# Patient Record
Sex: Female | Born: 1965 | Race: White | Hispanic: No | Smoking: Never smoker
Health system: Southern US, Community
[De-identification: ages and names within clinical notes are randomized; demographics above are authoritative.]

## PROBLEM LIST (undated history)

## (undated) DIAGNOSIS — F419 Anxiety disorder, unspecified: Secondary | ICD-10-CM

## (undated) DIAGNOSIS — K5792 Diverticulitis of intestine, part unspecified, without perforation or abscess without bleeding: Secondary | ICD-10-CM

## (undated) DIAGNOSIS — J189 Pneumonia, unspecified organism: Secondary | ICD-10-CM

## (undated) DIAGNOSIS — T7840XA Allergy, unspecified, initial encounter: Secondary | ICD-10-CM

## (undated) DIAGNOSIS — R0602 Shortness of breath: Secondary | ICD-10-CM

## (undated) DIAGNOSIS — E119 Type 2 diabetes mellitus without complications: Secondary | ICD-10-CM

## (undated) DIAGNOSIS — Z8719 Personal history of other diseases of the digestive system: Secondary | ICD-10-CM

## (undated) DIAGNOSIS — K760 Fatty (change of) liver, not elsewhere classified: Secondary | ICD-10-CM

## (undated) DIAGNOSIS — R0600 Dyspnea, unspecified: Secondary | ICD-10-CM

## (undated) DIAGNOSIS — R030 Elevated blood-pressure reading, without diagnosis of hypertension: Secondary | ICD-10-CM

## (undated) DIAGNOSIS — D649 Anemia, unspecified: Secondary | ICD-10-CM

## (undated) DIAGNOSIS — R609 Edema, unspecified: Secondary | ICD-10-CM

## (undated) DIAGNOSIS — R112 Nausea with vomiting, unspecified: Secondary | ICD-10-CM

## (undated) DIAGNOSIS — M1990 Unspecified osteoarthritis, unspecified site: Secondary | ICD-10-CM

## (undated) DIAGNOSIS — M5136 Other intervertebral disc degeneration, lumbar region: Secondary | ICD-10-CM

## (undated) DIAGNOSIS — M51369 Other intervertebral disc degeneration, lumbar region without mention of lumbar back pain or lower extremity pain: Secondary | ICD-10-CM

## (undated) DIAGNOSIS — I10 Essential (primary) hypertension: Secondary | ICD-10-CM

## (undated) DIAGNOSIS — R519 Headache, unspecified: Secondary | ICD-10-CM

## (undated) DIAGNOSIS — Z8489 Family history of other specified conditions: Secondary | ICD-10-CM

## (undated) DIAGNOSIS — Z9889 Other specified postprocedural states: Secondary | ICD-10-CM

## (undated) DIAGNOSIS — K219 Gastro-esophageal reflux disease without esophagitis: Secondary | ICD-10-CM

## (undated) DIAGNOSIS — T4145XA Adverse effect of unspecified anesthetic, initial encounter: Secondary | ICD-10-CM

## (undated) DIAGNOSIS — E785 Hyperlipidemia, unspecified: Secondary | ICD-10-CM

## (undated) DIAGNOSIS — T8859XA Other complications of anesthesia, initial encounter: Secondary | ICD-10-CM

## (undated) HISTORY — DX: Edema, unspecified: R60.9

## (undated) HISTORY — PX: ABDOMINAL HYSTERECTOMY: SHX81

## (undated) HISTORY — DX: Type 2 diabetes mellitus without complications: E11.9

## (undated) HISTORY — DX: Elevated blood-pressure reading, without diagnosis of hypertension: R03.0

## (undated) HISTORY — DX: Diverticulitis of intestine, part unspecified, without perforation or abscess without bleeding: K57.92

## (undated) HISTORY — DX: Anemia, unspecified: D64.9

## (undated) HISTORY — DX: Unspecified osteoarthritis, unspecified site: M19.90

## (undated) HISTORY — DX: Anxiety disorder, unspecified: F41.9

## (undated) HISTORY — PX: TUBAL LIGATION: SHX77

## (undated) HISTORY — DX: Shortness of breath: R06.02

## (undated) HISTORY — PX: BREAST LUMPECTOMY: SHX2

## (undated) HISTORY — DX: Hyperlipidemia, unspecified: E78.5

## (undated) HISTORY — DX: Allergy, unspecified, initial encounter: T78.40XA

---
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
## 2003-08-17 ENCOUNTER — Emergency Department (HOSPITAL_COMMUNITY): Admission: EM | Admit: 2003-08-17 | Discharge: 2003-08-17 | Payer: Self-pay | Admitting: Emergency Medicine

## 2008-07-02 ENCOUNTER — Emergency Department (HOSPITAL_COMMUNITY): Admission: EM | Admit: 2008-07-02 | Discharge: 2008-07-02 | Payer: Self-pay | Admitting: Emergency Medicine

## 2008-07-08 ENCOUNTER — Ambulatory Visit: Payer: Self-pay | Admitting: Cardiovascular Disease

## 2008-07-28 ENCOUNTER — Ambulatory Visit: Payer: Self-pay

## 2008-07-28 ENCOUNTER — Encounter: Payer: Self-pay | Admitting: Cardiovascular Disease

## 2009-12-30 ENCOUNTER — Telehealth: Payer: Self-pay | Admitting: Cardiovascular Disease

## 2010-01-04 ENCOUNTER — Encounter: Payer: Self-pay | Admitting: Cardiovascular Disease

## 2010-01-24 ENCOUNTER — Encounter: Payer: Self-pay | Admitting: Physician Assistant

## 2010-01-24 ENCOUNTER — Ambulatory Visit: Payer: Self-pay | Admitting: Cardiology

## 2010-01-24 DIAGNOSIS — R079 Chest pain, unspecified: Secondary | ICD-10-CM

## 2010-01-24 DIAGNOSIS — R0600 Dyspnea, unspecified: Secondary | ICD-10-CM | POA: Insufficient documentation

## 2010-01-24 DIAGNOSIS — R0789 Other chest pain: Secondary | ICD-10-CM | POA: Insufficient documentation

## 2010-01-24 DIAGNOSIS — R609 Edema, unspecified: Secondary | ICD-10-CM | POA: Insufficient documentation

## 2010-01-24 DIAGNOSIS — R0609 Other forms of dyspnea: Secondary | ICD-10-CM | POA: Insufficient documentation

## 2010-01-24 DIAGNOSIS — R0602 Shortness of breath: Secondary | ICD-10-CM

## 2010-01-26 ENCOUNTER — Telehealth (INDEPENDENT_AMBULATORY_CARE_PROVIDER_SITE_OTHER): Payer: Self-pay | Admitting: *Deleted

## 2010-03-07 ENCOUNTER — Ambulatory Visit: Payer: Self-pay

## 2010-03-07 ENCOUNTER — Ambulatory Visit (HOSPITAL_COMMUNITY): Admission: RE | Admit: 2010-03-07 | Discharge: 2010-03-07 | Payer: Self-pay | Admitting: Cardiovascular Disease

## 2010-03-07 ENCOUNTER — Ambulatory Visit: Payer: Self-pay | Admitting: Cardiology

## 2010-03-08 ENCOUNTER — Encounter: Payer: Self-pay | Admitting: Cardiology

## 2010-03-13 ENCOUNTER — Encounter: Payer: Self-pay | Admitting: Pulmonary Disease

## 2010-03-16 ENCOUNTER — Ambulatory Visit: Payer: Self-pay | Admitting: Cardiovascular Disease

## 2010-03-27 ENCOUNTER — Encounter: Payer: Self-pay | Admitting: Pulmonary Disease

## 2010-04-02 ENCOUNTER — Encounter: Payer: Self-pay | Admitting: Pulmonary Disease

## 2010-04-17 ENCOUNTER — Emergency Department (HOSPITAL_COMMUNITY): Admission: EM | Admit: 2010-04-17 | Discharge: 2010-04-17 | Payer: Self-pay | Admitting: Emergency Medicine

## 2010-04-18 ENCOUNTER — Ambulatory Visit: Payer: Self-pay | Admitting: Pulmonary Disease

## 2010-04-18 DIAGNOSIS — R05 Cough: Secondary | ICD-10-CM | POA: Insufficient documentation

## 2010-04-18 DIAGNOSIS — R059 Cough, unspecified: Secondary | ICD-10-CM | POA: Insufficient documentation

## 2010-04-27 ENCOUNTER — Encounter: Admission: RE | Admit: 2010-04-27 | Discharge: 2010-04-27 | Payer: Self-pay | Admitting: General Surgery

## 2010-04-27 ENCOUNTER — Ambulatory Visit (HOSPITAL_COMMUNITY): Admission: RE | Admit: 2010-04-27 | Discharge: 2010-04-27 | Payer: Self-pay | Admitting: General Surgery

## 2010-07-11 NOTE — Assessment & Plan Note (Signed)
Summary: cough/jd   Visit Type:  Initial Consult Copy to:  pcp Primary Provider/Referring Provider:  [facility], Dr. Shea Evans  CC:  Pulmonary consult. Dx with PNA in Sept 2011.  History of Present Illness: [name] , never smoker for evaluation of sub acute cough since sep'11. Started with fever malaise in sep '11 , treated for pneumonia sep 6 /11 D- dimer pos, CT angio neg [facility]  Sep 14 th - short course of steroids  CXR 10/3 /11 LLL infiltrate that resolved on CXR 03/27/10 after taking doxy, levaquin -I also reviewed CXR on ER visit 11/7 /11 which is clear. c/o pain in interscapular area from incessant coughing, c/o substernal & epigastric pain radiating to lUE , ER visit 11/7 - enzymes neg, recent stres test neg (dr Eden Emms)- Asked to tak eprilosec & zantac. PPD neg 03/19/10 Hycodan syrup helps, but keeps her sleepy. Tessalo has not helped. Advair two times a day - no differnece, pro- air makes her cough Rpt spirometry 03/27/10 showed mild restriction FVC 77%, FEv1 78%, no obstruction  - improved form 03/13/10 Is scheduled for breast biopsy in 2 weeks  Preventive Screening-Counseling & Management  Alcohol-Tobacco     Alcohol drinks/day: 0     Smoking Status: never  Current Medications (verified): 1)  Hydrochlorothiazide 25 Mg Tabs (Hydrochlorothiazide) .... Take One  Tablet By Mouth Daily 2)  Alprazolam 0.25 Mg Tabs (Alprazolam) .... Take 1/2 Tablet Daily As Needed 3)  Advair Hfa 230-21 Mcg/act Aero (Fluticasone-Salmeterol) .... As Directed 4)  Albuterol Sulfate (2.5 Mg/68ml) 0.083% Nebu (Albuterol Sulfate) .... As Directed 5)  Cyclobenzaprine Hcl 5 Mg Tabs (Cyclobenzaprine Hcl) .Marland Kitchen.. 1 Tab By Mouth Three Times A Day As Needed 6)  Advil 200 Mg Tabs (Ibuprofen) .... As Needed  Allergies (verified): No Known Drug Allergies  Past History:  Past Medical History: Last updated: 03/16/2010 CHEST PAIN SHORTNESS OF BREATH EDEMA    Past medical history is remarkable for  previous history of chest pain,   some borderline hypertension, lower extremity edema on diuretics,   headaches.   Past Surgical History: Last updated: 01/19/2010  Previous surgeries have included tubal ligation and 3 lumps removed, two   from the right breast and one from the left.  [name].   Social History: she is currently working in a position that it will be eliminated in October. She is a nonsmoker. Marital Status: Divorced Children: yes 3 Occupation: Conservator, museum/gallery for [employer] Patient never smoked.  Smoking Status:  never Alcohol drinks/day:  0  Review of Systems       The patient complains of shortness of breath with activity, productive cough, non-productive cough, chest pain, acid heartburn, indigestion, weight change, abdominal pain, tooth/dental problems, headaches, anxiety, hand/feet swelling, joint stiffness or pain, and change in color of mucus.  The patient denies shortness of breath at rest, coughing up blood, irregular heartbeats, loss of appetite, difficulty swallowing, sore throat, nasal congestion/difficulty breathing through nose, sneezing, itching, ear ache, depression, rash, and fever.    Vital Signs:  Patient profile:   45 year old female Height:      67 inches Weight:      201.75 pounds O2 Sat:      98 % on Room air Temp:     98.6 degrees F oral Pulse rate:   96 / minute BP sitting:   100 / 70  (left arm) Cuff size:   large  Vitals  Entered By: Zackery Barefoot CMA (April 18, 2010 3:40 PM)  O2 Flow:  Room air CC: Pulmonary consult. Dx with PNA in Sept 2011 Comments Medications reviewed with patient Verified contact number and pharmacy with patient Zackery Barefoot CMA  April 18, 2010 3:50 PM    Physical Exam  Additional Exam:  Gen. Pleasant, well-nourished, in no distress, normal affect ENT - no lesions, no post nasal drip Neck: No JVD, no thyromegaly, no carotid bruits Lungs: no use of  accessory muscles, no dullness to percussion, coarse BS without rales or rhonchi  Cardiovascular: Rhythm regular, heart sounds  normal, no murmurs or gallops, no peripheral edema Abdomen: soft and non-tender, no hepatosplenomegaly, BS normal. Musculoskeletal: No deformities, no cyanosis or clubbing Neuro:  alert, non focal     Impression & Recommendations:  Problem # 1:  COUGH (ICD-786.2)  Post bronchitic/ pneumonic Expect to resolve within 4-6 wks Symptomatic suppression - Take hydrocodone syrup at bedtime , DELSYM daytime ( DM containing syrup) Tessalon ok Pro-air pump as needed for wheezing OK to stop advair  short course of prednisone Take prilosec once daily x 6 wks to suppress GERD  Orders: Consultation Level III (16109) Prescription Created Electronically [phone])  Medications Added to Medication List This Visit: 1)  Alprazolam 0.25 Mg Tabs (Alprazolam) .... Take 1/2 tablet daily as needed 2)  Advil 200 Mg Tabs (Ibuprofen) .... As needed 3)  Prednisone 5 Mg Tabs (Prednisone) .... Take 2 tabs once daily x 5 ds, then 1 tab once daily x 5  Other Orders: Admin 1st Vaccine (09811) Flu Vaccine 8yrs + (91478)  Patient Instructions: 1)  Copy sent to: Dr Shea Evans [facility] [place] [facility] 2)  Take hydrocodone syrup at bedtime , DELSYM daytime ( DM containing syrup) 3)  Tessalon ok 4)  Pro-air pump as needed for wheezing 5)  OK to stop advair 6)   short course of prednisone 7)  Take prilosec once daily x 6 wks 8)  Please schedule a follow-up appointment as needed. Prescriptions: PREDNISONE 5 MG TABS (PREDNISONE) Take 2 tabs once daily x 5 ds, then 1 tab once daily x 5  #15 x 0   Entered and Authorized by:   Comer Locket Vassie Loll MD   Signed by:   Comer Locket Vassie Loll MD on 04/18/2010   Method used:   Print then Give to Patient   RxID:   [id number]                  Flu Vaccine Consent Questions     Do you have a history of severe  allergic reactions to this vaccine? no    Any prior history of allergic reactions to egg and/or gelatin? no    Do you have a sensitivity to the preservative Thimersol? no    Do you have a past history of Guillan-Barre Syndrome? no    Do you currently have an acute febrile illness? no    Have you ever had a severe reaction to latex? no    Vaccine information given and explained to patient? yes    Are you currently pregnant? no    Lot Number:[id number]   Exp Date:12/09/2010   Site Given  Left Deltoid IMflu1 Zackery Barefoot CMA  April 18, 2010 5:07 PM

## 2010-07-11 NOTE — Progress Notes (Signed)
Summary: c/o edema in both legs, sob  Phone Note Call from Patient Call back at Home Phone [phone] Call back at [phone]   Caller: Patient Reason for Call: Talk to Nurse Summary of Call: per pt calling, c/o edema in both legs, right leg is worse. sob. pt asking could she be seen today if possible since pn is not in office today.  Initial call taken by: Lorne Skeens,  December 30, 2009 8:51 AM  Follow-up for Phone Call        Spoke with pt. Patient c/o of increased swelling on lower extremities for the last 2 days. Before, she had some edema which went down after elevaling her feet in the afternoon after work. Now, the swelling does not go down and she push  down with her finges the indentation stays for a while. She also c/o of sob with and without activity. Also some tightness of chest which is not new.Pt. states has gone to the hospital for the chest tightness was given NTG it did not help because it was not heart related. Pt states is not taken any prescrived medication. Pt. only takes Advil or tylenol as needed.  Ollen Gross, RN, BSN  December 30, 2009 9:31 AM  Dr. Daleen Squibb DOD recommeds to start pt. on HCTZ 12.5 mg daily and to have a potassium rich diet. Pt. to be f/u by primary cardiologist. [facility] [place]. PT. has an appointment with the PA on August 16th at 9:15 AM. Pt aware of MD's recommendations and appointment.    Follow-up by: Ollen Gross, RN, BSN,  December 30, 2009 10:17 AM  Additional Follow-up for Phone Call Additional follow up Details #1::         Reviewed Juanito Doom, MD  Additional Follow-up by: Gaylord Shih, MD, [facility],  December 30, 2009 1:16 PM    New/Updated Medications: HYDROCHLOROTHIAZIDE 25 MG TABS (HYDROCHLOROTHIAZIDE) Take 1/2 tablet by mouth daily Prescriptions: HYDROCHLOROTHIAZIDE 25 MG TABS (HYDROCHLOROTHIAZIDE) Take 1/2 tablet by mouth daily  #15 x 5   Entered by:   Ollen Gross, RN, BSN   Authorized by:   Gaylord Shih,  MD, [facility]   Signed by:   Ollen Gross, RN, BSN on 12/30/2009   Method used:   Faxed to ...       [facility] (retail[facility]       [address]       [place], [place]  [address]       Ph: [phone]       Fax: [phone]   RxID:   [id number]   Appended Document: c/o edema in both legs, sob  Reviewed Juanito Doom, MD  Appended Document: c/o edema in both legs, sob Dr Chilton Si from Urgent Care call today reporting that the patient was having continued shortness of breath and chest discomfort. It does not sound anginal. Chest x-ray and EKG are normal. Cardiac marker is pending. D-dimer is pending. She has lost about 8 pounds of fluid with the HCTZ.  I advised him to check cardiac markers since she's had constant pain for several days. He will also check a d-dimer. We have her scheduled for followup in the office as mentioned above.

## 2010-07-11 NOTE — Progress Notes (Signed)
  ROI faxed to Urgent [facility] [place], Records recieved back today gave to Hosie Poisson Mesiemore  January 26, 2010 2:34 PM     Appended Document:  gave Records to [facility]

## 2010-07-11 NOTE — Assessment & Plan Note (Signed)
Summary: per check out/having stress echo @ 2pm/saf   CC:  dizziness and sob pt is being treated for pnemonia so symptoms may be related to that.  History of Present Illness: Angelica Sharp was seen by our PA Jacolyn Reedy recently.  I last saw her in 2010 for atypical pain with negative w/U.  She just had a normal stress echo that I reviewed.  She has had chronic edema that she takes a diuretic for.  She has had a recent URI with asthma like flare.  This seems to be improveing with albuterol and antibiotic Rx.  She has cut back on her salt intake and this has helped her edema a lot.  She kept her job at [employer] but now is in [employer].  Her daughter had her baby 3 weeks early but all are doing well and this is a big relief for her as well  Current Problems (verified): 1)  Chest Pain  (ICD-786.50) 2)  Shortness of Breath  (ICD-786.05) 3)  Edema  (ICD-782.3)  Current Medications (verified): 1)  Hydrochlorothiazide 25 Mg Tabs (Hydrochlorothiazide) .... Take One  Tablet By Mouth Daily 2)  Alprazolam 0.25 Mg Tabs (Alprazolam) .... Take 1/2 Tablet Daily 3)  Advair Hfa 230-21 Mcg/act Aero (Fluticasone-Salmeterol) .... As Directed 4)  Albuterol Sulfate (2.5 Mg/55ml) 0.083% Nebu (Albuterol Sulfate) .... As Directed 5)  Meloxicam 7.5 Mg Tabs (Meloxicam) .Marland Kitchen.. 1 Tab By Mouth Two Times A Day 6)  Cyclobenzaprine Hcl 5 Mg Tabs (Cyclobenzaprine Hcl) .Marland Kitchen.. 1 Tab By Mouth Three Times A Day As Needed  Allergies (verified): No Known Drug Allergies  Past History:  Past Medical History: Last updated: 03/16/2010 CHEST PAIN SHORTNESS OF BREATH EDEMA    Past medical history is remarkable for previous history of chest pain,   some borderline hypertension, lower extremity edema on diuretics,   headaches.   Past Surgical History: Last updated: 01/19/2010  Previous surgeries have included tubal ligation and 3 lumps removed, two   from the right breast and one from the left.  She is due to have  a   followup mammogram in March.   Family History: Last updated: 01/24/2010 Mother had congestive heart failure and died of this she was also diabetic Sister has congestive heart failure and diabetes  Social History: Last updated: 01/24/2010 she is currently working in a position that it will be eliminated in October. She is a nonsmoker.  Review of Systems       Denies fever, malais, weight loss, blurry vision, decreased visual acuity, cough, sputum, SOB, hemoptysis, pleuritic pain, palpitaitons, heartburn, abdominal pain, melena, lower extremity edema, claudication, or rash.   Vital Signs:  Patient profile:   45 year old female Height:      67 inches Weight:      205 pounds BMI:     32.22 Pulse rate:   80 / minute Resp:     18 per minute BP sitting:   102 / 60  (left arm)  Vitals Entered By: Kem Parkinson (March 16, 2010 11:56 AM)  Physical Exam  General:  Affect appropriate Healthy:  appears stated age HEENT: normal Neck supple with no adenopathy JVP normal no bruits no thyromegaly Lungs clear with no wheezing and good diaphragmatic motion Heart:  S1/S2 no murmur,rub, gallop or click PMI normal Abdomen: benighn, BS positve, no tenderness, no AAA no bruit.  No HSM or HJR Distal pulses intact with no bruits No edema Neuro non-focal Skin warm and dry  Impression & Recommendations:  Problem # 1:  CHEST PAIN (ICD-786.50) Atypical with normal stress echo.  No need for further w/u  Problem # 2:  SHORTNESS OF BREATH (ICD-786.05) URI with asthmatic bronchitis  F/U primary Her updated medication list for this problem includes:    Hydrochlorothiazide 25 Mg Tabs (Hydrochlorothiazide) .Marland Kitchen... Take one  tablet by mouth daily  Problem # 3:  EDEMA (ICD-782.3) Cotninue low sodium diet and low dose diuretic.  Patient Instructions: 1)  Your physician recommends that you schedule a follow-up appointment in: YEAR WITH DR Eden Emms 2)  Your physician recommends that you  continue on your current medications as directed. Please refer to the Current Medication list given to you today.

## 2010-07-11 NOTE — Assessment & Plan Note (Signed)
Summary: Increase pitting edema in LE, sob./nm   Visit Type:  follow up  CC:  Edema and sob.  History of Present Illness: This is a 45 year old female patient who went to urgent care with constant chest tightness. We're trying to obtain the records but do not have them. The patient states that she awakened with chest tightness that was constant and one call way. She says they did cardiac enzymes that were negative and she was given Xanax for stress. The patient also complains of dyspnea on exertion when she walks 3 miles a week. She is no history of hypertension diabetes and is a nonsmoker. Her mother and sister both had congestive heart failure and diabetes.  The patient says a lot of her chest pain may be stress related. She is going to lose her job in October and her daughter is having premature labor. She is feeling a little bit better on the Xanax. She was also given hydrochlorothiazide for edema. She says it's not enough and she continues to have swelling in her ankles and abdomen.  Current Medications (verified): 1)  Hydrochlorothiazide 25 Mg Tabs (Hydrochlorothiazide) .... Take 1/2 Tablet By Mouth Daily 2)  Omeprazole 20 Mg Tbec (Omeprazole) .... Take 1 Tablet By Mouth Once A Day 3)  Alprazolam 0.25 Mg Tabs (Alprazolam) .... Take 1/2 Tablet Daily  Allergies (verified): No Known Drug Allergies  Past History:  Past Medical History: Last updated: 01/19/2010  Past medical history is remarkable for previous history of chest pain,   some borderline hypertension, lower extremity edema on diuretics,   headaches.   Family History: Mother had congestive heart failure and died of this she was also diabetic Sister has congestive heart failure and diabetes  Social History: she is currently working in a position that it will be eliminated in October. She is a nonsmoker.  Review of Systems       see history of present illness  Vital Signs:  Patient profile:   45 year old  female Height:      67 inches Weight:      206 pounds BMI:     32.38 Pulse rate:   69 / minute Pulse rhythm:   regular Resp:     18 per minute BP sitting:   112 / 72  (left arm) Cuff size:   large  Vitals Entered By: Vikki Ports (January 24, 2010 10:04 AM)  Physical Exam  General:   Well-nournished, in no acute distress. Neck: No JVD, HJR, Bruit, or thyroid enlargement Lungs: No tachypnea, clear without wheezing, rales, or rhonchi Cardiovascular: RRR, PMI not displaced, heart sounds normal, no murmurs, gallops, bruit, thrill, or heave. Abdomen: BS normal. Soft without organomegaly, masses, lesions or tenderness. Extremities: Trace of bilateral ankle edema, lower extremities without cyanosis, clubbing. Good distal pulses bilateral SKin: Warm, no lesions or rashes  Musculoskeletal: No deformities Neuro: no focal signs    EKG  Procedure date:  01/24/2010  Findings:      normal sinus rhythm with LVH  Impression & Recommendations:  Problem # 1:  CHEST PAIN (ICD-786.50) Patient has symptoms of atypical chest pain but does have some cardiac risk factors. She's had a prior stress echo in our office in the past which was normal in 2010. We will repeat this. Orders: Stress Echo (Stress Echo)  Problem # 2:  EDEMA (ICD-782.3) she has some ankle edema. I will increase her hydrochlorothiazide to 25 mg daily. I've also asked her to follow 2 g sodium diet. We  will check a 2-D echo to rule out diastolic dysfunction. Orders: EKG w/ Interpretation (93000)  Problem # 3:  SHORTNESS OF BREATH (ICD-786.05) patient has dyspnea on exertion. Will check a stress echo. Her updated medication list for this problem includes:    Hydrochlorothiazide 25 Mg Tabs (Hydrochlorothiazide) .Marland Kitchen... Take one  tablet by mouth daily  Orders: EKG w/ Interpretation (93000) Stress Echo (Stress Echo)  Patient Instructions: 1)  Your physician recommends that you schedule a follow-up appointment in: 1 month with  Dr. Eden Emms 2)  Your physician has requested that you have a stress echocardiogram. For further information please visit [email or web address]  Please follow instruction sheet as given. 3)  Your physician has requested that you limit the intake of sodium (salt) in your diet to two grams daily. Please see [facility] handout. 4)  Your physician has recommended you make the following change in your medication: HCTZ 25 mg take one tablet daily Prescriptions: HYDROCHLOROTHIAZIDE 25 MG TABS (HYDROCHLOROTHIAZIDE) Take one  tablet by mouth daily  #30 x 6   Entered by:   Ollen Gross, RN, BSN   Authorized by:   Marletta Lor, PA-C   Signed by:   Ollen Gross, RN, BSN on 01/24/2010   Method used:   Print then Give to Patient   RxID:   [id number]

## 2010-07-11 NOTE — Letter (Signed)
Summary: [facility]  [facility]   Imported By: Marylou Mccoy 03/16/2010 15:15:24  _____________________________________________________________________  External Attachment:    Type:   Image     Comment:   External Document

## 2010-08-22 LAB — SURGICAL PCR SCREEN
MRSA, PCR: NEGATIVE
Staphylococcus aureus: NEGATIVE

## 2010-08-22 LAB — DIFFERENTIAL
Basophils Relative: 1 % (ref 0–1)
Lymphocytes Relative: 17 % (ref 12–46)
Lymphs Abs: 1.9 10*3/uL (ref 0.7–4.0)
Monocytes Relative: 5 % (ref 3–12)
Monocytes Relative: 6 % (ref 3–12)
Neutro Abs: 6.7 10*3/uL (ref 1.7–7.7)
Neutro Abs: 8.3 10*3/uL — ABNORMAL HIGH (ref 1.7–7.7)
Neutrophils Relative %: 66 % (ref 43–77)
Neutrophils Relative %: 76 % (ref 43–77)

## 2010-08-22 LAB — BASIC METABOLIC PANEL
Calcium: 8.9 mg/dL (ref 8.4–10.5)
Calcium: 9.2 mg/dL (ref 8.4–10.5)
GFR calc Af Amer: >60 mL/min (ref >60)
GFR calc Af Amer: >60 mL/min (ref >60)
GFR calc non Af Amer: >60 mL/min (ref >60)
GFR calc non Af Amer: >60 mL/min (ref >60)
Glucose, Bld: 87 mg/dL (ref 70–99)
Sodium: 136 mEq/L (ref 135–145)
Sodium: 137 mEq/L (ref 135–145)

## 2010-08-22 LAB — POCT CARDIAC MARKERS
CKMB, poc: <1 ng/mL — ABNORMAL LOW (ref 1.0–8.0)
Myoglobin, poc: 61.1 ng/mL (ref 12–200)

## 2010-08-22 LAB — CBC
Hemoglobin: 13.4 g/dL (ref 12.0–15.0)
Hemoglobin: 14.2 g/dL (ref 12.0–15.0)
MCHC: 32.8 g/dL (ref 30.0–36.0)
RBC: 4.82 MIL/uL (ref 3.87–5.11)
RBC: 5.03 MIL/uL (ref 3.87–5.11)

## 2010-09-25 LAB — CBC
HCT: 39.4 % (ref 36.0–46.0)
Platelets: 355 10*3/uL (ref 150–400)
WBC: 12 10*3/uL — ABNORMAL HIGH (ref 4.0–10.5)

## 2010-09-25 LAB — DIFFERENTIAL
Basophils Absolute: 0.1 10*3/uL (ref 0.0–0.1)
Basophils Relative: 1 % (ref 0–1)
Eosinophils Relative: 1 % (ref 0–5)
Monocytes Absolute: 0.4 10*3/uL (ref 0.1–1.0)

## 2010-09-25 LAB — COMPREHENSIVE METABOLIC PANEL
ALT: 17 U/L (ref 0–35)
AST: 19 U/L (ref 0–37)
Albumin: 3.6 g/dL (ref 3.5–5.2)
Alkaline Phosphatase: 77 U/L (ref 39–117)
Chloride: 105 mEq/L (ref 96–112)
GFR calc Af Amer: >60 mL/min (ref >60)
Potassium: 4 mEq/L (ref 3.5–5.1)
Sodium: 137 mEq/L (ref 135–145)
Total Bilirubin: 0.6 mg/dL (ref 0.3–1.2)

## 2010-09-25 LAB — POCT CARDIAC MARKERS
CKMB, poc: <1 ng/mL — ABNORMAL LOW (ref 1.0–8.0)
Myoglobin, poc: 43 ng/mL (ref 12–200)
Troponin i, poc: <0.05 ng/mL (ref 0.00–0.09)

## 2010-10-24 NOTE — Assessment & Plan Note (Signed)
[facility]                            CARDIOLOGY OFFICE NOTE   Angelica Sharp, Angelica Sharp                      MRN:          [id number]  DATE:07/08/2008                            DOB:          11/28/1965    A 45 year old patient referred from Dr. Denton Lank in the ER for recurrent  chest pain.  The patient has had previous chest pain back in 2003.  She  describes seeing a cardiologist in [facility] and having a negative  stress test.  She has been under a lot of stress lately.  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  The end of the month tends to be a  busy time.  On Friday, she was entering data at her computer and had  substernal chest pain.  It was in the center of her chest.  It was very  strong.  It lasted for a few hours.  There was partial relief with  nitro, GI cocktail, and Vicodin.  She was seen in the emergency room,  ruled out for myocardial infarction, was discharged for outpatient  followup.  She has had recurrent pain since Friday.  It has been  intermittent.  It has been nonexertional.  It has not been as tense.  The pain is described as sharp, sometimes radiating towards the back.  She had a normal chest x-ray in the ER with no mediastinal widening.  She had some pain back in November and had a CT scan at [facility] which showed no PE and no mediastinal abnormalities.   The patient is otherwise fairly sedentary.  She is to be active playing  tennis, but has arthritis in her back and this has limited her.   Her review of systems is remarkable for no significant syncope,  palpitations, diaphoresis, shortness of breath, or lower extremity  edema.   Past medical history is remarkable for previous history of chest pain,  some borderline hypertension, lower extremity edema on diuretics,  headaches.   Blood pressure has been high since 2006.   Previous surgeries have included tubal ligation and 3 lumps removed, two  from the right breast and one from the left.  She is due to have a  followup mammogram in March.   The patient is divorced.  He has 3 children.  Her youngest son is still  living with her.  She has a daughter was going to be married in June and  another daughter who is living with a boyfriend.  She does not  particularly care for.  She spends a lot of time with her 2  grandchildren.  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  She used to play tennis, but activities are now  limited by lower back pain.  She has 2 cups of coffee a day.  She does  not smoke or drink on a regular basis.   Her family history is remarkable for mother still being alive at age 72.  Father is dead, apparently there was some sort of blood clot from his  leg to his  lung, sounds like he had a PE.   Current meds include:  1. Hydrochlorothiazide 12.5 a day.  2. Omeprazole 20 a day.  3. Cyclobenzaprine 10 mg as needed.  4. Motrin p.r.n.   She denies any allergies.   PHYSICAL EXAMINATION:  GENERAL:  Remarkable for an overweight white  female in no distress.  VITAL SIGNS:  Pulse is 87 and regular, weight is equal to 2004, blood  pressure 115/82.  Affect appropriate.  HEENT:  Unremarkable.  NECK:  Carotids are normal without bruit.  No lymphadenopathy,  thyromegaly, or JVP elevation.  LUNGS:  Clear.  Good diaphragmatic motion.  No wheezing.  HEART:  S1 and S2 with heart normal sounds.  PMI normal.  ABDOMEN:  Benign.  Bowel sounds positive.  No AAA.  No tenderness.  No  bruit.  No hepatosplenomegaly, hepatojugular reflux, or tenderness.  EXTREMITIES:  Distal pulses are intact.  No edema.  NEUROLOGIC:  Nonfocal.  SKIN:  Warm and dry.  MUSCULOSKELETAL:  No muscular weakness.   EKG shows sinus rhythm with poor R-wave progression.  LVH in the limb  leads.   IMPRESSION:  1. Atypical chest pain.  Followup stress echo.  2. Left ventricular hypertrophy on EKG with history of  hypertension.      Continue low-dose diuretic.  3. Anxiety and stress.  Consider selective serotonin reuptake      inhibitor therapy.  Followup primary care MD.  It would appear that      some of the issues revolving around her kids and her job      particularly at the end of the month may be causing some somatic      stress-induced symptoms.  4. Arthritis.  Try to use Tylenol or aspirin.  Try to avoid      nonsteroidals in the setting of hypertension.  5. History of reflux.  Continue Metrazol.  Avoid late-night meals and      recumbency on a full stomach.   Further recommendations will be based on the results of her stress echo.  If it is normal, I will see her on an as-needed basis.     Angelica Pick. Eden Emms, MD, [facility]  Electronically Signed    PCN/MedQ  DD: 07/08/2008  DT: 07/08/2008  Job #: [id number]

## 2011-04-04 ENCOUNTER — Other Ambulatory Visit: Payer: Self-pay | Admitting: Emergency Medicine

## 2011-04-04 ENCOUNTER — Ambulatory Visit
Admission: RE | Admit: 2011-04-04 | Discharge: 2011-04-04 | Disposition: A | Discharge status: Home / Self Care | Payer: BC Managed Care – PPO | Source: Ambulatory Visit | Attending: Emergency Medicine | Admitting: Emergency Medicine

## 2011-04-04 MED ORDER — IOHEXOL 300 MG/ML  SOLN
125.0000 mL | Freq: Once | INTRAMUSCULAR | Status: AC | PRN
  Administered 2011-04-04: 125 mL via INTRAVENOUS

## 2011-06-06 ENCOUNTER — Telehealth: Payer: Self-pay | Admitting: Cardiovascular Disease

## 2011-06-06 NOTE — Telephone Encounter (Signed)
Patient called, stating she woke up at 4:30 am with chest pressure,pain back of shoulder blades.Stated she took 3 advil with relief.Stated pain lasted until 6:30 am.Stated chest feels sore now.Spoke with DOD Dr.Cooper he advised to make appointment with Dr. Eden Emms and if pain continues to go to ER.Appointment scheduled with Dr.Nishan 07/03/11 at 11:00 am.

## 2011-06-06 NOTE — Telephone Encounter (Signed)
New problem:  C/O chest pain & shoulder blade pain around 4:30 am . Took 3 Advil .

## 2011-06-28 ENCOUNTER — Encounter: Payer: Self-pay | Admitting: *Deleted

## 2011-07-03 ENCOUNTER — Encounter: Payer: Self-pay | Admitting: Cardiovascular Disease

## 2011-07-03 ENCOUNTER — Ambulatory Visit (INDEPENDENT_AMBULATORY_CARE_PROVIDER_SITE_OTHER): Payer: No Typology Code available for payment source | Admitting: Cardiovascular Disease

## 2011-07-03 DIAGNOSIS — R05 Cough: Secondary | ICD-10-CM

## 2011-07-03 DIAGNOSIS — K219 Gastro-esophageal reflux disease without esophagitis: Secondary | ICD-10-CM | POA: Insufficient documentation

## 2011-07-03 DIAGNOSIS — R059 Cough, unspecified: Secondary | ICD-10-CM

## 2011-07-03 DIAGNOSIS — R609 Edema, unspecified: Secondary | ICD-10-CM

## 2011-07-03 DIAGNOSIS — R079 Chest pain, unspecified: Secondary | ICD-10-CM

## 2011-07-03 NOTE — Assessment & Plan Note (Signed)
Non toxix  Likely viral.  Robitusin Otc.  F/U primary Consider Zpack if symptoms persist

## 2011-07-03 NOTE — Patient Instructions (Signed)
Your physician wants you to follow-up in: 1 year. You will receive a reminder letter in the mail two months in advance. If you don't receive a letter, please call our office to schedule the follow-up appointment.  

## 2011-07-03 NOTE — Assessment & Plan Note (Signed)
Continue H2 blocker and low carb diet with weight loss attempt

## 2011-07-03 NOTE — Assessment & Plan Note (Signed)
Stable none on exam today  Continue current dose of diuretic

## 2011-07-03 NOTE — Progress Notes (Signed)
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  Normal stress echo 9/11.  Stress with son being in some legal issues.  New job doing Contractor.  Daughter did have her baby and everyone is healthy.  Has had a cold last few days with cough.  Atypical pains in shoulder and chest Nonexertional.  No pleuritic component.  No palpitations or syncope.  No fever.  Pains intermitant fleeting and not associated with diaphoresis or dyspnea  ROS: Denies fever, malais, weight loss, blurry vision, decreased visual acuity, cough, sputum, SOB, hemoptysis, pleuritic pain, palpitaitons, heartburn, abdominal pain, melena, lower extremity edema, claudication, or rash.  All other systems reviewed and negative  General: Affect appropriate Healthy:  appears stated age HEENT: normal Neck supple with no adenopathy JVP normal no bruits no thyromegaly Lungs clear with no wheezing and good diaphragmatic motion Heart:  S1/S2 no murmur,rub, gallop or click PMI normal Abdomen: benighn, BS positve, no tenderness, no AAA no bruit.  No HSM or HJR Distal pulses intact with no bruits No edema Neuro non-focal Skin warm and dry No muscular weakness   Current Outpatient Prescriptions  Medication Sig Dispense Refill  . ALPRAZolam (XANAX) 0.25 MG tablet Take 0.125 mg by mouth at bedtime as needed.      . calcium carbonate 200 MG capsule TWICE WEEKLY      . Cyanocobalamin (VITAMIN B-12 ER PO) TWICE WEEKLY      . Docusate Calcium (STOOL SOFTENER PO) Take by mouth as needed.      . hydrochlorothiazide (HYDRODIURIL) 25 MG tablet Take 25 mg by mouth daily.      Marland Kitchen ibuprofen (ADVIL,MOTRIN) 200 MG tablet Take 200 mg by mouth every 6 (six) hours as needed.      . Multiple Vitamin (MULTIVITAMIN) capsule Take 1 capsule by mouth daily.      Marland Kitchen omeprazole (PRILOSEC) 40 MG capsule Take 1 tablet by mouth Daily.      . ranitidine (ZANTAC) 150 MG capsule Take 150 mg by mouth every evening.      Marland Kitchen VITAMIN D, CHOLECALCIFEROL, PO TWICE WEEKLY         Allergies  Review of patient's allergies indicates no known allergies.  Electrocardiogram:  NSR normal ECG rate 80  Assessment and Plan

## 2011-07-03 NOTE — Assessment & Plan Note (Signed)
Atypical normal ecg and normla stress echo 9/11  Observe  Likely related to stress PRN Xanax

## 2011-07-10 ENCOUNTER — Other Ambulatory Visit: Payer: Self-pay | Admitting: Physician Assistant

## 2011-07-10 DIAGNOSIS — G4700 Insomnia, unspecified: Secondary | ICD-10-CM

## 2011-07-10 MED ORDER — ALPRAZOLAM 0.25 MG PO TABS: 0.2500 mg | ORAL_TABLET | Freq: Every evening | ORAL | Status: DC | PRN

## 2011-07-27 ENCOUNTER — Ambulatory Visit: Payer: Self-pay | Admitting: Family Medicine

## 2011-08-27 ENCOUNTER — Ambulatory Visit: Payer: No Typology Code available for payment source

## 2011-08-27 ENCOUNTER — Ambulatory Visit (INDEPENDENT_AMBULATORY_CARE_PROVIDER_SITE_OTHER): Payer: No Typology Code available for payment source | Admitting: Family Medicine

## 2011-08-27 DIAGNOSIS — IMO0002 Reserved for concepts with insufficient information to code with codable children: Secondary | ICD-10-CM

## 2011-08-27 DIAGNOSIS — Z8719 Personal history of other diseases of the digestive system: Secondary | ICD-10-CM

## 2011-08-27 DIAGNOSIS — K219 Gastro-esophageal reflux disease without esophagitis: Secondary | ICD-10-CM

## 2011-08-27 DIAGNOSIS — R079 Chest pain, unspecified: Secondary | ICD-10-CM

## 2011-08-27 DIAGNOSIS — M542 Cervicalgia: Secondary | ICD-10-CM

## 2011-08-27 MED ORDER — ASPIRIN 81 MG PO CHEW: 324.0000 mg | CHEWABLE_TABLET | Freq: Once | ORAL | Status: DC

## 2011-08-27 MED ORDER — CYCLOBENZAPRINE HCL 10 MG PO TABS: 10.0000 mg | ORAL_TABLET | Freq: Two times a day (BID) | ORAL | Status: DC | PRN

## 2011-08-27 MED ORDER — OMEPRAZOLE 40 MG PO CPDR: 40.0000 mg | DELAYED_RELEASE_CAPSULE | Freq: Every day | ORAL | Status: DC

## 2011-08-27 NOTE — Progress Notes (Signed)
Patient Name: Angelica Sharp Date of Birth: Oct 31, 1965 Medical Record Number: [id number] Gender: female Date of Encounter: 08/27/2011  History of Present Illness:  Angelica Sharp is a 46 y.o. very pleasant female patient who presents with the following:  Today is Monday.  Last Tuesday she fell in her garage- tripped on something and fell.  Fell onto her right side, but that night she noted that her left arm was hurting.  This improved after a day or so.  However, last night she noted that the back of her neck was sore- she used tylenol and heat- this did not really help. This am when she awoke she noted left sided CP- the seemed to get worse when she took a shower.  She first noted the pain when she woke up around 5:30 or 6am. She also noted SOB- however this is now improved.  She was given aspirin right away  She has not noted any exertional CP in the past with stairs, walking, etc  She has a history of HTN but no other cadiac problems- pt of Dr. Jasper Loser at [facility].  Last normal stress test 02/2010.  She has a history of atypical CP that has been attributed to anxiety in the past.  However, she does not feel that she is currently suffering from anxiety- things are "the best they have been in a while" for her now.    Positive family history- brother with stent in his 61's, mother and sister both died of heart/ DM issues/ CHF.    Never a smoker.  Still has some CP now- but no nausea, no jaw pain, no sweating.   LMP- just ended.    She also notes that her eyes appeared puffy today- this is really what brought her in because she was concerned that this meant she had heart failure  Patient Active Problem List  Diagnoses  . EDEMA  . SHORTNESS OF BREATH  . COUGH  . CHEST PAIN  . GERD (gastroesophageal reflux disease)   Past Medical History  Diagnosis Date  . Chest pain   . SOB (shortness of breath)   . Edema     lower extremity  . Borderline hypertension    Past Surgical  History  Procedure Date  . Tubal ligation   . Breast lumpectomy     3 lumps -- two from right -- one from left   History  Substance Use Topics  . Smoking status: Never Smoker   . Smokeless tobacco: Not on file  . Alcohol Use:    Family History  Problem Relation Age of Onset  . Heart attack Mother   . Diabetes Mother   . Heart attack Sister   . Diabetes Sister    No Known Allergies  Medication list has been reviewed and updated.  Review of Systems: As per HPI- otherwise negative.   Physical Examination: There were no vitals filed for this visit.  There is no height or weight on file to calculate BMI.  Recheck 130/84, pulse 80 GEN: WDWN, NAD, Non-toxic, A & O x 3, overweight HEENT: Atraumatic, Normocephalic. Neck supple. No masses, No LAD.  No visible periorbital edema, TM and oropharynx wnl Ears and Nose: No external deformity. CV: RRR, No M/G/R. No JVD. No thrill. No extra heart sounds. PULM: CTA B, no wheezes, crackles, rhonchi. No retractions. No resp. distress. No accessory muscle use. ABD: S, NT, ND, +BS. No rebound. No HSM. EXTR: No c/c/e NEURO Normal gait.  PSYCH: Normally  interactive. Conversant. Not depressed or anxious appearing.  Calm demeanor.  Chest wall: no lesions, redness or swelling.  She is quite tender with compression over her left costochondral joints, and over her left trapezius muscle.  She also has some discomfort with ROM of her left shoulder.   Cervical spine: no bony ttp, full ROM.  Some tenderness of perispinous muscles.    EKG: NSR, no ST elevation or depression.  Virtually identical to past EKG tracings  [facility] reading (PRIMARY) by  Dr. Patsy Lager.  Negative C spine (except mild degen change), negative chest CHEST - 2 VIEW  Comparison: None  Findings: Normal heart size, mediastinal contours, and pulmonary vascularity. Lungs clear. Bones unremarkable. No pneumothorax.  IMPRESSION: Normal exam.  Assessment and Plan: 1. Chest pain  DG  Chest 2 View, aspirin chewable tablet 324 mg, cyclobenzaprine (FLEXERIL) 10 MG tablet  2. Neck pain  DG Cervical Spine Complete  3. Hx of gastroesophageal reflux (GERD)  omeprazole (PRILOSEC) 40 MG capsule   Likely MSK CP.  Discussed in detail with patient.  The fact that her CP is reproducable, and that her EKG is normal and her VS are normal are very reassuring. I explained that I cannot totally rule- out a cardiac event here in the office however, and offered to have her transported to the ED for a CP rule- out.  Angelica Sharp declined to do this at this time, but did agree that she would seek help asap if her symptoms worsen or do not get better, or if they change.  For the time being will treat with flexeril for presumed muscle pain.  Also refilled her prilosec per her request and did films of her cervical spine due to her concern of "whiplash" from her fall- these looked ok as well  addnd 3/19:  Called to check on patient- she is doing better.  SOB better, her CP is decreased although she does still note CP when she moves her left arm.  The muscles in her back and neck are still sore.  She will let us know if she is not continuing to improve.

## 2011-08-28 ENCOUNTER — Encounter: Payer: Self-pay | Admitting: Family Medicine

## 2011-08-29 ENCOUNTER — Encounter (HOSPITAL_COMMUNITY): Payer: Self-pay | Admitting: *Deleted

## 2011-08-29 ENCOUNTER — Emergency Department (HOSPITAL_COMMUNITY)
Admission: EM | Admit: 2011-08-29 | Discharge: 2011-08-29 | Disposition: A | Discharge status: Home / Self Care | Payer: No Typology Code available for payment source | Attending: Emergency Medicine | Admitting: Emergency Medicine

## 2011-08-29 ENCOUNTER — Other Ambulatory Visit: Payer: Self-pay

## 2011-08-29 DIAGNOSIS — R079 Chest pain, unspecified: Secondary | ICD-10-CM | POA: Insufficient documentation

## 2011-08-29 DIAGNOSIS — Z79899 Other long term (current) drug therapy: Secondary | ICD-10-CM | POA: Insufficient documentation

## 2011-08-29 DIAGNOSIS — R0602 Shortness of breath: Secondary | ICD-10-CM | POA: Insufficient documentation

## 2011-08-29 DIAGNOSIS — R110 Nausea: Secondary | ICD-10-CM | POA: Insufficient documentation

## 2011-08-29 DIAGNOSIS — R634 Abnormal weight loss: Secondary | ICD-10-CM | POA: Insufficient documentation

## 2011-08-29 DIAGNOSIS — R0609 Other forms of dyspnea: Secondary | ICD-10-CM | POA: Insufficient documentation

## 2011-08-29 DIAGNOSIS — W19XXXA Unspecified fall, initial encounter: Secondary | ICD-10-CM | POA: Insufficient documentation

## 2011-08-29 DIAGNOSIS — R059 Cough, unspecified: Secondary | ICD-10-CM | POA: Insufficient documentation

## 2011-08-29 DIAGNOSIS — R0989 Other specified symptoms and signs involving the circulatory and respiratory systems: Secondary | ICD-10-CM | POA: Insufficient documentation

## 2011-08-29 DIAGNOSIS — M79609 Pain in unspecified limb: Secondary | ICD-10-CM | POA: Insufficient documentation

## 2011-08-29 DIAGNOSIS — R05 Cough: Secondary | ICD-10-CM | POA: Insufficient documentation

## 2011-08-29 DIAGNOSIS — E860 Dehydration: Secondary | ICD-10-CM | POA: Insufficient documentation

## 2011-08-29 LAB — CBC
HCT: 43.6 % (ref 36.0–46.0)
Hemoglobin: 14.6 g/dL (ref 12.0–15.0)
MCH: 27 pg (ref 26.0–34.0)
MCV: 80.7 fL (ref 78.0–100.0)
RBC: 5.4 MIL/uL — ABNORMAL HIGH (ref 3.87–5.11)

## 2011-08-29 LAB — TROPONIN I: Troponin I: <0.3 ng/mL (ref <0.30)

## 2011-08-29 LAB — PROTIME-INR
INR: 1.06 (ref 0.00–1.49)
Prothrombin Time: 14 seconds (ref 11.6–15.2)

## 2011-08-29 LAB — BASIC METABOLIC PANEL
CO2: 26 mEq/L (ref 19–32)
Calcium: 10.4 mg/dL (ref 8.4–10.5)
Creatinine, Ser: 0.86 mg/dL (ref 0.50–1.10)
Glucose, Bld: 101 mg/dL — ABNORMAL HIGH (ref 70–99)

## 2011-08-29 MED ORDER — KETOROLAC TROMETHAMINE 30 MG/ML IJ SOLN
30.0000 mg | Freq: Once | INTRAMUSCULAR | Status: AC
  Administered 2011-08-29: 30 mg via INTRAVENOUS
  Filled 2011-08-29: qty 1

## 2011-08-29 MED ORDER — SODIUM CHLORIDE 0.9 % IV BOLUS (SEPSIS): 1000.0000 mL | Freq: Once | INTRAVENOUS | Status: DC

## 2011-08-29 NOTE — ED Provider Notes (Signed)
History     CSN: [id number]  Arrival date & time 08/29/11  1258   First MD Initiated Contact with Patient 08/29/11 1818      Chief Complaint  Patient presents with  . Shortness of Breath  . Chest Pain    (Consider location/radiation/quality/duration/timing/severity/associated sxs/prior treatment) HPI  Patient relates last week she was in the crotch and she got her foot caught on a truck and she fell onto her right side. She states since then she's had some pain in her left arm. She relates yesterday she started getting left-sided chest tightness and then gets some sharp jabbing pains in her axilla they go into her back. She states she also started having some cough yesterday with clear sputum. She denies any fever, wheezing, but she does feel short of breath and has dyspnea on exertion. She also had some nausea today. She has a history of swelling in her legs and she relates she was having swelling of the weekend and she started taking Lasix 3 days ago and relates she's lost about 7 pounds. She relates a swelling in her legs is gone. She states her legs only hurt when they're very swollen and tight and she's not having pain in her legs now. She relates today when she got out of the shower she was extremely short of breath and that's why she came to the ER.  Patient relates about a year ago she was having shortness of breath and she had elevated d-dimer and she went to [facility] where she had a CT done that was negative for blood clots. She has been evaluated by Dr.NIshan, cardiology and he is for her chest pains and shortness of breath are related to anxiety  PCP urgent care [address]    Past Medical History  Diagnosis Date  . Chest pain   . SOB (shortness of breath)   . Edema     lower extremity  . Borderline hypertension     Past Surgical History  Procedure Date  . Tubal ligation   . Breast lumpectomy     3 lumps -- two from right -- one from left    Family  History  Problem Relation Age of Onset  . Heart attack Mother   . Diabetes Mother   . Heart attack Sister   . Diabetes Sister   CHF MOP Sister cause of death) PE FOP (caused his death)  History  Substance Use Topics  . Smoking status: Never Smoker   . Smokeless tobacco: Not on file  . Alcohol Use:   employed  OB History    Grav Para Term Preterm Abortions TAB SAB Ect Mult Living                  Review of Systems  All other systems reviewed and are negative.    Allergies  Review of patient's allergies indicates no known allergies.  Home Medications   Current Outpatient Rx  Name Route Sig Dispense Refill  . ALPRAZOLAM 0.25 MG PO TABS Oral Take 0.25 mg by mouth at bedtime as needed. For anxiety    . CYCLOBENZAPRINE HCL 10 MG PO TABS Oral Take 10 mg by mouth 2 (two) times daily as needed. For muscle spasms.    . DOCUSATE SODIUM 100 MG PO CAPS Oral Take 100 mg by mouth daily as needed. For stool softner    . HYDROCHLOROTHIAZIDE 25 MG PO TABS Oral Take 25 mg by mouth daily as needed. For fluid  retension    . IBUPROFEN 200 MG PO TABS Oral Take 200 mg by mouth every 6 (six) hours as needed.    . ADULT MULTIVITAMIN W/MINERALS CH Oral Take 1 tablet by mouth daily.    Marland Kitchen OMEPRAZOLE 40 MG PO CPDR Oral Take 1 capsule (40 mg total) by mouth daily. 90 capsule 2    BP 120/68  Pulse 124  Temp(Src) 98.1 F (36.7 C) (Oral)  Resp 20  SpO2 97%  LMP 08/21/2011  Vital signs normal    Physical Exam  Constitutional: She is oriented to person, place, and time. She appears well-developed and well-nourished.  Non-toxic appearance. She does not appear ill. No distress.  HENT:  Head: Normocephalic and atraumatic.  Right Ear: External ear normal.  Left Ear: External ear normal.  Nose: Nose normal. No mucosal edema or rhinorrhea.  Mouth/Throat: Oropharynx is clear and moist and mucous membranes are normal. No dental abscesses or uvula swelling.  Eyes: Conjunctivae and EOM are normal.  Pupils are equal, round, and reactive to light.  Neck: Normal range of motion and full passive range of motion without pain. Neck supple.  Cardiovascular: Normal rate, regular rhythm and normal heart sounds.  Exam reveals no gallop and no friction rub.   No murmur heard. Pulmonary/Chest: Effort normal and breath sounds normal. No respiratory distress. She has no wheezes. She has no rhonchi. She has no rales. She exhibits no tenderness and no crepitus.  Abdominal: Soft. Normal appearance and bowel sounds are normal. She exhibits no distension. There is no tenderness. There is no rebound and no guarding.  Musculoskeletal: Normal range of motion. She exhibits no edema and no tenderness.       Moves all extremities well.   Neurological: She is alert and oriented to person, place, and time. She has normal strength. No cranial nerve deficit.  Skin: Skin is warm, dry and intact. No rash noted. No erythema. No pallor.  Psychiatric: She has a normal mood and affect. Her speech is normal and behavior is normal. Her mood appears not anxious.    ED Course  Procedures (including critical care time)  Patient remained with a baseline tachycardia of around 115. She was given 1 L of IV fluids and her heart rate improved to 100. She relates she feels better. She was angulated by nursing staff and her pulse ox remained 98% and she did not get worsening shortness of breath. At this point it was felt she could go home. I felt patient had diuresed herself too much when she had the swelling in her ankles earlier this week. Patient is agreeable to drinking more fluids at home.  Results for orders placed during the hospital encounter of 08/29/11  CBC      Component Value Range   WBC 11.8 (*) 4.0 - 10.5 (K/uL)   RBC 5.40 (*) 3.87 - 5.11 (MIL/uL)   Hemoglobin 14.6  12.0 - 15.0 (g/dL)   HCT 16.1  09.6 - 04.5 (%)   MCV 80.7  78.0 - 100.0 (fL)   MCH 27.0  26.0 - 34.0 (pg)   MCHC 33.5  30.0 - 36.0 (g/dL)   RDW 40.9   81.1 - 91.4 (%)   Platelets 402 (*) 150 - 400 (K/uL)  BASIC METABOLIC PANEL      Component Value Range   Sodium 138  135 - 145 (mEq/L)   Potassium 3.9  3.5 - 5.1 (mEq/L)   Chloride 98  96 - 112 (mEq/L)   CO2 26  19 - 32 (mEq/L)   Glucose, Bld 101 (*) 70 - 99 (mg/dL)   BUN 10  6 - 23 (mg/dL)   Creatinine, Ser 6.21  0.50 - 1.10 (mg/dL)   Calcium 30.8  8.4 - 10.5 (mg/dL)   GFR calc non Af Amer 79 (*) >90 (mL/min)   GFR calc Af Amer >90  >90 (mL/min)  PRO B NATRIURETIC PEPTIDE      Component Value Range   Pro B Natriuretic peptide (BNP) 14.5  0 - 125 (pg/mL)  TROPONIN I      Component Value Range   Troponin I <0.30  <0.30 (ng/mL)  D-DIMER, QUANTITATIVE      Component Value Range   D-Dimer, Quant 0.30  0.00 - 0.48 (ug/mL-FEU)  APTT      Component Value Range   aPTT 32  24 - 37 (seconds)  PROTIME-INR      Component Value Range   Prothrombin Time 14.0  11.6 - 15.2 (seconds)   INR 1.06  0.00 - 1.49    Laboratory interpretation all normal except leukocytosis   Dg Chest 2 View  08/27/2011  [facility] *RADIOLOGY REPORT*  Clinical Data: Neck pain, fell 1 week ago  CHEST - 2 VIEW  Comparison: None  Findings: Normal heart size, mediastinal contours, and pulmonary vascularity. Lungs clear. Bones unremarkable. No pneumothorax.  IMPRESSION: Normal exam.  Original Report Authenticated By: Lollie Marrow, M.D.   Dg Cervical Spine Complete  08/27/2011  [facility] *RADIOLOGY REPORT*  Clinical Data: Chest and neck pain, fell 1 week ago  CERVICAL SPINE - COMPLETE 4+ VIEW  Comparison: None  Findings: C7 and T1 seen only for alignment purposes due to superimposition of the shoulders. Minimal end plate spur formation inferior C5. Vertebral body and disc space heights maintained. Osseous mineralization normal. Prevertebral soft tissues normal thickness. Inferior left cervical neural foramina incompletely profiled. No acute fracture, dislocation or bone  destruction. Visualized lung apices clear.  IMPRESSION: No radiographic evidence of acute injury.  Original Report Authenticated By: Lollie Marrow, M.D.    Date: 08/29/2011  Rate: 131  Rhythm: sinus tachycardia  QRS Axis: left  Intervals: normal  ST/T Wave abnormalities: normal  Conduction Disutrbances:none  Narrative Interpretation:   Old EKG Reviewed: changes noted from 04/17/2010 rate was 91     1. Shortness of breath   2. Dehydration   3. Chest pain    Plan discharge  Devoria Albe, MD, FACEP    MDM          Ward Givens, MD 08/29/11 2136

## 2011-08-29 NOTE — ED Notes (Signed)
To ed for further eval of sob and cp. States she was seen at [facility] for eval after tripping and falling. Pt states she takes fluid pills as needed for swelling. Describes pain in chest as tightness. States she has a nonproductive cough which is turning productive today. No fever. Unable to walk across room without being very sob

## 2011-08-29 NOTE — ED Notes (Signed)
Called patient in waiting room to re-assess vitals; no answer

## 2011-08-29 NOTE — ED Notes (Signed)
Patient states she was seen at Urgent Care for chest pain and was sent home with diagnosis of chest wall muscular pain. Patient states that this morning the pain became worse and the pain feels sharpe and tight radiating from the left upper chest to the left side shoulder blade pain.  Patient placed on monitor and EKG captured on arrival to the ED.  Patient placed on monitor and oxygen sats of 98% RA. Patient states she feels tightness in her chest at this time and SOB.

## 2011-08-29 NOTE — Discharge Instructions (Signed)
Your tests tonight did not show any evidence of a heart attack or excess fluid in her lungs disease congestive heart failure). I suspect he had lost too much fluid from your fluid pills and that you're dehydrated which made you feel bad. You can let Dr. Eden Emms know about  tonight and see if he wants to do any other tests on you.

## 2011-08-29 NOTE — ED Notes (Signed)
Ambulated Pt Pts O2 stayed at 98% the whole time. Did great ambulating

## 2011-09-03 ENCOUNTER — Telehealth: Payer: Self-pay

## 2011-09-03 NOTE — Telephone Encounter (Signed)
PT STATES THAT SHE HAD A FORM FAXED HERE ON MARCH 8TH, FOR A SIGNATURE STATING THAT SHE HAD A PE DONE HERE AND ALSO ON MARCH 12TH, PT BROUGHT IN DISABILITY FORMS TO BE FILLED OUT. PT HAS NOT YET HEARD ANYTHING REGARDING ANY ONE OF THOSE ITEMS, PT ALSO STATES THAT HER CREDIT CARD INFO WAS TAKEN BECAUSE WE DID NOT KNOW HOW TO POST THE PAYMENTS FOR DISABILITY CLAIMS AT THAT TIME, THE PT IS CONCERNED ABOUT THIS AS WELL BECAUSE THE PAYMENT HAS NOT BEEN TAKEN OUT OF HER ACCOUNT AS OF YET AND SHE'S NOT COMFORTABLE WITH HER DEBIT CARD INFO "FLOATING" AROUND OUR OFFICE. PT'S CHART IS CURRENTLY IN DR MCPHERSON'S BOX WITH AN ATTACHED FORM PLEASE ADVISE).

## 2011-09-03 NOTE — Telephone Encounter (Signed)
Adrianne, should this go to you or Maudia?

## 2012-01-16 ENCOUNTER — Other Ambulatory Visit: Payer: Self-pay

## 2012-01-16 MED ORDER — ALPRAZOLAM 0.25 MG PO TABS: 0.2500 mg | ORAL_TABLET | Freq: Every evening | ORAL | Status: DC | PRN

## 2012-04-29 ENCOUNTER — Other Ambulatory Visit: Payer: Self-pay | Admitting: Physician Assistant

## 2012-04-29 ENCOUNTER — Other Ambulatory Visit: Payer: Self-pay | Admitting: Family Medicine

## 2012-04-29 NOTE — Telephone Encounter (Signed)
Please pull paper chart for medication review.

## 2012-04-30 NOTE — Telephone Encounter (Signed)
I have not seen this pt this year (if at all); I will not authorize this medication as I am not sure who the original prescriber is.

## 2012-04-30 NOTE — Telephone Encounter (Signed)
Chart pulled to PA pool at nurses station [phone]

## 2012-06-26 ENCOUNTER — Other Ambulatory Visit: Payer: Self-pay | Admitting: Family Medicine

## 2012-06-28 ENCOUNTER — Other Ambulatory Visit: Payer: Self-pay | Admitting: Family Medicine

## 2012-08-03 ENCOUNTER — Other Ambulatory Visit: Payer: Self-pay | Admitting: Physician Assistant

## 2012-08-06 ENCOUNTER — Telehealth: Payer: Self-pay

## 2012-08-06 MED ORDER — HYDROCHLOROTHIAZIDE 25 MG PO TABS: 25.0000 mg | ORAL_TABLET | Freq: Every day | ORAL | Status: DC

## 2012-08-06 NOTE — Telephone Encounter (Signed)
PT STATES SHE WAS DENIED HER HYDROCHLOR, BUT SHE HAVE AN APPT WITH DR [facility] ON 08/22/12, SO SHE WOULD LIKE TO HAVE HER MEDS REFILLED SINCE SHE IS OUT  UNTIL THEN. PLEASE CALL [phone]    [facility] [place]

## 2012-08-06 NOTE — Telephone Encounter (Signed)
sent in for her called her to advise.

## 2012-08-22 ENCOUNTER — Ambulatory Visit (INDEPENDENT_AMBULATORY_CARE_PROVIDER_SITE_OTHER): Payer: No Typology Code available for payment source | Admitting: Family Medicine

## 2012-08-22 ENCOUNTER — Encounter: Payer: Self-pay | Admitting: Family Medicine

## 2012-08-22 VITALS — BP 131/84 | HR 80 | Temp 97.4°F | Resp 16 | Ht 67.0 in | Wt 215.0 lb

## 2012-08-22 DIAGNOSIS — M545 Low back pain, unspecified: Secondary | ICD-10-CM

## 2012-08-22 DIAGNOSIS — Z13220 Encounter for screening for lipoid disorders: Secondary | ICD-10-CM

## 2012-08-22 DIAGNOSIS — N390 Urinary tract infection, site not specified: Secondary | ICD-10-CM

## 2012-08-22 DIAGNOSIS — Z130 Encounter for screening for diseases of the blood and blood-forming organs and certain disorders involving the immune mechanism: Secondary | ICD-10-CM

## 2012-08-22 DIAGNOSIS — N812 Incomplete uterovaginal prolapse: Secondary | ICD-10-CM

## 2012-08-22 DIAGNOSIS — Z0000 Encounter for general adult medical examination without abnormal findings: Secondary | ICD-10-CM

## 2012-08-22 DIAGNOSIS — Z809 Family history of malignant neoplasm, unspecified: Secondary | ICD-10-CM

## 2012-08-22 LAB — CBC WITH DIFFERENTIAL/PLATELET
Basophils Absolute: 0 10*3/uL (ref 0.0–0.1)
Eosinophils Absolute: 0.1 10*3/uL (ref 0.0–0.7)
Eosinophils Relative: 1 % (ref 0–5)
MCH: 24.2 pg — ABNORMAL LOW (ref 26.0–34.0)
MCHC: 33.2 g/dL (ref 30.0–36.0)
MCV: 72.8 fL — ABNORMAL LOW (ref 78.0–100.0)
Platelets: 380 10*3/uL (ref 150–400)
RDW: 14.8 % (ref 11.5–15.5)

## 2012-08-22 LAB — COMPREHENSIVE METABOLIC PANEL
Alkaline Phosphatase: 97 U/L (ref 39–117)
Glucose, Bld: 95 mg/dL (ref 70–99)
Sodium: 135 mEq/L (ref 135–145)
Total Bilirubin: 0.4 mg/dL (ref 0.3–1.2)
Total Protein: 7.3 g/dL (ref 6.0–8.3)

## 2012-08-22 LAB — POCT URINALYSIS DIPSTICK
Blood, UA: NEGATIVE
Glucose, UA: NEGATIVE
Nitrite, UA: NEGATIVE
Urobilinogen, UA: 0.2

## 2012-08-22 LAB — LIPID PANEL
Cholesterol: 207 mg/dL — ABNORMAL HIGH (ref 0–200)
HDL: 36 mg/dL — ABNORMAL LOW (ref >39)
Total CHOL/HDL Ratio: 5.8 Ratio
Triglycerides: 237 mg/dL — ABNORMAL HIGH (ref <150)
VLDL: 47 mg/dL — ABNORMAL HIGH (ref 0–40)

## 2012-08-22 MED ORDER — ALPRAZOLAM 0.25 MG PO TABS: 0.2500 mg | ORAL_TABLET | Freq: Every evening | ORAL | Status: DC | PRN

## 2012-08-22 MED ORDER — HYDROCHLOROTHIAZIDE 25 MG PO TABS: 25.0000 mg | ORAL_TABLET | Freq: Every day | ORAL | Status: DC

## 2012-08-22 MED ORDER — OMEPRAZOLE 40 MG PO CPDR: 40.0000 mg | DELAYED_RELEASE_CAPSULE | Freq: Every day | ORAL | Status: DC

## 2012-08-22 NOTE — Patient Instructions (Addendum)
Keeping You Healthy  Get These Tests 1. Blood Pressure- Have your blood pressure checked once a year by your health care provider.  Normal blood pressure is 120/80. 2. Weight- Have your body mass index (BMI) calculated to screen for obesity.  BMI is measure of body fat based on height and weight.  You can also calculate your own BMI at https://www.west-esparza.com/. 3. Cholesterol- Have your cholesterol checked every 5 years starting at age 49 then yearly starting at age 61. 4. Chlamydia, HIV, and other sexually transmitted diseases- Get screened every year until age 21, then within three months of each new sexual provider. 5. Pap Smear- Every 1-3 years; discuss with your health care provider. 6. Mammogram- Every year starting at age 68  Take these medicines  Calcium with Vitamin D-Your body needs 1200 mg of Calcium each day and [phone] IU of Vitamin D daily.  Your body can only absorb 500 mg of Calcium at a time so Calcium must be taken in 2 or 3 divided doses throughout the day.  Multivitamin with folic acid- Once daily if it is possible for you to become pregnant.  Get these Immunizations  Gardasil-Series of three doses; prevents HPV related illness such as genital warts and cervical cancer.  Menactra-Single dose; prevents meningitis.  Tetanus shot- Every 10 years. You  Last received this vaccine 04/18/2011.  Next due in 2022.  Flu shot-Every year.  Take these steps 1. Do not smoke-Your healthcare provider can help you quit.  For tips on how to quit go to www.smokefree.gov or call 1-800 QUITNOW. 2. Be physically active- Exercise 5 days a week for at least 30 minutes.  If you are not already physically active, start slow and gradually work up to 30 minutes of moderate physical activity.  Examples of moderate activity include walking briskly, dancing, swimming, bicycling, etc. 3. Breast Cancer- A self breast exam every month is important for early detection of breast cancer.  For more  information and instruction on self breast exams, ask your healthcare provider or [place]. 4. Eat a healthy diet- Eat a variety of healthy foods such as fruits, vegetables, whole grains, low fat milk, low fat cheeses, yogurt, lean meats, poultry and fish, beans, nuts, tofu, etc.  For more information go to www. Thenutritionsource.org 5. Drink alcohol in moderation- Limit alcohol intake to one drink or less per day. Never drink and drive. 6. Depression- Your emotional health is as important as your physical health.  If you're feeling down or losing interest in things you normally enjoy please talk to your healthcare provider about being screened for depression. 7. Dental visit- Brush and floss your teeth twice daily; visit your dentist twice a year. 8. Eye doctor- Get an eye exam at least every 2 years. 9. Helmet use- Always wear a helmet when riding a bicycle, motorcycle, rollerblading or skateboarding. 10. Safe sex- If you may be exposed to sexually transmitted infections, use a condom. 11. Seat belts- Seat belts can save your live; always wear one. 12. Smoke/Carbon Monoxide detectors- These detectors need to be installed on the appropriate level of your home. Replace batteries at least once a year. 13. Skin cancer- When out in the sun please cover up and use sunscreen 15 SPF or higher. 14. Violence- If anyone is threatening or hurting you, please tell your healthcare provider.    For back pain, check with your insurer to see if Chiropractic services are covered then you can self-refer for treatment for back and hip pain.
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
## 2012-08-25 ENCOUNTER — Encounter: Payer: Self-pay | Admitting: Family Medicine

## 2012-08-26 ENCOUNTER — Encounter: Payer: Self-pay | Admitting: Family Medicine

## 2012-08-28 ENCOUNTER — Encounter: Payer: Self-pay | Admitting: Family Medicine

## 2012-08-30 DIAGNOSIS — Z809 Family history of malignant neoplasm, unspecified: Secondary | ICD-10-CM | POA: Insufficient documentation

## 2012-09-01 ENCOUNTER — Telehealth: Payer: Self-pay

## 2012-09-01 NOTE — Telephone Encounter (Signed)
Patient called about a form that she needs to have Dr. Audria Nine to sign and fax to her.  She states that Dr. Audria Nine had faxed the form previously, but it was not signed.  I also see communication in the chart through mychart regarding this same matter.

## 2012-09-03 ENCOUNTER — Telehealth: Payer: Self-pay | Admitting: Family Medicine

## 2012-09-03 NOTE — Telephone Encounter (Signed)
I do not have the form here at 104; can you have a staff person check to see if the form is at 102 and bring it to 104 for my signature. We can fax it to pt from here. Thanks.

## 2012-09-03 NOTE — Telephone Encounter (Signed)
Faxed signed annual physical statement to pt.

## 2012-09-09 ENCOUNTER — Encounter (HOSPITAL_COMMUNITY): Payer: Self-pay

## 2012-09-10 ENCOUNTER — Encounter (HOSPITAL_COMMUNITY): Payer: Self-pay

## 2012-09-10 ENCOUNTER — Encounter (HOSPITAL_COMMUNITY)
Admission: RE | Admit: 2012-09-10 | Discharge: 2012-09-10 | Disposition: A | Discharge status: Home / Self Care | Payer: No Typology Code available for payment source | Source: Ambulatory Visit | Attending: Obstetrics & Gynecology | Admitting: Obstetrics & Gynecology

## 2012-09-10 HISTORY — DX: Personal history of other diseases of the digestive system: Z87.19

## 2012-09-10 HISTORY — DX: Other intervertebral disc degeneration, lumbar region without mention of lumbar back pain or lower extremity pain: M51.369

## 2012-09-10 HISTORY — DX: Family history of other specified conditions: Z84.89

## 2012-09-10 HISTORY — DX: Gastro-esophageal reflux disease without esophagitis: K21.9

## 2012-09-10 HISTORY — DX: Essential (primary) hypertension: I10

## 2012-09-10 HISTORY — DX: Other intervertebral disc degeneration, lumbar region: M51.36

## 2012-09-10 LAB — CBC
HCT: 36.4 % (ref 36.0–46.0)
MCHC: 30.8 g/dL (ref 30.0–36.0)
MCV: 79.3 fL (ref 78.0–100.0)
RDW: 14.1 % (ref 11.5–15.5)

## 2012-09-10 LAB — BASIC METABOLIC PANEL
BUN: 12 mg/dL (ref 6–23)
Creatinine, Ser: 0.85 mg/dL (ref 0.50–1.10)
GFR calc Af Amer: >90 mL/min (ref >90)
GFR calc non Af Amer: 80 mL/min — ABNORMAL LOW (ref >90)
Glucose, Bld: 103 mg/dL — ABNORMAL HIGH (ref 70–99)

## 2012-09-10 NOTE — Pre-Procedure Instructions (Signed)
Past EKG and today's (09/10/12) reviewed by Brayton Caves, MD. No orders given.

## 2012-09-10 NOTE — Patient Instructions (Addendum)
[name]  09/10/2012   Your procedure is scheduled on:  09/18/12  Enter through the Main Entrance of [facility] at 6 AM.  Pick up the phone at the desk and dial [phone].   Call this number if you have problems the morning of surgery: [phone]   Remember:   Do not eat food:After Midnight.  Do not drink clear liquids: After Midnight.  Take these medicines the morning of surgery with A SIP OF WATER: Potassium, Prilosec   Do not wear jewelry, make-up or nail polish.  Do not wear lotions, powders, or perfumes. You may wear deodorant.  Do not shave 48 hours prior to surgery.  Do not bring valuables to the hospital.  Contacts, dentures or bridgework may not be worn into surgery.  Leave suitcase in the car. After surgery it may be brought to your room.  For patients admitted to the hospital, checkout time is 11:00 AM the day of discharge.   Patients discharged the day of surgery will not be allowed to drive home.  Name and phone number of your driver: NA  Special Instructions: Shower using CHG 2 nights before surgery and the night before surgery.  If you shower the day of surgery use CHG.  Use special wash - you have one bottle of CHG for all showers.  You should use approximately 1/3 of the bottle for each shower.   Please read over the following fact sheets that you were given: MRSA Information

## 2012-09-17 NOTE — H&P (Signed)
Angelica Sharp is an 47 y.o. female with symptomatic pelvic organ prolapse.  Declines conservative management.  H/O Hepatitis C, CHTN.    Pertinent Gynecological History: Menses: irregular occurring approximately every 28-34 days without intermenstrual spotting Bleeding: dysfunctional uterine bleeding Contraception: tubal ligation DES exposure: unknown Blood transfusions: none Sexually transmitted diseases: no past history Previous GYN Procedures: BTL  Last mammogram: normal Date: 2012 Last pap: normal Date: 07/2011 OB History: G3, P3  Menstrual History: Menarche age: n/a Patient's last menstrual period was 08/10/2012.    Past Medical History  Diagnosis Date  . Chest pain   . SOB (shortness of breath)   . Edema     lower extremity  . Borderline hypertension   . Arthritis   . Family history of anesthesia complication     sister had BP complications during anes.  . Hypertension   . Degenerative lumbar disc     L5S1  . GERD (gastroesophageal reflux disease)   . H/O hiatal hernia     Past Surgical History  Procedure Laterality Date  . Tubal ligation    . Breast lumpectomy      3 lumps -- two from right -- one from left    Family History  Problem Relation Age of Onset  . Heart attack Mother   . Diabetes Mother   . Heart attack Sister   . Pulmonary embolism Father   . Diabetes Sister   . Kidney disease Sister     kidney failure    Social History:  reports that she has never smoked. She does not have any smokeless tobacco history on file. She reports that she does not drink alcohol or use illicit drugs.  Allergies:  Allergies  Allergen Reactions  . Morphine And Related     Headache and hallucinations  . Septra (Sulfamethoxazole W-Trimethoprim)     Increased heartrate    No prescriptions prior to admission    ROS  Last menstrual period 08/10/2012. Physical Exam  Constitutional: She is oriented to person, place, and time. She appears well-developed and  well-nourished.  Cardiovascular: Normal rate and regular rhythm.   Respiratory: Effort normal and breath sounds normal.  GI: Soft. There is no rebound and no guarding.  Neurological: She is alert and oriented to person, place, and time.  Skin: Skin is warm and dry.  Psychiatric: She has a normal mood and affect. Her behavior is normal.    No results found for this or any previous visit (from the past 24 hour(s)).  No results found.  Assessment/Plan: 46yo with symptomatic pelvic organ prolapse LAVH, with A&P repair, SSLF  Zahriyah Joo 09/17/2012, 7:51 PM

## 2012-09-18 ENCOUNTER — Encounter (HOSPITAL_COMMUNITY): Payer: Self-pay | Admitting: Anesthesiology

## 2012-09-18 ENCOUNTER — Encounter (HOSPITAL_COMMUNITY)
Admission: RE | Disposition: A | Discharge status: Home / Self Care | Payer: Self-pay | Source: Ambulatory Visit | Attending: Obstetrics & Gynecology

## 2012-09-18 ENCOUNTER — Ambulatory Visit (HOSPITAL_COMMUNITY): Payer: No Typology Code available for payment source | Admitting: Anesthesiology

## 2012-09-18 ENCOUNTER — Encounter (HOSPITAL_COMMUNITY): Payer: Self-pay | Admitting: Registered Nurse

## 2012-09-18 ENCOUNTER — Observation Stay (HOSPITAL_COMMUNITY)
Admission: RE | Admit: 2012-09-18 | Discharge: 2012-09-19 | Disposition: A | Discharge status: Home / Self Care | Payer: No Typology Code available for payment source | Source: Ambulatory Visit | Attending: Obstetrics & Gynecology | Admitting: Obstetrics & Gynecology

## 2012-09-18 DIAGNOSIS — N938 Other specified abnormal uterine and vaginal bleeding: Secondary | ICD-10-CM | POA: Insufficient documentation

## 2012-09-18 DIAGNOSIS — N949 Unspecified condition associated with female genital organs and menstrual cycle: Secondary | ICD-10-CM | POA: Insufficient documentation

## 2012-09-18 DIAGNOSIS — B1920 Unspecified viral hepatitis C without hepatic coma: Secondary | ICD-10-CM | POA: Insufficient documentation

## 2012-09-18 DIAGNOSIS — N72 Inflammatory disease of cervix uteri: Secondary | ICD-10-CM | POA: Insufficient documentation

## 2012-09-18 DIAGNOSIS — N8000 Endometriosis of the uterus, unspecified: Secondary | ICD-10-CM | POA: Insufficient documentation

## 2012-09-18 DIAGNOSIS — N814 Uterovaginal prolapse, unspecified: Principal | ICD-10-CM | POA: Insufficient documentation

## 2012-09-18 DIAGNOSIS — I10 Essential (primary) hypertension: Secondary | ICD-10-CM | POA: Insufficient documentation

## 2012-09-18 DIAGNOSIS — K219 Gastro-esophageal reflux disease without esophagitis: Secondary | ICD-10-CM | POA: Insufficient documentation

## 2012-09-18 HISTORY — PX: LAPAROSCOPIC ASSISTED VAGINAL HYSTERECTOMY: SHX5398

## 2012-09-18 SURGERY — HYSTERECTOMY, VAGINAL, LAPAROSCOPY-ASSISTED
Anesthesia: General | Site: Abdomen | Wound class: Clean Contaminated

## 2012-09-18 MED ORDER — BUPIVACAINE HCL (PF) 0.25 % IJ SOLN
INTRAMUSCULAR | Status: AC
  Filled 2012-09-18: qty 30

## 2012-09-18 MED ORDER — LACTATED RINGERS IV SOLN
INTRAVENOUS | Status: DC
  Administered 2012-09-18 (×2): via INTRAVENOUS
  Administered 2012-09-18: 1000 mL via INTRAVENOUS

## 2012-09-18 MED ORDER — MIDAZOLAM HCL 5 MG/5ML IJ SOLN
INTRAMUSCULAR | Status: DC | PRN
  Administered 2012-09-18: 2 mg via INTRAVENOUS

## 2012-09-18 MED ORDER — FENTANYL CITRATE 0.05 MG/ML IJ SOLN
INTRAMUSCULAR | Status: DC | PRN
Start: 2012-09-18 — End: 2012-09-18
  Administered 2012-09-18: 100 ug via INTRAVENOUS
  Administered 2012-09-18 (×2): 50 ug via INTRAVENOUS
  Administered 2012-09-18: 150 ug via INTRAVENOUS
  Administered 2012-09-18: 50 ug via INTRAVENOUS

## 2012-09-18 MED ORDER — LACTATED RINGERS IR SOLN
Status: DC | PRN
  Administered 2012-09-18: 3000 mL

## 2012-09-18 MED ORDER — BUPIVACAINE HCL (PF) 0.25 % IJ SOLN
INTRAMUSCULAR | Status: DC | PRN
  Administered 2012-09-18: 3 mL

## 2012-09-18 MED ORDER — LIDOCAINE-EPINEPHRINE (PF) 1 %-1:200000 IJ SOLN
INTRAMUSCULAR | Status: AC
  Filled 2012-09-18: qty 10

## 2012-09-18 MED ORDER — FENTANYL CITRATE 0.05 MG/ML IJ SOLN
INTRAMUSCULAR | Status: AC
  Filled 2012-09-18: qty 5

## 2012-09-18 MED ORDER — HYDROMORPHONE HCL PF 1 MG/ML IJ SOLN
0.2500 mg | INTRAMUSCULAR | Status: DC | PRN
  Administered 2012-09-18: 0.5 mg via INTRAVENOUS

## 2012-09-18 MED ORDER — ROCURONIUM BROMIDE 100 MG/10ML IV SOLN
INTRAVENOUS | Status: DC | PRN
  Administered 2012-09-18: 60 mg via INTRAVENOUS

## 2012-09-18 MED ORDER — HYDROMORPHONE HCL PF 1 MG/ML IJ SOLN
INTRAMUSCULAR | Status: DC | PRN
  Administered 2012-09-18 (×2): 0.5 mg via INTRAVENOUS

## 2012-09-18 MED ORDER — HYDROMORPHONE HCL PF 1 MG/ML IJ SOLN: 0.2000 mg | INTRAMUSCULAR | Status: DC | PRN

## 2012-09-18 MED ORDER — ALPRAZOLAM 0.25 MG PO TABS: 0.2500 mg | ORAL_TABLET | Freq: Every evening | ORAL | Status: DC | PRN

## 2012-09-18 MED ORDER — SIMETHICONE 80 MG PO CHEW: 80.0000 mg | CHEWABLE_TABLET | Freq: Four times a day (QID) | ORAL | Status: DC | PRN

## 2012-09-18 MED ORDER — LIDOCAINE-EPINEPHRINE (PF) 1 %-1:200000 IJ SOLN
INTRAMUSCULAR | Status: DC | PRN
  Administered 2012-09-18: 30 mL

## 2012-09-18 MED ORDER — MENTHOL 3 MG MT LOZG: 1.0000 | LOZENGE | OROMUCOSAL | Status: DC | PRN

## 2012-09-18 MED ORDER — LIDOCAINE HCL (CARDIAC) 20 MG/ML IV SOLN
INTRAVENOUS | Status: DC | PRN
  Administered 2012-09-18: 100 mg via INTRAVENOUS

## 2012-09-18 MED ORDER — CEFAZOLIN SODIUM-DEXTROSE 2-3 GM-% IV SOLR
2.0000 g | INTRAVENOUS | Status: AC
  Administered 2012-09-18: 2 g via INTRAVENOUS

## 2012-09-18 MED ORDER — PROPOFOL 10 MG/ML IV EMUL
INTRAVENOUS | Status: AC
  Filled 2012-09-18: qty 20

## 2012-09-18 MED ORDER — KETOROLAC TROMETHAMINE 30 MG/ML IJ SOLN
INTRAMUSCULAR | Status: DC | PRN
  Administered 2012-09-18: 30 mg via INTRAVENOUS

## 2012-09-18 MED ORDER — GLYCOPYRROLATE 0.2 MG/ML IJ SOLN
INTRAMUSCULAR | Status: DC | PRN
  Administered 2012-09-18: 0.2 mg via INTRAVENOUS

## 2012-09-18 MED ORDER — DEXTROSE IN LACTATED RINGERS 5 % IV SOLN
INTRAVENOUS | Status: DC
  Administered 2012-09-18 – 2012-09-19 (×3): via INTRAVENOUS

## 2012-09-18 MED ORDER — NEOSTIGMINE METHYLSULFATE 1 MG/ML IJ SOLN
INTRAMUSCULAR | Status: AC
  Filled 2012-09-18: qty 1

## 2012-09-18 MED ORDER — KETOROLAC TROMETHAMINE 30 MG/ML IJ SOLN
INTRAMUSCULAR | Status: AC
  Filled 2012-09-18: qty 1

## 2012-09-18 MED ORDER — GLYCOPYRROLATE 0.2 MG/ML IJ SOLN
INTRAMUSCULAR | Status: AC
  Filled 2012-09-18: qty 1

## 2012-09-18 MED ORDER — PANTOPRAZOLE SODIUM 40 MG PO TBEC
80.0000 mg | DELAYED_RELEASE_TABLET | Freq: Every day | ORAL | Status: DC
  Administered 2012-09-19: 80 mg via ORAL
  Filled 2012-09-18 (×3): qty 2

## 2012-09-18 MED ORDER — HYDROMORPHONE HCL PF 1 MG/ML IJ SOLN
INTRAMUSCULAR | Status: AC
  Filled 2012-09-18: qty 1

## 2012-09-18 MED ORDER — ROCURONIUM BROMIDE 50 MG/5ML IV SOLN
INTRAVENOUS | Status: AC
  Filled 2012-09-18: qty 1

## 2012-09-18 MED ORDER — DEXTROSE IN LACTATED RINGERS 5 % IV SOLN: INTRAVENOUS | Status: DC

## 2012-09-18 MED ORDER — PANTOPRAZOLE SODIUM 40 MG PO TBEC
40.0000 mg | DELAYED_RELEASE_TABLET | Freq: Once | ORAL | Status: AC | PRN
  Filled 2012-09-18: qty 1

## 2012-09-18 MED ORDER — ONDANSETRON HCL 4 MG/2ML IJ SOLN
4.0000 mg | Freq: Four times a day (QID) | INTRAMUSCULAR | Status: DC | PRN
  Administered 2012-09-18: 4 mg via INTRAVENOUS
  Filled 2012-09-18: qty 2

## 2012-09-18 MED ORDER — OXYCODONE-ACETAMINOPHEN 5-325 MG PO TABS
1.0000 | ORAL_TABLET | ORAL | Status: DC | PRN
  Administered 2012-09-18 – 2012-09-19 (×4): 1 via ORAL
  Filled 2012-09-18 (×4): qty 1

## 2012-09-18 MED ORDER — FENTANYL CITRATE 0.05 MG/ML IJ SOLN: 25.0000 ug | INTRAMUSCULAR | Status: DC | PRN

## 2012-09-18 MED ORDER — NEOSTIGMINE METHYLSULFATE 1 MG/ML IJ SOLN
INTRAMUSCULAR | Status: DC | PRN
  Administered 2012-09-18: 1 mg via INTRAVENOUS

## 2012-09-18 MED ORDER — KETOROLAC TROMETHAMINE 30 MG/ML IJ SOLN: 15.0000 mg | Freq: Once | INTRAMUSCULAR | Status: DC | PRN

## 2012-09-18 MED ORDER — PANTOPRAZOLE SODIUM 40 MG PO TBEC: 40.0000 mg | DELAYED_RELEASE_TABLET | Freq: Every day | ORAL | Status: DC

## 2012-09-18 MED ORDER — PROPOFOL 10 MG/ML IV BOLUS
INTRAVENOUS | Status: DC | PRN
  Administered 2012-09-18: 200 mg via INTRAVENOUS

## 2012-09-18 MED ORDER — ESTRADIOL 0.1 MG/GM VA CREA
TOPICAL_CREAM | VAGINAL | Status: AC
  Filled 2012-09-18: qty 42.5

## 2012-09-18 MED ORDER — KETOROLAC TROMETHAMINE 30 MG/ML IJ SOLN
30.0000 mg | Freq: Four times a day (QID) | INTRAMUSCULAR | Status: DC
  Administered 2012-09-18 – 2012-09-19 (×4): 30 mg via INTRAVENOUS
  Filled 2012-09-18 (×4): qty 1

## 2012-09-18 MED ORDER — MIDAZOLAM HCL 2 MG/2ML IJ SOLN
INTRAMUSCULAR | Status: AC
  Filled 2012-09-18: qty 2

## 2012-09-18 MED ORDER — ACETAMINOPHEN 160 MG/5ML PO SOLN
ORAL | Status: AC
  Administered 2012-09-18: 975 mg via ORAL
  Filled 2012-09-18: qty 20.3

## 2012-09-18 MED ORDER — HYDROMORPHONE HCL PF 1 MG/ML IJ SOLN
INTRAMUSCULAR | Status: AC
  Administered 2012-09-18: 0.5 mg via INTRAVENOUS
  Filled 2012-09-18: qty 1

## 2012-09-18 MED ORDER — ZOLPIDEM TARTRATE 5 MG PO TABS: 5.0000 mg | ORAL_TABLET | Freq: Every evening | ORAL | Status: DC | PRN

## 2012-09-18 MED ORDER — DEXAMETHASONE SODIUM PHOSPHATE 10 MG/ML IJ SOLN
INTRAMUSCULAR | Status: AC
  Filled 2012-09-18: qty 1

## 2012-09-18 MED ORDER — PANTOPRAZOLE SODIUM 40 MG PO TBEC
DELAYED_RELEASE_TABLET | ORAL | Status: AC
  Administered 2012-09-18: 40 mg via ORAL
  Filled 2012-09-18: qty 1

## 2012-09-18 MED ORDER — ONDANSETRON HCL 4 MG/2ML IJ SOLN
INTRAMUSCULAR | Status: DC | PRN
  Administered 2012-09-18: 4 mg via INTRAVENOUS

## 2012-09-18 MED ORDER — ONDANSETRON HCL 4 MG/2ML IJ SOLN
INTRAMUSCULAR | Status: AC
  Filled 2012-09-18: qty 2

## 2012-09-18 MED ORDER — ONDANSETRON HCL 4 MG PO TABS: 4.0000 mg | ORAL_TABLET | Freq: Four times a day (QID) | ORAL | Status: DC | PRN

## 2012-09-18 MED ORDER — KETOROLAC TROMETHAMINE 30 MG/ML IJ SOLN: 30.0000 mg | Freq: Four times a day (QID) | INTRAMUSCULAR | Status: DC

## 2012-09-18 MED ORDER — LIDOCAINE HCL (CARDIAC) 20 MG/ML IV SOLN
INTRAVENOUS | Status: AC
  Filled 2012-09-18: qty 5

## 2012-09-18 MED ORDER — CEFAZOLIN SODIUM-DEXTROSE 2-3 GM-% IV SOLR
INTRAVENOUS | Status: AC
  Filled 2012-09-18: qty 50

## 2012-09-18 MED ORDER — DEXAMETHASONE SODIUM PHOSPHATE 10 MG/ML IJ SOLN
INTRAMUSCULAR | Status: DC | PRN
  Administered 2012-09-18: 10 mg via INTRAVENOUS

## 2012-09-18 MED ORDER — DOCUSATE SODIUM 100 MG PO CAPS
100.0000 mg | ORAL_CAPSULE | Freq: Two times a day (BID) | ORAL | Status: DC
  Administered 2012-09-18 – 2012-09-19 (×2): 100 mg via ORAL
  Filled 2012-09-18 (×2): qty 1

## 2012-09-18 MED ORDER — ACETAMINOPHEN 160 MG/5ML PO SOLN: 975.0000 mg | Freq: Once | ORAL | Status: AC

## 2012-09-18 SURGICAL SUPPLY — 57 items
BANDAGE GAUZE ELAST BULKY 4 IN (GAUZE/BANDAGES/DRESSINGS) IMPLANT
BLADE SURG 11 STRL SS (BLADE) ×3 IMPLANT
BLADE SURG 15 STRL LF C SS BP (BLADE) IMPLANT
BLADE SURG 15 STRL SS (BLADE)
CABLE HIGH FREQUENCY MONO STRZ (ELECTRODE) IMPLANT
CANISTER SUCTION 2500CC (MISCELLANEOUS) ×3 IMPLANT
CATH ROBINSON RED A/P 16FR (CATHETERS) ×3 IMPLANT
CHLORAPREP W/TINT 26ML (MISCELLANEOUS) ×3 IMPLANT
CLOTH BEACON ORANGE TIMEOUT ST (SAFETY) ×3 IMPLANT
COVER TABLE BACK 60X90 (DRAPES) ×3 IMPLANT
DECANTER SPIKE VIAL GLASS SM (MISCELLANEOUS) ×3 IMPLANT
DERMABOND ADVANCED (GAUZE/BANDAGES/DRESSINGS) ×2
DERMABOND ADVANCED .7 DNX12 (GAUZE/BANDAGES/DRESSINGS) ×4 IMPLANT
DEVICE CAPIO SLIM SINGLE (INSTRUMENTS) IMPLANT
ELECT LIGASURE LONG (ELECTRODE) ×3 IMPLANT
ELECT REM PT RETURN 9FT ADLT (ELECTROSURGICAL) ×3
ELECTRODE REM PT RTRN 9FT ADLT (ELECTROSURGICAL) ×2 IMPLANT
FORCEPS CUTTING 45CM 5MM (CUTTING FORCEPS) ×3 IMPLANT
FORMULA NB 0-3 ENFAMIL (FORMULA) ×3 IMPLANT
GLOVE BIO SURGEON STRL SZ 6 (GLOVE) ×9 IMPLANT
GLOVE BIOGEL PI IND STRL 6 (GLOVE) ×6 IMPLANT
GLOVE BIOGEL PI INDICATOR 6 (GLOVE) ×3
GOWN STRL REIN XL XLG (GOWN DISPOSABLE) ×12 IMPLANT
NEEDLE HYPO 22GX1.5 SAFETY (NEEDLE) IMPLANT
NEEDLE INSUFFLATION 120MM (ENDOMECHANICALS) ×18 IMPLANT
NEEDLE MAYO .5 CIRCLE (NEEDLE) IMPLANT
NEEDLE MAYO 6 CRC TAPER PT (NEEDLE) ×3 IMPLANT
NS IRRIG 1000ML POUR BTL (IV SOLUTION) ×3 IMPLANT
PACK LAVH (CUSTOM PROCEDURE TRAY) ×3 IMPLANT
PACK VAGINAL WOMENS (CUSTOM PROCEDURE TRAY) ×3 IMPLANT
PROTECTOR NERVE ULNAR (MISCELLANEOUS) ×3 IMPLANT
SEALER TISSUE G2 CVD JAW 45CM (ENDOMECHANICALS) ×3 IMPLANT
SET CYSTO W/LG BORE CLAMP LF (SET/KITS/TRAYS/PACK) ×3 IMPLANT
SET IRRIG TUBING LAPAROSCOPIC (IRRIGATION / IRRIGATOR) ×3 IMPLANT
SUT CAPIO POLYGLYCOLIC (SUTURE) IMPLANT
SUT MNCRL 0 MO-4 VIOLET 18 CR (SUTURE) ×4 IMPLANT
SUT MNCRL AB 3-0 PS2 27 (SUTURE) ×3 IMPLANT
SUT MON AB 2-0 CT1 36 (SUTURE) ×3 IMPLANT
SUT MON AB 3-0 SH 27 (SUTURE)
SUT MON AB 3-0 SH27 (SUTURE) IMPLANT
SUT MONOCRYL 0 MO 4 18  CR/8 (SUTURE) ×2
SUT VIC AB 2-0 CT1 27 (SUTURE) ×1
SUT VIC AB 2-0 CT1 TAPERPNT 27 (SUTURE) ×2 IMPLANT
SUT VIC AB 2-0 CT2 27 (SUTURE) ×9 IMPLANT
SUT VIC AB 3-0 PS2 18 (SUTURE) ×1
SUT VIC AB 3-0 PS2 18XBRD (SUTURE) ×2 IMPLANT
SUT VICRYL 0 TIES 12 18 (SUTURE) ×3 IMPLANT
SUT VICRYL 0 UR6 27IN ABS (SUTURE) ×3 IMPLANT
TOWEL OR 17X24 6PK STRL BLUE (TOWEL DISPOSABLE) ×6 IMPLANT
TRAY FOLEY CATH 14FR (SET/KITS/TRAYS/PACK) ×3 IMPLANT
TROCAR OPTI TIP 5M 100M (ENDOMECHANICALS) ×3 IMPLANT
TROCAR XCEL BLUNT TIP 100MML (ENDOMECHANICALS) ×3 IMPLANT
TROCAR XCEL NON-BLD 5MMX100MML (ENDOMECHANICALS) ×3 IMPLANT
TROCAR XCEL OPT SLVE 5M 100M (ENDOMECHANICALS) IMPLANT
TROCAR Z-THREAD FIOS 11X100 BL (TROCAR) ×3 IMPLANT
WARMER LAPAROSCOPE (MISCELLANEOUS) ×3 IMPLANT
WATER STERILE IRR 1000ML POUR (IV SOLUTION) ×6 IMPLANT

## 2012-09-18 NOTE — Anesthesia Postprocedure Evaluation (Signed)
  Anesthesia Post-op Note  Patient: Angelica Sharp  Procedure(s) Performed: Procedure(s) with comments: LAPAROSCOPIC ASSISTED VAGINAL HYSTERECTOMY (N/A) - BLADDER  DISTENTION  Patient Location: PACU  Anesthesia Type:General  Level of Consciousness: awake, alert  and oriented  Airway and Oxygen Therapy: Patient Spontanous Breathing  Post-op Pain: mild  Post-op Assessment: Post-op Vital signs reviewed, Patient's Cardiovascular Status Stable, Respiratory Function Stable, Patent Airway, No signs of Nausea or vomiting and Pain level controlled  Post-op Vital Signs: Reviewed and stable  Complications: No apparent anesthesia complications

## 2012-09-18 NOTE — Progress Notes (Signed)
C/o soreness.  +nausea with narcotic meds.  No CP/SOB. VSS.  AF.  UOP ~75cc/hr  Gen: A&O x 3 Abd: soft, ND, inc c/d/i Ext: SCDs  46yo POD#0 s/p LAVH -Continue pain and nausea management -D/C IVF and Foley in AM -Advance diet as tolerated -AM labs pending

## 2012-09-18 NOTE — Progress Notes (Signed)
No change to H&P. 

## 2012-09-18 NOTE — Preoperative (Signed)
Beta Blockers   Reason not to administer Beta Blockers:Not Applicable 

## 2012-09-18 NOTE — Anesthesia Preprocedure Evaluation (Addendum)
Anesthesia Evaluation  Patient identified by MRN, date of birth, ID band Patient awake    Reviewed: Allergy & Precautions, H&P , NPO status , Patient's Chart, lab work & pertinent test results, reviewed documented beta blocker date and time   History of Anesthesia Complications Negative for: history of anesthetic complications  Airway Mallampati: II      Dental  (+) Teeth Intact   Pulmonary shortness of breath (attributes to weight) and with exertion,  breath sounds clear to auscultation  Pulmonary exam normal       Cardiovascular Exercise Tolerance: Good hypertension, On Medications Rhythm:regular Rate:Normal  Chest pain work up 3/13 - "little bit of an irregular heartbeat", otherwise negative work-up, no management   Neuro/Psych DDD, back pain - no narcotics negative psych ROS   GI/Hepatic hiatal hernia, GERD-  Medicated,(+) Hepatitis -, CCyst on liver, fatty liver   Endo/Other  negative endocrine ROS  Renal/GU negative Renal ROS Bladder dysfunction Female GU complaint     Musculoskeletal   Abdominal   Peds  Hematology negative hematology ROS (+)   Anesthesia Other Findings   Reproductive/Obstetrics negative OB ROS                          Anesthesia Physical Anesthesia Plan  ASA: II  Anesthesia Plan: General ETT   Post-op Pain Management:    Induction:   Airway Management Planned:   Additional Equipment:   Intra-op Plan:   Post-operative Plan:   Informed Consent: I have reviewed the patients History and Physical, chart, labs and discussed the procedure including the risks, benefits and alternatives for the proposed anesthesia with the patient or authorized representative who has indicated his/her understanding and acceptance.   Dental Advisory Given  Plan Discussed with: CRNA and Surgeon  Anesthesia Plan Comments:         Anesthesia Quick Evaluation

## 2012-09-18 NOTE — Addendum Note (Signed)
Addendum created 09/18/12 1529 by Armanda Heritage, RN   Modules edited: Anesthesia Events

## 2012-09-18 NOTE — Transfer of Care (Signed)
Immediate Anesthesia Transfer of Care Note  Patient: Angelica Sharp  Procedure(s) Performed: Procedure(s) with comments: LAPAROSCOPIC ASSISTED VAGINAL HYSTERECTOMY (N/A) - BLADDER  DISTENTION  Patient Location: PACU  Anesthesia Type:General  Level of Consciousness: awake and alert   Airway & Oxygen Therapy: Patient Spontanous Breathing and Patient connected to nasal cannula oxygen  Post-op Assessment: Report given to PACU RN  Post vital signs: Reviewed  Complications: No apparent anesthesia complications

## 2012-09-18 NOTE — Op Note (Signed)
PROCEDURE DATE: 09/18/12 PREOPERATIVE DIAGNOSIS: Uterine prolapse POSTOPERATIVE DIAGNOSIS: The same  PROCEDURE: Laparoscopic Assisted Vaginal Hysterectomy  SURGEON: Dr. Mitchel Honour  ASSISTANT: Dr. Richardean Chimera  INDICATIONS: 47 y.o. G3P3 with symptomatic uterine prolapse desiring definitive surgical management. Risks of surgery were discussed with the patient including but not limited to: bleeding which may require transfusion or reoperation; infection which may require antibiotics; injury to bowel, bladder, ureters or other surrounding organs; need for additional procedures including laparotomy; thromboembolic phenomenon, incisional problems and other postoperative/anesthesia complications. Written informed consent was obtained.   FINDINGS: Small uterus, normal adnexa bilaterally. No evidence of endometriosis. Normal upper abdomen.   ANESTHESIA: General  ESTIMATED BLOOD LOSS: 500 ml  SPECIMENS: Uterus and cervix  COMPLICATIONS: None immediate   PROCEDURE IN DETAIL: The patient received intravenous antibiotics and had sequential compression devices applied to her lower extremities while in the preoperative area. She was then taken to the operating room where general anesthesia was administered and was found to be adequate. She was placed in the dorsal lithotomy position, and was prepped and draped in a sterile manner. An in and out catheterization was performed. A uterine manipulator was then advanced into the uterus . After an adequate timeout was performed, attention was then turned to the patient's abdomen where a 10-mm skin incision was made in the umbilical fold. The Veress needle was carefully introduced into the peritoneal cavity through the abdominal wall. Intraperitoneal placement was confirmed by drop in intraabdominal pressure with insufflation of carbon dioxide gas. Adequate pneumoperitoneum was obtained, and the 10/11 XL trocar and sleeve were then advanced without difficulty into the  abdomen where intraabdominal placement was confirmed by the laparoscope. A survey of the patient's pelvis and abdomen revealed entirely normal anatomy. Suprapubic 5 XL port was then placed under direct visualization. The pelvis was then carefully examined. On the right side, the round ligament was then clamped and transected with the Gyrus. The uteroovarian ligament was also clamped and transected. The leaves of the broad ligament were separated and serially transected. These procedures were then repeated on the left side. The ureters were noted to be safely away from the area of dissection.   At this point, attention was turned to the vaginal portion of the case. A weighted speculum was placed posteriorly, a Deaver anteriorly, and the cervix grasped with a thyroid tenaculum. Once the anterior and posterior reflections were identified, the cervix was circumscribed using the Bovie knife. Next, using Mayos, the posterior cul-de-sac was entered. The LigaSure was then used to grasp the uterosacrals which were coapted and cut. Next, the bladder reflection was identified. Using Metzenbaums, it was entered and palpation and direct visualization confirmed proper location. Next, using the LigaSure, the uterine arteries were coapted and cut bilaterally. The pedicles were visualized after coaptation and were hemostatic. The same was performed sequentially cephalad until the uterus and cervix were removed. The pedicles were inspected and found to be hemostatic.   Next, the uterosacrals were tagged with monocryl bilaterally. The posterior peritoneum was closed using monocryl in a purse-string fashion. The uterosacrals were brought together in the midline cuff closure with a figure-of-8 stitch using monocryl followed by the remainder of the cuff closure in the same fashion. The cuff was inspected and found to be hemostatic.   Attention was returned to the abdomen were a second laparoscopic look was taken. All pedicles were  hemostatic.  The bladder was back-filled with sterile milk and noted to be intact.  Insufflation was removed after all  instruments were removed.   All skin incisions were closed with 4-0 Vicryl subcuticular stitches and Dermabond. The patient tolerated the procedures well. All instruments, needles, and sponge counts were correct x 2. The patient was taken to the recovery room awake, extubated and in stable condition.

## 2012-09-18 NOTE — Anesthesia Postprocedure Evaluation (Signed)
  Anesthesia Post-op Note  Patient: Angelica Sharp  Procedure(s) Performed: Procedure(s) with comments: LAPAROSCOPIC ASSISTED VAGINAL HYSTERECTOMY (N/A) - BLADDER  DISTENTION  Patient Location: PACU and Women's Unit  Anesthesia Type:General  Level of Consciousness: awake, alert  and oriented  Airway and Oxygen Therapy: Patient Spontanous Breathing  Post-op Pain: mild  Post-op Assessment: Post-op Vital signs reviewed  Post-op Vital Signs: Reviewed and stable  Complications: No apparent anesthesia complications

## 2012-09-19 ENCOUNTER — Encounter (HOSPITAL_COMMUNITY): Payer: Self-pay | Admitting: Obstetrics & Gynecology

## 2012-09-19 LAB — CBC
Hemoglobin: 8.8 g/dL — ABNORMAL LOW (ref 12.0–15.0)
MCH: 24.7 pg — ABNORMAL LOW (ref 26.0–34.0)
MCHC: 31.4 g/dL (ref 30.0–36.0)
MCV: 78.7 fL (ref 78.0–100.0)
RBC: 3.56 MIL/uL — ABNORMAL LOW (ref 3.87–5.11)
WBC: 11.5 10*3/uL — ABNORMAL HIGH (ref 4.0–10.5)

## 2012-09-19 LAB — BASIC METABOLIC PANEL
BUN: 8 mg/dL (ref 6–23)
CO2: 28 mEq/L (ref 19–32)
Calcium: 8.5 mg/dL (ref 8.4–10.5)
Creatinine, Ser: 0.91 mg/dL (ref 0.50–1.10)
GFR calc non Af Amer: 73 mL/min — ABNORMAL LOW (ref >90)
Glucose, Bld: 130 mg/dL — ABNORMAL HIGH (ref 70–99)
Sodium: 140 mEq/L (ref 135–145)

## 2012-09-19 MED ORDER — IBUPROFEN 600 MG PO TABS: 600.0000 mg | ORAL_TABLET | Freq: Four times a day (QID) | ORAL | Status: DC | PRN

## 2012-09-19 MED ORDER — OXYCODONE-ACETAMINOPHEN 5-325 MG PO TABS: 1.0000 | ORAL_TABLET | ORAL | Status: DC | PRN

## 2012-09-19 NOTE — Progress Notes (Signed)
C/O vaginal soreness.  No CP/SOB.  Scant VB.  No more n/v since yesterday.  Requesting to go home.  No flatus.  Foley in.  Tolerating po.  VSS.  AF. UOP~100cc/hr Hgb 11.2->8.8; labs stable  Gen: A&Ox3 Abd: soft, ND, inc c/d/i Ext: SCDs  46yo POD#1 s/p LAVH -Will d/c foley and ivf.   -Continue pain meds -Will d/c home if continues to meet all postop goals

## 2012-09-19 NOTE — Progress Notes (Signed)
Pt is discharged in rhe care of daughter. Downstairs per ambulatory. Denies any pain or discomfort. Lapsites are clean and dry. Pt comprehend all instructions well. Questions were asked and answered.

## 2012-09-19 NOTE — Discharge Summary (Signed)
Physician Discharge Summary  Patient ID: Angelica Sharp MRN: [id number] DOB/AGE: 1965-07-04 47 y.o.  Admit date: 09/18/2012 Discharge date: 09/19/2012  Admission Diagnoses: Uterine prolapse  Discharge Diagnoses: same Active Problems:   * No active hospital problems. *   Discharged Condition: good  Hospital Course: 47yo G3P3 with uterine prolapse desiring definitive management admitted with plans for LAVH.  This procedure was performed without complication.  Pt did extremely well and met all postoperative goals.  She was discharged home on POD#1.    Consults: None  Significant Diagnostic Studies: labs: Hgb 8.8  Treatments: IV hydration, analgesia: Percocet and surgery: LAVH  Discharge Exam: Blood pressure 102/65, pulse 67, temperature 97.7 F (36.5 C), temperature source Oral, resp. rate 17, height 5\' 7"  (1.702 m), weight 210 lb (95.255 kg), last menstrual period 08/10/2012, SpO2 93.00%. General appearance: alert, cooperative and appears stated age GI: soft, non-tender; bowel sounds normal; no masses,  no organomegaly Extremities: extremities normal, atraumatic, no cyanosis or edema Incision/Wound: c/d/i x 2  Disposition: 01-Home or Self Care     Medication List    TAKE these medications       ALPRAZolam 0.25 MG tablet  Commonly known as:  XANAX  Take 1 tablet (0.25 mg total) by mouth at bedtime as needed. For anxiety     CYSTEX 162-162.5 MG Tabs  Generic drug:  Methenamine-Sodium Salicylate  Take 2 tablets by mouth 2 (two) times daily as needed (urinary pain).     docusate sodium 100 MG capsule  Commonly known as:  COLACE  Take 200 mg by mouth at bedtime. For stool softner     hydrochlorothiazide 25 MG tablet  Commonly known as:  HYDRODIURIL  Take 25 mg by mouth every other day.     ibuprofen 200 MG tablet  Commonly known as:  ADVIL,MOTRIN  Take 800 mg by mouth daily as needed for pain.     ibuprofen 600 MG tablet  Commonly known as:  ADVIL  Take 1 tablet  (600 mg total) by mouth every 6 (six) hours as needed for pain.     multivitamin with minerals Tabs  Take 1 tablet by mouth daily.     naproxen sodium 220 MG tablet  Commonly known as:  ANAPROX  Take 440 mg by mouth daily as needed (alternates with ibuprofen for pain).     omeprazole 40 MG capsule  Commonly known as:  PRILOSEC  Take 1 capsule (40 mg total) by mouth daily.     oxyCODONE-acetaminophen 5-325 MG per tablet  Commonly known as:  PERCOCET/ROXICET  Take 1-2 tablets by mouth every 4 (four) hours as needed.     Potassium Gluconate 550 MG Tabs  Take 1 tablet by mouth daily.     URISED PO  Take 2 tablets by mouth 2 (two) times daily as needed (alternates with cystex OTC for urinary pain).     VITAMIN D PO  Take 1 capsule by mouth daily.         SignedMitchel Honour 09/19/2012, 8:12 AM

## 2012-10-16 ENCOUNTER — Other Ambulatory Visit: Payer: Self-pay | Admitting: Physician Assistant

## 2013-01-20 ENCOUNTER — Encounter: Payer: Self-pay | Admitting: Radiology

## 2013-03-24 ENCOUNTER — Ambulatory Visit (INDEPENDENT_AMBULATORY_CARE_PROVIDER_SITE_OTHER): Payer: No Typology Code available for payment source | Admitting: Physician Assistant

## 2013-03-24 VITALS — BP 126/82 | HR 103 | Temp 98.5°F | Resp 18 | Ht 67.5 in | Wt 217.0 lb

## 2013-03-24 DIAGNOSIS — K5792 Diverticulitis of intestine, part unspecified, without perforation or abscess without bleeding: Secondary | ICD-10-CM

## 2013-03-24 DIAGNOSIS — R109 Unspecified abdominal pain: Secondary | ICD-10-CM

## 2013-03-24 DIAGNOSIS — Z23 Encounter for immunization: Secondary | ICD-10-CM

## 2013-03-24 DIAGNOSIS — K5732 Diverticulitis of large intestine without perforation or abscess without bleeding: Secondary | ICD-10-CM

## 2013-03-24 DIAGNOSIS — R110 Nausea: Secondary | ICD-10-CM

## 2013-03-24 LAB — POCT UA - MICROSCOPIC ONLY
Casts, Ur, LPF, POC: NEGATIVE
Crystals, Ur, HPF, POC: NEGATIVE
Mucus, UA: NEGATIVE
Yeast, UA: NEGATIVE

## 2013-03-24 LAB — POCT URINALYSIS DIPSTICK
Bilirubin, UA: NEGATIVE
Blood, UA: NEGATIVE
Glucose, UA: NEGATIVE
Ketones, UA: NEGATIVE
Leukocytes, UA: NEGATIVE
Nitrite, UA: NEGATIVE
Protein, UA: NEGATIVE
Spec Grav, UA: <=1.005
Urobilinogen, UA: 0.2
pH, UA: 5.5

## 2013-03-24 LAB — POCT CBC
Granulocyte percent: 82.9 %G — AB (ref 37–80)
HCT, POC: 39.8 % (ref 37.7–47.9)
Hemoglobin: 12.4 g/dL (ref 12.2–16.2)
Lymph, poc: 1.5 (ref 0.6–3.4)
MCH, POC: 25.3 pg — AB (ref 27–31.2)
MCHC: 31.2 g/dL — AB (ref 31.8–35.4)
MCV: 81.3 fL (ref 80–97)
MID (cbc): 0.4 (ref 0–0.9)
MPV: 8.7 fL (ref 0–99.8)
POC Granulocyte: 9.3 — AB (ref 2–6.9)
POC LYMPH PERCENT: 13.5 %L (ref 10–50)
POC MID %: 3.6 %M (ref 0–12)
Platelet Count, POC: 343 10*3/uL (ref 142–424)
RBC: 4.9 M/uL (ref 4.04–5.48)
RDW, POC: 16.3 %
WBC: 11.2 10*3/uL — AB (ref 4.6–10.2)

## 2013-03-24 MED ORDER — METRONIDAZOLE 500 MG PO TABS: 500.0000 mg | ORAL_TABLET | Freq: Three times a day (TID) | ORAL | Status: DC

## 2013-03-24 MED ORDER — CIPROFLOXACIN HCL 500 MG PO TABS: 500.0000 mg | ORAL_TABLET | Freq: Two times a day (BID) | ORAL | Status: DC

## 2013-03-24 MED ORDER — AMOXICILLIN-POT CLAVULANATE 875-125 MG PO TABS: 1.0000 | ORAL_TABLET | Freq: Two times a day (BID) | ORAL | Status: DC

## 2013-03-24 NOTE — Progress Notes (Signed)
Subjective:    Patient ID: Angelica Sharp, female    DOB: 1965/12/21, 47 y.o.   MRN: [id number]  HPI 47 year old female presents with lower abdominal pain x 3 weeks. Has been intermittent in nature but for the last 2 days it has gotten much worse. Hx of diverticulitis 2 years ago. Admits this feels similar to that. Her last episode she had a fever and had to be admitted to the hospital for 3 days. Subsequently had a colonoscopy after treatment and had multiple diverticulum.  Was told that if she had more than 3 episodes in 1 year she would have to have colon resection. She has not had any flares since then. Uses stool softener daily and watches her diet very carefully.  Denies dysuria, urinary frequency, constipation, emesis, fever, chills, or dizziness.  Has had slight nausea and 2-3 loose stools along with slight urinary hesitancy.  Had hysterectomy in 09/2012.   Patient is otherwise doing well with no other concerns today.     Review of Systems  Constitutional: Negative for fever and chills.  Gastrointestinal: Positive for nausea and abdominal pain (lower). Negative for vomiting.  Genitourinary: Negative for dysuria, flank pain, vaginal discharge and pelvic pain.  Skin: Negative for rash.  Neurological: Negative for dizziness.       Objective:   Physical Exam  Constitutional: She is oriented to person, place, and time. She appears well-developed and well-nourished.  HENT:  Head: Normocephalic and atraumatic.  Right Ear: External ear normal.  Left Ear: External ear normal.  Eyes: Conjunctivae are normal.  Neck: Normal range of motion.  Cardiovascular: Normal rate, regular rhythm and normal heart sounds.   Pulmonary/Chest: Effort normal.  Abdominal: There is tenderness in the periumbilical area, suprapubic area and left lower quadrant. There is no rigidity, no rebound, no guarding, no CVA tenderness and no tenderness at McBurney's point.  Neurological: She is alert and oriented to  person, place, and time.  Psychiatric: She has a normal mood and affect. Her behavior is normal. Judgment and thought content normal.      Results for orders placed in visit on 03/24/13  POCT URINALYSIS DIPSTICK      Result Value Range   Color, UA yellow     Clarity, UA clear     Glucose, UA neg     Bilirubin, UA neg     Ketones, UA neg     Spec Grav, UA <=1.005     Blood, UA neg     pH, UA 5.5     Protein, UA neg     Urobilinogen, UA 0.2     Nitrite, UA neg     Leukocytes, UA Negative    POCT UA - MICROSCOPIC ONLY      Result Value Range   WBC, Ur, HPF, POC 0-1     RBC, urine, microscopic 1-3     Bacteria, U Microscopic trace     Mucus, UA neg     Epithelial cells, urine per micros 0-1     Crystals, Ur, HPF, POC neg     Casts, Ur, LPF, POC neg     Yeast, UA neg    POCT CBC      Result Value Range   WBC 11.2 (*) 4.6 - 10.2 K/uL   Lymph, poc 1.5  0.6 - 3.4   POC LYMPH PERCENT 13.5  10 - 50 %L   MID (cbc) 0.4  0 - 0.9   POC MID % 3.6  0 - 12 %M   POC Granulocyte 9.3 (*) 2 - 6.9   Granulocyte percent 82.9 (*) 37 - 80 %G   RBC 4.90  4.04 - 5.48 M/uL   Hemoglobin 12.4  12.2 - 16.2 g/dL   HCT, POC 16.1  09.6 - 47.9 %   MCV 81.3  80 - 97 fL   MCH, POC 25.3 (*) 27 - 31.2 pg   MCHC 31.2 (*) 31.8 - 35.4 g/dL   RDW, POC 04.5     Platelet Count, POC 343  142 - 424 K/uL   MPV 8.7  0 - 99.8 fL        Assessment & Plan:  Abdominal pain, lower, unspecified laterality - Plan: POCT urinalysis dipstick, POCT UA - Microscopic Only, POCT CBC, Urine culture  Diverticulitis - Plan: metroNIDAZOLE (FLAGYL) 500 MG tablet, amoxicillin-clavulanate (AUGMENTIN) 875-125 MG per tablet, ciprofloxacin (CIPRO) 500 MG tablet  Need for prophylactic vaccination and inoculation against influenza - Plan: Flu Vaccine QUAD 36+ mos IM  Urine culture sent Start antibiotics Augmentin 875 mg bid x 10 days Flagyl 500 mg tid x 10 days Cipro 500 mg bid x 10 days RTC or go to ER with any worsening  symptoms including development of fever, worsening abdominal pain, or vomiting.

## 2013-03-26 LAB — URINE CULTURE: Colony Count: >=100000

## 2013-04-03 ENCOUNTER — Telehealth: Payer: Self-pay

## 2013-04-03 MED ORDER — ONDANSETRON HCL 4 MG PO TABS: 4.0000 mg | ORAL_TABLET | Freq: Three times a day (TID) | ORAL | Status: DC | PRN

## 2013-04-03 NOTE — Telephone Encounter (Signed)
PT seen this week for diverticulitis. Antibiotics are upsetting her stomach, would like zofran  [facility] [address] in [place]  Pt [phone]

## 2013-04-03 NOTE — Telephone Encounter (Signed)
Thanks. Patient advised.  

## 2013-04-03 NOTE — Telephone Encounter (Signed)
Zofran sent to pharmacy

## 2013-04-18 ENCOUNTER — Other Ambulatory Visit: Payer: Self-pay | Admitting: Family Medicine

## 2013-05-26 ENCOUNTER — Telehealth: Payer: Self-pay

## 2013-05-26 NOTE — Telephone Encounter (Signed)
Pharm requests RF of alprazolam 0.25 mg 1 tab QD #30. Last filled 03/20/13 at another pharmacy. Dr Audria Nine I pended it for your review.

## 2013-05-27 MED ORDER — ALPRAZOLAM 0.25 MG PO TABS: ORAL_TABLET | ORAL | Status: DC

## 2013-05-27 NOTE — Telephone Encounter (Signed)
Advised pt needs ov for more RFs. Pt agreed.

## 2013-05-27 NOTE — Telephone Encounter (Signed)
[facility] refill ph[facility] to [facility] in [place], [place].  Pt will need OV to get additional refills. Last seen by me in March 2014.

## 2013-08-31 ENCOUNTER — Telehealth: Payer: Self-pay | Admitting: Cardiovascular Disease

## 2013-08-31 ENCOUNTER — Encounter: Payer: Self-pay | Admitting: *Deleted

## 2013-08-31 DIAGNOSIS — R922 Inconclusive mammogram: Secondary | ICD-10-CM | POA: Insufficient documentation

## 2013-08-31 NOTE — Telephone Encounter (Signed)
New message  Pt called states that her BP was 67/46 laying down when she sat up it raised to 116/67 on 08/28/2013.Marland Kitchen She has pain in her chest and left shoulder blade as well as dizziness spontaneously. She also mentions that she has a small episodes of SOB.Marland Kitchen request a call back to determine if an appt is needed.

## 2013-08-31 NOTE — Telephone Encounter (Signed)
Pt called because she said that since this past Friday she has been having some shoulder pain and some chest pressure on and off with activity score of "3" and some dizziness .Pt day  is at work and when she get up from the chair have some SOB and all this activity it wears her out. Pt was seen by Dr. Johnsie Cancel in 07/03/11 and to make an appointment in a year. Pt is now call to see if she needs to be seen by a cardiology. An appointment was offered for Thursday 3/26.15  At 2;pm with Dr. Johnsie Cancel. Pt states she will call her PCP to see if they can Do an EKG and a chest xray to see because she thinks that she may has Pneumonia. Pt is to call back if she needs appointment with Dr. Johnsie Cancel.

## 2013-09-10 ENCOUNTER — Encounter: Payer: Self-pay | Admitting: Family Medicine

## 2013-09-10 ENCOUNTER — Ambulatory Visit (INDEPENDENT_AMBULATORY_CARE_PROVIDER_SITE_OTHER): Payer: BC Managed Care – PPO | Admitting: Family Medicine

## 2013-09-10 VITALS — BP 120/82 | HR 69 | Temp 98.5°F | Resp 16 | Ht 67.0 in | Wt 217.8 lb

## 2013-09-10 DIAGNOSIS — R739 Hyperglycemia, unspecified: Secondary | ICD-10-CM

## 2013-09-10 DIAGNOSIS — Z0000 Encounter for general adult medical examination without abnormal findings: Secondary | ICD-10-CM

## 2013-09-10 DIAGNOSIS — R7309 Other abnormal glucose: Secondary | ICD-10-CM

## 2013-09-10 DIAGNOSIS — K5730 Diverticulosis of large intestine without perforation or abscess without bleeding: Secondary | ICD-10-CM

## 2013-09-10 DIAGNOSIS — E785 Hyperlipidemia, unspecified: Secondary | ICD-10-CM | POA: Insufficient documentation

## 2013-09-10 LAB — LIPID PANEL
CHOL/HDL RATIO: 5.2 ratio
CHOLESTEROL: 192 mg/dL (ref 0–200)
HDL: 37 mg/dL — AB (ref >39)
LDL Cholesterol: 115 mg/dL — ABNORMAL HIGH (ref 0–99)
TRIGLYCERIDES: 202 mg/dL — AB (ref <150)
VLDL: 40 mg/dL (ref 0–40)

## 2013-09-10 LAB — COMPLETE METABOLIC PANEL WITH GFR
ALK PHOS: 91 U/L (ref 39–117)
ALT: 43 U/L — ABNORMAL HIGH (ref 0–35)
AST: 32 U/L (ref 0–37)
Albumin: 4 g/dL (ref 3.5–5.2)
BILIRUBIN TOTAL: 0.4 mg/dL (ref 0.2–1.2)
BUN: 9 mg/dL (ref 6–23)
CO2: 28 mEq/L (ref 19–32)
CREATININE: 0.8 mg/dL (ref 0.50–1.10)
Calcium: 8.9 mg/dL (ref 8.4–10.5)
Chloride: 102 mEq/L (ref 96–112)
GFR, EST NON AFRICAN AMERICAN: 87 mL/min
GFR, Est African American: >89 mL/min
Glucose, Bld: 107 mg/dL — ABNORMAL HIGH (ref 70–99)
POTASSIUM: 4.3 meq/L (ref 3.5–5.3)
Sodium: 137 mEq/L (ref 135–145)
Total Protein: 7.4 g/dL (ref 6.0–8.3)

## 2013-09-10 LAB — POCT GLYCOSYLATED HEMOGLOBIN (HGB A1C): HEMOGLOBIN A1C: 6.5

## 2013-09-10 LAB — POCT URINALYSIS DIPSTICK
BILIRUBIN UA: NEGATIVE
Blood, UA: NEGATIVE
GLUCOSE UA: NEGATIVE
KETONES UA: NEGATIVE
LEUKOCYTES UA: NEGATIVE
Nitrite, UA: NEGATIVE
PH UA: 7
Protein, UA: NEGATIVE
Spec Grav, UA: 1.015
Urobilinogen, UA: 0.2

## 2013-09-10 MED ORDER — ESOMEPRAZOLE MAGNESIUM 40 MG PO CPDR: 40.0000 mg | DELAYED_RELEASE_CAPSULE | Freq: Every day | ORAL | Status: DC

## 2013-09-10 MED ORDER — HYDROCHLOROTHIAZIDE 25 MG PO TABS: 25.0000 mg | ORAL_TABLET | Freq: Every day | ORAL | Status: DC

## 2013-09-10 MED ORDER — ALPRAZOLAM 0.25 MG PO TABS: ORAL_TABLET | ORAL | Status: DC

## 2013-09-10 NOTE — Progress Notes (Signed)
Subjective:    Patient ID: Angelica Sharp, female    DOB: 04-Sep-1965, 48 y.o.   MRN: [id number]  HPI  This 48 y.o. Cauc female is here for CPE. Pt underwent a LAP Vag Hysterectomy April 2014. Diverticular flare in the fall and GI management- CRS current. Known ot have elevated blood sugar and lipids; plans to focus on nutrition and physical activity.  HCM: CRS- As noted.           MMG- Current.           IMM- Current.  Patient Active Problem List   Diagnosis Date Noted  . 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 Other and unspecified hyperlipidemia 09/10/2013  . Impaired glucose metabolism 09/10/2013  . Inconclusive mammogram 08/31/2013  . Family history of cancer 08/30/2012  . GERD (gastroesophageal reflux disease) 07/03/2011  . COUGH 04/18/2010  . EDEMA 01/24/2010  . CHEST PAIN 01/24/2010   Prior to Admission medications   Medication Sig Start Date End Date Taking? Authorizing Provider  ALPRAZolam Duanne Moron) 0.25 MG tablet TAKE 1 TABLET EVERY DAY   Yes Barton Fanny, MD  docusate sodium (COLACE) 100 MG capsule Take 200 mg by mouth at bedtime. For stool softner   Yes Historical Provider, MD  hydrochlorothiazide (HYDRODIURIL) 25 MG tablet Take 25 mg by mouth every other day. 08/22/12  Yes Barton Fanny, MD  ibuprofen (ADVIL) 600 MG tablet Take 1 tablet (600 mg total) by mouth every 6 (six) hours as needed for pain. 09/19/12  Yes Megan Morris, DO  Multiple Vitamin (MULITIVITAMIN WITH MINERALS) TABS Take 1 tablet by mouth daily.   Yes Historical Provider, MD  naproxen sodium (ANAPROX) 220 MG tablet Take 440 mg by mouth daily as needed (alternates with ibuprofen for pain).   Yes Historical Provider, MD  Potassium Gluconate 550 MG TABS Take 1 tablet by mouth daily.   Yes Historical Provider, MD  Cholecalciferol (VITAMIN D PO) Take 1 capsule by mouth daily.    Historical Provider, MD  esomeprazole (NEXIUM) 40 MG capsule Take 1 capsule (40 mg total) by mouth daily.    Barton Fanny, MD  hydrochlorothiazide (HYDRODIURIL) 25 MG tablet Take 1 tablet (25 mg total) by mouth daily.    Barton Fanny, MD  Methen-Bella-Meth Bl-Phen Sal (URISED PO) Take 2 tablets by mouth 2 (two) times daily as needed (alternates with cystex OTC for urinary pain).    Historical Provider, MD  Methenamine-Sodium Salicylate (CYSTEX) 081-448.1 MG TABS Take 2 tablets by mouth 2 (two) times daily as needed (urinary pain).    Historical Provider, MD  metroNIDAZOLE (FLAGYL) 500 MG tablet Take 1 tablet (500 mg total) by mouth 3 (three) times daily. DO NOT CONSUME ALCOHOL WHILE TAKING THIS MEDICATION. 03/24/13   Heather Elnora Morrison, PA-C  ondansetron (ZOFRAN) 4 MG tablet Take 1 tablet (4 mg total) by mouth every 8 (eight) hours as needed for nausea. 04/03/13   Eleanore Kurtis Bushman, PA-C  oxyCODONE-acetaminophen (PERCOCET/ROXICET) 5-325 MG per tablet Take 1-2 tablets by mouth every 4 (four) hours as needed. 09/19/12   Linda Hedges, DO   PMHx, Surg Hx, Soc and Fam Hx reviewed.   Review of Systems  Constitutional: Positive for activity change. Negative for fatigue and unexpected weight change.  HENT: Negative.   Eyes: Negative.   Respiratory: Negative.   Cardiovascular: Negative.   Gastrointestinal: Positive for abdominal pain and diarrhea. Negative for rectal pain.  Endocrine: Negative.   Genitourinary: Negative.   Musculoskeletal: Negative.  Allergic/Immunologic: Negative.   Neurological: Negative.   Hematological: Negative.   Psychiatric/Behavioral: Positive for sleep disturbance. Negative for behavioral problems, dysphoric mood and agitation. The patient is nervous/anxious.        Objective:   Physical Exam  Nursing note and vitals reviewed. Constitutional: She is oriented to person, place, and time. Vital signs are normal. She appears well-developed and well-nourished.  HENT:  Head: Normocephalic and atraumatic.  Right Ear: Hearing, tympanic membrane, external ear and ear canal normal.   Left Ear: Hearing, tympanic membrane, external ear and ear canal normal.  Nose: Nose normal. No nasal deformity or septal deviation.  Mouth/Throat: Uvula is midline, oropharynx is clear and moist and mucous membranes are normal. No oral lesions. Normal dentition. No dental caries.  Eyes: Conjunctivae, EOM and lids are normal. Pupils are equal, round, and reactive to light. No scleral icterus.  Vision eval by eye care professional.  Neck: Trachea normal, normal range of motion and full passive range of motion without pain. Neck supple. No JVD present. No spinous process tenderness and no muscular tenderness present. Carotid bruit is not present. No mass and no thyromegaly present.  Cardiovascular: Normal rate, regular rhythm, S1 normal, S2 normal, normal heart sounds, intact distal pulses and normal pulses.   No extrasystoles are present. PMI is not displaced.  Exam reveals no gallop and no friction rub.   No murmur heard. Pulmonary/Chest: Effort normal and breath sounds normal. No respiratory distress.  Abdominal: Soft. Normal appearance and bowel sounds are normal. She exhibits no distension, no pulsatile midline mass and no mass. There is no hepatosplenomegaly. There is tenderness in the suprapubic area and left lower quadrant. There is no rigidity, no rebound, no guarding and no CVA tenderness.  Musculoskeletal: Normal range of motion. She exhibits no edema and no tenderness.       Cervical back: Normal.       Thoracic back: Normal.       Lumbar back: Normal.  Lymphadenopathy:       Head (right side): No submental, no submandibular, no tonsillar, no preauricular, no posterior auricular and no occipital adenopathy present.       Head (left side): No submental, no submandibular, no tonsillar, no preauricular, no posterior auricular and no occipital adenopathy present.    She has no cervical adenopathy.       Right: No inguinal and no supraclavicular adenopathy present.       Left: No inguinal  and no supraclavicular adenopathy present.  Neurological: She is alert and oriented to person, place, and time. She has normal strength. She displays no atrophy and no tremor. No cranial nerve deficit or sensory deficit. She exhibits normal muscle tone. She displays a negative Romberg sign. Coordination and gait normal. She displays no Babinski's sign on the right side. She displays no Babinski's sign on the left side.  Reflex Scores:      Tricep reflexes are 1+ on the right side and 1+ on the left side.      Bicep reflexes are 1+ on the right side and 1+ on the left side.      Brachioradialis reflexes are 1+ on the right side and 1+ on the left side.      Patellar reflexes are 1+ on the right side and 1+ on the left side. Skin: Skin is warm, dry and intact. No bruising, no lesion and no rash noted. She is not diaphoretic. No cyanosis or erythema. No pallor. Nails show no clubbing.  Sin  tag- L lateral neck/upper chest area; pt desires removal but will call back to schedule.  Psychiatric: She has a normal mood and affect. Her speech is normal and behavior is normal. Judgment and thought content normal. Cognition and memory are normal.    Results for orders placed in visit on 09/10/13  POCT URINALYSIS DIPSTICK      Result Value Ref Range   Color, UA yellow     Clarity, UA clear     Glucose, UA neg     Bilirubin, UA neg     Ketones, UA neg     Spec Grav, UA 1.015     Blood, UA neg     pH, UA 7.0     Protein, UA neg     Urobilinogen, UA 0.2     Nitrite, UA neg     Leukocytes, UA Negative    POCT GLYCOSYLATED HEMOGLOBIN (HGB A1C)      Result Value Ref Range   Hemoglobin A1C 6.5        Assessment & Plan:  Routine general medical examination at a health care facility - Plan: POCT urinalysis dipstick, Lipid panel  Other and unspecified hyperlipidemia - Advised dietary restrictions; pt would prefer to focus on TLCs and will take fish oil supplement.   Plan: Thyroid Panel With TSH, COMPLETE  METABOLIC PANEL WITH GFR  Hyperglycemia - Nutritional modifications. Increase physical activity. Plan: POCT glycosylated hemoglobin (Hb A1C)  Impaired glucose metabolism- As above.  Recheck A1c in 6 months.  Diverticular disease of colon- Stable.  Meds ordered this encounter  Medications  . ALPRAZolam (XANAX) 0.25 MG tablet    Sig: TAKE 1 TABLET EVERY DAY    Dispense:  30 tablet    Refill:  1  . hydrochlorothiazide (HYDRODIURIL) 25 MG tablet    Sig: Take 1 tablet (25 mg total) by mouth daily.    Dispense:  30 tablet    Refill:  5  . esomeprazole (NEXIUM) 40 MG capsule    Sig: Take 1 capsule (40 mg total) by mouth daily.    Dispense:  30 capsule    Refill:  5    BRAND MEDICALLY NECESSARY.

## 2013-09-10 NOTE — Patient Instructions (Addendum)
Keeping You Healthy  Get These Tests 1. Blood Pressure- Have your blood pressure checked once a year by your health care provider.  Normal blood pressure is 120/80. 2. Weight- Have your body mass index (BMI) calculated to screen for obesity.  BMI is measure of body fat based on height and weight.  You can also calculate your own BMI at [email or web address]. 3. Cholesterol- Have your cholesterol checked every 5 years starting at age 50 then yearly starting at age 85. 63. Chlamydia, HIV, and other sexually transmitted diseases- Get screened every year until age 87, then within three months of each new sexual provider. 5. Pap Smear- Every 1-3 years; discuss with your health care provider. 6. Mammogram- Every year starting at age 27  Take these medicines  Calcium with Vitamin D-Your body needs 1200 mg of Calcium each day and [phone] IU of Vitamin D daily.  Your body can only absorb 500 mg of Calcium at a time so Calcium must be taken in 2 or 3 divided doses throughout the day.  Multivitamin with folic acid- Once daily if it is possible for you to become pregnant.  Get these Immunizations  Tetanus shot- Every 10 years.  Tdap was given in 2012; next Tetanus due in 2022.  Flu shot-Every year.  Take these steps 1. Do not smoke-Your healthcare provider can help you quit.  For tips on how to quit go to www.smokefree.gov or call 1-800 QUITNOW. 2. Be physically active- Exercise 5 days a week for at least 30 minutes.  If you are not already physically active, start slow and gradually work up to 30 minutes of moderate physical activity.  Examples of moderate activity include walking briskly, dancing, swimming, bicycling, etc. 3. Breast Cancer- A self breast exam every month is important for early detection of breast cancer.  For more information and instruction on self breast exams, ask your healthcare provider or https://www.patel.info/. 4. Eat a healthy diet- Eat a variety of  healthy foods such as fruits, vegetables, whole grains, low fat milk, low fat cheeses, yogurt, lean meats, poultry and fish, beans, nuts, tofu, etc.  For more information go to www. Thenutritionsource.org 5. Drink alcohol in moderation- Limit alcohol intake to one drink or less per day. Never drink and drive. 6. Depression- Your emotional health is as important as your physical health.  If you're feeling down or losing interest in things you normally enjoy please talk to your healthcare provider about being screened for depression. 7. Dental visit- Brush and floss your teeth twice daily; visit your dentist twice a year. 8. Eye doctor- Get an eye exam at least every 2 years. 9. Helmet use- Always wear a helmet when riding a bicycle, motorcycle, rollerblading or skateboarding. 76. Safe sex- If you may be exposed to sexually transmitted infections, use a condom. 11. Seat belts- Seat belts can save your live; always wear one. 12. Smoke/Carbon Monoxide detectors- These detectors need to be installed on the appropriate level of your home. Replace batteries at least once a year. 13. Skin cancer- When out in the sun please cover up and use sunscreen 15 SPF or higher. 14. Violence- If anyone is threatening or hurting you, please tell your healthcare provider.   Hypertriglyceridemia  Diet for High blood levels of Triglycerides Most fats in food are triglycerides. Triglycerides in your blood are stored as fat in your body. High levels of triglycerides in your blood may put you at a greater risk for heart disease and stroke.  Normal triglyceride  levels are less than 150 mg/dL. Borderline high levels are 150-199 mg/dl. High levels are 200 - 499 mg/dL, and very high triglyceride levels are greater than 500 mg/dL. The decision to treat high triglycerides is generally based on the level. For people with borderline or high triglyceride levels, treatment includes weight loss and exercise. Drugs are recommended for  people with very high triglyceride levels. Many people who need treatment for high triglyceride levels have metabolic syndrome. This syndrome is a collection of disorders that often include: insulin resistance, high blood pressure, blood clotting problems, high cholesterol and triglycerides. TESTING PROCEDURE FOR TRIGLYCERIDES  You should not eat 4 hours before getting your triglycerides measured. The normal range of triglycerides is between 10 and 250 milligrams per deciliter (mg/dl). Some people may have extreme levels (1000 or above), but your triglyceride level may be too high if it is above 150 mg/dl, depending on what other risk factors you have for heart disease.  People with high blood triglycerides may also have high blood cholesterol levels. If you have high blood cholesterol as well as high blood triglycerides, your risk for heart disease is probably greater than if you only had high triglycerides. High blood cholesterol is one of the main risk factors for heart disease. CHANGING YOUR DIET  Your weight can affect your blood triglyceride level. If you are more than 20% above your ideal body weight, you may be able to lower your blood triglycerides by losing weight. Eating less and exercising regularly is the best way to combat this. Fat provides more calories than any other food. The best way to lose weight is to eat less fat. Only 30% of your total calories should come from fat. Less than 7% of your diet should come from saturated fat. A diet low in fat and saturated fat is the same as a diet to decrease blood cholesterol. By eating a diet lower in fat, you may lose weight, lower your blood cholesterol, and lower your blood triglyceride level.  Eating a diet low in fat, especially saturated fat, may also help you lower your blood triglyceride level. Ask your dietitian to help you figure how much fat you can eat based on the number of calories your caregiver has prescribed for you.  Exercise, in  addition to helping with weight loss may also help lower triglyceride levels.   Alcohol can increase blood triglycerides. You may need to stop drinking alcoholic beverages.  Too much carbohydrate in your diet may also increase your blood triglycerides. Some complex carbohydrates are necessary in your diet. These may include bread, rice, potatoes, other starchy vegetables and cereals.  Reduce "simple" carbohydrates. These may include pure sugars, candy, honey, and jelly without losing other nutrients. If you have the kind of high blood triglycerides that is affected by the amount of carbohydrates in your diet, you will need to eat less sugar and less high-sugar foods. Your caregiver can help you with this.  Adding 2-4 grams of fish oil (EPA+ DHA) may also help lower triglycerides. Speak with your caregiver before adding any supplements to your regimen. Following the Diet  Maintain your ideal weight. Your caregivers can help you with a diet. Generally, eating less food and getting more exercise will help you lose weight. Joining a weight control group may also help. Ask your caregivers for a good weight control group in your area.  Eat low-fat foods instead of high-fat foods. This can help you lose weight too.  These foods are lower in  fat. Eat MORE of these:   Dried beans, peas, and lentils.  Egg whites.  Low-fat cottage cheese.  Fish.  Lean cuts of meat, such as round, sirloin, rump, and flank (cut extra fat off meat you fix).  Whole grain breads, cereals and pasta.  Skim and nonfat dry milk.  Low-fat yogurt.  Poultry without the skin.  Cheese made with skim or part-skim milk, such as mozzarella, parmesan, farmers', ricotta, or pot cheese. These are higher fat foods. Eat LESS of these:   Whole milk and foods made from whole milk, such as American, blue, cheddar, monterey jack, and swiss cheese  High-fat meats, such as luncheon meats, sausages, knockwurst, bratwurst, hot dogs,  ribs, corned beef, ground pork, and regular ground beef.  Fried foods. Limit saturated fats in your diet. Substituting unsaturated fat for saturated fat may decrease your blood triglyceride level. You will need to read package labels to know which products contain saturated fats.  These foods are high in saturated fat. Eat LESS of these:   Fried pork skins.  Whole milk.  Skin and fat from poultry.  Palm oil.  Butter.  Shortening.  Cream cheese.  Berniece Salines.  Margarines and baked goods made from listed oils.  Vegetable shortenings.  Chitterlings.  Fat from meats.  Coconut oil.  Palm kernel oil.  Lard.  Cream.  Sour cream.  Fatback.  Coffee whiteners and non-dairy creamers made with these oils.  Cheese made from whole milk. Use unsaturated fats (both polyunsaturated and monounsaturated) moderately. Remember, even though unsaturated fats are better than saturated fats; you still want a diet low in total fat.  These foods are high in unsaturated fat:   Canola oil.  Sunflower oil.  Mayonnaise.  Almonds.  Peanuts.  Pine nuts.  Margarines made with these oils.  Safflower oil.  Olive oil.  Avocados.  Cashews.  Peanut butter.  Sunflower seeds.  Soybean oil.  Peanut oil.  Olives.  Pecans.  Walnuts.  Pumpkin seeds. Avoid sugar and other high-sugar foods. This will decrease carbohydrates without decreasing other nutrients. Sugar in your food goes rapidly to your blood. When there is excess sugar in your blood, your liver may use it to make more triglycerides. Sugar also contains calories without other important nutrients.  Eat LESS of these:   Sugar, brown sugar, powdered sugar, jam, jelly, preserves, honey, syrup, molasses, pies, candy, cakes, cookies, frosting, pastries, colas, soft drinks, punches, fruit drinks, and regular gelatin.  Avoid alcohol. Alcohol, even more than sugar, may increase blood triglycerides. In addition, alcohol is high  in calories and low in nutrients. Ask for sparkling water, or a diet soft drink instead of an alcoholic beverage. Suggestions for planning and preparing meals   Bake, broil, grill or roast meats instead of frying.  Remove fat from meats and skin from poultry before cooking.  Add spices, herbs, lemon juice or vinegar to vegetables instead of salt, rich sauces or gravies.  Use a non-stick skillet without fat or use no-stick sprays.  Cool and refrigerate stews and broth. Then remove the hardened fat floating on the surface before serving.  Refrigerate meat drippings and skim off fat to make low-fat gravies.  Serve more fish.  Use less butter, margarine and other high-fat spreads on bread or vegetables.  Use skim or reconstituted non-fat dry milk for cooking.  Cook with low-fat cheeses.  Substitute low-fat yogurt or cottage cheese for all or part of the sour cream in recipes for sauces, dips or congealed salads.  Use half yogurt/half mayonnaise in salad recipes.  Substitute evaporated skim milk for cream. Evaporated skim milk or reconstituted non-fat dry milk can be whipped and substituted for whipped cream in certain recipes.  Choose fresh fruits for dessert instead of high-fat foods such as pies or cakes. Fruits are naturally low in fat. When Dining Out   Order low-fat appetizers such as fruit or vegetable juice, pasta with vegetables or tomato sauce.  Select clear, rather than cream soups.  Ask that dressings and gravies be served on the side. Then use less of them.  Order foods that are baked, broiled, poached, steamed, stir-fried, or roasted.  Ask for margarine instead of butter, and use only a small amount.  Drink sparkling water, unsweetened tea or coffee, or diet soft drinks instead of alcohol or other sweet beverages. QUESTIONS AND ANSWERS ABOUT OTHER FATS IN THE BLOOD: SATURATED FAT, TRANS FAT, AND CHOLESTEROL What is trans fat? Trans fat is a type of fat that is  formed when vegetable oil is hardened through a process called hydrogenation. This process helps makes foods more solid, gives them shape, and prolongs their shelf life. Trans fats are also called hydrogenated or partially hydrogenated oils.  What do saturated fat, trans fat, and cholesterol in foods have to do with heart disease? Saturated fat, trans fat, and cholesterol in the diet all raise the level of LDL "bad" cholesterol in the blood. The higher the LDL cholesterol, the greater the risk for coronary heart disease (CHD). Saturated fat and trans fat raise LDL similarly.  What foods contain saturated fat, trans fat, and cholesterol? High amounts of saturated fat are found in animal products, such as fatty cuts of meat, chicken skin, and full-fat dairy products like butter, whole milk, cream, and cheese, and in tropical vegetable oils such as palm, palm kernel, and coconut oil. Trans fat is found in some of the same foods as saturated fat, such as vegetable shortening, some margarines (especially hard or stick margarine), crackers, cookies, baked goods, fried foods, salad dressings, and other processed foods made with partially hydrogenated vegetable oils. Small amounts of trans fat also occur naturally in some animal products, such as milk products, beef, and lamb. Foods high in cholesterol include liver, other organ meats, egg yolks, shrimp, and full-fat dairy products. How can I use the new food label to make heart-healthy food choices? Check the Nutrition Facts panel of the food label. Choose foods lower in saturated fat, trans fat, and cholesterol. For saturated fat and cholesterol, you can also use the Percent Daily Value (%DV): 5% DV or less is low, and 20% DV or more is high. (There is no %DV for trans fat.) Use the Nutrition Facts panel to choose foods low in saturated fat and cholesterol, and if the trans fat is not listed, read the ingredients and limit products that list shortening or  hydrogenated or partially hydrogenated vegetable oil, which tend to be high in trans fat. POINTS TO REMEMBER:   Discuss your risk for heart disease with your caregivers, and take steps to reduce risk factors.  Change your diet. Choose foods that are low in saturated fat, trans fat, and cholesterol.  Add exercise to your daily routine if it is not already being done. Participate in physical activity of moderate intensity, like brisk walking, for at least 30 minutes on most, and preferably all days of the week. No time? Break the 30 minutes into three, 10-minute segments during the day.  Stop smoking. If you  do smoke, contact your caregiver to discuss ways in which they can help you quit.  Do not use street drugs.  Maintain a normal weight.  Maintain a healthy blood pressure.  Keep up with your blood work for checking the fats in your blood as directed by your caregiver. Document Released: 03/15/2004 Document Revised: 11/27/2011 Document Reviewed: 10/11/2008 [facility] Patient Information 2014 [place], [place].   I need to see you in 6 months for recheck of A1c.   Diabetes Meal Planning Guide The diabetes meal planning guide is a tool to help you plan your meals and snacks. It is important for people with diabetes to manage their blood glucose (sugar) levels. Choosing the right foods and the right amounts throughout your day will help control your blood glucose. Eating right can even help you improve your blood pressure and reach or maintain a healthy weight. CARBOHYDRATE COUNTING MADE EASY When you eat carbohydrates, they turn to sugar. This raises your blood glucose level. Counting carbohydrates can help you control this level so you feel better. When you plan your meals by counting carbohydrates, you can have more flexibility in what you eat and balance your medicine with your food intake. Carbohydrate counting simply means adding up the total amount of carbohydrate grams in your meals and  snacks. Try to eat about the same amount at each meal. Foods with carbohydrates are listed below. Each portion below is 1 carbohydrate serving or 15 grams of carbohydrates. Ask your dietician how many grams of carbohydrates you should eat at each meal or snack. Grains and Starches  1 slice bread.   English muffin or hotdog/hamburger bun.   cup cold cereal (unsweetened).   cup cooked pasta or rice.   cup starchy vegetables (corn, potatoes, peas, beans, winter squash).  1 tortilla (6 inches).   bagel.  1 waffle or pancake (size of a CD).   cup cooked cereal.  4 to 6 small crackers. *Whole grain is recommended. Fruit  1 cup fresh unsweetened berries, melon, papaya, pineapple.  1 small fresh fruit.   banana or mango.   cup fruit juice (4 oz unsweetened).   cup canned fruit in natural juice or water.  2 tbs dried fruit.  12 to 15 grapes or cherries. Milk and Yogurt  1 cup fat-free or 1% milk.  1 cup soy milk.  6 oz light yogurt with sugar-free sweetener.  6 oz low-fat soy yogurt.  6 oz plain yogurt. Vegetables  1 cup raw or  cup cooked is counted as 0 carbohydrates or a "free" food.  If you eat 3 or more servings at 1 meal, count them as 1 carbohydrate serving. Other Carbohydrates   oz chips or pretzels.   cup ice cream or frozen yogurt.   cup sherbet or sorbet.  2 inch square cake, no frosting.  1 tbs honey, sugar, jam, jelly, or syrup.  2 small cookies.  3 squares of graham crackers.  3 cups popcorn.  6 crackers.  1 cup broth-based soup.  Count 1 cup casserole or other mixed foods as 2 carbohydrate servings.  Foods with less than 20 calories in a serving may be counted as 0 carbohydrates or a "free" food. You may want to purchase a book or computer software that lists the carbohydrate gram counts of different foods. In addition, the nutrition facts panel on the labels of the foods you eat are a good source of this information.  The label will tell you how big the serving size  is and the total number of carbohydrate grams you will be eating per serving. Divide this number by 15 to obtain the number of carbohydrate servings in a portion. Remember, 1 carbohydrate serving equals 15 grams of carbohydrate. SERVING SIZES Measuring foods and serving sizes helps you make sure you are getting the right amount of food. The list below tells how big or small some common serving sizes are.  1 oz.........4 stacked dice.  3 oz........Marland KitchenDeck of cards.  1 tsp.......Marland KitchenTip of little finger.  1 tbs......Marland KitchenMarland KitchenThumb.  2 tbs.......Marland KitchenGolf ball.   cup......Marland KitchenHalf of a fist.  1 cup.......Marland KitchenA fist. SAMPLE DIABETES MEAL PLAN Below is a sample meal plan that includes foods from the grain and starches, dairy, vegetable, fruit, and meat groups. A dietician can individualize a meal plan to fit your calorie needs and tell you the number of servings needed from each food group. However, controlling the total amount of carbohydrates in your meal or snack is more important than making sure you include all of the food groups at every meal. You may interchange carbohydrate containing foods (dairy, starches, and fruits). The meal plan below is an example of a 2000 calorie diet using carbohydrate counting. This meal plan has 17 carbohydrate servings. Breakfast  1 cup oatmeal (2 carb servings).   cup light yogurt (1 carb serving).  1 cup blueberries (1 carb serving).   cup almonds. Snack  1 large apple (2 carb servings).  1 low-fat string cheese stick. Lunch  Chicken breast salad.  1 cup spinach.   cup chopped tomatoes.  2 oz chicken breast, sliced.  2 tbs low-fat [place] dressing.  12 whole-wheat crackers (2 carb servings).  12 to 15 grapes (1 carb serving).  1 cup low-fat milk (1 carb serving). Snack  1 cup carrots.   cup hummus (1 carb serving). Dinner  3 oz broiled salmon.  1 cup brown rice (3 carb servings). Snack  1   cups steamed broccoli (1 carb serving) drizzled with 1 tsp olive oil and lemon juice.  1 cup light pudding (2 carb servings). DIABETES MEAL PLANNING WORKSHEET Your dietician can use this worksheet to help you decide how many servings of foods and what types of foods are right for you.  BREAKFAST Food Group and Servings / Carb Servings Grain/Starches __________________________________ Dairy __________________________________________ Vegetable ______________________________________ Fruit ___________________________________________ Meat __________________________________________ Fat ____________________________________________ LUNCH Food Group and Servings / Carb Servings Grain/Starches ___________________________________ Dairy ___________________________________________ Fruit ____________________________________________ Meat ___________________________________________ Fat _____________________________________________ Wonda Cheng Food Group and Servings / Carb Servings Grain/Starches ___________________________________ Dairy ___________________________________________ Fruit ____________________________________________ Meat ___________________________________________ Fat _____________________________________________ SNACKS Food Group and Servings / Carb Servings Grain/Starches ___________________________________ Dairy ___________________________________________ Vegetable _______________________________________ Fruit ____________________________________________ Meat ___________________________________________ Fat _____________________________________________ DAILY TOTALS Starches _________________________ Vegetable ________________________ Fruit ____________________________ Dairy ____________________________ Meat ____________________________ Fat ______________________________ Document Released: 02/22/2005 Document Revised: 08/20/2011 Document Reviewed: 01/03/2009 [employer] Patient  Information 2014 [employer].

## 2013-09-11 ENCOUNTER — Encounter: Payer: Self-pay | Admitting: Family Medicine

## 2013-09-11 ENCOUNTER — Telehealth: Payer: Self-pay | Admitting: *Deleted

## 2013-09-11 DIAGNOSIS — K5730 Diverticulosis of large intestine without perforation or abscess without bleeding: Secondary | ICD-10-CM | POA: Insufficient documentation

## 2013-09-11 LAB — THYROID PANEL WITH TSH
FREE THYROXINE INDEX: 3.7 (ref 1.0–3.9)
T3 UPTAKE: 35 % (ref 22.5–37.0)
T4 TOTAL: 10.6 ug/dL (ref 5.0–12.5)
TSH: 1.644 u[IU]/mL (ref 0.350–4.500)

## 2013-09-11 NOTE — Telephone Encounter (Signed)
Faxed completed preventive care form with lab results to [phone] Laverta [place]) as requested by patient and mailed original to patient. Confirmation page received at 8:36 a.m.

## 2013-09-12 ENCOUNTER — Encounter: Payer: Self-pay | Admitting: Family Medicine

## 2013-09-17 ENCOUNTER — Encounter: Payer: Self-pay | Admitting: Physician Assistant

## 2013-09-17 ENCOUNTER — Ambulatory Visit (INDEPENDENT_AMBULATORY_CARE_PROVIDER_SITE_OTHER): Payer: BC Managed Care – PPO | Admitting: Physician Assistant

## 2013-09-17 VITALS — BP 134/88 | HR 68 | Temp 98.1°F | Resp 16 | Ht 66.75 in | Wt 215.0 lb

## 2013-09-17 DIAGNOSIS — R52 Pain, unspecified: Secondary | ICD-10-CM

## 2013-09-17 DIAGNOSIS — K5730 Diverticulosis of large intestine without perforation or abscess without bleeding: Secondary | ICD-10-CM

## 2013-09-17 NOTE — Patient Instructions (Signed)
Wash the area daily with soap and water.  You do not need to reapply the dressings, but you may, if needed. You may apply a small amount of antibiotic ointment to the area until healed. If you develop pain, redness, swelling, drainage or fever, please return for evaluation.

## 2013-09-17 NOTE — Progress Notes (Signed)
   Subjective:    Patient ID: Angelica Sharp, female    DOB: [birth year], 48 y.o.   MRN: [id number]   PCP: Ellsworth Lennox, MD  Chief Complaint  Patient presents with  . skin tag removal    right side of neck  . Abdominal Pain    sharp lower abdominal pain    Medications, allergies, past medical history, surgical history, family history, social history and problem list reviewed and updated.  HPI  Presents for removal of a skin tag on the RIGHT neck.  She has multiple tags there, one is particularly large.  They all get caught on necklaces and are rubbed by the collar of her shirts, causing pain.  In addition, this morning she developed sharp lower abdominal pain, consistent with diverticulitis she's had previously.  She describes the pain as mild.  "This is how it usually starts." No fever, chills, nausea, vomiting, diarrhea.  Usually, she self treats with increased fluids, bland diet and stool softeners.  Only required hospitalization x 1, her first episode.  I advised that we address the abdominal pain and reschedule the skin tag removal, but since she notes that she would not have sought care if she wasn't already here for the skin tag removal, she prefers to have the skin tag removed today and return if her self-treatment doesn't resolve the abdominal pain.  Review of Systems As above.    Objective:   Physical Exam  Vitals reviewed. Constitutional: She is oriented to person, place, and time. She appears well-developed and well-nourished. No distress.  BP 134/88  Pulse 68  Temp(Src) 98.1 F (36.7 C) (Oral)  Resp 16  Ht 5' 6.75" (1.695 m)  Wt 215 lb (97.523 kg)  BMI 33.94 kg/m2  SpO2 99%   Eyes: Conjunctivae are normal.  Cardiovascular: Normal rate.   Pulmonary/Chest: Effort normal.  Neurological: She is alert and oriented to person, place, and time.  Skin: Skin is warm and dry.     Cluster of small skin tags on the RIGHT neck, one is 3-4 mm.  VCO. Local  anesthesia with 2% lidocaine with Epi. Cleansed large tag with alcohol prep pad. Tag removed with 15 blade.  Hemostasis achieved with pressure.  Bandage with mupirocin applied.  Patient tolerated well, and 5 smaller tags surrounding the larger one were removed in the same fashion.  Psychiatric: She has a normal mood and affect. Her speech is normal and behavior is normal.          Assessment & Plan:

## 2013-10-29 ENCOUNTER — Encounter: Payer: Self-pay | Admitting: Family Medicine

## 2013-11-15 ENCOUNTER — Ambulatory Visit (INDEPENDENT_AMBULATORY_CARE_PROVIDER_SITE_OTHER): Payer: BC Managed Care – PPO | Admitting: Physician Assistant

## 2013-11-15 VITALS — BP 130/80 | HR 94 | Temp 98.0°F | Resp 20 | Ht 67.0 in | Wt 212.6 lb

## 2013-11-15 DIAGNOSIS — B3749 Other urogenital candidiasis: Secondary | ICD-10-CM

## 2013-11-15 DIAGNOSIS — K5792 Diverticulitis of intestine, part unspecified, without perforation or abscess without bleeding: Secondary | ICD-10-CM

## 2013-11-15 DIAGNOSIS — B3741 Candidal cystitis and urethritis: Secondary | ICD-10-CM

## 2013-11-15 DIAGNOSIS — K5732 Diverticulitis of large intestine without perforation or abscess without bleeding: Secondary | ICD-10-CM

## 2013-11-15 DIAGNOSIS — R1032 Left lower quadrant pain: Secondary | ICD-10-CM

## 2013-11-15 LAB — POCT URINALYSIS DIPSTICK
Bilirubin, UA: NEGATIVE
Glucose, UA: NEGATIVE
Ketones, UA: NEGATIVE
LEUKOCYTES UA: NEGATIVE
NITRITE UA: NEGATIVE
PH UA: 6
PROTEIN UA: NEGATIVE
RBC UA: NEGATIVE
Spec Grav, UA: 1.015
Urobilinogen, UA: 0.2

## 2013-11-15 LAB — POCT CBC
Granulocyte percent: 78.9 %G (ref 37–80)
HEMATOCRIT: 39 % (ref 37.7–47.9)
Hemoglobin: 12.2 g/dL (ref 12.2–16.2)
LYMPH, POC: 1.8 (ref 0.6–3.4)
MCH, POC: 25.1 pg — AB (ref 27–31.2)
MCHC: 31.3 g/dL — AB (ref 31.8–35.4)
MCV: 80.1 fL (ref 80–97)
MID (cbc): 0.5 (ref 0–0.9)
MPV: 9.2 fL (ref 0–99.8)
POC Granulocyte: 8.9 — AB (ref 2–6.9)
POC LYMPH PERCENT: 16.3 %L (ref 10–50)
POC MID %: 4.8 %M (ref 0–12)
Platelet Count, POC: 284 10*3/uL (ref 142–424)
RBC: 4.87 M/uL (ref 4.04–5.48)
RDW, POC: 15.5 %
WBC: 11.3 10*3/uL — AB (ref 4.6–10.2)

## 2013-11-15 LAB — POCT UA - MICROSCOPIC ONLY
CRYSTALS, UR, HPF, POC: NEGATIVE
Casts, Ur, LPF, POC: NEGATIVE
Mucus, UA: POSITIVE
RBC, urine, microscopic: NEGATIVE
YEAST UA: POSITIVE

## 2013-11-15 MED ORDER — FLUCONAZOLE 150 MG PO TABS: 150.0000 mg | ORAL_TABLET | Freq: Once | ORAL | Status: DC

## 2013-11-15 MED ORDER — METRONIDAZOLE 500 MG PO TABS: 500.0000 mg | ORAL_TABLET | Freq: Three times a day (TID) | ORAL | Status: DC

## 2013-11-15 MED ORDER — CIPROFLOXACIN HCL 500 MG PO TABS: 500.0000 mg | ORAL_TABLET | Freq: Two times a day (BID) | ORAL | Status: DC

## 2013-11-15 MED ORDER — ONDANSETRON 4 MG PO TBDP: 4.0000 mg | ORAL_TABLET | Freq: Three times a day (TID) | ORAL | Status: DC | PRN

## 2013-11-15 NOTE — Patient Instructions (Signed)
Diverticulitis °A diverticulum is a small pouch or sac on the colon. Diverticulosis is the presence of these diverticula on the colon. Diverticulitis is the irritation (inflammation) or infection of diverticula. °CAUSES  °The colon and its diverticula contain bacteria. If food particles block the tiny opening to a diverticulum, the bacteria inside can grow and cause an increase in pressure. This leads to infection and inflammation and is called diverticulitis. °SYMPTOMS  °· Abdominal pain and tenderness. Usually, the pain is located on the left side of your abdomen. However, it could be located elsewhere. °· Fever. °· Bloating. °· Feeling sick to your stomach (nausea). °· Throwing up (vomiting). °· Abnormal stools. °DIAGNOSIS  °Your caregiver will take a history and perform a physical exam. Since many things can cause abdominal pain, other tests may be necessary. Tests may include: °· Blood tests. °· Urine tests. °· X-ray of the abdomen. °· CT scan of the abdomen. °Sometimes, surgery is needed to determine if diverticulitis or other conditions are causing your symptoms. °TREATMENT  °Most of the time, you can be treated without surgery. Treatment includes: °· Resting the bowels by only having liquids for a few days. As you improve, you will need to eat a low-fiber diet. °· Intravenous (IV) fluids if you are losing body fluids (dehydrated). °· Antibiotic medicines that treat infections may be given. °· Pain and nausea medicine, if needed. °· Surgery if the inflamed diverticulum has burst. °HOME CARE INSTRUCTIONS  °· Try a clear liquid diet (broth, tea, or water for as long as directed by your caregiver). You may then gradually begin a low-fiber diet as tolerated.  °A low-fiber diet is a diet with less than 10 grams of fiber. Choose the foods below to reduce fiber in the diet: °· White breads, cereals, rice, and pasta. °· Cooked fruits and vegetables or soft fresh fruits and vegetables without the skin. °· Ground or  well-cooked tender beef, ham, veal, lamb, pork, or poultry. °· Eggs and seafood. °· After your diverticulitis symptoms have improved, your caregiver may put you on a high-fiber diet. A high-fiber diet includes 14 grams of fiber for every 1000 calories consumed. For a standard 2000 calorie diet, you would need 28 grams of fiber. Follow these diet guidelines to help you increase the fiber in your diet. It is important to slowly increase the amount fiber in your diet to avoid gas, constipation, and bloating. °· Choose whole-grain breads, cereals, pasta, and brown rice. °· Choose fresh fruits and vegetables with the skin on. Do not overcook vegetables because the more vegetables are cooked, the more fiber is lost. °· Choose more nuts, seeds, legumes, dried peas, beans, and lentils. °· Look for food products that have greater than 3 grams of fiber per serving on the Nutrition Facts label. °· Take all medicine as directed by your caregiver. °· If your caregiver has given you a follow-up appointment, it is very important that you go. Not going could result in lasting (chronic) or permanent injury, pain, and disability. If there is any problem keeping the appointment, call to reschedule. °SEEK MEDICAL CARE IF:  °· Your pain does not improve. °· You have a hard time advancing your diet beyond clear liquids. °· Your bowel movements do not return to normal. °SEEK IMMEDIATE MEDICAL CARE IF:  °· Your pain becomes worse. °· You have an oral temperature above 102° F (38.9° C), not controlled by medicine. °· You have repeated vomiting. °· You have bloody or black, tarry stools. °·   Symptoms that brought you to your caregiver become worse or are not getting better. °MAKE SURE YOU:  °· Understand these instructions. °· Will watch your condition. °· Will get help right away if you are not doing well or get worse. °Document Released: 03/07/2005 Document Revised: 08/20/2011 Document Reviewed: 07/03/2010 °[employer]® Patient Information  ©2014 [employer]. ° °

## 2013-11-15 NOTE — Progress Notes (Signed)
Subjective:    Patient ID: Angelica Sharp, female    DOB: Nov 08, 1965, 48 y.o.   MRN: [id number]  HPI Primary Physician: Ellsworth Lennox, MD  Chief Complaint: Abdominal pain x 2 days  HPI: 48 y.o. female with history of diverticulitis, reflux, hiatal hernia, hypertension, arthritis, LE edema, and lumbar disc disease presents with 2 day history of left lower quadrant and suprapubic abdominal pain. Feels just like previous episodes of diverticulitis. Afebrile. Some chills and myalgias the previous day. No diarrhea. Normal stools. Some nausea. No vomiting. Abdominal distension postprandial. No urinary or vaginal complaints. Status post hysterectomy 2014.   Back in April she did not take the Flagyl the was prescribed. States this was not a diverticulitis flare. Symptoms self resolved. October was 2nd diverticular flare. First diverticular flare she was hospitalized back in 2010 or 2011. Had full work up at that time. Has a GI MD. Does a good job at avoiding triggers.    Past Medical History  Diagnosis Date  . Chest pain   . SOB (shortness of breath)   . Edema     lower extremity  . Borderline hypertension   . Arthritis   . Family history of anesthesia complication     sister had BP complications during anes.  . Hypertension   . Degenerative lumbar disc     L5S1  . GERD (gastroesophageal reflux disease)   . H/O hiatal hernia   . Diverticulitis      Home Meds: Prior to Admission medications   Medication Sig Start Date End Date Taking? Authorizing Provider  ALPRAZolam Duanne Moron) 0.25 MG tablet TAKE 1 TABLET EVERY DAY 09/10/13  Yes Barton Fanny, MD  aspirin 81 MG tablet Take 81 mg by mouth daily.   Yes Historical Provider, MD  calcium-vitamin D (OSCAL WITH D) 500-200 MG-UNIT per tablet Take 1 tablet by mouth.   Yes Historical Provider, MD  Cholecalciferol (VITAMIN D PO) Take 1 capsule by mouth daily.   Yes Historical Provider, MD  docusate sodium (COLACE) 100 MG capsule Take 200  mg by mouth at bedtime. For stool softner   Yes Historical Provider, MD  esomeprazole (NEXIUM) 40 MG capsule Take 1 capsule (40 mg total) by mouth daily. 09/10/13  Yes Barton Fanny, MD  hydrochlorothiazide (HYDRODIURIL) 25 MG tablet Take 1 tablet (25 mg total) by mouth daily. 09/10/13  Yes Barton Fanny, MD  ibuprofen (ADVIL) 600 MG tablet Take 1 tablet (600 mg total) by mouth every 6 (six) hours as needed for pain. 09/19/12  Yes Megan Morris, DO  KRILL OIL PO Take by mouth.   Yes Historical Provider, MD  Multiple Vitamin (MULITIVITAMIN WITH MINERALS) TABS Take 1 tablet by mouth daily.   Yes Historical Provider, MD  naproxen sodium (ANAPROX) 220 MG tablet Take 440 mg by mouth daily as needed (alternates with ibuprofen for pain).   Yes Historical Provider, MD  Potassium Gluconate 550 MG TABS Take 1 tablet by mouth daily.   Yes Historical Provider, MD    Allergies:  Allergies  Allergen Reactions  . Morphine And Related     Headache and hallucinations  . Septra [Sulfamethoxazole-Trimethoprim]     Increased heartrate    History   Social History  . Marital Status: Divorced    Spouse Name: N/A    Number of Children: N/A  . Years of Education: N/A   Occupational History  . Mortgage    Social History Main Topics  . Smoking status: Never Smoker   .  Smokeless tobacco: Not on file  . Alcohol Use: No  . Drug Use: No  . Sexual Activity: Not on file   Other Topics Concern  . Not on file   Social History Narrative  . No narrative on file     Review of Systems  Constitutional: Positive for chills and fatigue. Negative for fever and appetite change.       Chills the previous day.   Gastrointestinal: Positive for nausea, abdominal pain and abdominal distention. Negative for vomiting and diarrhea.       Mild distension after eating.   Genitourinary: Negative for dysuria, urgency, frequency, hematuria, flank pain, vaginal bleeding, vaginal discharge, difficulty urinating, vaginal  pain and pelvic pain.  Musculoskeletal: Positive for myalgias.  Neurological: Positive for dizziness.       Objective:   Physical Exam  Physical Exam: Blood pressure 130/80, pulse 94, temperature 98 F (36.7 C), temperature source Oral, resp. rate 20, height 5\' 7"  (1.702 m), weight 212 lb 9.6 oz (96.435 kg), last menstrual period 08/10/2012, SpO2 98.00%., Body mass index is 33.29 kg/(m^2). General: Well developed, well nourished, in no acute distress. Head: Normocephalic, atraumatic, eyes without discharge, sclera non-icteric, nares are without discharge. Bilateral auditory canals clear, TM's are without perforation, pearly grey and translucent with reflective cone of light bilaterally. Oral cavity moist, posterior pharynx without exudate, erythema, peritonsillar abscess, or post nasal drip. Uvula midline.   Neck: Supple. No thyromegaly. Full ROM. No lymphadenopathy. No nuchal rigidity.  Lungs: Clear bilaterally to auscultation without wheezes, rales, or rhonchi. Breathing is unlabored. Heart: RRR with S1 S2. No murmurs, rubs, or gallops appreciated. Abdomen: Soft, non-distended with normoactive bowel sounds. LLQ and suprapubic TTP. No hepatosplenomegaly. No rebound/guarding. No obvious abdominal masses. Negative McBurney's. Negative table jar. No CVA TTP bilaterally.  Msk:  Strength and tone normal for age. Extremities/Skin: Warm and dry. No clubbing or cyanosis. No edema. No rashes or suspicious lesions. Neuro: Alert and oriented X 3. Moves all extremities spontaneously. Gait is normal. CNII-XII grossly in tact. Psych:  Responds to questions appropriately with a normal affect.    Labs: Results for orders placed in visit on 11/15/13  POCT CBC      Result Value Ref Range   WBC 11.3 (*) 4.6 - 10.2 K/uL   Lymph, poc 1.8  0.6 - 3.4   POC LYMPH PERCENT 16.3  10 - 50 %L   MID (cbc) 0.5  0 - 0.9   POC MID % 4.8  0 - 12 %M   POC Granulocyte 8.9 (*) 2 - 6.9   Granulocyte percent 78.9  37 -  80 %G   RBC 4.87  4.04 - 5.48 M/uL   Hemoglobin 12.2  12.2 - 16.2 g/dL   HCT, POC 39.0  37.7 - 47.9 %   MCV 80.1  80 - 97 fL   MCH, POC 25.1 (*) 27 - 31.2 pg   MCHC 31.3 (*) 31.8 - 35.4 g/dL   RDW, POC 15.5     Platelet Count, POC 284  142 - 424 K/uL   MPV 9.2  0 - 99.8 fL  POCT UA - MICROSCOPIC ONLY      Result Value Ref Range   WBC, Ur, HPF, POC 0-3     RBC, urine, microscopic neg     Bacteria, U Microscopic 1+     Mucus, UA positive     Epithelial cells, urine per micros 2-6     Crystals, Ur, HPF, POC neg  Casts, Ur, LPF, POC neg     Yeast, UA positive    POCT URINALYSIS DIPSTICK      Result Value Ref Range   Color, UA yellow     Clarity, UA clear     Glucose, UA neg     Bilirubin, UA neg     Ketones, UA neg     Spec Grav, UA 1.015     Blood, UA neg     pH, UA 6.0     Protein, UA neg     Urobilinogen, UA 0.2     Nitrite, UA neg     Leukocytes, UA Negative     Urine culture pending     Assessment & Plan:  48 year old female with diverticulitis and yeast cystitis   1) Diverticulitis -Cipro 500 mg bid #14 no RF -Flagyl 500 tid #21 no RF -Push fluids -RTC/ER precautions -Close follow up  2) Yeast cystitis -Diflucan 150 mg 1 po now, repeat in a week #1 RF 1 -Await urine culture    Christell Faith, MHS, PA-C [facility] [place], [place] [id number] [place] 11/15/2013 12:30 PM

## 2013-11-16 LAB — URINE CULTURE
COLONY COUNT: NO GROWTH
Organism ID, Bacteria: NO GROWTH

## 2014-01-28 ENCOUNTER — Ambulatory Visit (INDEPENDENT_AMBULATORY_CARE_PROVIDER_SITE_OTHER): Payer: BC Managed Care – PPO | Admitting: Family Medicine

## 2014-01-28 ENCOUNTER — Ambulatory Visit (INDEPENDENT_AMBULATORY_CARE_PROVIDER_SITE_OTHER): Payer: BC Managed Care – PPO

## 2014-01-28 VITALS — BP 122/80 | HR 78 | Temp 98.8°F | Resp 16 | Ht 67.25 in | Wt 217.2 lb

## 2014-01-28 DIAGNOSIS — R05 Cough: Secondary | ICD-10-CM

## 2014-01-28 DIAGNOSIS — R059 Cough, unspecified: Secondary | ICD-10-CM

## 2014-01-28 DIAGNOSIS — J22 Unspecified acute lower respiratory infection: Secondary | ICD-10-CM

## 2014-01-28 DIAGNOSIS — R5383 Other fatigue: Secondary | ICD-10-CM

## 2014-01-28 DIAGNOSIS — R739 Hyperglycemia, unspecified: Secondary | ICD-10-CM

## 2014-01-28 DIAGNOSIS — J988 Other specified respiratory disorders: Secondary | ICD-10-CM

## 2014-01-28 DIAGNOSIS — M25569 Pain in unspecified knee: Secondary | ICD-10-CM

## 2014-01-28 DIAGNOSIS — R7309 Other abnormal glucose: Secondary | ICD-10-CM

## 2014-01-28 DIAGNOSIS — R5381 Other malaise: Secondary | ICD-10-CM

## 2014-01-28 DIAGNOSIS — M25562 Pain in left knee: Secondary | ICD-10-CM

## 2014-01-28 LAB — POCT CBC
Granulocyte percent: 71.7 %G (ref 37–80)
HEMATOCRIT: 43.2 % (ref 37.7–47.9)
Hemoglobin: 13 g/dL (ref 12.2–16.2)
Lymph, poc: 2.1 (ref 0.6–3.4)
MCH, POC: 24.3 pg — AB (ref 27–31.2)
MCHC: 30.1 g/dL — AB (ref 31.8–35.4)
MCV: 80.6 fL (ref 80–97)
MID (cbc): 0.4 (ref 0–0.9)
MPV: 7.5 fL (ref 0–99.8)
POC GRANULOCYTE: 6.4 (ref 2–6.9)
POC LYMPH PERCENT: 23.6 %L (ref 10–50)
POC MID %: 4.7 % (ref 0–12)
Platelet Count, POC: 350 10*3/uL (ref 142–424)
RBC: 5.36 M/uL (ref 4.04–5.48)
RDW, POC: 16.2 %
WBC: 8.9 10*3/uL (ref 4.6–10.2)

## 2014-01-28 LAB — GLUCOSE, POCT (MANUAL RESULT ENTRY): POC GLUCOSE: 77 mg/dL (ref 70–99)

## 2014-01-28 MED ORDER — AZITHROMYCIN 250 MG PO TABS: ORAL_TABLET | ORAL | Status: DC

## 2014-01-28 NOTE — Progress Notes (Addendum)
Subjective:  This chart was scribed for Angelica Ray, MD by Roxan Diesel, Scribe.  This patient was seen in [facility] and the patient's care was started at 2:48 PM.   Patient ID: Angelica Sharp, female    DOB: 08/19/1965, 48 y.o.   MRN: [id number]   HPI  Angelica Sharp is a 48 y.o. female PCP: Ellsworth Lennox, MD   Pt presents for multiple concerns.  1. Cough.  Initially began 8 weeks ago.  Pt was seen at Urgent Care in [place] about 8 weeks ago for a cough and diagnosed with bronchitis.  She did not have bloodwork or CXR and was diagnosed based on her lung sounds on exam.  She was prescribed on albuterol inhaler which she took every 4 hours for the first few days, then every 6 hours for the remainder of her treatment, and went off 2 weeks ago.  She was also placed on hydrocodone.  She was not prescribed steroids or antibiotics.  She states her cough improved 2 weeks ago so she stopped taking her inhaler, which she has not needed to use since then.  However several days ago her cough began worsening again and she began coughing up green sputum, which she was not doing when she was initially diagnosed with bronchitis.  She states her cough comes in "spurts."  She denies fevers, chills, night sweats, unexplained weight loss, or sinus pressure.  Pt states she has had bronchitis and pneumonia several times but has never diagnosed with any chronic respiratory problems.  She denies recent sick contact.  2. Left knee pain. She broke her right foot and was in a boot for several weeks.  She is now wearing it 2 hours on and 2 hours off.  5 days ago she developed pain to the back of the left knee that is worsened by bending.  She denies any specific injury.  Yesterday she also noticed some swelling.  The knee has also "caught" several times when standing from a seated position or while walking.  She is able to walk and bear weight.  She denies h/o surgery or previous problems to that  knee.  3. Fatigue. Began several days ago.  She feels "like maybe my blood pressure or sugar was out of wack."  She is scheduled for a recheck on her A1C because it was elevated at 6.5 in April.  She denies polyuria, polydipsia, or abdominal pain.   Patient Active Problem List   Diagnosis Date Noted  . 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 Other and unspecified hyperlipidemia 09/10/2013  . Impaired glucose metabolism 09/10/2013  . Inconclusive mammogram 08/31/2013  . Family history of cancer 08/30/2012  . GERD (gastroesophageal reflux disease) 07/03/2011  . COUGH 04/18/2010  . EDEMA 01/24/2010  . CHEST PAIN 01/24/2010    Past Medical History  Diagnosis Date  . Chest pain   . SOB (shortness of breath)   . Edema     lower extremity  . Borderline hypertension   . Arthritis   . Family history of anesthesia complication     sister had BP complications during anes.  . Hypertension   . Degenerative lumbar disc     L5S1  . GERD (gastroesophageal reflux disease)   . H/O hiatal hernia   . Diverticulitis     Past Surgical History  Procedure Laterality Date  . Tubal ligation    . Breast lumpectomy      3 lumps -- two from right --  one from left  . Laparoscopic assisted vaginal hysterectomy N/A 09/18/2012    Procedure: LAPAROSCOPIC ASSISTED VAGINAL HYSTERECTOMY;  Surgeon: Linda Hedges, DO;  Location: [facility];  Service: Gynecology;  Laterality: N/A;  BLADDER  DISTENTION    Allergies  Allergen Reactions  . Morphine And Related     Headache and hallucinations  . Septra [Sulfamethoxazole-Trimethoprim]     Increased heartrate    Prior to Admission medications   Medication Sig Start Date End Date Taking? Authorizing Provider  ALPRAZolam Duanne Moron) 0.25 MG tablet TAKE 1 TABLET EVERY DAY 09/10/13  Yes Barton Fanny, MD  aspirin 81 MG tablet Take 81 mg by mouth daily.   Yes Historical Provider, MD  calcium-vitamin D (OSCAL WITH D) 500-200 MG-UNIT per tablet Take 1 tablet by  mouth.   Yes Historical Provider, MD  Cholecalciferol (VITAMIN D PO) Take 1 capsule by mouth daily.   Yes Historical Provider, MD  docusate sodium (COLACE) 100 MG capsule Take 200 mg by mouth at bedtime. For stool softner   Yes Historical Provider, MD  esomeprazole (NEXIUM) 40 MG capsule Take 1 capsule (40 mg total) by mouth daily. 09/10/13  Yes Barton Fanny, MD  fluconazole (DIFLUCAN) 150 MG tablet Take 1 tablet (150 mg total) by mouth once. 11/15/13  Yes Ryan M Dunn, PA-C  hydrochlorothiazide (HYDRODIURIL) 25 MG tablet Take 1 tablet (25 mg total) by mouth daily. 09/10/13  Yes Barton Fanny, MD  ibuprofen (ADVIL) 600 MG tablet Take 1 tablet (600 mg total) by mouth every 6 (six) hours as needed for pain. 09/19/12  Yes Megan Morris, DO  KRILL OIL PO Take by mouth.   Yes Historical Provider, MD  Multiple Vitamin (MULITIVITAMIN WITH MINERALS) TABS Take 1 tablet by mouth daily.   Yes Historical Provider, MD  naproxen sodium (ANAPROX) 220 MG tablet Take 440 mg by mouth daily as needed (alternates with ibuprofen for pain).   Yes Historical Provider, MD  Potassium Gluconate 550 MG TABS Take 1 tablet by mouth daily.   Yes Historical Provider, MD  ciprofloxacin (CIPRO) 500 MG tablet Take 1 tablet (500 mg total) by mouth 2 (two) times daily. 11/15/13   Areta Haber Dunn, PA-C  metroNIDAZOLE (FLAGYL) 500 MG tablet Take 1 tablet (500 mg total) by mouth 3 (three) times daily. 11/15/13   Areta Haber Dunn, PA-C  ondansetron (ZOFRAN-ODT) 4 MG disintegrating tablet Take 1 tablet (4 mg total) by mouth every 8 (eight) hours as needed for nausea. 11/15/13   Rise Mu, PA-C    History   Social History  . Marital Status: Divorced    Spouse Name: N/A    Number of Children: N/A  . Years of Education: N/A   Occupational History  . Mortgage    Social History Main Topics  . Smoking status: Never Smoker   . Smokeless tobacco: Not on file  . Alcohol Use: No  . Drug Use: No  . Sexual Activity: Not on file   Other Topics  Concern  . Not on file   Social History Narrative  . No narrative on file     Review of Systems  Constitutional: Positive for fatigue. Negative for fever, chills, diaphoresis and unexpected weight change.  HENT: Negative for sinus pressure.   Respiratory: Positive for cough.   Gastrointestinal: Negative for abdominal pain.  Endocrine: Negative for polydipsia and polyuria.  Musculoskeletal: Positive for arthralgias (left knee).       Objective:   Physical Exam  Nursing note and  vitals reviewed. Constitutional: She is oriented to person, place, and time. She appears well-developed and well-nourished. No distress.  HENT:  Head: Normocephalic and atraumatic.  Eyes: Conjunctivae and EOM are normal.  Neck: Neck supple. No tracheal deviation present.  Cardiovascular: Normal rate.   Pulmonary/Chest: Effort normal and breath sounds normal. No respiratory distress. She has no wheezes. She has no rhonchi. She has no rales.  Musculoskeletal: Normal range of motion. She exhibits edema (trace pedal edema bilaterally (chronic per pt)).  No effusion in left knee joint.  Tender along popliteal fossa and distal hamstring laterally. Negative varus, valgus and McMurray test.  Negative Ober and Lachman.  Neurological: She is alert and oriented to person, place, and time.  Skin: Skin is warm and dry.  Psychiatric: She has a normal mood and affect. Her behavior is normal.     BP 122/80  Pulse 78  Temp(Src) 98.8 F (37.1 C) (Oral)  Resp 16  Ht 5' 7.25" (1.708 m)  Wt 217 lb 3.2 oz (98.521 kg)  BMI 33.77 kg/m2  SpO2 98%  LMP 08/10/2012  Results for orders placed in visit on 01/28/14  POCT CBC      Result Value Ref Range   WBC 8.9  4.6 - 10.2 K/uL   Lymph, poc 2.1  0.6 - 3.4   POC LYMPH PERCENT 23.6  10 - 50 %L   MID (cbc) 0.4  0 - 0.9   POC MID % 4.7  0 - 12 %M   POC Granulocyte 6.4  2 - 6.9   Granulocyte percent 71.7  37 - 80 %G   RBC 5.36  4.04 - 5.48 M/uL   Hemoglobin 13.0  12.2 -  16.2 g/dL   HCT, POC 43.2  37.7 - 47.9 %   MCV 80.6  80 - 97 fL   MCH, POC 24.3 (*) 27 - 31.2 pg   MCHC 30.1 (*) 31.8 - 35.4 g/dL   RDW, POC 16.2     Platelet Count, POC 350  142 - 424 K/uL   MPV 7.5  0 - 99.8 fL  GLUCOSE, POCT (MANUAL RESULT ENTRY)      Result Value Ref Range   POC Glucose 77  70 - 99 mg/dl   [facility] reading (PRIMARY) by  Dr. Carlota Raspberry: CXR: incomplete inspiration, but vertical line paralleling chest wall on right - ?pneumothorax. No mediastinal widening or shift.   Linear marking not seen on prior CXR.    -overread reviewed, d/w radiologist - artifact likely, not PTX, no acute findings.      Assessment & Plan:   BETTINA WARN is a 48 y.o. female Cough - Plan: DG Chest 2 View, azithromycin (ZITHROMAX) 250 MG tablet LRTI (lower respiratory tract infection) - Plan: azithromycin (ZITHROMAX) 250 MG tablet  - prior 6-8 weeks of cough, with secondary worsening. Now with productive, discolored phlegm - early LRTI. Start Zpak, mucinex if needed, but stop if wheeze. Has albuterol if needed and rtc precautions.   Left knee pain  -suspected hamstring source vs. Baker's cyst.   Hamstring stretches by h/o below, rtc if not improving in 2 weeks.   Other malaise and fatigue - Plan: POCT CBC  - as above. recheck in 2 weeks if not improved with above.   Hyperglycemia - Plan: POCT glucose (manual entry)  - by hx, ok today. Keep follow up in few months with PCP.   rtc precautions.  Meds ordered this encounter  Medications  . azithromycin (ZITHROMAX) 250 MG  tablet    Sig: Take 2 pills by mouth on day 1, then 1 pill by mouth per day on days 2 through 5.    Dispense:  6 tablet    Refill:  0   Patient Instructions  Your xray was ok, but this may be early bronchitis or worsening from prior cough. Can start Zpak, mucinex over the counter (stop mucinex if wheezing or increased wheezing), and albuterol if needed for wheeze. Return to the clinic or go to the nearest emergency room if any  of your symptoms worsen or new symptoms occur.   I suspect your knee pain is in part from your hamstrings-  Overuse or strain. Try some of the stretches below, range of motion and if needed ice to area on and off for 15 minutes. If this is not improving in next 2 weeks (or worse sooner) - return for recheck.   Hamstring Strain with Rehab The hamstring muscle and tendons are vulnerable to muscle or tendon tear (strain). Hamstring tears cause pain and inflammation in the backside of the thigh, where the hamstring muscles are located. The hamstring is comprised of three muscles that are responsible for straightening the hip, bending the knee, and stabilizing the knee. These muscles are important for walking, running, and jumping. Hamstring strain is the most common injury of the thigh. Hamstring strains are classified as grade 1 or 2 strains. Grade 1 strains cause pain, but the tendon is not lengthened. Grade 2 strains include a lengthened ligament due to the ligament being stretched or partially ruptured. With grade 2 strains there is still function, although the function may be decreased.  SYMPTOMS   Pain, tenderness, swelling, warmth, or redness over the hamstring muscles, at the back of the thigh.  Pain that gets worse during and after intense activity.  A "pop" heard in the area, at the time of injury.  Muscle spasm in the hamstring muscles.  Pain or weakness with running, jumping, or bending the knee against resistance.  Crackling sound (crepitation) when the tendon is moved or touched.  Bruising (contusion) in the thigh within 48 hours of injury.  Loss of fullness of the muscle, or area of muscle bulging in the case of a complete rupture. CAUSES  A muscle strain occurs when a force is placed on the muscle or tendon that is greater than it can withstand. Common causes of injury include:  Strain from overuse or sudden increase in the frequency, duration, or intensity of  activity.  Single violent blow or force to the back of the knee or the hamstring area of the thigh. RISK INCREASES WITH:  Sports that require quick starts (sprinting, racquetball, tennis).  Sports that require jumping (basketball, volleyball).  Kicking sports and water skiing.  Contact sports (soccer, football).  Poor strength and flexibility.  Failure to warm up properly before activity.  Previous thigh, knee, or pelvis injury.  Poor exercise technique.  Poor posture. PREVENTION  Maintain physical fitness:  Strength, flexibility, and endurance.  Cardiovascular fitness.  Learn and use proper exercise technique and posture.  Wear proper fitted and padded protective equipment. PROGNOSIS  If treated properly, hamstring strains are usually curable in 2 to 6 weeks. RELATED COMPLICATIONS   Longer healing time, if not properly treated or if not given adequate time to heal.  Chronically inflamed tendon, causing persistent pain with activity that may progress to constant pain.  Recurring symptoms, if activity is resumed too soon.  Vulnerable to repeated injury (in up  to 25% of cases). TREATMENT  Treatment first involves the use of ice and medication to help reduce pain and inflammation. It is also important to complete strengthening and stretching exercises, as well as modifying any activities that aggravate the symptoms. These exercises may be completed at home or with a therapist. Your caregiver may recommend the use of crutches to help reduce pain and discomfort, especially is the strain is severe enough to cause limping. If the tendon has pulled away from the bone, then surgery is necessary to reattach it. MEDICATION   If pain medicine is needed, nonsteroidal anti-inflammatory medicines (aspirin and ibuprofen), or other minor pain relievers (acetaminophen), are often advised.  Do not take pain medicine for 7 days before surgery.  Prescription pain relievers may be given  if your caregiver thinks they are needed. Use only as directed and only as much as you need.  Corticosteroid injections may be recommended. However, these injections should only be used for serious cases, as they can only be given a certain number of times.  Ointments applied to the skin may be beneficial. HEAT AND COLD  Cold treatment (icing) relieves pain and reduces inflammation. Cold treatment should be applied for 10 to 15 minutes every 2 to 3 hours, and immediately after activity that aggravates your symptoms. Use ice packs or an ice massage.  Heat treatment may be used before performing the stretching and strengthening activities prescribed by your caregiver, physical therapist, or athletic trainer. Use a heat pack or a warm water soak. SEEK MEDICAL CARE IF:   Symptoms get worse or do not improve in 2 weeks, despite treatment.  New, unexplained symptoms develop. (Drugs used in treatment may produce side effects.) EXERCISES RANGE OF MOTION (ROM) AND STRETCHING EXERCISES - Hamstring Strain These exercises may help you when beginning to rehabilitate your injury. Your symptoms may go away with or without further involvement from your physician, physical therapist or athletic trainer. While completing these exercises, remember:   Restoring tissue flexibility helps normal motion to return to the joints. This allows healthier, less painful movement and activity.  An effective stretch should be held for at least 30 seconds.  A stretch should never be painful. You should only feel a gentle lengthening or release in the stretched tissue. STRETCH - Hamstrings, Standing  Stand or sit, and extend your right / left leg, placing your foot on a chair or foot stool.  Keep a slight arch in your low back and your hips straight forward.  Lead with your chest, and lean forward at the waist until you feel a gentle stretch in the back of your right / left knee or thigh. (When done correctly, this  exercise requires leaning only a small distance.)  Hold this position for __________ seconds. Repeat __________ times. Complete this stretch __________ times per day. STRETCH - Hamstrings, Supine   Lie on your back. Loop a belt or towel over the ball of your right / left foot.  Straighten your right / left knee and slowly pull on the belt to raise your leg. Do not allow the right / left knee to bend. Keep your opposite leg flat on the floor.  Raise the leg until you feel a gentle stretch behind your right / left knee or thigh. Hold this position for __________ seconds. Repeat __________ times. Complete this stretch __________ times per day.  STRETCH - Hamstrings, Doorway  Lie on your back with your right / left leg extended and resting on the wall,  and the opposite leg flat on the ground through the door. Initially, position your bottom farther away from the wall.  Keep your right / left knee straight. If you feel a stretch behind your knee or thigh, hold this position for __________ seconds.  If you do not feel a stretch, scoot your bottom closer to the door and hold __________ seconds. Repeat __________ times. Complete this stretch __________ times per day.  STRETCH - Hamstrings/Adductors, V-Sit   Sit on the floor with your legs extended in a large "V," keeping your knees straight.  With your head and chest upright, bend at your waist reaching for your left foot to stretch your right thigh muscles.  You should feel a stretch in your right inner thigh. Hold for __________ seconds.  Return to the upright position to relax your leg muscles.  Continuing to keep your chest upright, bend straight forward at your waist to stretch your hamstrings.  You should feel a stretch behind both of your thighs and knees. Hold for __________ seconds.  Return to the upright position to relax your leg muscles.  With your head and chest upright, bend at your waist reaching for your right foot to  stretch your left thigh muscles.  You should feel a stretch in your left inner thigh. Hold for __________ seconds.  Return to the upright position to relax your leg muscles. Repeat __________ times. Complete this exercise __________ times per day.  STRENGTHENING EXERCISES - Hamstring Strain These exercises may help you when beginning to rehabilitate your injury. They may resolve your symptoms with or without further involvement from your physician, physical therapist or athletic trainer. While completing these exercises, remember:   Muscles can gain both the endurance and the strength needed for everyday activities through controlled exercises.  Complete these exercises as instructed by your physician, physical therapist or athletic trainer. Increase the resistance and repetitions only as guided.  You may experience muscle soreness or fatigue, but the pain or discomfort you are trying to eliminate should never get worse during these exercises. If this pain does get worse, stop and make certain you are following the directions exactly. If the pain is still present after adjustments, discontinue the exercise until you can discuss the trouble with your clinician. STRENGTH - Hip Extensors, Straight Leg Raises   Lie on your stomach on a firm surface.  Tense the muscles in your buttocks to lift your right / left leg about 4 inches. If you cannot lift your leg this high without arching your back, place a pillow under your hips.  Keep your knee straight. Hold for __________ seconds.  Slowly lower your leg to the starting position and allow it to relax completely before starting the next repetition. Repeat __________ times. Complete this exercise __________ times per day.  STRENGTH - Hamstring, Isometrics   Lie on your back on a firm surface.  Bend your right / left knee approximately __________ degrees.  Dig your heel into the surface, as if you are trying to pull it toward your buttocks.  Tighten the muscles in the back of your thighs to "dig" as hard as you can, without increasing any pain.  Hold this position for __________ seconds.  Release the tension gradually and allow your muscles to completely relax for __________ seconds between each exercise. Repeat __________ times. Complete this exercise __________ times per day.  STRENGTH - Hamstring, Curls   Lay on your stomach with your legs extended. (If you lay on a bed,  your feet may hang over the edge.)  Tighten the muscles in the back of your thigh to bend your right / left knee up to 90 degrees. Keep your hips flat on the bed or floor.  Hold this position for __________ seconds.  Slowly lower your leg back to the starting position. Repeat __________ times. Complete this exercise __________ times per day.  OPTIONAL ANKLE WEIGHTS: Begin with ____________________, but DO NOT exceed ____________________. Increase in 1 pound/0.5 kilogram increments. Document Released: 05/28/2005 Document Revised: 08/20/2011 Document Reviewed: 09/09/2008 [facility] Patient Information 2015 [place], [place]. This information is not intended to replace advice given to you by your health care provider. Make sure you discuss any questions you have with your health care provider.       I personally performed the services described in this documentation, which was scribed in my presence. The recorded information has been reviewed and considered, and addended by me as needed.

## 2014-01-28 NOTE — Patient Instructions (Addendum)
Your xray was ok, but this may be early bronchitis or worsening from prior cough. Can start Zpak, mucinex over the counter (stop mucinex if wheezing or increased wheezing), and albuterol if needed for wheeze. Return to the clinic or go to the nearest emergency room if any of your symptoms worsen or new symptoms occur.   I suspect your knee pain is in part from your hamstrings-  Overuse or strain. Try some of the stretches below, range of motion and if needed ice to area on and off for 15 minutes. If this is not improving in next 2 weeks (or worse sooner) - return for recheck.   Hamstring Strain with Rehab The hamstring muscle and tendons are vulnerable to muscle or tendon tear (strain). Hamstring tears cause pain and inflammation in the backside of the thigh, where the hamstring muscles are located. The hamstring is comprised of three muscles that are responsible for straightening the hip, bending the knee, and stabilizing the knee. These muscles are important for walking, running, and jumping. Hamstring strain is the most common injury of the thigh. Hamstring strains are classified as grade 1 or 2 strains. Grade 1 strains cause pain, but the tendon is not lengthened. Grade 2 strains include a lengthened ligament due to the ligament being stretched or partially ruptured. With grade 2 strains there is still function, although the function may be decreased.  SYMPTOMS   Pain, tenderness, swelling, warmth, or redness over the hamstring muscles, at the back of the thigh.  Pain that gets worse during and after intense activity.  A "pop" heard in the area, at the time of injury.  Muscle spasm in the hamstring muscles.  Pain or weakness with running, jumping, or bending the knee against resistance.  Crackling sound (crepitation) when the tendon is moved or touched.  Bruising (contusion) in the thigh within 48 hours of injury.  Loss of fullness of the muscle, or area of muscle bulging in the case of a  complete rupture. CAUSES  A muscle strain occurs when a force is placed on the muscle or tendon that is greater than it can withstand. Common causes of injury include:  Strain from overuse or sudden increase in the frequency, duration, or intensity of activity.  Single violent blow or force to the back of the knee or the hamstring area of the thigh. RISK INCREASES WITH:  Sports that require quick starts (sprinting, racquetball, tennis).  Sports that require jumping (basketball, volleyball).  Kicking sports and water skiing.  Contact sports (soccer, football).  Poor strength and flexibility.  Failure to warm up properly before activity.  Previous thigh, knee, or pelvis injury.  Poor exercise technique.  Poor posture. PREVENTION  Maintain physical fitness:  Strength, flexibility, and endurance.  Cardiovascular fitness.  Learn and use proper exercise technique and posture.  Wear proper fitted and padded protective equipment. PROGNOSIS  If treated properly, hamstring strains are usually curable in 2 to 6 weeks. RELATED COMPLICATIONS   Longer healing time, if not properly treated or if not given adequate time to heal.  Chronically inflamed tendon, causing persistent pain with activity that may progress to constant pain.  Recurring symptoms, if activity is resumed too soon.  Vulnerable to repeated injury (in up to 25% of cases). TREATMENT  Treatment first involves the use of ice and medication to help reduce pain and inflammation. It is also important to complete strengthening and stretching exercises, as well as modifying any activities that aggravate the symptoms. These exercises may  be completed at home or with a therapist. Your caregiver may recommend the use of crutches to help reduce pain and discomfort, especially is the strain is severe enough to cause limping. If the tendon has pulled away from the bone, then surgery is necessary to reattach it. MEDICATION   If  pain medicine is needed, nonsteroidal anti-inflammatory medicines (aspirin and ibuprofen), or other minor pain relievers (acetaminophen), are often advised.  Do not take pain medicine for 7 days before surgery.  Prescription pain relievers may be given if your caregiver thinks they are needed. Use only as directed and only as much as you need.  Corticosteroid injections may be recommended. However, these injections should only be used for serious cases, as they can only be given a certain number of times.  Ointments applied to the skin may be beneficial. HEAT AND COLD  Cold treatment (icing) relieves pain and reduces inflammation. Cold treatment should be applied for 10 to 15 minutes every 2 to 3 hours, and immediately after activity that aggravates your symptoms. Use ice packs or an ice massage.  Heat treatment may be used before performing the stretching and strengthening activities prescribed by your caregiver, physical therapist, or athletic trainer. Use a heat pack or a warm water soak. SEEK MEDICAL CARE IF:   Symptoms get worse or do not improve in 2 weeks, despite treatment.  New, unexplained symptoms develop. (Drugs used in treatment may produce side effects.) EXERCISES RANGE OF MOTION (ROM) AND STRETCHING EXERCISES - Hamstring Strain These exercises may help you when beginning to rehabilitate your injury. Your symptoms may go away with or without further involvement from your physician, physical therapist or athletic trainer. While completing these exercises, remember:   Restoring tissue flexibility helps normal motion to return to the joints. This allows healthier, less painful movement and activity.  An effective stretch should be held for at least 30 seconds.  A stretch should never be painful. You should only feel a gentle lengthening or release in the stretched tissue. STRETCH - Hamstrings, Standing  Stand or sit, and extend your right / left leg, placing your foot on a  chair or foot stool.  Keep a slight arch in your low back and your hips straight forward.  Lead with your chest, and lean forward at the waist until you feel a gentle stretch in the back of your right / left knee or thigh. (When done correctly, this exercise requires leaning only a small distance.)  Hold this position for __________ seconds. Repeat __________ times. Complete this stretch __________ times per day. STRETCH - Hamstrings, Supine   Lie on your back. Loop a belt or towel over the ball of your right / left foot.  Straighten your right / left knee and slowly pull on the belt to raise your leg. Do not allow the right / left knee to bend. Keep your opposite leg flat on the floor.  Raise the leg until you feel a gentle stretch behind your right / left knee or thigh. Hold this position for __________ seconds. Repeat __________ times. Complete this stretch __________ times per day.  STRETCH - Hamstrings, Doorway  Lie on your back with your right / left leg extended and resting on the wall, and the opposite leg flat on the ground through the door. Initially, position your bottom farther away from the wall.  Keep your right / left knee straight. If you feel a stretch behind your knee or thigh, hold this position for __________ seconds.  If you do not feel a stretch, scoot your bottom closer to the door and hold __________ seconds. Repeat __________ times. Complete this stretch __________ times per day.  STRETCH - Hamstrings/Adductors, V-Sit   Sit on the floor with your legs extended in a large "V," keeping your knees straight.  With your head and chest upright, bend at your waist reaching for your left foot to stretch your right thigh muscles.  You should feel a stretch in your right inner thigh. Hold for __________ seconds.  Return to the upright position to relax your leg muscles.  Continuing to keep your chest upright, bend straight forward at your waist to stretch your  hamstrings.  You should feel a stretch behind both of your thighs and knees. Hold for __________ seconds.  Return to the upright position to relax your leg muscles.  With your head and chest upright, bend at your waist reaching for your right foot to stretch your left thigh muscles.  You should feel a stretch in your left inner thigh. Hold for __________ seconds.  Return to the upright position to relax your leg muscles. Repeat __________ times. Complete this exercise __________ times per day.  STRENGTHENING EXERCISES - Hamstring Strain These exercises may help you when beginning to rehabilitate your injury. They may resolve your symptoms with or without further involvement from your physician, physical therapist or athletic trainer. While completing these exercises, remember:   Muscles can gain both the endurance and the strength needed for everyday activities through controlled exercises.  Complete these exercises as instructed by your physician, physical therapist or athletic trainer. Increase the resistance and repetitions only as guided.  You may experience muscle soreness or fatigue, but the pain or discomfort you are trying to eliminate should never get worse during these exercises. If this pain does get worse, stop and make certain you are following the directions exactly. If the pain is still present after adjustments, discontinue the exercise until you can discuss the trouble with your clinician. STRENGTH - Hip Extensors, Straight Leg Raises   Lie on your stomach on a firm surface.  Tense the muscles in your buttocks to lift your right / left leg about 4 inches. If you cannot lift your leg this high without arching your back, place a pillow under your hips.  Keep your knee straight. Hold for __________ seconds.  Slowly lower your leg to the starting position and allow it to relax completely before starting the next repetition. Repeat __________ times. Complete this exercise  __________ times per day.  STRENGTH - Hamstring, Isometrics   Lie on your back on a firm surface.  Bend your right / left knee approximately __________ degrees.  Dig your heel into the surface, as if you are trying to pull it toward your buttocks. Tighten the muscles in the back of your thighs to "dig" as hard as you can, without increasing any pain.  Hold this position for __________ seconds.  Release the tension gradually and allow your muscles to completely relax for __________ seconds between each exercise. Repeat __________ times. Complete this exercise __________ times per day.  STRENGTH - Hamstring, Curls   Lay on your stomach with your legs extended. (If you lay on a bed, your feet may hang over the edge.)  Tighten the muscles in the back of your thigh to bend your right / left knee up to 90 degrees. Keep your hips flat on the bed or floor.  Hold this position for __________ seconds.  Slowly lower your leg back to the starting position. Repeat __________ times. Complete this exercise __________ times per day.  OPTIONAL ANKLE WEIGHTS: Begin with ____________________, but DO NOT exceed ____________________. Increase in 1 pound/0.5 kilogram increments. Document Released: 05/28/2005 Document Revised: 08/20/2011 Document Reviewed: 09/09/2008 [facility] Patient Information 2015 [place], [place]. This information is not intended to replace advice given to you by your health care provider. Make sure you discuss any questions you have with your health care provider.

## 2014-02-24 ENCOUNTER — Encounter: Payer: Self-pay | Admitting: Family Medicine

## 2014-02-24 ENCOUNTER — Ambulatory Visit (INDEPENDENT_AMBULATORY_CARE_PROVIDER_SITE_OTHER): Payer: BC Managed Care – PPO

## 2014-02-24 ENCOUNTER — Ambulatory Visit (INDEPENDENT_AMBULATORY_CARE_PROVIDER_SITE_OTHER): Payer: BC Managed Care – PPO | Admitting: Family Medicine

## 2014-02-24 VITALS — BP 120/60 | HR 62 | Temp 98.1°F | Resp 16 | Ht 67.0 in | Wt 219.0 lb

## 2014-02-24 DIAGNOSIS — M25462 Effusion, left knee: Secondary | ICD-10-CM

## 2014-02-24 DIAGNOSIS — M79671 Pain in right foot: Secondary | ICD-10-CM

## 2014-02-24 DIAGNOSIS — M25469 Effusion, unspecified knee: Secondary | ICD-10-CM

## 2014-02-24 DIAGNOSIS — M25562 Pain in left knee: Secondary | ICD-10-CM

## 2014-02-24 DIAGNOSIS — M25569 Pain in unspecified knee: Secondary | ICD-10-CM

## 2014-02-24 DIAGNOSIS — M79609 Pain in unspecified limb: Secondary | ICD-10-CM

## 2014-02-24 MED ORDER — TRIAMCINOLONE ACETONIDE 40 MG/ML IJ SUSP
40.0000 mg | Freq: Once | INTRAMUSCULAR | Status: AC
  Administered 2014-02-24: 40 mg via INTRAMUSCULAR

## 2014-02-24 NOTE — Progress Notes (Deleted)
  Angelica Sharp - 48 y.o. female MRN [id number]  Date of birth: 1965/09/03  SUBJECTIVE:  Including CC & ROS.  patient C/O: *** Onset of symptoms: *** Symptoms: *** Relieving factors: *** Worsened by: ***   ROS:  Constitutional:  No fever, chills, or fatigue.  Respiratory:  No shortness of breath, cough, or wheezing Cardiovascular:  No palpitations, chest pain or syncope Gastrointestinal:  No nausea, no abdominal pain Review of systems otherwise negative except for what is stated in HPI  HISTORY: Past Medical, Surgical, Social, and Family History Reviewed & Updated per EMR. Pertinent Historical Findings include: ***  PHYSICAL EXAM:  VS: BP:120/60 mmHg  HR:62bpm  TEMP:98.1 F (36.7 C)( )  RESP:   HT:5\' 7"  (170.2 cm) (67 in)   WT:219 lb (99.338 kg)  BMI:34.4 PHYSICAL EXAM: General:  Alert and oriented, No acute distress.   HENT:  Normocephalic, Oral mucosa is moist.   Respiratory:  Lungs are clear to auscultation, Respirations are non-labored, Symmetrical chest wall expansion.   Cardiovascular:  Normal rate, Regular rhythm, No murmur, Good pulses equal in all extremities, No edema.   Gastrointestinal:  Soft, Non-tender, Non-distended, Normal bowel sounds, No organomegaly.   Integumentary:  Warm, Dry, No rash.   Neurologic:  Alert, Oriented, No focal defects Psychiatric:  Cooperative, Appropriate mood & affect.    ASSESSMENT & PLAN:

## 2014-02-24 NOTE — Progress Notes (Signed)
Subjective:    Patient ID: Angelica Sharp, female    DOB: May 03, 1966, 48 y.o.   MRN: [id number]  HPI Angelica Sharp is a 48 y.o. female  Here for follow up.  Last seen 01/28/14 for L knee pain. Suspected hamstring strain. Doing some stretches, but feels worse now. Hurts in knee even with lifting up leg or extending knee. tight feeling, giving way feeling if bending and sitting down, or bending back. NKI, but pain had presented after wearing boot for broken foot. More pain with pop in knee after standing a few weeks ago.  No previous L knee injury/surgery. Notes more swelling over past few days in L knee. Tx: ibuprofen, heating pad.   R foot pain - treated for 3rd mt fracture with initial injury about June 5, seen by [facility] June 25th and wore walking boot for 7 weeks, released 2-3 weeks ago - only had initial XR.  Did have pain on outside of R foot that has been there since initial injury. NO improvement in this area with wearing boot. Outside of foot still sore, middle foot ok.    Works in [employer], up and down from desk work and walking around the office.    Patient Active Problem List   Diagnosis Date Noted  . 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 Other and unspecified hyperlipidemia 09/10/2013  . Impaired glucose metabolism 09/10/2013  . Inconclusive mammogram 08/31/2013  . Family history of cancer 08/30/2012  . GERD (gastroesophageal reflux disease) 07/03/2011  . COUGH 04/18/2010  . EDEMA 01/24/2010  . CHEST PAIN 01/24/2010   Past Medical History  Diagnosis Date  . Chest pain   . SOB (shortness of breath)   . Edema     lower extremity  . Borderline hypertension   . Arthritis   . Family history of anesthesia complication     sister had BP complications during anes.  . Hypertension   . Degenerative lumbar disc     L5S1  . GERD (gastroesophageal reflux disease)   . H/O hiatal hernia   . Diverticulitis    Past Surgical History  Procedure Laterality  Date  . Tubal ligation    . Breast lumpectomy      3 lumps -- two from right -- one from left  . Laparoscopic assisted vaginal hysterectomy N/A 09/18/2012    Procedure: LAPAROSCOPIC ASSISTED VAGINAL HYSTERECTOMY;  Surgeon: Linda Hedges, DO;  Location: [facility];  Service: Gynecology;  Laterality: N/A;  BLADDER  DISTENTION   Allergies  Allergen Reactions  . Morphine And Related     Headache and hallucinations  . Septra [Sulfamethoxazole-Trimethoprim]     Increased heartrate   Prior to Admission medications   Medication Sig Start Date End Date Taking? Authorizing Provider  ALPRAZolam Duanne Moron) 0.25 MG tablet TAKE 1 TABLET EVERY DAY 09/10/13   Barton Fanny, MD  aspirin 81 MG tablet Take 81 mg by mouth daily.    Historical Provider, MD  calcium-vitamin D (OSCAL WITH D) 500-200 MG-UNIT per tablet Take 1 tablet by mouth.    Historical Provider, MD  Cholecalciferol (VITAMIN D PO) Take 1 capsule by mouth daily.    Historical Provider, MD  docusate sodium (COLACE) 100 MG capsule Take 200 mg by mouth at bedtime. For stool softner    Historical Provider, MD  esomeprazole (NEXIUM) 40 MG capsule Take 1 capsule (40 mg total) by mouth daily. 09/10/13   Barton Fanny, MD  fluconazole (DIFLUCAN) 150 MG tablet Take  1 tablet (150 mg total) by mouth once. 11/15/13   Areta Haber Dunn, PA-C  hydrochlorothiazide (HYDRODIURIL) 25 MG tablet Take 1 tablet (25 mg total) by mouth daily. 09/10/13   Barton Fanny, MD  ibuprofen (ADVIL) 600 MG tablet Take 1 tablet (600 mg total) by mouth every 6 (six) hours as needed for pain. 09/19/12   Megan Morris, DO  KRILL OIL PO Take by mouth.    Historical Provider, MD  metroNIDAZOLE (FLAGYL) 500 MG tablet Take 1 tablet (500 mg total) by mouth 3 (three) times daily. 11/15/13   Rise Mu, PA-C  Multiple Vitamin (MULITIVITAMIN WITH MINERALS) TABS Take 1 tablet by mouth daily.    Historical Provider, MD  naproxen sodium (ANAPROX) 220 MG tablet Take 440 mg by mouth daily as needed  (alternates with ibuprofen for pain).    Historical Provider, MD  Potassium Gluconate 550 MG TABS Take 1 tablet by mouth daily.    Historical Provider, MD   History   Social History  . Marital Status: Divorced    Spouse Name: N/A    Number of Children: N/A  . Years of Education: N/A   Occupational History  . Mortgage    Social History Main Topics  . Smoking status: Never Smoker   . Smokeless tobacco: Not on file  . Alcohol Use: No  . Drug Use: No  . Sexual Activity: Not on file   Other Topics Concern  . Not on file   Social History Narrative  . No narrative on file       Patient Active Problem List   Diagnosis Date Noted  . 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 Other and unspecified hyperlipidemia 09/10/2013  . Impaired glucose metabolism 09/10/2013  . Inconclusive mammogram 08/31/2013  . Family history of cancer 08/30/2012  . GERD (gastroesophageal reflux disease) 07/03/2011  . COUGH 04/18/2010  . EDEMA 01/24/2010  . CHEST PAIN 01/24/2010   Past Medical History  Diagnosis Date  . Chest pain   . SOB (shortness of breath)   . Edema     lower extremity  . Borderline hypertension   . Arthritis   . Family history of anesthesia complication     sister had BP complications during anes.  . Hypertension   . Degenerative lumbar disc     L5S1  . GERD (gastroesophageal reflux disease)   . H/O hiatal hernia   . Diverticulitis    Past Surgical History  Procedure Laterality Date  . Tubal ligation    . Breast lumpectomy      3 lumps -- two from right -- one from left  . Laparoscopic assisted vaginal hysterectomy N/A 09/18/2012    Procedure: LAPAROSCOPIC ASSISTED VAGINAL HYSTERECTOMY;  Surgeon: Linda Hedges, DO;  Location: [facility];  Service: Gynecology;  Laterality: N/A;  BLADDER  DISTENTION   Allergies  Allergen Reactions  . Morphine And Related     Headache and hallucinations  . Septra [Sulfamethoxazole-Trimethoprim]     Increased heartrate   Prior to  Admission medications   Medication Sig Start Date End Date Taking? Authorizing Provider  ALPRAZolam Duanne Moron) 0.25 MG tablet TAKE 1 TABLET EVERY DAY 09/10/13   Barton Fanny, MD  aspirin 81 MG tablet Take 81 mg by mouth daily.    Historical Provider, MD  calcium-vitamin D (OSCAL WITH D) 500-200 MG-UNIT per tablet Take 1 tablet by mouth.    Historical Provider, MD  Cholecalciferol (VITAMIN D PO) Take 1 capsule by mouth daily.  Historical Provider, MD  docusate sodium (COLACE) 100 MG capsule Take 200 mg by mouth at bedtime. For stool softner    Historical Provider, MD  esomeprazole (NEXIUM) 40 MG capsule Take 1 capsule (40 mg total) by mouth daily. 09/10/13   Barton Fanny, MD  fluconazole (DIFLUCAN) 150 MG tablet Take 1 tablet (150 mg total) by mouth once. 11/15/13   Areta Haber Dunn, PA-C  hydrochlorothiazide (HYDRODIURIL) 25 MG tablet Take 1 tablet (25 mg total) by mouth daily. 09/10/13   Barton Fanny, MD  ibuprofen (ADVIL) 600 MG tablet Take 1 tablet (600 mg total) by mouth every 6 (six) hours as needed for pain. 09/19/12   Megan Morris, DO  KRILL OIL PO Take by mouth.    Historical Provider, MD  metroNIDAZOLE (FLAGYL) 500 MG tablet Take 1 tablet (500 mg total) by mouth 3 (three) times daily. 11/15/13   Rise Mu, PA-C  Multiple Vitamin (MULITIVITAMIN WITH MINERALS) TABS Take 1 tablet by mouth daily.    Historical Provider, MD  naproxen sodium (ANAPROX) 220 MG tablet Take 440 mg by mouth daily as needed (alternates with ibuprofen for pain).    Historical Provider, MD  Potassium Gluconate 550 MG TABS Take 1 tablet by mouth daily.    Historical Provider, MD   History   Social History  . Marital Status: Divorced    Spouse Name: N/A    Number of Children: N/A  . Years of Education: N/A   Occupational History  . Mortgage    Social History Main Topics  . Smoking status: Never Smoker   . Smokeless tobacco: Not on file  . Alcohol Use: No  . Drug Use: No  . Sexual Activity: Not on file    Other Topics Concern  . Not on file   Social History Narrative  . No narrative on file    Review of Systems     Objective:   Physical Exam  Vitals reviewed. Constitutional: She is oriented to person, place, and time. She appears well-developed and well-nourished. No distress.  HENT:  Head: Normocephalic and atraumatic.  Pulmonary/Chest: Effort normal.  Musculoskeletal:       Left knee: She exhibits decreased range of motion (full ext, flex to 100 - decreased from R side. ) and effusion (1+ effusion. ). She exhibits no ecchymosis, no deformity, no LCL laxity, normal patellar mobility, no bony tenderness and no MCL laxity. Tenderness: min posteriorly/popliteal fossa, pain anteriorly at distal quads with flexion.        Right foot: She exhibits tenderness (mid 5th mt more than prximal 5th, no navicular or other MT ttp. NVI distally. ) and bony tenderness. She exhibits normal range of motion, no swelling, no crepitus and no deformity.  Negative lachman.  Pain with mcmurray - posteriorly, but no click.   Neurological: She is alert and oriented to person, place, and time.  Skin: Skin is warm and dry. No rash noted.  Psychiatric: She has a normal mood and affect. Her behavior is normal.  negative drawer on knee exam.  Filed Vitals:   02/24/14 1759  BP: 120/60  Pulse: 62  Temp: 98.1 F (36.7 C)  Resp: 16  Height: 5\' 7"  (1.702 m)  Weight: 219 lb (99.338 kg)    [facility] reading (PRIMARY) by  Dr. Carlota Raspberry: L knee : nad R foot - NAD, healed 3rd mt fracture.   Risks (including but not limited to bleeding and infection, or damage to surrounding structures), benefits, and alternatives discussed  for L knee injection, superolateral approach Verbal consent obtained after any questions were answered. Landmarks noted, and marked as needed. Area cleansed with Betadine x3, ethyl chloride spray for topical anesthesia, followed by alcohol swab.  Injected with 3cc lidocaine 1% plain, 1cc kenalog 40mg /ml.   No complications. Bandage applied.  RTC precautions discussed in regards to injection.     Assessment & Plan:  EDYN POPOCA is a 48 y.o. female Left knee pain - Plan: DG Knee Complete 4 Views Left, Knee swelling, left - Plan: DG Knee Complete 4 Views Left - DDX of degenerative meniscal tear, of flair of OA with use of boot, worsened swelling and instability. Discussed MRI, but declined at this point , and also discussed trial of nsiad and bracing, but would like injection. Injected as above. Aftercare and rtc precautions given. If not improved with this - MRI or ortho eval.   Right foot pain - Plan: DG Foot Complete Right ttp over 5th mT, but no fx identified. restart walking boot for 2 weeks, then if not improved - ortho eval or further imaging.   Patient Instructions  Cam walker for 2 more weeks on foot - if not improved then can refer you to orthopaedist.  For your knee - range of motion, over the counter brace if it feels better, then if not improving in next 2 weeks, we can refer you to ortho.  Return to the clinic or go to the nearest emergency room if any of your symptoms worsen or new symptoms occur.

## 2014-02-24 NOTE — Addendum Note (Signed)
Addended by: Dan Humphreys on: 02/24/2014 08:06 PM   Modules accepted: Orders

## 2014-02-24 NOTE — Patient Instructions (Signed)
Cam walker for 2 more weeks on foot - if not improved then can refer you to orthopaedist.  For your knee - range of motion, over the counter brace if it feels better, then if not improving in next 2 weeks, we can refer you to ortho.  Return to the clinic or go to the nearest emergency room if any of your symptoms worsen or new symptoms occur.

## 2014-03-08 ENCOUNTER — Other Ambulatory Visit: Payer: Self-pay | Admitting: Family Medicine

## 2014-03-12 ENCOUNTER — Ambulatory Visit (INDEPENDENT_AMBULATORY_CARE_PROVIDER_SITE_OTHER): Payer: BC Managed Care – PPO | Admitting: Family Medicine

## 2014-03-12 ENCOUNTER — Encounter: Payer: Self-pay | Admitting: Family Medicine

## 2014-03-12 VITALS — BP 116/75 | HR 79 | Temp 97.9°F | Resp 16 | Ht 67.0 in | Wt 217.0 lb

## 2014-03-12 DIAGNOSIS — E669 Obesity, unspecified: Secondary | ICD-10-CM

## 2014-03-12 DIAGNOSIS — Z23 Encounter for immunization: Secondary | ICD-10-CM

## 2014-03-12 DIAGNOSIS — E119 Type 2 diabetes mellitus without complications: Secondary | ICD-10-CM

## 2014-03-12 DIAGNOSIS — R7302 Impaired glucose tolerance (oral): Secondary | ICD-10-CM

## 2014-03-12 DIAGNOSIS — R7309 Other abnormal glucose: Secondary | ICD-10-CM

## 2014-03-12 LAB — POCT GLYCOSYLATED HEMOGLOBIN (HGB A1C): HEMOGLOBIN A1C: 7.1

## 2014-03-12 MED ORDER — HYDROCHLOROTHIAZIDE 25 MG PO TABS: ORAL_TABLET | ORAL | Status: DC

## 2014-03-12 MED ORDER — ALPRAZOLAM 0.25 MG PO TABS: ORAL_TABLET | ORAL | Status: DC

## 2014-03-12 MED ORDER — METFORMIN HCL 500 MG PO TABS: ORAL_TABLET | ORAL | Status: DC

## 2014-03-12 MED ORDER — ESOMEPRAZOLE MAGNESIUM 40 MG PO CPDR: DELAYED_RELEASE_CAPSULE | ORAL | Status: DC

## 2014-03-12 MED ORDER — GLUCOSE BLOOD VI STRP: ORAL_STRIP | Status: DC

## 2014-03-12 NOTE — Patient Instructions (Addendum)
Mediterranean Diet  Why follow it? Research shows.   Those who follow the Mediterranean diet have a reduced risk of heart disease    The diet is associated with a reduced incidence of Parkinson's and Alzheimer's diseases   People following the diet may have longer life expectancies and lower rates of chronic diseases    The Dietary Guidelines for Americans recommends the Mediterranean diet as an eating plan to promote health and prevent disease  What Is the Mediterranean Diet?    Healthy eating plan based on typical foods and recipes of Mediterranean-style cooking   The diet is primarily a plant based diet; these foods should make up a majority of meals   Starches - Plant based foods should make up a majority of meals - They are an important sources of vitamins, minerals, energy, antioxidants, and fiber - Choose whole grains, foods high in fiber and minimally processed items  - Typical grain sources include wheat, oats, barley, corn, brown rice, bulgar, farro, millet, polenta, couscous  - Various types of beans include chickpeas, lentils, fava beans, black beans, white beans   Fruits  Veggies - Large quantities of antioxidant rich fruits & veggies; 6 or more servings  - Vegetables can be eaten raw or lightly drizzled with oil and cooked  - Vegetables common to the traditional Mediterranean Diet include: artichokes, arugula, beets, broccoli, brussel sprouts, cabbage, carrots, celery, collard greens, cucumbers, eggplant, kale, leeks, lemons, lettuce, mushrooms, okra, onions, peas, peppers, potatoes, pumpkin, radishes, rutabaga, shallots, spinach, sweet potatoes, turnips, zucchini - Fruits common to the Mediterranean Diet include: apples, apricots, avocados, cherries, clementines, dates, figs, grapefruits, grapes, melons, nectarines, oranges, peaches, pears, pomegranates, strawberries, tangerines  Fats - Replace butter and margarine with healthy oils, such as olive oil, canola oil, and tahini   - Limit nuts to no more than a handful a day  - Nuts include walnuts, almonds, pecans, pistachios, pine nuts  - Limit or avoid candied, honey roasted or heavily salted nuts - Olives are central to the [employer] - can be eaten whole or used in a variety of dishes   Meats Protein - Limiting red meat: no more than a few times a month - When eating red meat: choose lean cuts and keep the portion to the size of deck of cards - Eggs: approx. 0 to 4 times a week  - Fish and lean poultry: at least 2 a week  - Healthy protein sources include, chicken, [place], lean beef, lamb - Increase intake of seafood such as tuna, salmon, trout, mackerel, shrimp, scallops - Avoid or limit high fat processed meats such as sausage and bacon  Dairy - Include moderate amounts of low fat dairy products  - Focus on healthy dairy such as fat free yogurt, skim milk, low or reduced fat cheese - Limit dairy products higher in fat such as whole or 2% milk, cheese, ice cream  Alcohol - Moderate amounts of red wine is ok  - No more than 5 oz daily for women (all ages) and men older than age 14  - No more than 10 oz of wine daily for men younger than 11  Other - Limit sweets and other desserts  - Use herbs and spices instead of salt to flavor foods  - Herbs and spices common to the traditional Mediterranean Diet include: basil, bay leaves, chives, cloves, cumin, fennel, garlic, lavender, marjoram, mint, oregano, parsley, pepper, rosemary, sage, savory, sumac, tarragon, thyme   It's not just a diet,  it's a lifestyle:    The Mediterranean diet includes lifestyle factors typical of those in the region    Foods, drinks and meals are best eaten with others and savored   Daily physical activity is important for overall good health   This could be strenuous exercise like running and aerobics   This could also be more leisurely activities such as walking, housework, yard-work, or taking the stairs   Moderation is the key;  a balanced and healthy diet accommodates most foods and drinks   Consider portion sizes and frequency of consumption of certain foods   Meal Ideas & Options:    Breakfast:  o Whole wheat toast or whole wheat English muffins with peanut butter & hard boiled egg o Steel cut oats topped with apples & cinnamon and skim milk  o Fresh fruit: banana, strawberries, melon, berries, peaches  o Smoothies: strawberries, bananas, greek yogurt, peanut butter o Low fat greek yogurt with blueberries and granola  o Egg white omelet with spinach and mushrooms o Breakfast couscous: whole wheat couscous, apricots, skim milk, cranberries    Sandwiches:  o Hummus and grilled vegetables (peppers, zucchini, squash) on whole wheat bread   o Grilled chicken on whole wheat pita with lettuce, tomatoes, cucumbers or tzatziki  o Tuna salad on whole wheat bread: tuna salad made with greek yogurt, olives, red peppers, capers, green onions o Garlic rosemary lamb pita: lamb sauted with garlic, rosemary, salt & pepper; add lettuce, cucumber, greek yogurt to pita - flavor with lemon juice and black pepper    Seafood:  o Mediterranean grilled salmon, seasoned with garlic, basil, parsley, lemon juice and black pepper o Shrimp, lemon, and spinach whole-grain pasta salad made with low fat greek yogurt  o Seared scallops with lemon orzo  o Seared tuna steaks seasoned salt, pepper, coriander topped with tomato mixture of olives, tomatoes, olive oil, minced garlic, parsley, green onions and cappers    Meats:  o Herbed greek chicken salad with kalamata olives, cucumber, feta  o Red bell peppers stuffed with spinach, bulgur, lean ground beef (or lentils) & topped with feta   o Kebabs: skewers of chicken, tomatoes, onions, zucchini, squash  o [place] burgers: made with red onions, mint, dill, lemon juice, feta cheese topped with roasted red peppers   Vegetarian o Cucumber salad: cucumbers, artichoke hearts, celery, red onion, feta  cheese, tossed in olive oil & lemon juice  o Hummus and whole grain pita points with a greek salad (lettuce, tomato, feta, olives, cucumbers, red onion) o Lentil soup with celery, carrots made with vegetable broth, garlic, salt and pepper  o Tabouli salad: parsley, bulgur, mint, scallions, cucumbers, tomato, radishes, lemon juice, olive oil, salt and pepper.    Diabetes and Exercise Exercising regularly is important. It is not just about losing weight. It has many health benefits, such as:  Improving your overall fitness, flexibility, and endurance.  Increasing your bone density.  Helping with weight control.  Decreasing your body fat.  Increasing your muscle strength.  Reducing stress and tension.  Improving your overall health. People with diabetes who exercise gain additional benefits because exercise:  Reduces appetite.  Improves the body's use of blood sugar (glucose).  Helps lower or control blood glucose.  Decreases blood pressure.  Helps control blood lipids (such as cholesterol and triglycerides).  Improves the body's use of the hormone insulin by:  Increasing the body's insulin sensitivity.  Reducing the body's insulin needs.  Decreases the risk for heart  disease because exercising:  Lowers cholesterol and triglycerides levels.  Increases the levels of good cholesterol (such as high-density lipoproteins [HDL]) in the body.  Lowers blood glucose levels. YOUR ACTIVITY PLAN  Choose an activity that you enjoy and set realistic goals. Your health care provider or diabetes educator can help you make an activity plan that works for you. Exercise regularly as directed by your health care provider. This includes:  Performing resistance training twice a week such as push-ups, sit-ups, lifting weights, or using resistance bands.  Performing 150 minutes of cardio exercises each week such as walking, running, or playing sports.  Staying active and spending no more  than 90 minutes at one time being inactive. Even short bursts of exercise are good for you. Three 10-minute sessions spread throughout the day are just as beneficial as a single 30-minute session. Some exercise ideas include:  Taking the dog for a walk.  Taking the stairs instead of the elevator.  Dancing to your favorite song.  Doing an exercise video.  Doing your favorite exercise with a friend. RECOMMENDATIONS FOR EXERCISING WITH TYPE 1 OR TYPE 2 DIABETES   Check your blood glucose before exercising. If blood glucose levels are greater than 240 mg/dL, check for urine ketones. Do not exercise if ketones are present.  Avoid injecting insulin into areas of the body that are going to be exercised. For example, avoid injecting insulin into:  The arms when playing tennis.  The legs when jogging.  Keep a record of:  Food intake before and after you exercise.  Expected peak times of insulin action.  Blood glucose levels before and after you exercise.  The type and amount of exercise you have done.  Review your records with your health care provider. Your health care provider will help you to develop guidelines for adjusting food intake and insulin amounts before and after exercising.  If you take insulin or oral hypoglycemic agents, watch for signs and symptoms of hypoglycemia. They include:  Dizziness.  Shaking.  Sweating.  Chills.  Confusion.  Drink plenty of water while you exercise to prevent dehydration or heat stroke. Body water is lost during exercise and must be replaced.  Talk to your health care provider before starting an exercise program to make sure it is safe for you. Remember, almost any type of activity is better than none. Document Released: 08/18/2003 Document Revised: 10/12/2013 Document Reviewed: 11/04/2012 [facility] Patient Information 2015 [place], [place]. This information is not intended to replace advice given to you by your health care provider.  Make sure you discuss any questions you have with your health care provider.    Diabetes and Standards of Medical Care Diabetes is complicated. You may find that your diabetes team includes a dietitian, nurse, diabetes educator, eye doctor, and more. To help everyone know what is going on and to help you get the care you deserve, the following schedule of care was developed to help keep you on track. Below are the tests, exams, vaccines, medicines, education, and plans you will need. HbA1c test This test shows how well you have controlled your glucose over the past 2-3 months. It is used to see if your diabetes management plan needs to be adjusted.   It is performed at least 2 times a year if you are meeting treatment goals.  It is performed 4 times a year if therapy has changed or if you are not meeting treatment goals. Blood pressure test  This test is performed at every  routine medical visit. The goal is less than 140/90 mm Hg for most people, but 130/80 mm Hg in some cases. Ask your health care provider about your goal. Dental exam  Follow up with the dentist regularly. Eye exam  If you are diagnosed with type 1 diabetes as a child, get an exam upon reaching the age of 8 years or older and have had diabetes for 3-5 years. Yearly eye exams are recommended after that initial eye exam.  If you are diagnosed with type 1 diabetes as an adult, get an exam within 5 years of diagnosis and then yearly.  If you are diagnosed with type 2 diabetes, get an exam as soon as possible after the diagnosis and then yearly. Foot care exam  Visual foot exams are performed at every routine medical visit. The exams check for cuts, injuries, or other problems with the feet.  A comprehensive foot exam should be done yearly. This includes visual inspection as well as assessing foot pulses and testing for loss of sensation.  Check your feet nightly for cuts, injuries, or other problems with your feet. Tell  your health care provider if anything is not healing. Kidney function test (urine microalbumin)  This test is performed once a year.  Type 1 diabetes: The first test is performed 5 years after diagnosis.  Type 2 diabetes: The first test is performed at the time of diagnosis.  A serum creatinine and estimated glomerular filtration rate (eGFR) test is done once a year to assess the level of chronic kidney disease (CKD), if present. Lipid profile (cholesterol, HDL, LDL, triglycerides)  Performed every 5 years for most people.  The goal for LDL is less than 100 mg/dL. If you are at high risk, the goal is less than 70 mg/dL.  The goal for HDL is 40 mg/dL-50 mg/dL for men and 50 mg/dL-60 mg/dL for women. An HDL cholesterol of 60 mg/dL or higher gives some protection against heart disease.  The goal for triglycerides is less than 150 mg/dL. Influenza vaccine, pneumococcal vaccine, and hepatitis B vaccine  The influenza vaccine is recommended yearly.  It is recommended that people with diabetes who are over 15 years old get the pneumonia vaccine. In some cases, two separate shots may be given. Ask your health care provider if your pneumonia vaccination is up to date.  The hepatitis B vaccine is also recommended for adults with diabetes. Diabetes self-management education  Education is recommended at diagnosis and ongoing as needed. Treatment plan  Your treatment plan is reviewed at every medical visit. Document Released: 03/25/2009 Document Revised: 10/12/2013 Document Reviewed: 10/28/2012 [facility] Patient Information 2015 [place], [place]. This information is not intended to replace advice given to you by your health care provider. Make sure you discuss any questions you have with your health care provider.

## 2014-03-12 NOTE — Progress Notes (Signed)
Subjective:    Patient ID: Angelica Sharp, female    DOB: 12/19/1965, 48 y.o.   MRN: [id number]  HPI  This 48 y.o. Cauc female is here for follow-up on hyperglycemia; last A1c= 6.5 % in  April 2015. Pt has tried to improve lifestyle but exercise efforts were hampered by ankle fracture and knee injury. She reports limited food preferences (eats almost no vegetables and only a few fruits); eats 1 meal most days of the week. Weight has not changed in 6 months. Pt's sister has suggested she try Byetta if she has to take medication for treatment of DM; pt was told this medication can help with weight loss.  Pt has GERD, effectively treated w/ Nexium. Trial off this PPI resulted in worsening reflux.   Patient Active Problem List   Diagnosis Date Noted  . Obesity 03/12/2014  . 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 Other and unspecified hyperlipidemia 09/10/2013  . Impaired glucose metabolism 09/10/2013  . Inconclusive mammogram 08/31/2013  . Family history of cancer 08/30/2012  . GERD (gastroesophageal reflux disease) 07/03/2011  . COUGH 04/18/2010  . EDEMA 01/24/2010  . CHEST PAIN 01/24/2010    Prior to Admission medications   Medication Sig Start Date End Date Taking? Authorizing Provider  ALPRAZolam Duanne Moron) 0.25 MG tablet TAKE 1 TABLET EVERY DAY   Yes Barton Fanny, MD  aspirin 81 MG tablet Take 81 mg by mouth daily.   Yes Historical Provider, MD  calcium-vitamin D (OSCAL WITH D) 500-200 MG-UNIT per tablet Take 1 tablet by mouth.   Yes Historical Provider, MD  Cholecalciferol (VITAMIN D PO) Take 1 capsule by mouth daily.   Yes Historical Provider, MD  docusate sodium (COLACE) 100 MG capsule Take 200 mg by mouth at bedtime. For stool softner   Yes Historical Provider, MD  esomeprazole (NEXIUM) 40 MG capsule TAKE 1 CAPSULE (40 MG TOTAL) BY MOUTH DAILY.   Yes Barton Fanny, MD  hydrochlorothiazide (HYDRODIURIL) 25 MG tablet TAKE 1 TABLET (25 MG TOTAL) BY MOUTH DAILY.    Yes Barton Fanny, MD  ibuprofen (ADVIL) 600 MG tablet Take 1 tablet (600 mg total) by mouth every 6 (six) hours as needed for pain. 09/19/12  Yes Megan Morris, DO  KRILL OIL PO Take by mouth.   Yes Historical Provider, MD  Multiple Vitamin (MULITIVITAMIN WITH MINERALS) TABS Take 1 tablet by mouth daily.    Historical Provider, MD    History   Social History  . Marital Status: Divorced    Spouse Name: N/A    Number of Children: N/A  . Years of Education: N/A   Occupational History  . Mortgage    Social History Main Topics  . Smoking status: Never Smoker   . Smokeless tobacco: Not on file  . Alcohol Use: No  . Drug Use: No  . Sexual Activity: Not on file   Other Topics Concern  . Not on file   Social History Narrative  . No narrative on file    Family History  Problem Relation Age of Onset  . Heart attack Mother   . Diabetes Mother   . Heart attack Sister   . Pulmonary embolism Father   . Diabetes Sister   . Kidney disease Sister     kidney failure     Review of Systems  Constitutional: Positive for activity change and appetite change. Negative for fatigue and unexpected weight change.       Food preferences are limited;  does not eat vegetables. Fruit- apples, oranges. Avoids nuts.  HENT: Negative.   Eyes: Negative for visual disturbance.  Respiratory: Negative for chest tightness and shortness of breath.   Cardiovascular: Negative.   Gastrointestinal: Negative.        Diverticular disease.  Endocrine: Negative.   Musculoskeletal:       Within last 6 months- fractured ankle then had torn cartilage in knee.  Skin: Negative.   Neurological: Negative.   Psychiatric/Behavioral: Negative.       Objective:   Physical Exam  Nursing note and vitals reviewed. Constitutional: She is oriented to person, place, and time. Vital signs are normal. She appears well-developed and well-nourished. No distress.  HENT:  Head: Normocephalic and atraumatic.  Right Ear:  Hearing and external ear normal.  Left Ear: Hearing and external ear normal.  Nose: Nose normal. No nasal deformity or septal deviation.  Mouth/Throat: Oropharynx is clear and moist.  Eyes: Conjunctivae and EOM are normal. Pupils are equal, round, and reactive to light. No scleral icterus.  Cardiovascular: Normal rate and regular rhythm.   Pulmonary/Chest: Effort normal. No respiratory distress.  Musculoskeletal: Normal range of motion. She exhibits no edema.  Neurological: She is alert and oriented to person, place, and time. No cranial nerve deficit. She exhibits normal muscle tone. Coordination normal.  Skin: Skin is warm. She is not diaphoretic. No erythema. No pallor.  Psychiatric: She has a normal mood and affect. Her behavior is normal. Judgment and thought content normal.     Results for orders placed in visit on 03/12/14  POCT GLYCOSYLATED HEMOGLOBIN (HGB A1C)      Result Value Ref Range   Hemoglobin A1C 7.1         Assessment & Plan:  Impaired glucose metabolism - Plan: POCT glycosylated hemoglobin (Hb A1C)  DM II (diabetes mellitus, type II), controlled - Trial Metformin 500 mg 1 tab once a day with largest meal. Test FSBS daily. Encouraged more balanced nutrition. Recommended Mediterranean Diet. Plan: For home use only DME Glucometer  Obesity- Resume regular physical activity when cleared by [facility]. Consider aquatic exercise program.  Needs flu shot - Plan: Flu Vaccine QUAD 36+ mos IM   Meds ordered this encounter  Medications  . hydrochlorothiazide (HYDRODIURIL) 25 MG tablet    Sig: TAKE 1 TABLET (25 MG TOTAL) BY MOUTH DAILY.    Dispense:  30 tablet    Refill:  11  . esomeprazole (NEXIUM) 40 MG capsule    Sig: TAKE 1 CAPSULE (40 MG TOTAL) BY MOUTH DAILY.    Dispense:  30 capsule    Refill:  11  . ALPRAZolam (XANAX) 0.25 MG tablet    Sig: TAKE 1 TABLET EVERY DAY    Dispense:  30 tablet    Refill:  0  . metFORMIN (GLUCOPHAGE) 500 MG tablet    Sig: Take 1  tablet once a day with largest meal or as directed.    Dispense:  90 tablet    Refill:  1  . glucose blood test strip    Sig: Use as instructed    Dispense:  50 each    Refill:  5    Provide strips for glucometer that is covered by her insurer. IDC-10= E11.9  Testing frequency= 1x per day. Pt will need other DM testing supplies (lancets, alcohol wipes, lancet device, etc)

## 2014-03-15 ENCOUNTER — Telehealth: Payer: Self-pay

## 2014-03-15 MED ORDER — GLUCOSE BLOOD VI STRP: ORAL_STRIP | Status: DC

## 2014-03-15 NOTE — Telephone Encounter (Signed)
Pt states she did not get strips for her meter    Best phone is [facility]

## 2014-03-15 NOTE — Telephone Encounter (Signed)
Strips were sent to [facility]. Reordered and sent to [facility] per pt request.

## 2014-03-30 ENCOUNTER — Telehealth: Payer: Self-pay

## 2014-03-30 NOTE — Telephone Encounter (Signed)
[facility] faxed req for Rxs for DM supplies. Checked w/pt who stated she would like to have Rxs sent to them. Completed and faxed.

## 2014-04-01 ENCOUNTER — Ambulatory Visit (INDEPENDENT_AMBULATORY_CARE_PROVIDER_SITE_OTHER): Payer: BC Managed Care – PPO | Admitting: Physician Assistant

## 2014-04-01 VITALS — BP 106/86 | HR 89 | Temp 98.6°F | Resp 16 | Ht 67.0 in | Wt 215.4 lb

## 2014-04-01 DIAGNOSIS — J069 Acute upper respiratory infection, unspecified: Secondary | ICD-10-CM

## 2014-04-01 DIAGNOSIS — R059 Cough, unspecified: Secondary | ICD-10-CM

## 2014-04-01 DIAGNOSIS — R05 Cough: Secondary | ICD-10-CM

## 2014-04-01 MED ORDER — BENZONATATE 100 MG PO CAPS: 100.0000 mg | ORAL_CAPSULE | Freq: Three times a day (TID) | ORAL | Status: DC | PRN

## 2014-04-01 MED ORDER — TRIAMCINOLONE ACETONIDE 55 MCG/ACT NA AERO: 2.0000 | INHALATION_SPRAY | Freq: Every day | NASAL | Status: DC

## 2014-04-01 NOTE — Patient Instructions (Signed)
Your symptoms are most likely due to a viral upper respiratory infection. Your physical exam was not worrisome for a bacterial infection.  Please take the tessalon 1-2 pill at a time, up to three times per day for cough as needed. Please use your hycodan you have at home as needed at night to help with the cough and help you sleep. Please do one nasacort spray in each nostril up to three times per day as needed for congestion. Remember the instructions we talked about....Marland Kitchenleaning forward while spraying, lightly sniffing after you spray, and using your opposite hand for each nostril. Get plenty of rest and drink plenty of fluids. Return to see [place] in one week if your symptoms have not improved.

## 2014-04-01 NOTE — Progress Notes (Signed)
Subjective:    Patient ID: Angelica Sharp, female    DOB: 12/28/1965, 48 y.o.   MRN: [id number]  Ellsworth Lennox, MD  Chief Complaint  Patient presents with  . Cough  . head/chest congestion mucus green/brown x 4 days   Patient Active Problem List   Diagnosis Date Noted  . Obesity 03/12/2014  . 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 Other and unspecified hyperlipidemia 09/10/2013  . Impaired glucose metabolism 09/10/2013  . Inconclusive mammogram 08/31/2013  . Family history of cancer 08/30/2012  . GERD (gastroesophageal reflux disease) 07/03/2011  . COUGH 04/18/2010  . EDEMA 01/24/2010  . CHEST PAIN 01/24/2010   Prior to Admission medications   Medication Sig Start Date End Date Taking? Authorizing Provider  ALPRAZolam Duanne Moron) 0.25 MG tablet TAKE 1 TABLET EVERY DAY 03/12/14  Yes Barton Fanny, MD  aspirin 81 MG tablet Take 81 mg by mouth daily.   Yes Historical Provider, MD  calcium-vitamin D (OSCAL WITH D) 500-200 MG-UNIT per tablet Take 1 tablet by mouth.   Yes Historical Provider, MD  Cholecalciferol (VITAMIN D PO) Take 1 capsule by mouth daily.   Yes Historical Provider, MD  docusate sodium (COLACE) 100 MG capsule Take 200 mg by mouth at bedtime. For stool softner   Yes Historical Provider, MD  esomeprazole (NEXIUM) 40 MG capsule TAKE 1 CAPSULE (40 MG TOTAL) BY MOUTH DAILY. 03/12/14  Yes Barton Fanny, MD  glucose blood test strip Use as instructed 03/15/14  Yes Chelle S Jeffery, PA-C  hydrochlorothiazide (HYDRODIURIL) 25 MG tablet TAKE 1 TABLET (25 MG TOTAL) BY MOUTH DAILY. 03/12/14  Yes Barton Fanny, MD  ibuprofen (ADVIL) 600 MG tablet Take 1 tablet (600 mg total) by mouth every 6 (six) hours as needed for pain. 09/19/12  Yes Megan Morris, DO  KRILL OIL PO Take by mouth.   Yes Historical Provider, MD  metFORMIN (GLUCOPHAGE) 500 MG tablet Take 1 tablet once a day with largest meal or as directed. 03/12/14  Yes Barton Fanny, MD  Multiple  Vitamin (MULITIVITAMIN WITH MINERALS) TABS Take 1 tablet by mouth daily.   Yes Historical Provider, MD   Medications, allergies, past medical history, surgical history, family history, social history and problem list reviewed and updated.  Cough   48 yof with PMH recently diagnosed DMII and several prior bronchitis infections presents with cough and congestion that began 4 days ago. Began with cough that was not productive at first but became prod 2 days ago. Green initially then [employer] and today. No particular time cough comes on, has been throughout day.   Congestion began 3 days ago. No rhinorrhea. Sneezing. No fevers, chills at home. Thought it was dry air from starting furnace this week but has not improved with humidifier in bedroom. Scratchy throat. No ear pain. No abd pain, N/V, diarrhea.   Has albuterol inhaler at home. Started using yest when she was feeling tightness in chest. Used twice yest and once today without relief. Tightness in chest has continued after starting 2 days ago. No chest tightness with exertion. No syncope, presyncope.   Sudafed initially helped but no longer. Has taken twice daily for past few days. Hycodan at home has only used once, helped to sleep at night.   Was seen here in Aug 2015 with bronchitis and given azithro which helped at that time. She has been feeling improved in regards to cough since the Zpack until 4 days ago.   Review of Systems  Respiratory: Positive for cough.    See HPI.     Objective:   Physical Exam  Constitutional: She is oriented to person, place, and time. She appears well-developed and well-nourished.  Non-toxic appearance. She does not have a sickly appearance. She does not appear ill. No distress.  BP 106/86  Pulse 89  Temp(Src) 98.6 F (37 C) (Oral)  Resp 16  Ht 5\' 7"  (1.702 m)  Wt 215 lb 6.4 oz (97.705 kg)  BMI 33.73 kg/m2  SpO2 97%  LMP 08/10/2012   HENT:  Right Ear: Hearing, tympanic membrane, external  ear and ear canal normal. Tympanic membrane is not erythematous.  Left Ear: Hearing, tympanic membrane, external ear and ear canal normal. Tympanic membrane is not erythematous.  Nose: Nose normal. No mucosal edema or rhinorrhea. Right sinus exhibits no maxillary sinus tenderness and no frontal sinus tenderness. Left sinus exhibits no maxillary sinus tenderness and no frontal sinus tenderness.  Mouth/Throat: Oropharynx is clear and moist and mucous membranes are normal. No oropharyngeal exudate, posterior oropharyngeal edema or posterior oropharyngeal erythema.  Cardiovascular: Normal rate, regular rhythm, S1 normal, S2 normal and normal heart sounds.  Exam reveals no gallop.   No murmur heard. Pulmonary/Chest: Effort normal and breath sounds normal. No apnea and not tachypneic. No respiratory distress. She has no decreased breath sounds. She has no wheezes. She has no rhonchi. She has no rales.  Lymphadenopathy:       Head (right side): No submental, no submandibular and no tonsillar adenopathy present.       Head (left side): No submental, no submandibular and no tonsillar adenopathy present.  Neurological: She is alert and oriented to person, place, and time.  Psychiatric: She has a normal mood and affect. Her speech is normal.      Assessment & Plan:   48 yof with PMH several bronchitis episodes over past year presents with cough/congestion.   Cough - Plan: benzonatate (TESSALON) 100 MG capsule --Continue hycodan at home at night prn --Tessalon as needed --Continue humidifier --Declines mucinex  URI (upper respiratory infection) - Plan: triamcinolone (NASACORT AQ) 55 MCG/ACT AERO nasal inhaler --Nasacort as needed --Tylenol as needed for scratchy throat --Rest/fluids --RTC one week if symptoms not improved  Julieta Gutting, PA-C Physician Assistant-Certified [facility]  04/01/2014 4:03 PM

## 2014-04-01 NOTE — Progress Notes (Signed)
I was directly involved with the patient's care and agree with the physical, diagnosis and treatment plan.  

## 2014-04-09 ENCOUNTER — Other Ambulatory Visit: Payer: Self-pay | Admitting: Family Medicine

## 2014-04-16 ENCOUNTER — Telehealth: Payer: Self-pay

## 2014-04-16 NOTE — Telephone Encounter (Signed)
Called pt who verified that she does want DM supplies from this company. The [facility] pharm we sent to last sends the initial Rx and then [facility] continues to supply. Sent Rx form to 104 for Dr Leward Quan to sign

## 2014-04-30 ENCOUNTER — Telehealth: Payer: Self-pay

## 2014-04-30 ENCOUNTER — Ambulatory Visit (INDEPENDENT_AMBULATORY_CARE_PROVIDER_SITE_OTHER): Payer: BC Managed Care – PPO | Admitting: Emergency Medicine

## 2014-04-30 VITALS — BP 126/84 | HR 88 | Resp 20 | Ht 67.0 in | Wt 218.4 lb

## 2014-04-30 DIAGNOSIS — G959 Disease of spinal cord, unspecified: Secondary | ICD-10-CM

## 2014-04-30 DIAGNOSIS — R109 Unspecified abdominal pain: Secondary | ICD-10-CM

## 2014-04-30 DIAGNOSIS — E119 Type 2 diabetes mellitus without complications: Secondary | ICD-10-CM

## 2014-04-30 DIAGNOSIS — M542 Cervicalgia: Secondary | ICD-10-CM

## 2014-04-30 DIAGNOSIS — R5383 Other fatigue: Secondary | ICD-10-CM

## 2014-04-30 LAB — TSH: TSH: 1.396 u[IU]/mL (ref 0.350–4.500)

## 2014-04-30 LAB — POCT CBC
Granulocyte percent: 73.1 %G (ref 37–80)
HEMATOCRIT: 40.3 % (ref 37.7–47.9)
Hemoglobin: 12.6 g/dL (ref 12.2–16.2)
LYMPH, POC: 1.9 (ref 0.6–3.4)
MCH: 25 pg — AB (ref 27–31.2)
MCHC: 31.3 g/dL — AB (ref 31.8–35.4)
MCV: 79.8 fL — AB (ref 80–97)
MID (cbc): 0.3 (ref 0–0.9)
MPV: 7.4 fL (ref 0–99.8)
POC Granulocyte: 6 (ref 2–6.9)
POC LYMPH PERCENT: 23.6 %L (ref 10–50)
POC MID %: 3.3 %M (ref 0–12)
Platelet Count, POC: 318 10*3/uL (ref 142–424)
RBC: 5.05 M/uL (ref 4.04–5.48)
RDW, POC: 14.7 %
WBC: 8.2 10*3/uL (ref 4.6–10.2)

## 2014-04-30 LAB — COMPREHENSIVE METABOLIC PANEL
ALT: 36 U/L — ABNORMAL HIGH (ref 0–35)
AST: 32 U/L (ref 0–37)
Albumin: 4 g/dL (ref 3.5–5.2)
Alkaline Phosphatase: 87 U/L (ref 39–117)
BILIRUBIN TOTAL: 0.4 mg/dL (ref 0.2–1.2)
BUN: 11 mg/dL (ref 6–23)
CHLORIDE: 101 meq/L (ref 96–112)
CO2: 24 meq/L (ref 19–32)
Calcium: 9.4 mg/dL (ref 8.4–10.5)
Creat: 0.84 mg/dL (ref 0.50–1.10)
GLUCOSE: 154 mg/dL — AB (ref 70–99)
Potassium: 4 mEq/L (ref 3.5–5.3)
Sodium: 138 mEq/L (ref 135–145)
Total Protein: 7 g/dL (ref 6.0–8.3)

## 2014-04-30 LAB — POCT GLYCOSYLATED HEMOGLOBIN (HGB A1C): HEMOGLOBIN A1C: 6.6

## 2014-04-30 LAB — GLUCOSE, POCT (MANUAL RESULT ENTRY): POC GLUCOSE: 149 mg/dL — AB (ref 70–99)

## 2014-04-30 MED ORDER — NAPROXEN SODIUM 550 MG PO TABS: 550.0000 mg | ORAL_TABLET | Freq: Two times a day (BID) | ORAL | Status: DC

## 2014-04-30 NOTE — Patient Instructions (Signed)
Pt go to [facility] Saturday 05/01/2014 for your 4:00pm appointment to have your MRI Neck completed. The preadmission department will be calling you to go over a few questions this afternoon.  Their contact information is [place] [phone]

## 2014-04-30 NOTE — Telephone Encounter (Signed)
Patient is scheduled tomorrow at 6:45pm for a MRI of her neck. Patient is requesting to have an MRI of her left knee also. She was here today and saw Dr Ouida Sills whom ordered the MRI of her neck. Patient stated she didn't mention to Dr Ouida Sills about her knee but has seen Dr Carlota Raspberry for it in the past. I informed patient I would submit her request but was unsure if our office would have enough time to process her request by tomorrow. Patients call back number is [phone]

## 2014-04-30 NOTE — Progress Notes (Addendum)
[facility] [address], [place] [address] [phone]  Date:  04/30/2014   Name:  Angelica Sharp   DOB:  [birth year]   MRN:  [id number]  PCP:  Angelica Lennox, MD    Chief Complaint: Leg Pain; Arm Pain; Shortness of Breath; Fatigue; and Joint Swelling   History of Present Illness:  Angelica Sharp is a 48 y.o. very pleasant female patient who presents with the following:  Treated for meniscus tear with steroid injection and improved.  Now has increasing pain again in knee Since start of knee pain, has developed constant pain in a stocking like distribution pain in the left arm and lef. No numbness, tingling or weakness.  Intermittent burning pain in left mid arm that passes. No history of injury or overuse.  Has been feeling fatigued and not well rested due to awakening with pain over the past week or so.   Thinks she has experienced leg swelling in left leg at the end of the day.  No improvement with over the counter medications or other home remedies.  Denies other complaint or health concern today.   Patient Active Problem List   Diagnosis Date Noted  . Obesity 03/12/2014  . 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 Other and unspecified hyperlipidemia 09/10/2013  . Impaired glucose metabolism 09/10/2013  . Inconclusive mammogram 08/31/2013  . Family history of cancer 08/30/2012  . GERD (gastroesophageal reflux disease) 07/03/2011  . COUGH 04/18/2010  . EDEMA 01/24/2010  . CHEST PAIN 01/24/2010    Past Medical History  Diagnosis Date  . Chest pain   . SOB (shortness of breath)   . Edema     lower extremity  . Borderline hypertension   . Arthritis   . Family history of anesthesia complication     sister had BP complications during anes.  . Hypertension   . Degenerative lumbar disc     L5S1  . GERD (gastroesophageal reflux disease)   . H/O hiatal hernia   . Diverticulitis     Past Surgical History  Procedure Laterality  Date  . Tubal ligation    . Breast lumpectomy      3 lumps -- two from right -- one from left  . Laparoscopic assisted vaginal hysterectomy N/A 09/18/2012    Procedure: LAPAROSCOPIC ASSISTED VAGINAL HYSTERECTOMY;  Surgeon: Linda Hedges, DO;  Location: [facility];  Service: Gynecology;  Laterality: N/A;  BLADDER  DISTENTION    History  Substance Use Topics  . Smoking status: Never Smoker   . Smokeless tobacco: Not on file  . Alcohol Use: No    Family History  Problem Relation Age of Onset  . Heart attack Mother   . Diabetes Mother   . Heart attack Sister   . Pulmonary embolism Father   . Diabetes Sister   . Kidney disease Sister     kidney failure    Allergies  Allergen Reactions  . Morphine And Related     Headache and hallucinations  . Septra [Sulfamethoxazole-Trimethoprim]     Increased heartrate    Medication list has been reviewed and updated.  Current Outpatient Prescriptions on File Prior to Visit  Medication Sig Dispense Refill  . ALPRAZolam (XANAX) 0.25 MG tablet TAKE 1 TABLET EVERY DAY 30 tablet 0  . aspirin 81 MG tablet Take 81 mg by mouth daily.    . calcium-vitamin D (OSCAL WITH D) 500-200 MG-UNIT per tablet Take 1 tablet by mouth.    Marland Kitchen  docusate sodium (COLACE) 100 MG capsule Take 200 mg by mouth at bedtime. For stool softner    . esomeprazole (NEXIUM) 40 MG capsule TAKE 1 CAPSULE (40 MG TOTAL) BY MOUTH DAILY. 30 capsule 11  . glucose blood test strip Use as instructed 50 each 5  . hydrochlorothiazide (HYDRODIURIL) 25 MG tablet TAKE 1 TABLET (25 MG TOTAL) BY MOUTH DAILY. 30 tablet 11  . ibuprofen (ADVIL) 600 MG tablet Take 1 tablet (600 mg total) by mouth every 6 (six) hours as needed for pain. 30 tablet 0  . KRILL OIL PO Take by mouth.    . metFORMIN (GLUCOPHAGE) 500 MG tablet Take 1 tablet once a day with largest meal or as directed. 90 tablet 1  . Multiple Vitamin (MULITIVITAMIN WITH MINERALS) TABS Take 1 tablet by mouth daily.    . Cholecalciferol (VITAMIN  D PO) Take 1 capsule by mouth daily.    Marland Kitchen triamcinolone (NASACORT AQ) 55 MCG/ACT AERO nasal inhaler Place 2 sprays into the nose daily. 1 Inhaler 12  . [DISCONTINUED] calcium carbonate 200 MG capsule TWICE WEEKLY    . [DISCONTINUED] ranitidine (ZANTAC) 150 MG capsule Take 150 mg by mouth every evening.     No current facility-administered medications on file prior to visit.    Review of Systems:  As per HPI, otherwise negative.    Physical Examination: Filed Vitals:   04/30/14 0845  BP: 126/84  Pulse: 88  Resp: 20   Filed Vitals:   04/30/14 0845  Height: 5\' 7"  (1.702 m)  Weight: 218 lb 6.4 oz (99.066 kg)   Body mass index is 34.2 kg/(m^2). Ideal Body Weight: Weight in (lb) to have BMI = 25: 159.3  GEN: WDWN, NAD, Non-toxic, A & O x 3 HEENT: Atraumatic, Normocephalic. Neck supple. No masses, No LAD. Ears and Nose: No external deformity. CV: RRR, No M/G/R. No JVD. No thrill. No extra heart sounds. PULM: CTA B, no wheezes, crackles, rhonchi. No retractions. No resp. distress. No accessory muscle use. ABD: S, NT, ND, +BS. No rebound. No HSM. EXTR: No c/c/e NEURO Normal gait. Normal romberg and tandem gait.  Normal motor and cerebellar upper and lower PSYCH: Normally interactive. Conversant. Not depressed or anxious appearing.  Calm demeanor.    Assessment and Plan: Left sided pain Mri  Signed,  Ellison Carwin, MD   Results for orders placed or performed in visit on 04/30/14  POCT CBC  Result Value Ref Range   WBC 8.2 4.6 - 10.2 K/uL   Lymph, poc 1.9 0.6 - 3.4   POC LYMPH PERCENT 23.6 10 - 50 %L   MID (cbc) 0.3 0 - 0.9   POC MID % 3.3 0 - 12 %M   POC Granulocyte 6.0 2 - 6.9   Granulocyte percent 73.1 37 - 80 %G   RBC 5.05 4.04 - 5.48 M/uL   Hemoglobin 12.6 12.2 - 16.2 g/dL   HCT, POC 40.3 37.7 - 47.9 %   MCV 79.8 (A) 80 - 97 fL   MCH, POC 25.0 (A) 27 - 31.2 pg   MCHC 31.3 (A) 31.8 - 35.4 g/dL   RDW, POC 14.7 %   Platelet Count, POC 318 142 - 424 K/uL    MPV 7.4 0 - 99.8 fL  POCT glucose (manual entry)  Result Value Ref Range   POC Glucose 149 (A) 70 - 99 mg/dl  POCT glycosylated hemoglobin (Hb A1C)  Result Value Ref Range   Hemoglobin A1C 6.6    Results for  orders placed or performed in visit on 04/30/14  Comprehensive metabolic panel  Result Value Ref Range   Sodium 138 135 - 145 mEq/L   Potassium 4.0 3.5 - 5.3 mEq/L   Chloride 101 96 - 112 mEq/L   CO2 24 19 - 32 mEq/L   Glucose, Bld 154 (H) 70 - 99 mg/dL   BUN 11 6 - 23 mg/dL   Creat 0.84 0.50 - 1.10 mg/dL   Total Bilirubin 0.4 0.2 - 1.2 mg/dL   Alkaline Phosphatase 87 39 - 117 U/L   AST 32 0 - 37 U/L   ALT 36 (H) 0 - 35 U/L   Total Protein 7.0 6.0 - 8.3 g/dL   Albumin 4.0 3.5 - 5.2 g/dL   Calcium 9.4 8.4 - 10.5 mg/dL  TSH  Result Value Ref Range   TSH 1.396 0.350 - 4.500 uIU/mL  POCT CBC  Result Value Ref Range   WBC 8.2 4.6 - 10.2 K/uL   Lymph, poc 1.9 0.6 - 3.4   POC LYMPH PERCENT 23.6 10 - 50 %L   MID (cbc) 0.3 0 - 0.9   POC MID % 3.3 0 - 12 %M   POC Granulocyte 6.0 2 - 6.9   Granulocyte percent 73.1 37 - 80 %G   RBC 5.05 4.04 - 5.48 M/uL   Hemoglobin 12.6 12.2 - 16.2 g/dL   HCT, POC 40.3 37.7 - 47.9 %   MCV 79.8 (A) 80 - 97 fL   MCH, POC 25.0 (A) 27 - 31.2 pg   MCHC 31.3 (A) 31.8 - 35.4 g/dL   RDW, POC 14.7 %   Platelet Count, POC 318 142 - 424 K/uL   MPV 7.4 0 - 99.8 fL  POCT glucose (manual entry)  Result Value Ref Range   POC Glucose 149 (A) 70 - 99 mg/dl  POCT glycosylated hemoglobin (Hb A1C)  Result Value Ref Range   Hemoglobin A1C 6.6

## 2014-04-30 NOTE — Telephone Encounter (Signed)
Please advise ok to order MRI knee also. I have pended.

## 2014-05-01 ENCOUNTER — Ambulatory Visit (HOSPITAL_BASED_OUTPATIENT_CLINIC_OR_DEPARTMENT_OTHER): Payer: BC Managed Care – PPO

## 2014-05-01 ENCOUNTER — Telehealth: Payer: Self-pay

## 2014-05-01 NOTE — Addendum Note (Signed)
Addended by: Constance Goltz on: 05/01/2014 08:37 AM   Modules accepted: Orders

## 2014-05-01 NOTE — Telephone Encounter (Signed)
Pt called saying that she wants to cancel her MRI at [facility] because she found it cheaper somewhere else. Sent the call to Butch Lajada to help her with this.

## 2014-05-03 ENCOUNTER — Telehealth: Payer: Self-pay | Admitting: Radiology

## 2014-05-03 DIAGNOSIS — M25562 Pain in left knee: Secondary | ICD-10-CM

## 2014-05-03 MED ORDER — METFORMIN HCL 500 MG PO TABS: ORAL_TABLET | ORAL | Status: DC

## 2014-05-03 NOTE — Addendum Note (Signed)
Addended by: Roselee Culver on: 05/03/2014 09:59 AM   Modules accepted: Orders

## 2014-05-03 NOTE — Telephone Encounter (Signed)
Dr Carlota Raspberry-  This patient would like a MRI of her Left Knee.  It has not improved.  According to your September OV it is ok to either make ortho referral or MRI.  I pended the MRI order.

## 2014-05-03 NOTE — Telephone Encounter (Signed)
Dr Carlota Raspberry- this patient would like a MRI of her left knee.  It has not improved.  According to your September notes it is ok to wither make ortho referral or MRI.  Which would you like to move forward with?  I pended the MRI order.

## 2014-05-03 NOTE — Telephone Encounter (Signed)
Can start with MRI, then depending on results, can refer to ortho if needed. rtc if worsens, including if knee locks. Prior to MRI, make sure she has not had any metal in body, pacemaker, etc. Let me know if any questions.

## 2014-05-19 ENCOUNTER — Telehealth: Payer: Self-pay

## 2014-05-19 NOTE — Telephone Encounter (Signed)
Angelica Sharp Please make the test a plain MRI not an MRA.  Angelica Sharp

## 2014-05-19 NOTE — Telephone Encounter (Signed)
Notified [facility], no angiogram needed.

## 2014-05-19 NOTE — Telephone Encounter (Signed)
Lori from [employer] called to ask Dr Ouida Sills if pt needs the Angiogram (MRA)? Reading OV notes, she thinks that pt may just need the MRI Cervical w/o, and the MRI Knee. She stated that the only thing the angiogram will show is the blood vessels, and she is not sure that Dr Ouida Sills wanted to check this? Dr Ouida Sills, please advise.

## 2014-05-21 ENCOUNTER — Ambulatory Visit
Admission: RE | Admit: 2014-05-21 | Discharge: 2014-05-21 | Disposition: A | Discharge status: Home / Self Care | Payer: BC Managed Care – PPO | Source: Ambulatory Visit | Attending: Family Medicine | Admitting: Family Medicine

## 2014-05-21 ENCOUNTER — Telehealth: Payer: Self-pay

## 2014-05-21 ENCOUNTER — Ambulatory Visit
Admission: RE | Admit: 2014-05-21 | Discharge: 2014-05-21 | Disposition: A | Discharge status: Home / Self Care | Payer: BC Managed Care – PPO | Source: Ambulatory Visit | Attending: Emergency Medicine | Admitting: Emergency Medicine

## 2014-05-21 ENCOUNTER — Inpatient Hospital Stay: Admission: RE | Admit: 2014-05-21 | Payer: No Typology Code available for payment source | Source: Ambulatory Visit

## 2014-05-21 ENCOUNTER — Ambulatory Visit (INDEPENDENT_AMBULATORY_CARE_PROVIDER_SITE_OTHER): Payer: BC Managed Care – PPO | Admitting: Physician Assistant

## 2014-05-21 VITALS — BP 124/84 | HR 101 | Temp 98.4°F | Resp 18 | Ht 66.5 in | Wt 214.2 lb

## 2014-05-21 DIAGNOSIS — T50905A Adverse effect of unspecified drugs, medicaments and biological substances, initial encounter: Secondary | ICD-10-CM

## 2014-05-21 DIAGNOSIS — M25562 Pain in left knee: Secondary | ICD-10-CM

## 2014-05-21 DIAGNOSIS — D72829 Elevated white blood cell count, unspecified: Secondary | ICD-10-CM

## 2014-05-21 DIAGNOSIS — G959 Disease of spinal cord, unspecified: Secondary | ICD-10-CM

## 2014-05-21 DIAGNOSIS — R300 Dysuria: Secondary | ICD-10-CM

## 2014-05-21 DIAGNOSIS — R350 Frequency of micturition: Secondary | ICD-10-CM

## 2014-05-21 DIAGNOSIS — J069 Acute upper respiratory infection, unspecified: Secondary | ICD-10-CM

## 2014-05-21 DIAGNOSIS — R112 Nausea with vomiting, unspecified: Secondary | ICD-10-CM

## 2014-05-21 DIAGNOSIS — R1030 Lower abdominal pain, unspecified: Secondary | ICD-10-CM

## 2014-05-21 DIAGNOSIS — M542 Cervicalgia: Secondary | ICD-10-CM

## 2014-05-21 LAB — POCT UA - MICROSCOPIC ONLY
Bacteria, U Microscopic: NEGATIVE
Casts, Ur, LPF, POC: NEGATIVE
Crystals, Ur, HPF, POC: NEGATIVE
Mucus, UA: POSITIVE
WBC, UR, HPF, POC: NEGATIVE
Yeast, UA: NEGATIVE

## 2014-05-21 LAB — POCT URINALYSIS DIPSTICK
BILIRUBIN UA: NEGATIVE
GLUCOSE UA: NEGATIVE
KETONES UA: NEGATIVE
LEUKOCYTES UA: NEGATIVE
Nitrite, UA: POSITIVE
PH UA: 5.5
Protein, UA: NEGATIVE
Spec Grav, UA: 1.015
Urobilinogen, UA: 0.2

## 2014-05-21 LAB — POCT CBC
Granulocyte percent: 83.3 %G — AB (ref 37–80)
HEMATOCRIT: 40 % (ref 37.7–47.9)
HEMOGLOBIN: 12.5 g/dL (ref 12.2–16.2)
Lymph, poc: 1.7 (ref 0.6–3.4)
MCH: 25.3 pg — AB (ref 27–31.2)
MCHC: 31.2 g/dL — AB (ref 31.8–35.4)
MCV: 80.9 fL (ref 80–97)
MID (cbc): 0.5 (ref 0–0.9)
MPV: 7.7 fL (ref 0–99.8)
POC Granulocyte: 11.1 — AB (ref 2–6.9)
POC LYMPH PERCENT: 12.6 %L (ref 10–50)
POC MID %: 4.1 % (ref 0–12)
Platelet Count, POC: 315 10*3/uL (ref 142–424)
RBC: 4.94 M/uL (ref 4.04–5.48)
RDW, POC: 15.8 %
WBC: 13.3 10*3/uL — AB (ref 4.6–10.2)

## 2014-05-21 MED ORDER — LEVOFLOXACIN 750 MG PO TABS: 750.0000 mg | ORAL_TABLET | Freq: Every day | ORAL | Status: AC

## 2014-05-21 MED ORDER — CIPROFLOXACIN HCL 250 MG PO TABS: 250.0000 mg | ORAL_TABLET | Freq: Two times a day (BID) | ORAL | Status: DC

## 2014-05-21 MED ORDER — METRONIDAZOLE 500 MG PO TABS: 500.0000 mg | ORAL_TABLET | Freq: Three times a day (TID) | ORAL | Status: DC

## 2014-05-21 MED ORDER — ONDANSETRON HCL 4 MG PO TABS: 4.0000 mg | ORAL_TABLET | Freq: Three times a day (TID) | ORAL | Status: AC | PRN

## 2014-05-21 MED ORDER — METRONIDAZOLE 500 MG PO TABS: 500.0000 mg | ORAL_TABLET | Freq: Three times a day (TID) | ORAL | Status: AC

## 2014-05-21 NOTE — Telephone Encounter (Signed)
Patient went to pick her rx from the pharmacy, she was given cipro, levaquin and flagyl. She says The doctor never discussed cipro with her so she wanted to be sure this is what she should have before taking. She also states that zofran should have been called in for nausea and was not.

## 2014-05-21 NOTE — Patient Instructions (Addendum)
You may take over the counter mucinex for the sinus symptoms.   Stay hydrated and rest. Follow up with your primary care provider to recheck your urinalysis in 1 month. Please return if your symptoms do not improve in 7 days.    Urinary Tract Infection Urinary tract infections (UTIs) can develop anywhere along your urinary tract. Your urinary tract is your body's drainage system for removing wastes and extra water. Your urinary tract includes two kidneys, two ureters, a bladder, and a urethra. Your kidneys are a pair of bean-shaped organs. Each kidney is about the size of your fist. They are located below your ribs, one on each side of your spine. CAUSES Infections are caused by microbes, which are microscopic organisms, including fungi, viruses, and bacteria. These organisms are so small that they can only be seen through a microscope. Bacteria are the microbes that most commonly cause UTIs. SYMPTOMS  Symptoms of UTIs may vary by age and gender of the patient and by the location of the infection. Symptoms in young women typically include a frequent and intense urge to urinate and a painful, burning feeling in the bladder or urethra during urination. Older women and men are more likely to be tired, shaky, and weak and have muscle aches and abdominal pain. A fever may mean the infection is in your kidneys. Other symptoms of a kidney infection include pain in your back or sides below the ribs, nausea, and vomiting. DIAGNOSIS To diagnose a UTI, your caregiver will ask you about your symptoms. Your caregiver also will ask to provide a urine sample. The urine sample will be tested for bacteria and white blood cells. White blood cells are made by your body to help fight infection. TREATMENT  Typically, UTIs can be treated with medication. Because most UTIs are caused by a bacterial infection, they usually can be treated with the use of antibiotics. The choice of antibiotic and length of treatment depend on  your symptoms and the type of bacteria causing your infection. HOME CARE INSTRUCTIONS  If you were prescribed antibiotics, take them exactly as your caregiver instructs you. Finish the medication even if you feel better after you have only taken some of the medication.  Drink enough water and fluids to keep your urine clear or pale yellow.  Avoid caffeine, tea, and carbonated beverages. They tend to irritate your bladder.  Empty your bladder often. Avoid holding urine for long periods of time.  Empty your bladder before and after sexual intercourse.  After a bowel movement, women should cleanse from front to back. Use each tissue only once. SEEK MEDICAL CARE IF:   You have back pain.  You develop a fever.  Your symptoms do not begin to resolve within 3 days. SEEK IMMEDIATE MEDICAL CARE IF:   You have severe back pain or lower abdominal pain.  You develop chills.  You have nausea or vomiting.  You have continued burning or discomfort with urination. MAKE SURE YOU:   Understand these instructions.  Will watch your condition.  Will get help right away if you are not doing well or get worse. Document Released: 03/07/2005 Document Revised: 11/27/2011 Document Reviewed: 07/06/2011 [facility] Patient Information 2015 [place], [place]. This information is not intended to replace advice given to you by your health care provider. Make sure you discuss any questions you have with your health care provider. Diverticulitis Diverticulitis is inflammation or infection of small pouches in your colon that form when you have a condition called diverticulosis. The  pouches in your colon are called diverticula. Your colon, or large intestine, is where water is absorbed and stool is formed. Complications of diverticulitis can include:  Bleeding.  Severe infection.  Severe pain.  Perforation of your colon.  Obstruction of your colon. CAUSES  Diverticulitis is caused by  bacteria. Diverticulitis happens when stool becomes trapped in diverticula. This allows bacteria to grow in the diverticula, which can lead to inflammation and infection. RISK FACTORS People with diverticulosis are at risk for diverticulitis. Eating a diet that does not include enough fiber from fruits and vegetables may make diverticulitis more likely to develop. SYMPTOMS  Symptoms of diverticulitis may include:  Abdominal pain and tenderness. The pain is normally located on the left side of the abdomen, but may occur in other areas.  Fever and chills.  Bloating.  Cramping.  Nausea.  Vomiting.  Constipation.  Diarrhea.  Blood in your stool. DIAGNOSIS  Your health care provider will ask you about your medical history and do a physical exam. You may need to have tests done because many medical conditions can cause the same symptoms as diverticulitis. Tests may include:  Blood tests.  Urine tests.  Imaging tests of the abdomen, including X-rays and CT scans. When your condition is under control, your health care provider may recommend that you have a colonoscopy. A colonoscopy can show how severe your diverticula are and whether something else is causing your symptoms. TREATMENT  Most cases of diverticulitis are mild and can be treated at home. Treatment may include:  Taking over-the-counter pain medicines.  Following a clear liquid diet.  Taking antibiotic medicines by mouth for 7-10 days. More severe cases may be treated at a hospital. Treatment may include:  Not eating or drinking.  Taking prescription pain medicine.  Receiving antibiotic medicines through an IV tube.  Receiving fluids and nutrition through an IV tube.  Surgery. HOME CARE INSTRUCTIONS   Follow your health care provider's instructions carefully.  Follow a full liquid diet or other diet as directed by your health care provider. After your symptoms improve, your health care provider may tell you  to change your diet. He or she may recommend you eat a high-fiber diet. Fruits and vegetables are good sources of fiber. Fiber makes it easier to pass stool.  Take fiber supplements or probiotics as directed by your health care provider.  Only take medicines as directed by your health care provider.  Keep all your follow-up appointments. SEEK MEDICAL CARE IF:   Your pain does not improve.  You have a hard time eating food.  Your bowel movements do not return to normal. SEEK IMMEDIATE MEDICAL CARE IF:   Your pain becomes worse.  Your symptoms do not get better.  Your symptoms suddenly get worse.  You have a fever.  You have repeated vomiting.  You have bloody or black, tarry stools. MAKE SURE YOU:   Understand these instructions.  Will watch your condition.  Will get help right away if you are not doing well or get worse. Document Released: 03/07/2005 Document Revised: 06/02/2013 Document Reviewed: 04/22/2013 [facility] Patient Information 2015 [place], [place]. This information is not intended to replace advice given to you by your health care provider. Make sure you discuss any questions you have with your health care provider.

## 2014-05-21 NOTE — Progress Notes (Addendum)
[facility] [address] [place] [address] [phone]  Date:  05/21/2014   Name:  Angelica Sharp   DOB:  [birth year]   MRN:  [id number]  PCP:  Ellsworth Lennox, MD    Chief Complaint: Abdominal Pain; Increased Frequency; and Sinus Congestion   History of Present Illness:  Lauralynn Sharp is a 48 y.o. very pleasant female patient who presents with the following:  She complains of a burning sensation with urination and suprapubic pain that occurred about 1.5 days ago.  She took azo, for relief which helped, but a suprapubic pain occurred.  She states it is very painful-- "worse than my pregnancies" and "feels like something inside is twisting".  It occurs erratically although they do surface after hydrating, and are relieved with urination.  She has felt nauseous, but does not vomit, and also denies hematuria or urinary frequency.  She denies constipation and diarrhea.  She uses stool softener for preventative measures of her diverticulitis.  She also has no blood in stool.  She also denies change in vaginal discharge, odor, or itching.  She has a familial history of colon cancer from her mother.  She had a colonoscopy 5 years ago with the diverticulitis, and was reported to return at 5.  She endorses a diverticulosis-friendly diet avoiding.    She also complains of 2 day sinus pressure.  This began 2 days ago with sinus pressure and the following day she was hoarse. She checks her glucose a couple times per day.  In the morning, fasting glucose is about 96.  By afternoon, she has a non-fasting glucose of 175.  She states that she eats starches and drinks a caffeinated regular soda every morning.    She was concerned that her glucose would rise because she they increased her medication.    Patient Active Problem List   Diagnosis Date Noted  . Obesity 03/12/2014  . 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 Other and unspecified hyperlipidemia 09/10/2013  . Impaired  glucose metabolism 09/10/2013  . Inconclusive mammogram 08/31/2013  . Family history of cancer 08/30/2012  . GERD (gastroesophageal reflux disease) 07/03/2011  . COUGH 04/18/2010  . EDEMA 01/24/2010  . CHEST PAIN 01/24/2010    Past Medical History  Diagnosis Date  . Chest pain   . SOB (shortness of breath)   . Edema     lower extremity  . Borderline hypertension   . Arthritis   . Family history of anesthesia complication     sister had BP complications during anes.  . Hypertension   . Degenerative lumbar disc     L5S1  . GERD (gastroesophageal reflux disease)   . H/O hiatal hernia   . Diverticulitis     Past Surgical History  Procedure Laterality Date  . Tubal ligation    . Breast lumpectomy      3 lumps -- two from right -- one from left  . Laparoscopic assisted vaginal hysterectomy N/A 09/18/2012    Procedure: LAPAROSCOPIC ASSISTED VAGINAL HYSTERECTOMY;  Surgeon: Linda Hedges, DO;  Location: [facility];  Service: Gynecology;  Laterality: N/A;  BLADDER  DISTENTION    History  Substance Use Topics  . Smoking status: Never Smoker   . Smokeless tobacco: Not on file  . Alcohol Use: No    Family History  Problem Relation Age of Onset  . Heart attack Mother   . Diabetes Mother   . Heart attack Sister   . Pulmonary embolism Father   .  Diabetes Sister   . Kidney disease Sister     kidney failure    Allergies  Allergen Reactions  . Morphine And Related     Headache and hallucinations  . Septra [Sulfamethoxazole-Trimethoprim]     Increased heartrate    Medication list has been reviewed and updated.  Current Outpatient Prescriptions on File Prior to Visit  Medication Sig Dispense Refill  . ALPRAZolam (XANAX) 0.25 MG tablet TAKE 1 TABLET EVERY DAY 30 tablet 0  . aspirin 81 MG tablet Take 81 mg by mouth daily.    . calcium-vitamin D (OSCAL WITH D) 500-200 MG-UNIT per tablet Take 1 tablet by mouth.    . Cholecalciferol (VITAMIN D PO) Take 1 capsule by mouth daily.     Marland Kitchen docusate sodium (COLACE) 100 MG capsule Take 200 mg by mouth at bedtime. For stool softner    . esomeprazole (NEXIUM) 40 MG capsule TAKE 1 CAPSULE (40 MG TOTAL) BY MOUTH DAILY. 30 capsule 11  . glucose blood test strip Use as instructed 50 each 5  . hydrochlorothiazide (HYDRODIURIL) 25 MG tablet TAKE 1 TABLET (25 MG TOTAL) BY MOUTH DAILY. 30 tablet 11  . ibuprofen (ADVIL) 600 MG tablet Take 1 tablet (600 mg total) by mouth every 6 (six) hours as needed for pain. 30 tablet 0  . KRILL OIL PO Take by mouth.    . metFORMIN (GLUCOPHAGE) 500 MG tablet Take one twice daily for one week, then 2 tabs twice daily 120 tablet 5  . Multiple Vitamin (MULITIVITAMIN WITH MINERALS) TABS Take 1 tablet by mouth daily.    . naproxen sodium (ANAPROX DS) 550 MG tablet Take 1 tablet (550 mg total) by mouth 2 (two) times daily with a meal. 40 tablet 0  . triamcinolone (NASACORT AQ) 55 MCG/ACT AERO nasal inhaler Place 2 sprays into the nose daily. 1 Inhaler 12  . [DISCONTINUED] calcium carbonate 200 MG capsule TWICE WEEKLY    . [DISCONTINUED] ranitidine (ZANTAC) 150 MG capsule Take 150 mg by mouth every evening.     No current facility-administered medications on file prior to visit.  Marland Kitchenthis  Review of Systems: No hematuria, chills, vomiting, melena, blood in stool, constipation, diarrhea.     Physical Examination: Filed Vitals:   05/21/14 1337  BP: 124/84  Pulse: 101  Temp: 98.4 F (36.9 C)  Resp: 18   Filed Vitals:   05/21/14 1337  Height: 5' 6.5" (1.689 m)  Weight: 214 lb 3.2 oz (97.16 kg)   Wt Readings from Last 3 Encounters:  05/21/14 214 lb 3.2 oz (97.16 kg)  04/30/14 218 lb 6.4 oz (99.066 kg)  04/01/14 215 lb 6.4 oz (97.705 kg)    Body mass index is 34.06 kg/(m^2). Ideal Body Weight: Weight in (lb) to have BMI = 25: 156.9   Constitutional: Cooperative, alert, and pleasant female in NAD HEENT: Nasal rhinorrhea with mildly edematous mucosa.  No oropharyngeal exudate, erythema, or  swelling. Heart: No murmurs, gallops or rubs.  Normal rate and rhythm.   Abdomen: Normal bowel sounds.  No masses or skin changes.  Central obesity.  Suprapubic tenderness upon palpation.    Results for orders placed or performed in visit on 05/21/14  POCT UA - Microscopic Only  Result Value Ref Range   WBC, Ur, HPF, POC neg    RBC, urine, microscopic 3-5    Bacteria, U Microscopic neg    Mucus, UA pos    Epithelial cells, urine per micros 1-3    Crystals, Ur, HPF,  POC neg    Casts, Ur, LPF, POC neg    Yeast, UA neg   POCT urinalysis dipstick  Result Value Ref Range   Color, UA light orange    Clarity, UA clear    Glucose, UA neg    Bilirubin, UA neg    Ketones, UA neg    Spec Grav, UA 1.015    Blood, UA trace-intact    pH, UA 5.5    Protein, UA neg    Urobilinogen, UA 0.2    Nitrite, UA pos    Leukocytes, UA Negative   POCT CBC  Result Value Ref Range   WBC 13.3 (A) 4.6 - 10.2 K/uL   Lymph, poc 1.7 0.6 - 3.4   POC LYMPH PERCENT 12.6 10 - 50 %L   MID (cbc) 0.5 0 - 0.9   POC MID % 4.1 0 - 12 %M   POC Granulocyte 11.1 (A) 2 - 6.9   Granulocyte percent 83.3 (A) 37 - 80 %G   RBC 4.94 4.04 - 5.48 M/uL   Hemoglobin 12.5 12.2 - 16.2 g/dL   HCT, POC 40.0 37.7 - 47.9 %   MCV 80.9 80 - 97 fL   MCH, POC 25.3 (A) 27 - 31.2 pg   MCHC 31.2 (A) 31.8 - 35.4 g/dL   RDW, POC 15.8 %   Platelet Count, POC 315 142 - 424 K/uL   MPV 7.7 0 - 99.8 fL      Assessment and Plan: 48 year old female is here today for chief complaint suprapubic pain.  Her urinalysis show some nitrites and elevated white count.  I will treat her for both suspicion of diverticulitis, and uti.  Starting levaquin and metronidazole, to which she will take for 10 days.   She also has recurrent urinary burning sensation which self resolves.  She was referred to urology and chose not to attend.  I recommended that in 1 month she have a ua recheck here or with pcp, with possible urology consult.  Will followup with  urine culture results.  Also discussed with patient to always have a morning fasting glucose along with checks that she states was recommended by provider.  Also reassured that her metformin will not cause hypoglycemia.  Increased frequency of urination POCT UA - Microscopic Only, POCT urinalysis dipstick, Urine culture, POCT CBC   Burning with urination  Urine culture, POCT CBC  Lower abdominal pain POCT CBC   URI Most likely viral etiology, although antibiotics for GI/GU should clear any bacteria infection.    Ivar Drape, PA-C [facility] 12/11/20152:49 PM

## 2014-05-22 ENCOUNTER — Telehealth: Payer: Self-pay | Admitting: Physician Assistant

## 2014-05-22 NOTE — Telephone Encounter (Signed)
"  Spoke to patient, advised patient to take the levaquin. The zofran was ordered yesterday for her nausea. She states that she took the cipro, but she will discontinue and start the levaquin. She states that the back pain has improved greatly."  From [place]

## 2014-05-23 LAB — URINE CULTURE

## 2014-05-25 NOTE — Telephone Encounter (Signed)
Patient wants to know which of the two antibiotic to stop taking due to nausea.   The zofran is not working either  [phone]

## 2014-05-25 NOTE — Telephone Encounter (Signed)
Pt is taking Flagyl and levaquin. Please advise what pt can do for the nausea or if she needs to stop medication.

## 2014-05-26 NOTE — Telephone Encounter (Signed)
Spoke with patient of nausea.  Advised patient to take just the levaquin, increase fluids and food intake.   She will discontinue the metronidazole for now.  I will contact her within the next few days to see if she is tolerating the medication well

## 2014-05-26 NOTE — Telephone Encounter (Signed)
Patient calling back to check the to see what other medications are going to be called in for her to replace "Levaquin" and "Flagyl". Patient also requesting the results of her urine culture and her mri results of left knee. For the medications please call in to [facility] in [place]. Patients call back number is [phone]

## 2014-05-31 ENCOUNTER — Telehealth: Payer: Self-pay

## 2014-05-31 DIAGNOSIS — M25369 Other instability, unspecified knee: Secondary | ICD-10-CM

## 2014-05-31 DIAGNOSIS — M25569 Pain in unspecified knee: Secondary | ICD-10-CM

## 2014-05-31 NOTE — Telephone Encounter (Signed)
Call patient. Apologize for delay in reaching her with these results.  (I did put in a result note on the 15th, but not sure if it may not have gotten routed)  MRI of her knee indicated degeneration/wear of back part of medial meniscus. No discrete tear was seen. As we already tried injection in September, would recommend ortho eval if still symptomatic. Let me know and I can refer her.  Routed to [facility] as she plans on calling her as well.

## 2014-05-31 NOTE — Telephone Encounter (Signed)
Pt states she was told Dr Carlota Raspberry would call her re her mri results   Best phone for pt is [phone]

## 2014-05-31 NOTE — Telephone Encounter (Signed)
Dr. Carlota Raspberry  - Please advise.

## 2014-06-02 ENCOUNTER — Telehealth: Payer: Self-pay | Admitting: Physician Assistant

## 2014-06-02 ENCOUNTER — Ambulatory Visit (INDEPENDENT_AMBULATORY_CARE_PROVIDER_SITE_OTHER): Payer: BC Managed Care – PPO | Admitting: Family Medicine

## 2014-06-02 VITALS — BP 110/72 | HR 77 | Temp 98.3°F | Resp 16 | Ht 68.0 in | Wt 213.6 lb

## 2014-06-02 DIAGNOSIS — K5732 Diverticulitis of large intestine without perforation or abscess without bleeding: Secondary | ICD-10-CM

## 2014-06-02 DIAGNOSIS — M25562 Pain in left knee: Secondary | ICD-10-CM

## 2014-06-02 DIAGNOSIS — R350 Frequency of micturition: Secondary | ICD-10-CM

## 2014-06-02 DIAGNOSIS — G8929 Other chronic pain: Secondary | ICD-10-CM

## 2014-06-02 LAB — POCT CBC
Granulocyte percent: 71.5 %G (ref 37–80)
HCT, POC: 41.3 % (ref 37.7–47.9)
Hemoglobin: 13 g/dL (ref 12.2–16.2)
LYMPH, POC: 2.6 (ref 0.6–3.4)
MCH, POC: 25.4 pg — AB (ref 27–31.2)
MCHC: 31.5 g/dL — AB (ref 31.8–35.4)
MCV: 80.7 fL (ref 80–97)
MID (cbc): 0.7 (ref 0–0.9)
MPV: 7.1 fL (ref 0–99.8)
POC GRANULOCYTE: 8.4 — AB (ref 2–6.9)
POC LYMPH %: 22.1 % (ref 10–50)
POC MID %: 6.4 %M (ref 0–12)
Platelet Count, POC: 315 10*3/uL (ref 142–424)
RBC: 5.12 M/uL (ref 4.04–5.48)
RDW, POC: 15.8 %
WBC: 11.7 10*3/uL — AB (ref 4.6–10.2)

## 2014-06-02 LAB — POCT UA - MICROSCOPIC ONLY
CRYSTALS, UR, HPF, POC: NEGATIVE
Casts, Ur, LPF, POC: NEGATIVE
Mucus, UA: NEGATIVE
RBC, urine, microscopic: NEGATIVE
Yeast, UA: NEGATIVE

## 2014-06-02 LAB — POCT URINALYSIS DIPSTICK
Bilirubin, UA: NEGATIVE
Blood, UA: NEGATIVE
GLUCOSE UA: NEGATIVE
KETONES UA: NEGATIVE
LEUKOCYTES UA: NEGATIVE
NITRITE UA: NEGATIVE
Protein, UA: NEGATIVE
Spec Grav, UA: 1.02
Urobilinogen, UA: 0.2
pH, UA: 5

## 2014-06-02 MED ORDER — TRAMADOL HCL 50 MG PO TABS: 50.0000 mg | ORAL_TABLET | Freq: Three times a day (TID) | ORAL | Status: DC | PRN

## 2014-06-02 NOTE — Progress Notes (Signed)
Subjective: Patient was seen by Angelica Sharp about 11 days ago for a diverticulitis. She ended up not being able to tolerate the metronidazole. She continued the full course of the Levaquin. She had been somehow given a prescription for ciprofloxacin also, and after a few days when she was still having problems she started the ciprofloxacin. This overlapped with the Levaquin, but the Levaquin is now finished and she has only one ciprofloxacin left. She is continued with low abdominal midline pain. This is in the same area that she had pain back when she had the original attack. At that time she did have CT scan and colonoscopy was done at some point confirming she did have sigmoid diverticula. She has had several episodes of diverticulitis.  Has not been running a fever. She did have urinary tract infection also which has now resolved. Objective: Bowel sounds are present. Abdomen is soft but mildly tenderness across the low midline abdomen. No CVA tenderness  Assessment: Diverticulitis Urinary tract infection resolved Persistent low abdominal pain  Plan: Results for orders placed or performed in visit on 06/02/14  POCT urinalysis dipstick  Result Value Ref Range   Color, UA yellow    Clarity, UA clear    Glucose, UA neg    Bilirubin, UA neg    Ketones, UA neg    Spec Grav, UA 1.020    Blood, UA neg    pH, UA 5.0    Protein, UA neg    Urobilinogen, UA 0.2    Nitrite, UA neg    Leukocytes, UA Negative   POCT UA - Microscopic Only  Result Value Ref Range   WBC, Ur, HPF, POC 1-5    RBC, urine, microscopic neg    Bacteria, U Microscopic trace    Mucus, UA neg    Epithelial cells, urine per micros 10-15    Crystals, Ur, HPF, POC neg    Casts, Ur, LPF, POC neg    Yeast, UA neg   POCT CBC  Result Value Ref Range   WBC 11.7 (A) 4.6 - 10.2 K/uL   Lymph, poc 2.6 0.6 - 3.4   POC LYMPH PERCENT 22.1 10 - 50 %L   MID (cbc) 0.7 0 - 0.9   POC MID % 6.4 0 - 12 %M   POC Granulocyte 8.4 (A) 2 -  6.9   Granulocyte percent 71.5 37 - 80 %G   RBC 5.12 4.04 - 5.48 M/uL   Hemoglobin 13.0 12.2 - 16.2 g/dL   HCT, POC 41.3 37.7 - 47.9 %   MCV 80.7 80 - 97 fL   MCH, POC 25.4 (A) 27 - 31.2 pg   MCHC 31.5 (A) 31.8 - 35.4 g/dL   RDW, POC 15.8 %   Platelet Count, POC 315 142 - 424 K/uL   MPV 7.1 0 - 99.8 fL   With the continued elevation of white count of think we need to go ahead and do a CT scan of the abdomen to make sure she does not have a little abscess persisting. If the CT is good, we will give her a couple of days off of antibiotics and see how she is doing. If pain gets in all worse she should be retreated, probably with Levaquin and clindamycin. Certainly if the CT scan shows something of more concern she might need to see a Psychologist, sport and exercise.

## 2014-06-02 NOTE — Patient Instructions (Addendum)
CT scan of abdomen and pelvis will be scheduled to assess the status of your diverticulitis.  Finish the last Cipro today.  Take tramadol every 6 or 8 hours if needed for knee pain while referral is pending

## 2014-06-02 NOTE — Telephone Encounter (Addendum)
Spoke with patient yesterday.  Patient continues to have lower abdominal pain though much improved from initial visit 12 days ago.  She has finished the levaquin per instruction, and is just finishing the cipro (which she overlapped, though instructed to discontinue after one dose).  She remains off of the metronidazole per instruction as it n/v with levo/metro combo.  At this time, she denies diarrhea, constipation, or blood in the stools.  She also denies fever and fatigue.  She has no urinary symptoms.  Advised patient to return for today to be reevaluated, cbc, and possibly ct referral.  As far as knee is concerned, she continues to have instability and pain.  She was questioning whether she could take an oral prednisone, as that was the offering months ago, where she had a knee injection instead.  Referred her to ortho for consult at this time.

## 2014-06-03 ENCOUNTER — Telehealth: Payer: Self-pay | Admitting: Family Medicine

## 2014-06-03 ENCOUNTER — Telehealth: Payer: Self-pay | Admitting: Physician Assistant

## 2014-06-03 ENCOUNTER — Ambulatory Visit (HOSPITAL_COMMUNITY)
Admission: RE | Admit: 2014-06-03 | Discharge: 2014-06-03 | Disposition: A | Discharge status: Home / Self Care | Payer: BC Managed Care – PPO | Source: Ambulatory Visit | Attending: Family Medicine | Admitting: Family Medicine

## 2014-06-03 DIAGNOSIS — Z90710 Acquired absence of both cervix and uterus: Secondary | ICD-10-CM | POA: Insufficient documentation

## 2014-06-03 DIAGNOSIS — R1900 Intra-abdominal and pelvic swelling, mass and lump, unspecified site: Secondary | ICD-10-CM | POA: Insufficient documentation

## 2014-06-03 DIAGNOSIS — K5732 Diverticulitis of large intestine without perforation or abscess without bleeding: Secondary | ICD-10-CM

## 2014-06-03 DIAGNOSIS — K5730 Diverticulosis of large intestine without perforation or abscess without bleeding: Secondary | ICD-10-CM | POA: Insufficient documentation

## 2014-06-03 DIAGNOSIS — R10819 Abdominal tenderness, unspecified site: Secondary | ICD-10-CM | POA: Diagnosis present

## 2014-06-03 DIAGNOSIS — Z8719 Personal history of other diseases of the digestive system: Secondary | ICD-10-CM | POA: Diagnosis not present

## 2014-06-03 DIAGNOSIS — G8929 Other chronic pain: Secondary | ICD-10-CM

## 2014-06-03 DIAGNOSIS — M25562 Pain in left knee: Secondary | ICD-10-CM

## 2014-06-03 DIAGNOSIS — R350 Frequency of micturition: Secondary | ICD-10-CM

## 2014-06-03 DIAGNOSIS — K769 Liver disease, unspecified: Secondary | ICD-10-CM | POA: Diagnosis not present

## 2014-06-03 MED ORDER — IOHEXOL 300 MG/ML  SOLN
100.0000 mL | Freq: Once | INTRAMUSCULAR | Status: AC | PRN
  Administered 2014-06-03: 100 mL via INTRAVENOUS

## 2014-06-03 NOTE — Telephone Encounter (Signed)
Angelica Sharp is on the phone with pt now.

## 2014-06-03 NOTE — Telephone Encounter (Signed)
Patient called to receive her CT scan results. Patient stated depending on the results she may need a antibiotic. Please call patient at [phone].

## 2014-06-17 ENCOUNTER — Telehealth: Payer: Self-pay

## 2014-06-17 NOTE — Telephone Encounter (Signed)
LMOM to CB. Does not appear that pt received cortisone injection here. Please get more details.

## 2014-06-17 NOTE — Telephone Encounter (Signed)
Pt received a joint injection in her knee yesterday at [facility] and her BS this morning was 168. It normally runs 95-100 in the a.m with just her Metformin 2 tabs BID. Please advise. Thanks

## 2014-06-17 NOTE — Telephone Encounter (Signed)
I would have the patient continue to monitor her BS but as long as it stays under 250 we would not put her on insulin.  She is on the max dose of metformin so we do not want to increase that.

## 2014-06-17 NOTE — Telephone Encounter (Signed)
Pt states she received a cortisone shot and wants to know if she should be taking more of her diabetes medication until the cortisone wears off. Please advise. CB # [phone]

## 2014-06-17 NOTE — Telephone Encounter (Signed)
Pt informed

## 2014-07-16 ENCOUNTER — Ambulatory Visit: Payer: Self-pay | Admitting: Family Medicine

## 2014-07-23 ENCOUNTER — Encounter: Payer: Self-pay | Admitting: Family Medicine

## 2014-07-23 ENCOUNTER — Ambulatory Visit (INDEPENDENT_AMBULATORY_CARE_PROVIDER_SITE_OTHER): Payer: BLUE CROSS/BLUE SHIELD | Admitting: Family Medicine

## 2014-07-23 VITALS — BP 130/85 | HR 84 | Temp 98.4°F | Resp 16 | Ht 67.5 in | Wt 209.0 lb

## 2014-07-23 DIAGNOSIS — E1169 Type 2 diabetes mellitus with other specified complication: Secondary | ICD-10-CM

## 2014-07-23 DIAGNOSIS — Z23 Encounter for immunization: Secondary | ICD-10-CM

## 2014-07-23 DIAGNOSIS — E785 Hyperlipidemia, unspecified: Secondary | ICD-10-CM

## 2014-07-23 DIAGNOSIS — E119 Type 2 diabetes mellitus without complications: Secondary | ICD-10-CM

## 2014-07-23 LAB — LIPID PANEL
CHOL/HDL RATIO: 5.6 ratio
CHOLESTEROL: 224 mg/dL — AB (ref 0–200)
HDL: 40 mg/dL (ref >39)
LDL CALC: 120 mg/dL — AB (ref 0–99)
TRIGLYCERIDES: 320 mg/dL — AB (ref <150)
VLDL: 64 mg/dL — ABNORMAL HIGH (ref 0–40)

## 2014-07-23 LAB — BASIC METABOLIC PANEL
BUN: 10 mg/dL (ref 6–23)
CHLORIDE: 101 meq/L (ref 96–112)
CO2: 27 meq/L (ref 19–32)
CREATININE: 0.91 mg/dL (ref 0.50–1.10)
Calcium: 9.1 mg/dL (ref 8.4–10.5)
Glucose, Bld: 95 mg/dL (ref 70–99)
Potassium: 3.9 mEq/L (ref 3.5–5.3)
Sodium: 139 mEq/L (ref 135–145)

## 2014-07-23 LAB — POCT GLYCOSYLATED HEMOGLOBIN (HGB A1C): Hemoglobin A1C: 5.9

## 2014-07-23 MED ORDER — ALPRAZOLAM 0.25 MG PO TABS: ORAL_TABLET | ORAL | Status: DC

## 2014-07-23 NOTE — Progress Notes (Signed)
S:  This 49 y.o. Female has controlled Type II DM (last A1c= 6.6%) and lipid disorder; FSBS every other day = 90-120. She denies hypoglycemia, diaphoresis, fatigue, decreased appetite, CP or tightness,  palpitations, SOB or DOE, cough, HA, dizziness, weakness, numbness or syncope.  Patient Active Problem List   Diagnosis Date Noted  . Obesity 03/12/2014  . 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 Other and unspecified hyperlipidemia 09/10/2013  . Impaired glucose metabolism 09/10/2013  . Inconclusive mammogram 08/31/2013  . Family history of cancer 08/30/2012  . GERD (gastroesophageal reflux disease) 07/03/2011  . COUGH 04/18/2010  . EDEMA 01/24/2010  . CHEST PAIN 01/24/2010    Prior to Admission medications   Medication Sig Start Date End Date Taking? Authorizing Provider  ALPRAZolam Duanne Moron) 0.25 MG tablet TAKE 1 TABLET EVERY DAY   Yes Barton Fanny, MD  aspirin 81 MG tablet Take 81 mg by mouth daily.   Yes Historical Provider, MD  calcium-vitamin D (OSCAL WITH D) 500-200 MG-UNIT per tablet Take 1 tablet by mouth.   Yes Historical Provider, MD  Cholecalciferol (VITAMIN D PO) Take 1 capsule by mouth daily.   Yes Historical Provider, MD  docusate sodium (COLACE) 100 MG capsule Take 200 mg by mouth at bedtime. For stool softner   Yes Historical Provider, MD  esomeprazole (NEXIUM) 40 MG capsule TAKE 1 CAPSULE (40 MG TOTAL) BY MOUTH DAILY. 03/12/14  Yes Barton Fanny, MD  ferrous fumarate (HEMOCYTE - 106 MG FE) 325 (106 FE) MG TABS tablet Take 1 tablet by mouth.   Yes Historical Provider, MD  glucose blood test strip Use as instructed 03/15/14  Yes Chelle S Jeffery, PA-C  hydrochlorothiazide (HYDRODIURIL) 25 MG tablet TAKE 1 TABLET (25 MG TOTAL) BY MOUTH DAILY. 03/12/14  Yes Barton Fanny, MD  ibuprofen (ADVIL) 600 MG tablet Take 1 tablet (600 mg total) by mouth every 6 (six) hours as needed for pain. 09/19/12  Yes Megan Morris, DO  KRILL OIL PO Take by mouth.   Yes  Historical Provider, MD  metFORMIN (GLUCOPHAGE) 500 MG tablet Take one twice daily for one week, then 2 tabs twice daily 05/03/14  Yes Roselee Culver, MD  Multiple Vitamin (MULITIVITAMIN WITH MINERALS) TABS Take 1 tablet by mouth daily.   Yes Historical Provider, MD  naproxen sodium (ANAPROX DS) 550 MG tablet Take 1 tablet (550 mg total) by mouth 2 (two) times daily with a meal. 04/30/14 04/30/15 Yes Roselee Culver, MD  triamcinolone (NASACORT AQ) 55 MCG/ACT AERO nasal inhaler Place 2 sprays into the nose daily. Patient not taking: Reported on 07/23/2014 04/01/14   Araceli Bouche, PA    ROS: As per HPI.  O: Filed Vitals:   07/23/14 0825  BP: 130/85  Pulse: 84  Temp: 98.4 F (36.9 C)  Resp: 16   GEN: In NAD: WN,WD. HENT: [place]/AT. EOMI w/ clear conj/sclerae. Otherwise unremarkable. COR: RRR. LUNGS: Unlabored resp. SKIN: W&D; intact w/o diaphoresis or jaundice. NEURO: A&O x 3; CNs intact. Nonfocal.   Results for orders placed or performed in visit on 07/23/14  POCT glycosylated hemoglobin (Hb A1C)  Result Value Ref Range   Hemoglobin A1C 5.9     A/P: Diabetes mellitus type II, controlled - Improved. Continue current medication and lifestyle modifications. Plan: Basic metabolic panel, POCT glycosylated hemoglobin (Hb A1C)  Dyslipidemia associated with type 2 diabetes mellitus - Plan: Lipid panel  Need for prophylactic vaccination against Streptococcus pneumoniae (pneumococcus) - Plan: Pneumococcal conjugate vaccine 13-valent IM

## 2014-07-27 ENCOUNTER — Telehealth: Payer: Self-pay | Admitting: Family Medicine

## 2014-07-27 NOTE — Telephone Encounter (Signed)
Reviewed lipid panel results w/ pt; she is taking Metformin and has recently made some nutrition modifications. She is not able to exercise due to recent bone fracture; just now ut of "boot". She takes krill oil once a day. i have advised her to continue lifestyle changes and increase activity as directed by specialist. Increase krill oil to bid. Will recheck values in April 2016.

## 2014-07-27 NOTE — Progress Notes (Signed)
Quick Note:  Please advise pt regarding following labs...  I discussed results with patient on the phone on 07/27/2014.  Mail copy to pt. ______

## 2014-08-24 LAB — HM DIABETES EYE EXAM

## 2014-08-25 LAB — HM DIABETES EYE EXAM

## 2014-08-30 ENCOUNTER — Ambulatory Visit (INDEPENDENT_AMBULATORY_CARE_PROVIDER_SITE_OTHER): Payer: BLUE CROSS/BLUE SHIELD

## 2014-08-30 ENCOUNTER — Ambulatory Visit (INDEPENDENT_AMBULATORY_CARE_PROVIDER_SITE_OTHER): Payer: BLUE CROSS/BLUE SHIELD | Admitting: Family Medicine

## 2014-08-30 VITALS — BP 120/86 | HR 83 | Temp 97.9°F | Resp 18 | Ht 67.5 in | Wt 212.2 lb

## 2014-08-30 DIAGNOSIS — R05 Cough: Secondary | ICD-10-CM

## 2014-08-30 DIAGNOSIS — R0602 Shortness of breath: Secondary | ICD-10-CM

## 2014-08-30 DIAGNOSIS — A499 Bacterial infection, unspecified: Secondary | ICD-10-CM

## 2014-08-30 DIAGNOSIS — R059 Cough, unspecified: Secondary | ICD-10-CM

## 2014-08-30 DIAGNOSIS — R300 Dysuria: Secondary | ICD-10-CM

## 2014-08-30 DIAGNOSIS — N390 Urinary tract infection, site not specified: Secondary | ICD-10-CM

## 2014-08-30 DIAGNOSIS — J069 Acute upper respiratory infection, unspecified: Secondary | ICD-10-CM

## 2014-08-30 LAB — POCT URINALYSIS DIPSTICK
BILIRUBIN UA: NEGATIVE
GLUCOSE UA: NEGATIVE
KETONES UA: NEGATIVE
NITRITE UA: NEGATIVE
Protein, UA: NEGATIVE
Spec Grav, UA: <=1.005
Urobilinogen, UA: 0.2
pH, UA: 6

## 2014-08-30 LAB — POCT UA - MICROSCOPIC ONLY
CASTS, UR, LPF, POC: NEGATIVE
Crystals, Ur, HPF, POC: NEGATIVE
MUCUS UA: NEGATIVE
Yeast, UA: NEGATIVE

## 2014-08-30 MED ORDER — LEVOFLOXACIN 500 MG PO TABS: 500.0000 mg | ORAL_TABLET | Freq: Every day | ORAL | Status: DC

## 2014-08-30 MED ORDER — ALBUTEROL SULFATE HFA 108 (90 BASE) MCG/ACT IN AERS: 2.0000 | INHALATION_SPRAY | Freq: Four times a day (QID) | RESPIRATORY_TRACT | Status: DC | PRN

## 2014-08-30 MED ORDER — HYDROCODONE-HOMATROPINE 5-1.5 MG/5ML PO SYRP: 5.0000 mL | ORAL_SOLUTION | Freq: Every evening | ORAL | Status: DC | PRN

## 2014-08-30 MED ORDER — ALBUTEROL SULFATE (2.5 MG/3ML) 0.083% IN NEBU
2.5000 mg | INHALATION_SOLUTION | Freq: Once | RESPIRATORY_TRACT | Status: AC
  Administered 2014-08-30: 2.5 mg via RESPIRATORY_TRACT

## 2014-08-30 MED ORDER — IPRATROPIUM BROMIDE 0.02 % IN SOLN
0.5000 mg | Freq: Once | RESPIRATORY_TRACT | Status: AC
  Administered 2014-08-30: 0.5 mg via RESPIRATORY_TRACT

## 2014-08-30 MED ORDER — BENZONATATE 200 MG PO CAPS: 200.0000 mg | ORAL_CAPSULE | Freq: Three times a day (TID) | ORAL | Status: DC | PRN

## 2014-08-30 NOTE — Patient Instructions (Signed)
Urinary Tract Infection °Urinary tract infections (UTIs) can develop anywhere along your urinary tract. Your urinary tract is your body's drainage system for removing wastes and extra water. Your urinary tract includes two kidneys, two ureters, a bladder, and a urethra. Your kidneys are a pair of bean-shaped organs. Each kidney is about the size of your fist. They are located below your ribs, one on each side of your spine. °CAUSES °Infections are caused by microbes, which are microscopic organisms, including fungi, viruses, and bacteria. These organisms are so small that they can only be seen through a microscope. Bacteria are the microbes that most commonly cause UTIs. °SYMPTOMS  °Symptoms of UTIs may vary by age and gender of the patient and by the location of the infection. Symptoms in young women typically include a frequent and intense urge to urinate and a painful, burning feeling in the bladder or urethra during urination. Older women and men are more likely to be tired, shaky, and weak and have muscle aches and abdominal pain. A fever may mean the infection is in your kidneys. Other symptoms of a kidney infection include pain in your back or sides below the ribs, nausea, and vomiting. °DIAGNOSIS °To diagnose a UTI, your caregiver will ask you about your symptoms. Your caregiver also will ask to provide a urine sample. The urine sample will be tested for bacteria and white blood cells. White blood cells are made by your body to help fight infection. °TREATMENT  °Typically, UTIs can be treated with medication. Because most UTIs are caused by a bacterial infection, they usually can be treated with the use of antibiotics. The choice of antibiotic and length of treatment depend on your symptoms and the type of bacteria causing your infection. °HOME CARE INSTRUCTIONS °· If you were prescribed antibiotics, take them exactly as your caregiver instructs you. Finish the medication even if you feel better after you  have only taken some of the medication. °· Drink enough water and fluids to keep your urine clear or pale yellow. °· Avoid caffeine, tea, and carbonated beverages. They tend to irritate your bladder. °· Empty your bladder often. Avoid holding urine for long periods of time. °· Empty your bladder before and after sexual intercourse. °· After a bowel movement, women should cleanse from front to back. Use each tissue only once. °SEEK MEDICAL CARE IF:  °· You have back pain. °· You develop a fever. °· Your symptoms do not begin to resolve within 3 days. °SEEK IMMEDIATE MEDICAL CARE IF:  °· You have severe back pain or lower abdominal pain. °· You develop chills. °· You have nausea or vomiting. °· You have continued burning or discomfort with urination. °MAKE SURE YOU:  °· Understand these instructions. °· Will watch your condition. °· Will get help right away if you are not doing well or get worse. °Document Released: 03/07/2005 Document Revised: 11/27/2011 Document Reviewed: 07/06/2011 °ExitCare® Patient Information ©2015 [employer]. This information is not intended to replace advice given to you by your health care provider. Make sure you discuss any questions you have with your health care provider. °Acute Bronchitis °Bronchitis is inflammation of the airways that extend from the windpipe into the lungs (bronchi). The inflammation often causes mucus to develop. This leads to a cough, which is the most common symptom of bronchitis.  °In acute bronchitis, the condition usually develops suddenly and goes away over time, usually in a couple weeks. Smoking, allergies, and asthma can make bronchitis worse. Repeated episodes of   bronchitis may cause further lung problems.  °CAUSES °Acute bronchitis is most often caused by the same virus that causes a cold. The virus can spread from person to person (contagious) through coughing, sneezing, and touching contaminated objects. °SIGNS AND SYMPTOMS  °· Cough.   °· Fever.    °· Coughing up mucus.   °· Body aches.   °· Chest congestion.   °· Chills.   °· Shortness of breath.   °· Sore throat.   °DIAGNOSIS  °Acute bronchitis is usually diagnosed through a physical exam. Your health care provider will also ask you questions about your medical history. Tests, such as chest X-rays, are sometimes done to rule out other conditions.  °TREATMENT  °Acute bronchitis usually goes away in a couple weeks. Oftentimes, no medical treatment is necessary. Medicines are sometimes given for relief of fever or cough. Antibiotic medicines are usually not needed but may be prescribed in certain situations. In some cases, an inhaler may be recommended to help reduce shortness of breath and control the cough. A cool mist vaporizer may also be used to help thin bronchial secretions and make it easier to clear the chest.  °HOME CARE INSTRUCTIONS °· Get plenty of rest.   °· Drink enough fluids to keep your urine clear or pale yellow (unless you have a medical condition that requires fluid restriction). Increasing fluids may help thin your respiratory secretions (sputum) and reduce chest congestion, and it will prevent dehydration.   °· Take medicines only as directed by your health care provider. °· If you were prescribed an antibiotic medicine, finish it all even if you start to feel better. °· Avoid smoking and secondhand smoke. Exposure to cigarette smoke or irritating chemicals will make bronchitis worse. If you are a smoker, consider using nicotine gum or skin patches to help control withdrawal symptoms. Quitting smoking will help your lungs heal faster.   °· Reduce the chances of another bout of acute bronchitis by washing your hands frequently, avoiding people with cold symptoms, and trying not to touch your hands to your mouth, nose, or eyes.   °· Keep all follow-up visits as directed by your health care provider.   °SEEK MEDICAL CARE IF: °Your symptoms do not improve after 1 week of treatment.  °SEEK  IMMEDIATE MEDICAL CARE IF: °· You develop an increased fever or chills.   °· You have chest pain.   °· You have severe shortness of breath. °· You have bloody sputum.   °· You develop dehydration. °· You faint or repeatedly feel like you are going to pass out. °· You develop repeated vomiting. °· You develop a severe headache. °MAKE SURE YOU:  °· Understand these instructions. °· Will watch your condition. °· Will get help right away if you are not doing well or get worse. °Document Released: 07/05/2004 Document Revised: 10/12/2013 Document Reviewed: 11/18/2012 °ExitCare® Patient Information ©2015 [employer]. This information is not intended to replace advice given to you by your health care provider. Make sure you discuss any questions you have with your health care provider. ° °

## 2014-08-30 NOTE — Progress Notes (Signed)
Chief Complaint:  Chief Complaint  Patient presents with  . Urinary Tract Infection    low back pain - burning urination x yesterday  . Cough    chest congestion; tight; little SOB x 1 week    HPI: Angelica Sharp is a 49 y.o. female who is here for   1. One day history of low back pain, burning with urination, increased urgency. She denies any fevers or chills or nausea vomiting abdominal pain. She she has not tried anything for this 2. She has had a one-week history of cough, shortness of breath with some minimal exertion, has tried [employer] without results. She has taken Claritin as well for allergy symptoms but has not improved. Did have chills one week ago. Any asthma. When she is talking or nighttime.  Past Medical History  Diagnosis Date  . Chest pain   . SOB (shortness of breath)   . Edema     lower extremity  . Borderline hypertension   . Arthritis   . Family history of anesthesia complication     sister had BP complications during anes.  . Hypertension   . Degenerative lumbar disc     L5S1  . GERD (gastroesophageal reflux disease)   . H/O hiatal hernia   . Diverticulitis    Past Surgical History  Procedure Laterality Date  . Tubal ligation    . Breast lumpectomy      3 lumps -- two from right -- one from left  . Laparoscopic assisted vaginal hysterectomy N/A 09/18/2012    Procedure: LAPAROSCOPIC ASSISTED VAGINAL HYSTERECTOMY;  Surgeon: Linda Hedges, DO;  Location: [facility];  Service: Gynecology;  Laterality: N/A;  BLADDER  DISTENTION   History   Social History  . Marital Status: Divorced    Spouse Name: N/A  . Number of Children: N/A  . Years of Education: N/A   Occupational History  . Mortgage    Social History Main Topics  . Smoking status: Never Smoker   . Smokeless tobacco: Not on file  . Alcohol Use: No  . Drug Use: No  . Sexual Activity: Not on file   Other Topics Concern  . None   Social History Narrative   Family History    Problem Relation Age of Onset  . Heart attack Mother   . Diabetes Mother   . Heart attack Sister   . Pulmonary embolism Father   . Diabetes Sister   . Kidney disease Sister     kidney failure   Allergies  Allergen Reactions  . Morphine And Related     Headache and hallucinations  . Septra [Sulfamethoxazole-Trimethoprim]     Increased heartrate   Prior to Admission medications   Medication Sig Start Date End Date Taking? Authorizing Provider  ALPRAZolam Duanne Moron) 0.25 MG tablet TAKE 1 TABLET EVERY DAY 07/23/14  Yes Barton Fanny, MD  aspirin 81 MG tablet Take 81 mg by mouth daily.   Yes Historical Provider, MD  calcium-vitamin D (OSCAL WITH D) 500-200 MG-UNIT per tablet Take 1 tablet by mouth.   Yes Historical Provider, MD  Cholecalciferol (VITAMIN D PO) Take 1 capsule by mouth daily.   Yes Historical Provider, MD  docusate sodium (COLACE) 100 MG capsule Take 200 mg by mouth at bedtime. For stool softner   Yes Historical Provider, MD  esomeprazole (NEXIUM) 40 MG capsule TAKE 1 CAPSULE (40 MG TOTAL) BY MOUTH DAILY. 03/12/14  Yes Barton Fanny, MD  glucose blood  test strip Use as instructed 03/15/14  Yes Chelle S Jeffery, PA-C  hydrochlorothiazide (HYDRODIURIL) 25 MG tablet TAKE 1 TABLET (25 MG TOTAL) BY MOUTH DAILY. 03/12/14  Yes Barton Fanny, MD  ibuprofen (ADVIL) 600 MG tablet Take 1 tablet (600 mg total) by mouth every 6 (six) hours as needed for pain. 09/19/12  Yes Megan Morris, DO  KRILL OIL PO Take by mouth.   Yes Historical Provider, MD  metFORMIN (GLUCOPHAGE) 500 MG tablet Take one twice daily for one week, then 2 tabs twice daily 05/03/14  Yes Roselee Culver, MD  Multiple Vitamin (MULITIVITAMIN WITH MINERALS) TABS Take 1 tablet by mouth daily.   Yes Historical Provider, MD     ROS: The patient denies fevers, chills, night sweats, unintentional weight loss, chest pain, palpitations, wheezes, nausea, vomiting, abdominal pain, hematuria, melena, numbness,  weakness, or tingling.   All other systems have been reviewed and were otherwise negative with the exception of those mentioned in the HPI and as above.    PHYSICAL EXAM: Filed Vitals:   08/30/14 0914  BP: 120/86  Pulse: 83  Temp: 97.9 F (36.6 C)  Resp: 18   Filed Vitals:   08/30/14 0914  Height: 5' 7.5" (1.715 m)  Weight: 212 lb 3.2 oz (96.253 kg)   Body mass index is 32.73 kg/(m^2).  General: Alert, no acute distress HEENT:  Normocephalic, atraumatic, oropharynx patent. EOMI, PERRLA Erythematous throat, no exudates, TM normal, +/- sinus tenderness, + erythematous/boggy nasal mucosa Cardiovascular:  Regular rate and rhythm, no rubs murmurs or gallops.  No Carotid bruits, radial pulse intact. No pedal edema.  Respiratory: Clear to auscultation bilaterally.  No wheezes, rales, or rhonchi.  No cyanosis, no use of accessory musculature GI: No organomegaly, abdomen is soft and non-tender, positive bowel sounds.  No masses. Skin: No rashes. Neurologic: Facial musculature symmetric. Psychiatric: Patient is appropriate throughout our interaction. Lymphatic: No cervical lymphadenopathy Musculoskeletal: Gait intact. No cva tenderness   LABS: Results for orders placed or performed in visit on 08/30/14  Urine culture  Result Value Ref Range   Culture KLEBSIELLA PNEUMONIAE    Colony Count >=100,000 COLONIES/ML    Organism ID, Bacteria KLEBSIELLA PNEUMONIAE       Susceptibility   Klebsiella pneumoniae -  (no method available)    AMPICILLIN  Resistant     AMOX/CLAVULANIC <=2 Sensitive     AMPICILLIN/SULBACTAM 4 Sensitive     PIP/TAZO <=4 Sensitive     IMIPENEM <=0.25 Sensitive     CEFAZOLIN <=4 Sensitive     CEFTRIAXONE <=1 Sensitive     CEFTAZIDIME <=1 Sensitive     CEFEPIME <=1 Sensitive     GENTAMICIN <=1 Sensitive     TOBRAMYCIN <=1 Sensitive     CIPROFLOXACIN <=0.25 Sensitive     LEVOFLOXACIN <=0.12 Sensitive     NITROFURANTOIN 64 Intermediate     TRIMETH/SULFA <=20  Sensitive   POCT UA - Microscopic Only  Result Value Ref Range   WBC, Ur, HPF, POC 12-18    RBC, urine, microscopic 0-2    Bacteria, U Microscopic 3+    Mucus, UA neg    Epithelial cells, urine per micros 0-2    Crystals, Ur, HPF, POC neg    Casts, Ur, LPF, POC neg    Yeast, UA neg   POCT urinalysis dipstick  Result Value Ref Range   Color, UA yellow    Clarity, UA hazy    Glucose, UA neg    Bilirubin, UA neg  Ketones, UA neg    Spec Grav, UA <=1.005    Blood, UA tr-lysed    pH, UA 6.0    Protein, UA neg    Urobilinogen, UA 0.2    Nitrite, UA neg    Leukocytes, UA small (1+)      EKG/XRAY:   Primary read interpreted by Dr. Marin Comment at [facility]. negative   ASSESSMENT/PLAN: Encounter Diagnoses  Name Primary?  . Burning with urination   . SOB (shortness of breath)   . Cough   . UTI (urinary tract infection), bacterial Yes  . Acute upper respiratory infection    Rx Levaquin for UTI and also acute upper respiratory infection Prescribed Tessalon Perles, Hycodan for cough. Urine culture pending. Follow-up as needed.   Gross sideeffects, risk and benefits, and alternatives of medications d/w patient. Patient is aware that all medications have potential sideeffects and we are unable to predict every sideeffect or drug-drug interaction that may occur.  Zackery Brine, [place], DO 09/01/2014 2:05 PM

## 2014-09-01 LAB — URINE CULTURE: Colony Count: >=100000

## 2014-09-16 ENCOUNTER — Encounter: Payer: Self-pay | Admitting: *Deleted

## 2014-10-01 ENCOUNTER — Encounter: Payer: Self-pay | Admitting: Family Medicine

## 2014-10-01 ENCOUNTER — Ambulatory Visit (INDEPENDENT_AMBULATORY_CARE_PROVIDER_SITE_OTHER): Payer: BLUE CROSS/BLUE SHIELD | Admitting: Family Medicine

## 2014-10-01 VITALS — BP 120/78 | HR 73 | Temp 98.3°F | Resp 16 | Ht 68.0 in | Wt 212.0 lb

## 2014-10-01 DIAGNOSIS — J0100 Acute maxillary sinusitis, unspecified: Secondary | ICD-10-CM

## 2014-10-01 DIAGNOSIS — Z79899 Other long term (current) drug therapy: Secondary | ICD-10-CM | POA: Diagnosis not present

## 2014-10-01 DIAGNOSIS — Z87440 Personal history of urinary (tract) infections: Secondary | ICD-10-CM

## 2014-10-01 DIAGNOSIS — R5383 Other fatigue: Secondary | ICD-10-CM | POA: Diagnosis not present

## 2014-10-01 DIAGNOSIS — E119 Type 2 diabetes mellitus without complications: Secondary | ICD-10-CM

## 2014-10-01 DIAGNOSIS — M546 Pain in thoracic spine: Secondary | ICD-10-CM

## 2014-10-01 DIAGNOSIS — Z0000 Encounter for general adult medical examination without abnormal findings: Secondary | ICD-10-CM

## 2014-10-01 DIAGNOSIS — M2550 Pain in unspecified joint: Secondary | ICD-10-CM | POA: Diagnosis not present

## 2014-10-01 DIAGNOSIS — E785 Hyperlipidemia, unspecified: Secondary | ICD-10-CM | POA: Diagnosis not present

## 2014-10-01 DIAGNOSIS — G8929 Other chronic pain: Secondary | ICD-10-CM

## 2014-10-01 DIAGNOSIS — M549 Dorsalgia, unspecified: Secondary | ICD-10-CM

## 2014-10-01 LAB — CBC WITH DIFFERENTIAL/PLATELET
BASOS PCT: 0 % (ref 0–1)
Basophils Absolute: 0 10*3/uL (ref 0.0–0.1)
Eosinophils Absolute: 0.2 10*3/uL (ref 0.0–0.7)
Eosinophils Relative: 2 % (ref 0–5)
HCT: 35.3 % — ABNORMAL LOW (ref 36.0–46.0)
HEMOGLOBIN: 12.2 g/dL (ref 12.0–15.0)
Lymphocytes Relative: 22 % (ref 12–46)
Lymphs Abs: 1.9 10*3/uL (ref 0.7–4.0)
MCH: 27.1 pg (ref 26.0–34.0)
MCHC: 34.6 g/dL (ref 30.0–36.0)
MCV: 78.4 fL (ref 78.0–100.0)
MONO ABS: 0.3 10*3/uL (ref 0.1–1.0)
MPV: 9.3 fL (ref 8.6–12.4)
Monocytes Relative: 4 % (ref 3–12)
NEUTROS ABS: 6.2 10*3/uL (ref 1.7–7.7)
NEUTROS PCT: 72 % (ref 43–77)
Platelets: 321 10*3/uL (ref 150–400)
RBC: 4.5 MIL/uL (ref 3.87–5.11)
RDW: 15.6 % — ABNORMAL HIGH (ref 11.5–15.5)
WBC: 8.6 10*3/uL (ref 4.0–10.5)

## 2014-10-01 LAB — POCT URINALYSIS DIPSTICK
BILIRUBIN UA: NEGATIVE
Glucose, UA: NEGATIVE
Ketones, UA: NEGATIVE
Leukocytes, UA: NEGATIVE
Nitrite, UA: NEGATIVE
PH UA: 7
Protein, UA: NEGATIVE
RBC UA: NEGATIVE
SPEC GRAV UA: 1.015
Urobilinogen, UA: 0.2

## 2014-10-01 LAB — COMPLETE METABOLIC PANEL WITH GFR
ALBUMIN: 4.1 g/dL (ref 3.5–5.2)
ALT: 25 U/L (ref 0–35)
AST: 21 U/L (ref 0–37)
Alkaline Phosphatase: 72 U/L (ref 39–117)
BILIRUBIN TOTAL: 0.4 mg/dL (ref 0.2–1.2)
BUN: 15 mg/dL (ref 6–23)
CO2: 23 mEq/L (ref 19–32)
Calcium: 9.2 mg/dL (ref 8.4–10.5)
Chloride: 101 mEq/L (ref 96–112)
Creat: 0.83 mg/dL (ref 0.50–1.10)
GFR, Est African American: >89 mL/min
GFR, Est Non African American: 82 mL/min
Glucose, Bld: 78 mg/dL (ref 70–99)
POTASSIUM: 3.9 meq/L (ref 3.5–5.3)
SODIUM: 134 meq/L — AB (ref 135–145)
TOTAL PROTEIN: 7.1 g/dL (ref 6.0–8.3)

## 2014-10-01 LAB — LIPID PANEL
Cholesterol: 224 mg/dL — ABNORMAL HIGH (ref 0–200)
HDL: 37 mg/dL — ABNORMAL LOW (ref >=46)
LDL CALC: 148 mg/dL — AB (ref 0–99)
Total CHOL/HDL Ratio: 6.1 Ratio
Triglycerides: 193 mg/dL — ABNORMAL HIGH (ref <150)
VLDL: 39 mg/dL (ref 0–40)

## 2014-10-01 LAB — MAGNESIUM: MAGNESIUM: 1.8 mg/dL (ref 1.5–2.5)

## 2014-10-01 MED ORDER — ALPRAZOLAM 0.25 MG PO TABS: ORAL_TABLET | ORAL | Status: DC

## 2014-10-01 MED ORDER — DICLOFENAC SODIUM 75 MG PO TBEC: 75.0000 mg | DELAYED_RELEASE_TABLET | Freq: Two times a day (BID) | ORAL | Status: DC

## 2014-10-01 MED ORDER — METFORMIN HCL 500 MG PO TABS: ORAL_TABLET | ORAL | Status: DC

## 2014-10-01 MED ORDER — CEFUROXIME AXETIL 250 MG PO TABS: 250.0000 mg | ORAL_TABLET | Freq: Two times a day (BID) | ORAL | Status: DC

## 2014-10-01 NOTE — Progress Notes (Signed)
Subjective:    Patient ID: Angelica Sharp, female    DOB: 1965/06/13, 49 y.o.   MRN: [id number]  HPI  This 49 y.o female is here for CPE/labs; chronic medical issues include Type II DM, lipid disorder and recurrent chest pain. Pt c/o sinus symptoms related to seasonal allergies. She has GERD and occasional NP cough, sometimes worse at night.   HCM: MMG- 2015 (indeterminate L breast oval mass; pt to follow-up as per [facility] recommendation)           PAP- S/P TAH.           CRS- Advised.           IMM- Current.           Vision- Current.           Dental- Current.  Patient Active Problem List   Diagnosis Date Noted  . DM II (diabetes mellitus, type II), controlled 10/01/2014  . Obesity 03/12/2014  . 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 Other and unspecified hyperlipidemia 09/10/2013  . Impaired glucose metabolism 09/10/2013  . Inconclusive mammogram 08/31/2013  . Family history of cancer 08/30/2012  . GERD (gastroesophageal reflux disease) 07/03/2011  . COUGH 04/18/2010  . EDEMA 01/24/2010  . CHEST PAIN 01/24/2010    Prior to Admission medications   Medication Sig Start Date End Date Taking? Authorizing Provider  albuterol (PROVENTIL HFA;VENTOLIN HFA) 108 (90 BASE) MCG/ACT inhaler Inhale 2 puffs into the lungs every 6 (six) hours as needed for wheezing or shortness of breath. 08/30/14   Thao P Le, DO  ALPRAZolam (XANAX) 0.25 MG tablet Take 1 tablet daily by mouth prn anxiety.    Barton Fanny, MD  aspirin 81 MG tablet Take 81 mg by mouth daily.    Historical Provider, MD  calcium-vitamin D (OSCAL WITH D) 500-200 MG-UNIT per tablet Take 1 tablet by mouth.    Historical Provider, MD  Cholecalciferol (VITAMIN D PO) Take 1 capsule by mouth daily.    Historical Provider, MD  docusate sodium (COLACE) 100 MG capsule Take 200 mg by mouth at bedtime. For stool softner    Historical Provider, MD  esomeprazole (NEXIUM) 40 MG capsule TAKE 1 CAPSULE (40 MG TOTAL) BY MOUTH DAILY.  03/12/14   Barton Fanny, MD  glucose blood test strip Use as instructed 03/15/14   Chelle S Jeffery, PA-C  hydrochlorothiazide (HYDRODIURIL) 25 MG tablet TAKE 1 TABLET (25 MG TOTAL) BY MOUTH DAILY. 03/12/14   Barton Fanny, MD  KRILL OIL PO Take by mouth.    Historical Provider, MD  metFORMIN (GLUCOPHAGE) 500 MG tablet Take two tablets twice a day with meals.    Barton Fanny, MD  Multiple Vitamin (MULITIVITAMIN WITH MINERALS) TABS Take 1 tablet by mouth daily.    Historical Provider, MD    Past Surgical History  Procedure Laterality Date  . Tubal ligation    . Breast lumpectomy      3 lumps -- two from right -- one from left  . Laparoscopic assisted vaginal hysterectomy N/A 09/18/2012    Procedure: LAPAROSCOPIC ASSISTED VAGINAL HYSTERECTOMY;  Surgeon: Linda Hedges, DO;  Location: [facility];  Service: Gynecology;  Laterality: N/A;  BLADDER  DISTENTION  . Abdominal hysterectomy      History   Social History  . Marital Status: Divorced    Spouse Name: N/A  . Number of Children: N/A  . Years of Education: N/A   Occupational History  . Mortgage  Social History Main Topics  . Smoking status: Never Smoker   . Smokeless tobacco: Not on file  . Alcohol Use: No  . Drug Use: No  . Sexual Activity: No   Other Topics Concern  . Not on file   Social History Narrative    Family History  Problem Relation Age of Onset  . Heart attack Mother   . Diabetes Mother   . Hyperlipidemia Mother   . Hypertension Mother   . Heart attack Sister   . Pulmonary embolism Father   . Clotting disorder Father   . Diabetes Sister   . Kidney disease Sister     kidney failure  . Heart disease Sister   . Cancer Sister     Review of Systems  Constitutional: Positive for fatigue.  HENT: Positive for sinus pressure.   Respiratory: Positive for cough.   Cardiovascular: Positive for leg swelling.  Musculoskeletal: Positive for myalgias, back pain, arthralgias and neck stiffness.        Chronic back pain associated w/ DDD.  Neurological: Positive for headaches.      Objective:   Physical Exam  Constitutional: She is oriented to person, place, and time. Vital signs are normal. She appears well-developed and well-nourished. No distress.  Blood pressure 120/78, pulse 73, temperature 98.3 F (36.8 C), resp. rate 16, height 5\' 8"  (1.727 m), weight 212 lb (96.163 kg), last menstrual period 08/10/2012, SpO2 97 %.   HENT:  Head: Normocephalic and atraumatic.  Right Ear: Hearing, tympanic membrane, external ear and ear canal normal.  Left Ear: Hearing, tympanic membrane, external ear and ear canal normal.  Nose: Mucosal edema present. No nasal deformity or septal deviation. Right sinus exhibits no maxillary sinus tenderness and no frontal sinus tenderness. Left sinus exhibits no maxillary sinus tenderness and no frontal sinus tenderness.  Mouth/Throat: Uvula is midline and mucous membranes are normal. No oral lesions. Normal dentition. No uvula swelling. Posterior oropharyngeal erythema present. No oropharyngeal exudate.  Eyes: Conjunctivae, EOM and lids are normal. Pupils are equal, round, and reactive to light. No scleral icterus.  Neck: Trachea normal, normal range of motion, full passive range of motion without pain and phonation normal. Neck supple. No spinous process tenderness and no muscular tenderness present. No thyroid mass and no thyromegaly present.  Cardiovascular: Normal rate, regular rhythm, S1 normal, S2 normal, normal heart sounds, intact distal pulses and normal pulses.   No extrasystoles are present. PMI is not displaced.  Exam reveals no gallop and no friction rub.   No murmur heard. Pulmonary/Chest: Effort normal and breath sounds normal. No respiratory distress. She has no decreased breath sounds. She has no wheezes. She has no rhonchi. Right breast exhibits no inverted nipple, no mass, no nipple discharge, no skin change and no tenderness. Left breast exhibits  no inverted nipple, no mass, no nipple discharge, no skin change and no tenderness. Breasts are symmetrical.  Abdominal: Soft. Normal appearance, normal aorta and bowel sounds are normal. She exhibits no distension and no mass. There is no hepatosplenomegaly. There is tenderness in the epigastric area. There is no guarding and no CVA tenderness.  Genitourinary:  Deferred.  Musculoskeletal:       Cervical back: Normal.       Thoracic back: She exhibits tenderness. She exhibits no bony tenderness, no deformity, no pain and no spasm.       Lumbar back: She exhibits tenderness, bony tenderness and spasm. She exhibits no edema, no deformity and no pain.  Remainder  of exam remarkable for minor joint discomfort w/o deformities, effusions or erythema or tenderness.  Lymphadenopathy:       Head (right side): No submental, no submandibular, no tonsillar, no preauricular, no posterior auricular and no occipital adenopathy present.       Head (left side): No submental, no submandibular, no tonsillar, no preauricular, no posterior auricular and no occipital adenopathy present.    She has no cervical adenopathy.    She has no axillary adenopathy.       Right: No supraclavicular adenopathy present.       Left: No supraclavicular adenopathy present.  Neurological: She is alert and oriented to person, place, and time. She has normal strength. She displays no atrophy. No cranial nerve deficit or sensory deficit. She exhibits normal muscle tone. She displays a negative Romberg sign. Coordination and gait normal.  Reflex Scores:      Tricep reflexes are 1+ on the right side and 1+ on the left side.      Bicep reflexes are 1+ on the right side and 1+ on the left side.      Brachioradialis reflexes are 1+ on the right side and 1+ on the left side.      Patellar reflexes are 1+ on the right side and 1+ on the left side. Skin: Skin is warm, dry and intact. No ecchymosis, no petechiae and no rash noted. She is not  diaphoretic. No cyanosis or erythema. No pallor. Nails show no clubbing.  Psychiatric: Her speech is normal and behavior is normal. Judgment and thought content normal. Her mood appears anxious. Her affect is not blunt, not labile and not inappropriate. Cognition and memory are normal. She does not exhibit a depressed mood.  Nursing note and vitals reviewed.   ECG: Normal sinus rhythm w/o ST-TW changes; compared to previous tracings >> no change.  No ectopy.     Assessment & Plan:  Annual physical exam  Upper back pain - Plan: EKG 12-Lead  DM II (diabetes mellitus, type II), controlled - Pt reports normal FSBS; will reduce metformin 500 mg to 1 tablet bid w/ meals. Continue lifestyle changes, weight loss and healthy nutrition. Plan: COMPLETE METABOLIC PANEL WITH GFR  Subacute maxillary sinusitis- RX: cefuroxime 250 mg 1 tablet bid x 7 days.  Hyperlipidemia with target LDL less than 100 - Plan: COMPLETE METABOLIC PANEL WITH GFR, Lipid panel  History of UTI - Plan: POCT urinalysis dipstick  Other fatigue - Plan: CBC with Differential/Platelet, Vitamin D, 25-hydroxy, Vitamin B12  Chronic joint pain- Trial Diclofenac 75 mg 1 tablet bid w/ food.  Encounter for long-term (current) use of medications (PPI) - Plan: Magnesium, Ferritin   Meds ordered this encounter  Medications  . ALPRAZolam (XANAX) 0.25 MG tablet    Sig: Take 1 tablet daily by mouth prn anxiety.    Dispense:  30 tablet    Refill:  1  . metFORMIN (GLUCOPHAGE) 500 MG tablet    Sig: Take one tablet twice a day with meals.    Dispense:  60 tablet    Refill:  5  . diclofenac (VOLTAREN) 75 MG EC tablet    Sig: Take 1 tablet (75 mg total) by mouth 2 (two) times daily.    Dispense:  60 tablet    Refill:  5  . cefUROXime (CEFTIN) 250 MG tablet    Sig: Take 1 tablet (250 mg total) by mouth 2 (two) times daily with a meal.    Dispense:  14 tablet  Refill:  0

## 2014-10-01 NOTE — Patient Instructions (Signed)
Keeping You Healthy  Get These Tests  Blood Pressure- Have your blood pressure checked by your healthcare provider at least once a year.  Normal blood pressure is 120/80.  Weight- Have your body mass index (BMI) calculated to screen for obesity.  BMI is a measure of body fat based on height and weight.  You can calculate your own BMI at www.nhlbisupport.com/bmi/  Cholesterol- Have your cholesterol checked every year.  Diabetes- Have your blood sugar checked every year if you have high blood pressure, high cholesterol, a family history of diabetes or if you are overweight.  Pap Smear- Have a pap smear every 1 to 3 years if you have been sexually active.  If you are older than 65 and recent pap smears have been normal you may not need additional pap smears.  In addition, if you have had a hysterectomy  For benign disease additional pap smears are not necessary.  Mammogram-Yearly mammograms are essential for early detection of breast cancer  Screening for Colon Cancer- Colonoscopy starting at age 50. Screening may begin sooner depending on your family history and other health conditions.  Follow up colonoscopy as directed by your Gastroenterologist.  Screening for Osteoporosis- Screening begins at age 65 with bone density scanning, sooner if you are at higher risk for developing Osteoporosis.  Get these medicines  Calcium with Vitamin D- Your body requires 1200-1500 mg of Calcium a day and 800-1000 IU of Vitamin D a day.  You can only absorb 500 mg of Calcium at a time therefore Calcium must be taken in 2 or 3 separate doses throughout the day.  Hormones- Hormone therapy has been associated with increased risk for certain cancers and heart disease.  Talk to your healthcare provider about if you need relief from menopausal symptoms.  Aspirin- Ask your healthcare provider about taking Aspirin to prevent Heart Disease and Stroke.  Get these Immuniztions  Flu shot- Every fall  Pneumonia  shot- Once after the age of 51; if you are younger ask your healthcare provider if you need a pneumonia shot.  Tetanus- Every ten years.  Zostavax- Once after the age of 51 to prevent shingles.  Take these steps  Don't smoke- Your healthcare provider can help you quit. For tips on how to quit, ask your healthcare provider or go to www.smokefree.gov or call 1-800 QUIT-NOW.  Be physically active- Exercise 5 days a week for a minimum of 30 minutes.  If you are not already physically active, start slow and gradually work up to 30 minutes of moderate physical activity.  Try walking, dancing, bike riding, swimming, etc.  Eat a healthy diet- Eat a variety of healthy foods such as fruits, vegetables, whole grains, low fat milk, low fat cheeses, yogurt, lean meats, chicken, fish, eggs, dried beans, tofu, etc.  For more information go to www.thenutritionsource.org  Dental visit- Brush and floss teeth twice daily; visit your dentist twice a year.  Eye exam- Visit your Optometrist or Ophthalmologist yearly.  Drink alcohol in moderation- Limit alcohol intake to one drink or less a day.  Never drink and drive.  Depression- Your emotional health is as important as your physical health.  If you're feeling down or losing interest in things you normally enjoy, please talk to your healthcare provider.  Seat Belts- can save your life; always wear one  Smoke/Carbon Monoxide detectors- These detectors need to be installed on the appropriate level of your home.  Replace batteries at least once a year.  Violence- If anyone   is threatening or hurting you, please tell your healthcare provider.  Living Will/ Health care power of attorney- Discuss with your healthcare provider and family.   Contact the clinic with the name of the GI specialist in [facility] that you want to be scheduled with to discuss colonoscopy.  Schedule your mammogram.   Dr. Rosita Kea has written a book about healthy eating and weight loss-  "Eat Fat, Get Thin". There are some very practical and beneficial tips and strategies for better nutrition and weight loss in his book. He also wrote a book years ago called "The Blood Sugar Solution".

## 2014-10-02 LAB — VITAMIN D 25 HYDROXY (VIT D DEFICIENCY, FRACTURES): VIT D 25 HYDROXY: 22 ng/mL — AB (ref 30–100)

## 2014-10-02 LAB — FERRITIN: FERRITIN: 14 ng/mL (ref 10–291)

## 2014-10-02 LAB — VITAMIN B12: Vitamin B-12: 245 pg/mL (ref 211–911)

## 2014-10-05 ENCOUNTER — Encounter: Payer: Self-pay | Admitting: Family Medicine

## 2014-10-08 ENCOUNTER — Telehealth: Payer: Self-pay | Admitting: *Deleted

## 2014-10-08 NOTE — Telephone Encounter (Signed)
Faxed signed order to [facility], per Dr Leward Quan. Confirmation page received at 9:22 am.

## 2014-10-11 ENCOUNTER — Telehealth: Payer: Self-pay

## 2014-10-11 NOTE — Telephone Encounter (Addendum)
Pt states she had lab work done by Dr.McPherson and have never heard about results. Also her Nexium have moved from tier 1 to tier 3 and we have to call the pharmacy so she can get the generic Please call pt at [phone]    [facility]

## 2014-10-11 NOTE — Telephone Encounter (Signed)
Pt locked out of MyChart. Notified about labs. Dr. Leward Quan, can we switch her PPI? Prilosec doesn't help. Is there another option or do we need to do a PA?

## 2014-10-12 MED ORDER — PANTOPRAZOLE SODIUM 40 MG PO TBEC: 40.0000 mg | DELAYED_RELEASE_TABLET | Freq: Every day | ORAL | Status: DC

## 2014-10-12 NOTE — Telephone Encounter (Signed)
I changed medication to Protonix 40 mg. She can try that for at least 3 weeks and notify clinic if that med is not effective.

## 2014-10-13 NOTE — Telephone Encounter (Signed)
Pt notified. Mailed pt copy of labs upon her request.

## 2014-10-25 ENCOUNTER — Ambulatory Visit (INDEPENDENT_AMBULATORY_CARE_PROVIDER_SITE_OTHER): Payer: BLUE CROSS/BLUE SHIELD | Admitting: Family Medicine

## 2014-10-25 ENCOUNTER — Encounter: Payer: Self-pay | Admitting: *Deleted

## 2014-10-25 VITALS — BP 126/80 | HR 84 | Temp 97.5°F | Resp 17 | Ht 67.5 in | Wt 213.0 lb

## 2014-10-25 DIAGNOSIS — M25462 Effusion, left knee: Secondary | ICD-10-CM | POA: Diagnosis not present

## 2014-10-25 DIAGNOSIS — R300 Dysuria: Secondary | ICD-10-CM

## 2014-10-25 LAB — POCT URINALYSIS DIPSTICK
BILIRUBIN UA: NEGATIVE
Glucose, UA: NEGATIVE
KETONES UA: NEGATIVE
Leukocytes, UA: NEGATIVE
Nitrite, UA: NEGATIVE
Protein: NEGATIVE
RBC UA: NEGATIVE
UROBILINOGEN UA: 0.2
pH, UA: 7

## 2014-10-25 LAB — POCT UA - MICROSCOPIC ONLY
CRYSTALS, UR, HPF, POC: NEGATIVE
Casts, Ur, LPF, POC: NEGATIVE
Mucus, UA: NEGATIVE
RBC, URINE, MICROSCOPIC: NEGATIVE
Yeast, UA: NEGATIVE

## 2014-10-25 MED ORDER — CIPROFLOXACIN HCL 250 MG PO TABS: 250.0000 mg | ORAL_TABLET | Freq: Two times a day (BID) | ORAL | Status: DC

## 2014-10-25 MED ORDER — METHYLPREDNISOLONE ACETATE 80 MG/ML IJ SUSP
40.0000 mg | Freq: Once | INTRAMUSCULAR | Status: AC
  Administered 2014-10-25: 40 mg via INTRA_ARTICULAR

## 2014-10-25 NOTE — Progress Notes (Signed)
Subjective:  This chart was scribed for Robyn Haber MD, by Tamsen Roers, at [facility].  This patient was seen in room 13 and the patient's care was started at 2:08 PM.    Patient ID: Angelica Sharp, female    DOB: 12-17-1965, 49 y.o.   MRN: [id number] Chief Complaint  Patient presents with   Dysuria   Knee Pain   HPI  HPI Comments: Angelica Sharp is a 49 y.o. female who presents to the [facility] complaining of dysuria onset a couple days ago.  Patient has associated symptoms of lower back pain and feels ill to her stomach intermittently. She denies any fever/chills.  She has a history of diabetes. Patient works with [employer].    Knee pain: Patient is also complaining of left knee pain and has an MRI done before. She feels like her knee locks and tends to give out on her at times.  She states that the MRI did not show any tear in her meniscus but states that she had little splints in it.  She received a shot to alleviate her symptoms at the time and states that it helped her.  She does not go to the gym.    Past Medical History  Diagnosis Date   Chest pain    SOB (shortness of breath)    Edema     lower extremity   Borderline hypertension    Arthritis    Family history of anesthesia complication     sister had BP complications during anes.   Hypertension    Degenerative lumbar disc     [id number]   GERD (gastroesophageal reflux disease)    H/O hiatal hernia    Diverticulitis    Diabetes mellitus without complication    Allergy    Anemia    Anxiety     Current Outpatient Prescriptions on File Prior to Visit  Medication Sig Dispense Refill   albuterol (PROVENTIL HFA;VENTOLIN HFA) 108 (90 BASE) MCG/ACT inhaler Inhale 2 puffs into the lungs every 6 (six) hours as needed for wheezing or shortness of breath. 1 Inhaler 2   ALPRAZolam (XANAX) 0.25 MG tablet Take 1 tablet daily by mouth prn anxiety. 30 tablet  1   aspirin 81 MG tablet Take 81 mg by mouth daily.     calcium-vitamin D (OSCAL WITH D) 500-200 MG-UNIT per tablet Take 1 tablet by mouth.     cefUROXime (CEFTIN) 250 MG tablet Take 1 tablet (250 mg total) by mouth 2 (two) times daily with a meal. 14 tablet 0   Cholecalciferol (VITAMIN D PO) Take 1 capsule by mouth daily.     diclofenac (VOLTAREN) 75 MG EC tablet Take 1 tablet (75 mg total) by mouth 2 (two) times daily. 60 tablet 5   docusate sodium (COLACE) 100 MG capsule Take 200 mg by mouth at bedtime. For stool softner     glucose blood test strip Use as instructed 50 each 5   hydrochlorothiazide (HYDRODIURIL) 25 MG tablet TAKE 1 TABLET (25 MG TOTAL) BY MOUTH DAILY. 30 tablet 11   KRILL OIL PO Take by mouth.     metFORMIN (GLUCOPHAGE) 500 MG tablet Take one tablet twice a day with meals. 60 tablet 5   Multiple Vitamin (MULITIVITAMIN WITH MINERALS) TABS Take 1 tablet by mouth daily.     pantoprazole (PROTONIX) 40 MG tablet Take 1 tablet (40 mg total) by mouth daily. 30 tablet 3   [DISCONTINUED] calcium carbonate  200 MG capsule TWICE WEEKLY     [DISCONTINUED] ranitidine (ZANTAC) 150 MG capsule Take 150 mg by mouth every evening.     No current facility-administered medications on file prior to visit.    Allergies  Allergen Reactions   Morphine And Related     Headache and hallucinations   Septra [Sulfamethoxazole-Trimethoprim]     Increased heartrate       Review of Systems  Constitutional: Negative for fever and chills.  Eyes: Negative for pain, discharge, redness and itching.  Respiratory: Negative for cough and shortness of breath.   Gastrointestinal: Positive for nausea.  Genitourinary: Positive for dysuria.  Musculoskeletal: Positive for back pain and joint swelling. Negative for neck pain and neck stiffness.       Objective:   Physical Exam  Constitutional: She appears well-developed and well-nourished. No distress.  HENT:  Head: Normocephalic and  atraumatic.  Eyes: Right eye exhibits no discharge. Left eye exhibits no discharge.  Pulmonary/Chest: Effort normal. No respiratory distress.  Musculoskeletal:  Left knee: small effusion, some tenderness laterally but good range of motion.   Neurological: She is alert. Coordination normal.  Skin: No rash noted. She is not diaphoretic.  Psychiatric: She has a normal mood and affect. Her behavior is normal.  Nursing note and vitals reviewed.  Filed Vitals:   10/25/14 1301  BP: 126/80  Pulse: 84  Temp: 97.5 F (36.4 C)  TempSrc: Oral  Resp: 17  Height: 5' 7.5" (1.715 m)  Weight: 213 lb (96.616 kg)  SpO2: 97%    Results for orders placed or performed in visit on 10/25/14  POCT UA - Microscopic Only  Result Value Ref Range   WBC, Ur, HPF, POC 0-2    RBC, urine, microscopic neg    Bacteria, U Microscopic small    Mucus, UA neg    Epithelial cells, urine per micros 0-4    Crystals, Ur, HPF, POC neg    Casts, Ur, LPF, POC neg    Yeast, UA neg   POCT urinalysis dipstick  Result Value Ref Range   Color, UA yellow    Clarity, UA clear    Glucose, UA neg    Bilirubin, UA neg    Ketones, UA neg    Spec Grav, UA <=1.005    Blood, UA neg    pH, UA 7.0    Protein neg    Urobilinogen, UA 0.2    Nitrite, UA neg    Leukocytes, UA Negative    Patient's left knee was prepped on the lateral aspect of the joint line. Using Betadine, the area was coated. Next upper chloride was sprayed after identifying the joint line, and then the R cane, Depo-Medrol combination was injected without complication.  No CVA tenderness, abdomen mildly tender in the lower suprapubic regions without mass, guarding or rebound    Assessment & Plan:   This chart was scribed in my presence and reviewed by me personally.    ICD-9-CM ICD-10-CM   1. Dysuria 788.1 R30.0 POCT UA - Microscopic Only     POCT urinalysis dipstick     ciprofloxacin (CIPRO) 250 MG tablet     Urine culture  2. Knee effusion, left  719.06 M25.462 methylPREDNISolone acetate (DEPO-MEDROL) injection 40 mg     Signed, Robyn Haber, MD

## 2014-10-25 NOTE — Patient Instructions (Signed)
Please let us know if the knee changes resolve or if the urinary symptoms worsen and do not resolve. Her expecting complete resolution of both in the next 2-3 days.   Urinary Tract Infection Urinary tract infections (UTIs) can develop anywhere along your urinary tract. Your urinary tract is your body's drainage system for removing wastes and extra water. Your urinary tract includes two kidneys, two ureters, a bladder, and a urethra. Your kidneys are a pair of bean-shaped organs. Each kidney is about the size of your fist. They are located below your ribs, one on each side of your spine. CAUSES Infections are caused by microbes, which are microscopic organisms, including fungi, viruses, and bacteria. These organisms are so small that they can only be seen through a microscope. Bacteria are the microbes that most commonly cause UTIs. SYMPTOMS  Symptoms of UTIs may vary by age and gender of the patient and by the location of the infection. Symptoms in young women typically include a frequent and intense urge to urinate and a painful, burning feeling in the bladder or urethra during urination. Older women and men are more likely to be tired, shaky, and weak and have muscle aches and abdominal pain. A fever may mean the infection is in your kidneys. Other symptoms of a kidney infection include pain in your back or sides below the ribs, nausea, and vomiting. DIAGNOSIS To diagnose a UTI, your caregiver will ask you about your symptoms. Your caregiver also will ask to provide a urine sample. The urine sample will be tested for bacteria and white blood cells. White blood cells are made by your body to help fight infection. TREATMENT  Typically, UTIs can be treated with medication. Because most UTIs are caused by a bacterial infection, they usually can be treated with the use of antibiotics. The choice of antibiotic and length of treatment depend on your symptoms and the type of bacteria causing your  infection. HOME CARE INSTRUCTIONS  If you were prescribed antibiotics, take them exactly as your caregiver instructs you. Finish the medication even if you feel better after you have only taken some of the medication.  Drink enough water and fluids to keep your urine clear or pale yellow.  Avoid caffeine, tea, and carbonated beverages. They tend to irritate your bladder.  Empty your bladder often. Avoid holding urine for long periods of time.  Empty your bladder before and after sexual intercourse.  After a bowel movement, women should cleanse from front to back. Use each tissue only once. SEEK MEDICAL CARE IF:   You have back pain.  You develop a fever.  Your symptoms do not begin to resolve within 3 days. SEEK IMMEDIATE MEDICAL CARE IF:   You have severe back pain or lower abdominal pain.  You develop chills.  You have nausea or vomiting.  You have continued burning or discomfort with urination. MAKE SURE YOU:   Understand these instructions.  Will watch your condition.  Will get help right away if you are not doing well or get worse. Document Released: 03/07/2005 Document Revised: 11/27/2011 Document Reviewed: 07/06/2011 [facility] Patient Information 2015 [place], [place]. This information is not intended to replace advice given to you by your health care provider. Make sure you discuss any questions you have with your health care provider.

## 2014-10-27 LAB — URINE CULTURE: Colony Count: >=100000

## 2014-11-03 ENCOUNTER — Telehealth: Payer: Self-pay

## 2014-11-03 NOTE — Telephone Encounter (Signed)
Pt states she was given a shot in her knee and it isn't any better, would like to know if we would call her in some PREDISONE. Please call [phone]    [facility] [place]

## 2014-11-04 ENCOUNTER — Other Ambulatory Visit: Payer: Self-pay | Admitting: Family Medicine

## 2014-11-04 DIAGNOSIS — M25569 Pain in unspecified knee: Secondary | ICD-10-CM

## 2014-11-04 MED ORDER — PREDNISONE 20 MG PO TABS: ORAL_TABLET | ORAL | Status: DC

## 2014-11-08 ENCOUNTER — Other Ambulatory Visit: Payer: Self-pay | Admitting: Emergency Medicine

## 2014-11-18 ENCOUNTER — Encounter: Payer: Self-pay | Admitting: Family Medicine

## 2014-12-24 ENCOUNTER — Ambulatory Visit (INDEPENDENT_AMBULATORY_CARE_PROVIDER_SITE_OTHER): Payer: BLUE CROSS/BLUE SHIELD | Admitting: Emergency Medicine

## 2014-12-24 VITALS — BP 120/90 | HR 93 | Temp 98.4°F | Resp 16 | Ht 67.5 in | Wt 210.0 lb

## 2014-12-24 DIAGNOSIS — K5732 Diverticulitis of large intestine without perforation or abscess without bleeding: Secondary | ICD-10-CM | POA: Diagnosis not present

## 2014-12-24 DIAGNOSIS — R1030 Lower abdominal pain, unspecified: Secondary | ICD-10-CM | POA: Diagnosis not present

## 2014-12-24 LAB — POCT CBC
Granulocyte percent: 77.3 %G (ref 37–80)
HEMATOCRIT: 37.9 % (ref 37.7–47.9)
Hemoglobin: 12.3 g/dL (ref 12.2–16.2)
LYMPH, POC: 2 (ref 0.6–3.4)
MCH, POC: 25.1 pg — AB (ref 27–31.2)
MCHC: 32.5 g/dL (ref 31.8–35.4)
MCV: 77.3 fL — AB (ref 80–97)
MID (cbc): 0.6 (ref 0–0.9)
MPV: 7.1 fL (ref 0–99.8)
PLATELET COUNT, POC: 359 10*3/uL (ref 142–424)
POC GRANULOCYTE: 8.9 — AB (ref 2–6.9)
POC LYMPH PERCENT: 17.1 %L (ref 10–50)
POC MID %: 5.6 % (ref 0–12)
RBC: 4.91 M/uL (ref 4.04–5.48)
RDW, POC: 15.3 %
WBC: 11.5 10*3/uL — AB (ref 4.6–10.2)

## 2014-12-24 LAB — POCT URINALYSIS DIPSTICK
Bilirubin, UA: NEGATIVE
Blood, UA: NEGATIVE
GLUCOSE UA: NEGATIVE
Ketones, UA: NEGATIVE
Nitrite, UA: NEGATIVE
PROTEIN UA: NEGATIVE
SPEC GRAV UA: 1.01
UROBILINOGEN UA: 0.2
pH, UA: 6.5

## 2014-12-24 LAB — POCT UA - MICROSCOPIC ONLY
BACTERIA, U MICROSCOPIC: NEGATIVE
CRYSTALS, UR, HPF, POC: NEGATIVE
Casts, Ur, LPF, POC: NEGATIVE
EPITHELIAL CELLS, URINE PER MICROSCOPY: NEGATIVE
Mucus, UA: NEGATIVE
RBC, urine, microscopic: NEGATIVE
WBC, Ur, HPF, POC: NEGATIVE
Yeast, UA: NEGATIVE

## 2014-12-24 MED ORDER — CIPROFLOXACIN HCL 500 MG PO TABS: 500.0000 mg | ORAL_TABLET | Freq: Two times a day (BID) | ORAL | Status: DC

## 2014-12-24 MED ORDER — ONDANSETRON 8 MG PO TBDP: 8.0000 mg | ORAL_TABLET | Freq: Three times a day (TID) | ORAL | Status: DC | PRN

## 2014-12-24 MED ORDER — METRONIDAZOLE 500 MG PO TABS: 500.0000 mg | ORAL_TABLET | Freq: Four times a day (QID) | ORAL | Status: DC

## 2014-12-24 NOTE — Patient Instructions (Signed)

## 2014-12-24 NOTE — Progress Notes (Signed)
Subjective:  Patient ID: Angelica Sharp, female    DOB: 12/26/1965  Age: 49 y.o. MRN: [id number]  CC: Abdominal Cramping and Back Pain   HPI Angelica Sharp presents  patient has a history of repeated episodes of diverticulitis for which she's been hospitalized once for. Been treated twice by our records for diverticulitis with a negative CAT scan. She's had an colonoscopy that was significant for diverticulosis but no diverticulitis. Both of her CAT scans that I can see in the record shows that she has diverticulosis but no diverticulitis. She has no fever chills. No nausea vomiting. No stool change. No blood in her stools or black stools. She been ill for 3 days. Pain is worse when she hits Angelica Sharp tracks her bottles. She has no dysuria urgency or frequency. She does have some back pain.  History Angelica Sharp has a past medical history of Chest pain; SOB (shortness of breath); Edema; Borderline hypertension; Arthritis; Family history of anesthesia complication; Hypertension; Degenerative lumbar disc; GERD (gastroesophageal reflux disease); H/O hiatal hernia; Diverticulitis; Diabetes mellitus without complication; Allergy; Anemia; and Anxiety.   She has past surgical history that includes Tubal ligation; Breast lumpectomy; Laparoscopic assisted vaginal hysterectomy (N/A, 09/18/2012); and Abdominal hysterectomy.   Her  family history includes Cancer in her sister; Clotting disorder in her father; Diabetes in her mother and sister; Heart attack in her mother and sister; Heart disease in her sister; Hyperlipidemia in her mother; Hypertension in her mother; Kidney disease in her sister; Pulmonary embolism in her father.  She   reports that she has never smoked. She does not have any smokeless tobacco history on file. She reports that she does not drink alcohol or use illicit drugs.  Outpatient Prescriptions Prior to Visit  Medication Sig Dispense Refill  . ALPRAZolam (XANAX) 0.25 MG tablet Take 1 tablet  daily by mouth prn anxiety. 30 tablet 1  . aspirin 81 MG tablet Take 81 mg by mouth daily.    . calcium-vitamin D (OSCAL WITH D) 500-200 MG-UNIT per tablet Take 1 tablet by mouth.    . Cholecalciferol (VITAMIN D PO) Take 1 capsule by mouth daily.    Marland Kitchen glucose blood test strip Use as instructed 50 each 5  . hydrochlorothiazide (HYDRODIURIL) 25 MG tablet TAKE 1 TABLET (25 MG TOTAL) BY MOUTH DAILY. 30 tablet 11  . KRILL OIL PO Take by mouth.    . metFORMIN (GLUCOPHAGE) 500 MG tablet Take one tablet twice a day with meals. 60 tablet 5  . pantoprazole (PROTONIX) 40 MG tablet Take 1 tablet (40 mg total) by mouth daily. 30 tablet 3  . diclofenac (VOLTAREN) 75 MG EC tablet Take 1 tablet (75 mg total) by mouth 2 (two) times daily. (Patient not taking: Reported on 12/24/2014) 60 tablet 5  . docusate sodium (COLACE) 100 MG capsule Take 200 mg by mouth at bedtime. For stool softner    . albuterol (PROVENTIL HFA;VENTOLIN HFA) 108 (90 BASE) MCG/ACT inhaler Inhale 2 puffs into the lungs every 6 (six) hours as needed for wheezing or shortness of breath. 1 Inhaler 2  . cefUROXime (CEFTIN) 250 MG tablet Take 1 tablet (250 mg total) by mouth 2 (two) times daily with a meal. 14 tablet 0  . ciprofloxacin (CIPRO) 250 MG tablet Take 1 tablet (250 mg total) by mouth 2 (two) times daily. 6 tablet 0  . Multiple Vitamin (MULITIVITAMIN WITH MINERALS) TABS Take 1 tablet by mouth daily.    . predniSONE (DELTASONE) 20 MG tablet Two daily with  food 10 tablet 0   No facility-administered medications prior to visit.    History   Social History  . Marital Status: Divorced    Spouse Name: N/A  . Number of Children: N/A  . Years of Education: N/A   Occupational History  . Mortgage    Social History Main Topics  . Smoking status: Never Smoker   . Smokeless tobacco: Not on file  . Alcohol Use: No  . Drug Use: No  . Sexual Activity: No   Other Topics Concern  . None   Social History Narrative     Review of  Systems  Constitutional: Negative for fever, chills and appetite change.  HENT: Negative for congestion, ear pain, postnasal drip, sinus pressure and sore throat.   Eyes: Negative for pain and redness.  Respiratory: Negative for cough, shortness of breath and wheezing.   Cardiovascular: Negative for leg swelling.  Gastrointestinal: Positive for abdominal pain. Negative for nausea, vomiting, diarrhea, constipation and blood in stool.  Endocrine: Negative for polyuria.  Genitourinary: Negative for dysuria, urgency, frequency and flank pain.  Musculoskeletal: Negative for gait problem.  Skin: Negative for rash.  Neurological: Negative for weakness and headaches.  Psychiatric/Behavioral: Negative for confusion and decreased concentration. The patient is not nervous/anxious.     Objective:  BP 120/90 mmHg  Pulse 93  Temp(Src) 98.4 F (36.9 C) (Oral)  Resp 16  Ht 5' 7.5" (1.715 m)  Wt 210 lb (95.255 kg)  BMI 32.39 kg/m2  SpO2 97%  LMP 08/10/2012  Physical Exam  Constitutional: She is oriented to person, place, and time. She appears well-developed and well-nourished.  HENT:  Head: Normocephalic and atraumatic.  Eyes: Conjunctivae are normal. Pupils are equal, round, and reactive to light.  Neck: Normal range of motion. Neck supple.  Cardiovascular: Normal rate and regular rhythm.   Pulmonary/Chest: Effort normal. No respiratory distress. She has no wheezes. She has no rales.  Abdominal: Soft. Bowel sounds are normal. There is no tenderness.  Musculoskeletal: Normal range of motion. She exhibits no edema.  Neurological: She is alert and oriented to person, place, and time.  Skin: Skin is warm and dry.  Psychiatric: She has a normal mood and affect. Her behavior is normal. Thought content normal.   she has tenderness in both the right and the left lower quadrants is no guarding or rebound.  Assessment & Plan:   Angelica Sharp was seen today for abdominal cramping and back pain.  Diagnoses  and all orders for this visit:  Diverticulitis of colon  Lower abdominal pain Orders: -     POCT CBC -     POCT urinalysis dipstick -     POCT UA - Microscopic Only  Other orders -     ciprofloxacin (CIPRO) 500 MG tablet; Take 1 tablet (500 mg total) by mouth 2 (two) times daily. -     metroNIDAZOLE (FLAGYL) 500 MG tablet; Take 1 tablet (500 mg total) by mouth 4 (four) times daily. -     ondansetron (ZOFRAN-ODT) 8 MG disintegrating tablet; Take 1 tablet (8 mg total) by mouth every 8 (eight) hours as needed for nausea.   I have discontinued Ms. Kobs's multivitamin with minerals, albuterol, cefUROXime, ciprofloxacin, and predniSONE. I am also having her start on ciprofloxacin, metroNIDAZOLE, and ondansetron. Additionally, I am having her maintain her docusate sodium, Cholecalciferol (VITAMIN D PO), KRILL OIL PO, calcium-vitamin D, aspirin, hydrochlorothiazide, glucose blood, ALPRAZolam, metFORMIN, diclofenac, and pantoprazole.  Meds ordered this encounter  Medications  .  ciprofloxacin (CIPRO) 500 MG tablet    Sig: Take 1 tablet (500 mg total) by mouth 2 (two) times daily.    Dispense:  20 tablet    Refill:  0  . metroNIDAZOLE (FLAGYL) 500 MG tablet    Sig: Take 1 tablet (500 mg total) by mouth 4 (four) times daily.    Dispense:  40 tablet    Refill:  0  . ondansetron (ZOFRAN-ODT) 8 MG disintegrating tablet    Sig: Take 1 tablet (8 mg total) by mouth every 8 (eight) hours as needed for nausea.    Dispense:  30 tablet    Refill:  0    Appropriate red flag conditions were discussed with the patient as well as actions that should be taken.  Patient expressed his understanding.  Follow-up: Return if symptoms worsen or fail to improve.  Roselee Culver, MD   Results for orders placed or performed in visit on 12/24/14  POCT CBC  Result Value Ref Range   WBC 11.5 (A) 4.6 - 10.2 K/uL   Lymph, poc 2.0 0.6 - 3.4   POC LYMPH PERCENT 17.1 10 - 50 %L   MID (cbc) 0.6 0 - 0.9    POC MID % 5.6 0 - 12 %M   POC Granulocyte 8.9 (A) 2 - 6.9   Granulocyte percent 77.3 37 - 80 %G   RBC 4.91 4.04 - 5.48 M/uL   Hemoglobin 12.3 12.2 - 16.2 g/dL   HCT, POC 37.9 37.7 - 47.9 %   MCV 77.3 (A) 80 - 97 fL   MCH, POC 25.1 (A) 27 - 31.2 pg   MCHC 32.5 31.8 - 35.4 g/dL   RDW, POC 15.3 %   Platelet Count, POC 359 142 - 424 K/uL   MPV 7.1 0 - 99.8 fL  POCT urinalysis dipstick  Result Value Ref Range   Color, UA yellow    Clarity, UA clear    Glucose, UA neg    Bilirubin, UA neg    Ketones, UA neg    Spec Grav, UA 1.010    Blood, UA neg    pH, UA 6.5    Protein, UA neg    Urobilinogen, UA 0.2    Nitrite, UA neg    Leukocytes, UA small (1+) (A) Negative  POCT UA - Microscopic Only  Result Value Ref Range   WBC, Ur, HPF, POC neg    RBC, urine, microscopic neg    Bacteria, U Microscopic neg    Mucus, UA neg    Epithelial cells, urine per micros neg    Crystals, Ur, HPF, POC neg    Casts, Ur, LPF, POC neg    Yeast, UA neg

## 2014-12-30 ENCOUNTER — Emergency Department (HOSPITAL_BASED_OUTPATIENT_CLINIC_OR_DEPARTMENT_OTHER): Payer: BLUE CROSS/BLUE SHIELD

## 2014-12-30 ENCOUNTER — Emergency Department (HOSPITAL_BASED_OUTPATIENT_CLINIC_OR_DEPARTMENT_OTHER)
Admission: EM | Admit: 2014-12-30 | Discharge: 2014-12-30 | Disposition: A | Discharge status: Home / Self Care | Payer: BLUE CROSS/BLUE SHIELD | Attending: Emergency Medicine | Admitting: Emergency Medicine

## 2014-12-30 ENCOUNTER — Encounter (HOSPITAL_BASED_OUTPATIENT_CLINIC_OR_DEPARTMENT_OTHER): Payer: Self-pay

## 2014-12-30 DIAGNOSIS — Z792 Long term (current) use of antibiotics: Secondary | ICD-10-CM | POA: Insufficient documentation

## 2014-12-30 DIAGNOSIS — Z90710 Acquired absence of both cervix and uterus: Secondary | ICD-10-CM | POA: Insufficient documentation

## 2014-12-30 DIAGNOSIS — R1032 Left lower quadrant pain: Secondary | ICD-10-CM | POA: Diagnosis not present

## 2014-12-30 DIAGNOSIS — Z9851 Tubal ligation status: Secondary | ICD-10-CM | POA: Insufficient documentation

## 2014-12-30 DIAGNOSIS — I10 Essential (primary) hypertension: Secondary | ICD-10-CM | POA: Diagnosis not present

## 2014-12-30 DIAGNOSIS — F419 Anxiety disorder, unspecified: Secondary | ICD-10-CM | POA: Diagnosis not present

## 2014-12-30 DIAGNOSIS — Z7982 Long term (current) use of aspirin: Secondary | ICD-10-CM | POA: Insufficient documentation

## 2014-12-30 DIAGNOSIS — E119 Type 2 diabetes mellitus without complications: Secondary | ICD-10-CM | POA: Insufficient documentation

## 2014-12-30 DIAGNOSIS — R109 Unspecified abdominal pain: Secondary | ICD-10-CM | POA: Diagnosis present

## 2014-12-30 DIAGNOSIS — K219 Gastro-esophageal reflux disease without esophagitis: Secondary | ICD-10-CM | POA: Diagnosis not present

## 2014-12-30 DIAGNOSIS — M1990 Unspecified osteoarthritis, unspecified site: Secondary | ICD-10-CM | POA: Diagnosis not present

## 2014-12-30 DIAGNOSIS — Z79899 Other long term (current) drug therapy: Secondary | ICD-10-CM | POA: Diagnosis not present

## 2014-12-30 DIAGNOSIS — M549 Dorsalgia, unspecified: Secondary | ICD-10-CM | POA: Insufficient documentation

## 2014-12-30 DIAGNOSIS — Z862 Personal history of diseases of the blood and blood-forming organs and certain disorders involving the immune mechanism: Secondary | ICD-10-CM | POA: Diagnosis not present

## 2014-12-30 LAB — COMPREHENSIVE METABOLIC PANEL
ALT: 26 U/L (ref 14–54)
AST: 31 U/L (ref 15–41)
Albumin: 3.8 g/dL (ref 3.5–5.0)
Alkaline Phosphatase: 74 U/L (ref 38–126)
Anion gap: 9 (ref 5–15)
BUN: 11 mg/dL (ref 6–20)
CO2: 28 mmol/L (ref 22–32)
Calcium: 8.9 mg/dL (ref 8.9–10.3)
Chloride: 100 mmol/L — ABNORMAL LOW (ref 101–111)
Creatinine, Ser: 1.04 mg/dL — ABNORMAL HIGH (ref 0.44–1.00)
GFR calc Af Amer: >60 mL/min (ref >60)
GFR calc non Af Amer: >60 mL/min (ref >60)
Glucose, Bld: 118 mg/dL — ABNORMAL HIGH (ref 65–99)
Potassium: 3.5 mmol/L (ref 3.5–5.1)
Sodium: 137 mmol/L (ref 135–145)
Total Bilirubin: 0.1 mg/dL — ABNORMAL LOW (ref 0.3–1.2)
Total Protein: 7.3 g/dL (ref 6.5–8.1)

## 2014-12-30 LAB — URINALYSIS, ROUTINE W REFLEX MICROSCOPIC
Bilirubin Urine: NEGATIVE
Glucose, UA: NEGATIVE mg/dL
Hgb urine dipstick: NEGATIVE
Ketones, ur: NEGATIVE mg/dL
Leukocytes, UA: NEGATIVE
Nitrite: NEGATIVE
Protein, ur: NEGATIVE mg/dL
Specific Gravity, Urine: 1.014 (ref 1.005–1.030)
Urobilinogen, UA: 0.2 mg/dL (ref 0.0–1.0)
pH: 6.5 (ref 5.0–8.0)

## 2014-12-30 LAB — CBC WITH DIFFERENTIAL/PLATELET
Basophils Absolute: 0 10*3/uL (ref 0.0–0.1)
Basophils Relative: 1 % (ref 0–1)
Eosinophils Absolute: 0.2 10*3/uL (ref 0.0–0.7)
Eosinophils Relative: 2 % (ref 0–5)
HCT: 38.7 % (ref 36.0–46.0)
Hemoglobin: 12.1 g/dL (ref 12.0–15.0)
Lymphocytes Relative: 18 % (ref 12–46)
Lymphs Abs: 1.6 10*3/uL (ref 0.7–4.0)
MCH: 25.4 pg — ABNORMAL LOW (ref 26.0–34.0)
MCHC: 31.3 g/dL (ref 30.0–36.0)
MCV: 81.3 fL (ref 78.0–100.0)
Monocytes Absolute: 0.5 10*3/uL (ref 0.1–1.0)
Monocytes Relative: 5 % (ref 3–12)
Neutro Abs: 6.6 10*3/uL (ref 1.7–7.7)
Neutrophils Relative %: 74 % (ref 43–77)
Platelets: 350 10*3/uL (ref 150–400)
RBC: 4.76 MIL/uL (ref 3.87–5.11)
RDW: 14.8 % (ref 11.5–15.5)
WBC: 8.9 10*3/uL (ref 4.0–10.5)

## 2014-12-30 MED ORDER — IOHEXOL 300 MG/ML  SOLN: 50.0000 mL | Freq: Once | INTRAMUSCULAR | Status: DC | PRN

## 2014-12-30 MED ORDER — TRAMADOL HCL 50 MG PO TABS: 50.0000 mg | ORAL_TABLET | Freq: Four times a day (QID) | ORAL | Status: DC | PRN

## 2014-12-30 MED ORDER — IOHEXOL 300 MG/ML  SOLN
100.0000 mL | Freq: Once | INTRAMUSCULAR | Status: AC | PRN
  Administered 2014-12-30: 100 mL via INTRAVENOUS

## 2014-12-30 MED ORDER — HYDROMORPHONE HCL 1 MG/ML IJ SOLN
0.5000 mg | Freq: Once | INTRAMUSCULAR | Status: AC
  Administered 2014-12-30: 0.5 mg via INTRAVENOUS
  Filled 2014-12-30: qty 1

## 2014-12-30 MED ORDER — SODIUM CHLORIDE 0.9 % IV BOLUS (SEPSIS)
1000.0000 mL | Freq: Once | INTRAVENOUS | Status: AC
  Administered 2014-12-30: 1000 mL via INTRAVENOUS

## 2014-12-30 MED ORDER — ONDANSETRON HCL 4 MG/2ML IJ SOLN
4.0000 mg | Freq: Once | INTRAMUSCULAR | Status: AC
  Administered 2014-12-30: 4 mg via INTRAVENOUS
  Filled 2014-12-30: qty 2

## 2014-12-30 NOTE — ED Provider Notes (Signed)
CSN: [id number]     Arrival date & time 12/30/14  1246 History   First MD Initiated Contact with Patient 12/30/14 1257     Chief Complaint  Patient presents with  . Back Pain     (Consider location/radiation/quality/duration/timing/severity/associated sxs/prior Treatment) HPI   49 year old female with back and abdominal pain. Worsening since early this morning. Gradual onset. Progressively worsening. She was recently diagnosed with diverticulitis and has been taking ciprofloxacin and metronidazole. It sounds like presumptive treatment by PCP based on previous episodes. Reports she did not have imaging. Reports improvement of initial symptoms up until today. This pain is also little bit different than her prior symptoms that she was having. No fevers or chills. No urinary complaints. No change in her bowel movements. Surgical history significant for hysterectomy  Past Medical History  Diagnosis Date  . Chest pain   . SOB (shortness of breath)   . Edema     lower extremity  . Borderline hypertension   . Arthritis   . Family history of anesthesia complication     sister had BP complications during anes.  . Hypertension   . Degenerative lumbar disc     L5S1  . GERD (gastroesophageal reflux disease)   . H/O hiatal hernia   . Diverticulitis   . Diabetes mellitus without complication   . Allergy   . Anemia   . Anxiety    Past Surgical History  Procedure Laterality Date  . Tubal ligation    . Breast lumpectomy      3 lumps -- two from right -- one from left  . Laparoscopic assisted vaginal hysterectomy N/A 09/18/2012    Procedure: LAPAROSCOPIC ASSISTED VAGINAL HYSTERECTOMY;  Surgeon: Linda Hedges, DO;  Location: [facility];  Service: Gynecology;  Laterality: N/A;  BLADDER  DISTENTION  . Abdominal hysterectomy     Family History  Problem Relation Age of Onset  . Heart attack Mother   . Diabetes Mother   . Hyperlipidemia Mother   . Hypertension Mother   . Heart attack Sister   .  Pulmonary embolism Father   . Clotting disorder Father   . Diabetes Sister   . Kidney disease Sister     kidney failure  . Heart disease Sister   . Cancer Sister    History  Substance Use Topics  . Smoking status: Never Smoker   . Smokeless tobacco: Not on file  . Alcohol Use: No   OB History    No data available     Review of Systems  All systems reviewed and negative, other than as noted in HPI.   Allergies  Morphine and related and Septra  Home Medications   Prior to Admission medications   Medication Sig Start Date End Date Taking? Authorizing Provider  polyethylene glycol (MIRALAX / GLYCOLAX) packet Take 17 g by mouth daily.   Yes Historical Provider, MD  ALPRAZolam Duanne Moron) 0.25 MG tablet Take 1 tablet daily by mouth prn anxiety. 10/01/14   Barton Fanny, MD  aspirin 81 MG tablet Take 81 mg by mouth daily.    Historical Provider, MD  calcium-vitamin D (OSCAL WITH D) 500-200 MG-UNIT per tablet Take 1 tablet by mouth.    Historical Provider, MD  Cholecalciferol (VITAMIN D PO) Take 1 capsule by mouth daily.    Historical Provider, MD  ciprofloxacin (CIPRO) 500 MG tablet Take 1 tablet (500 mg total) by mouth 2 (two) times daily. 12/24/14   Roselee Culver, MD  glucose blood test  strip Use as instructed 03/15/14   Chelle Jeffery, PA-C  hydrochlorothiazide (HYDRODIURIL) 25 MG tablet TAKE 1 TABLET (25 MG TOTAL) BY MOUTH DAILY. 03/12/14   Barton Fanny, MD  KRILL OIL PO Take by mouth.    Historical Provider, MD  metFORMIN (GLUCOPHAGE) 500 MG tablet Take one tablet twice a day with meals. 10/01/14   Barton Fanny, MD  metroNIDAZOLE (FLAGYL) 500 MG tablet Take 1 tablet (500 mg total) by mouth 4 (four) times daily. 12/24/14   Roselee Culver, MD  ondansetron (ZOFRAN-ODT) 8 MG disintegrating tablet Take 1 tablet (8 mg total) by mouth every 8 (eight) hours as needed for nausea. 12/24/14   Roselee Culver, MD  pantoprazole (PROTONIX) 40 MG tablet Take 1 tablet (40  mg total) by mouth daily. 10/12/14   Barton Fanny, MD   BP 129/69 mmHg  Pulse 88  Temp(Src) 97.9 F (36.6 C) (Oral)  Resp 18  Ht 5\' 7"  (1.702 m)  Wt 210 lb (95.255 kg)  BMI 32.88 kg/m2  SpO2 97%  LMP 08/10/2012 Physical Exam  Constitutional: She appears well-developed and well-nourished. No distress.  HENT:  Head: Normocephalic and atraumatic.  Eyes: Conjunctivae are normal. Right eye exhibits no discharge. Left eye exhibits no discharge.  Neck: Neck supple.  Cardiovascular: Normal rate, regular rhythm and normal heart sounds.  Exam reveals no gallop and no friction rub.   No murmur heard. Pulmonary/Chest: Effort normal and breath sounds normal. No respiratory distress.  Abdominal: Soft. She exhibits no distension. There is tenderness.  Mild LLQ and suprapubic tenderness w/o rebound or guarding.   Genitourinary:  No cva tenderness.   Musculoskeletal: She exhibits no edema or tenderness.  Neurological: She is alert.  Skin: Skin is warm and dry.  Psychiatric: She has a normal mood and affect. Her behavior is normal. Thought content normal.  Nursing note and vitals reviewed.   ED Course  Procedures (including critical care time) Labs Review Labs Reviewed  CBC WITH DIFFERENTIAL/PLATELET - Abnormal; Notable for the following:    MCH 25.4 (*)    All other components within normal limits  COMPREHENSIVE METABOLIC PANEL - Abnormal; Notable for the following:    Chloride 100 (*)    Glucose, Bld 118 (*)    Creatinine, Ser 1.04 (*)    Total Bilirubin 0.1 (*)    All other components within normal limits  URINALYSIS, ROUTINE W REFLEX MICROSCOPIC (NOT AT [facility])    Imaging Review No results found.   Ct Abdomen Pelvis W Contrast  12/30/2014   CLINICAL DATA:  Lower abdominal pain.  EXAM: CT ABDOMEN AND PELVIS WITH CONTRAST  TECHNIQUE: Multidetector CT imaging of the abdomen and pelvis was performed using the standard protocol following bolus administration of intravenous  contrast.  CONTRAST:  15mL OMNIPAQUE IOHEXOL 300 MG/ML  SOLN  COMPARISON:  CT scan of June 03, 2014.  FINDINGS: Visualized lung bases appear normal. No significant osseous abnormality is noted.  No gallstones are noted. Stable left hepatic cyst is noted. The spleen and pancreas appear normal. Adrenal glands appear normal. Small left renal cyst is noted. No hydronephrosis or renal obstruction is noted. No renal or ureteral calculi are noted. There is no evidence of bowel obstruction. The appendix appears normal. Retro aortic left renal vein is noted. Mild focal sigmoid diverticulitis is noted best seen on image number 58 of series 5. Status post hysterectomy. Ovaries appear normal. No abnormal fluid collection is noted. No significant adenopathy is noted.  IMPRESSION:  Mild focal sigmoid diverticulitis is noted without abscess formation.   Electronically Signed   By: Marijo Conception, M.D.   On: 12/30/2014 15:37    EKG Interpretation   Date/Time:  Thursday December 30 2014 13:01:07 EDT Ventricular Rate:  75 PR Interval:  108 QRS Duration: 98 QT Interval:  396 QTC Calculation: 442 R Axis:   -28 Text Interpretation:  Sinus rhythm with short PR Cannot rule out Anterior  infarct , age undetermined similar to previous from 09/2012 Confirmed by  [place]  MD, Powell [phone]) on 12/30/2014 1:04:43 PM      MDM   Final diagnoses:  Abdominal pain, unspecified abdominal location   48yF with abdominal pain. Mild tenderness on exam. Imaging as above. Feeling better. Continue tx for diverticulitis. It has been determined that no acute conditions requiring further emergency intervention are present at this time. The patient has been advised of the diagnosis and plan. I reviewed any labs and imaging including any potential incidental findings. We have discussed signs and symptoms that warrant return to the ED and they are listed in the discharge instructions.       Virgel Manifold, MD 01/02/15 [phone]

## 2014-12-30 NOTE — ED Notes (Addendum)
Pt here for evaluation of back pain that started this am.  States pain radiates to upper back and chest; associated with nausea. Nothing makes the pain better or worse.  Took Xanax, Tylenol and topical pain reliever without relief. Currently receiving treatment for diverticulitis (Cipro and Flagyl)

## 2014-12-30 NOTE — ED Notes (Signed)
Patient transported to CT 

## 2014-12-30 NOTE — ED Notes (Signed)
MD at bedside. 

## 2014-12-30 NOTE — Discharge Instructions (Signed)

## 2015-01-06 ENCOUNTER — Other Ambulatory Visit: Payer: Self-pay | Admitting: Emergency Medicine

## 2015-02-18 ENCOUNTER — Other Ambulatory Visit: Payer: Self-pay

## 2015-02-18 MED ORDER — PANTOPRAZOLE SODIUM 40 MG PO TBEC: 40.0000 mg | DELAYED_RELEASE_TABLET | Freq: Every day | ORAL | Status: DC

## 2015-03-16 ENCOUNTER — Telehealth: Payer: Self-pay

## 2015-03-16 NOTE — Telephone Encounter (Signed)
Pt was seen by Dr Ouida Sills on 7/15. She said her took her off her diclofenac (VOLTAREN) 75 MG EC tablet [[id number]] DISCONTINUED because of her diabetes. She feels she needs this script again-the cold weather is making her symptoms act up. I let her know she might have to be seen for this. Please advise at [phone]

## 2015-03-16 NOTE — Telephone Encounter (Signed)
Patient reports that she was told by Dr. Ouida Sills on 12/24/2014 to stop taking diclofenac. This is not in line with his note. She would like to know she could refill this at her pharmacy. I told her that she can go ahead and do this but the concern is regarding her diabetes, kidney function and use of NSAID. I recommended she stay away from Aleve and ibuprofen. Patient states that Tylenol did not touch her arthritic pain but diclofenac did. Patient plans on setting up an appointment for recheck of her diabetes and creatinine level. She will call if she needs refills. I discussed potential for adverse effects, patient verbalized understanding and is stopped diclofenac if she experiences AE's.

## 2015-03-22 ENCOUNTER — Other Ambulatory Visit: Payer: Self-pay

## 2015-03-22 MED ORDER — HYDROCHLOROTHIAZIDE 25 MG PO TABS: ORAL_TABLET | ORAL | Status: DC

## 2015-03-29 ENCOUNTER — Encounter: Payer: Self-pay | Admitting: Physician Assistant

## 2015-03-29 ENCOUNTER — Ambulatory Visit (INDEPENDENT_AMBULATORY_CARE_PROVIDER_SITE_OTHER): Payer: BLUE CROSS/BLUE SHIELD | Admitting: Physician Assistant

## 2015-03-29 VITALS — BP 109/74 | HR 79 | Temp 98.0°F | Resp 16 | Ht 67.25 in | Wt 209.4 lb

## 2015-03-29 DIAGNOSIS — Z114 Encounter for screening for human immunodeficiency virus [HIV]: Secondary | ICD-10-CM

## 2015-03-29 DIAGNOSIS — Z23 Encounter for immunization: Secondary | ICD-10-CM

## 2015-03-29 DIAGNOSIS — R609 Edema, unspecified: Secondary | ICD-10-CM | POA: Diagnosis not present

## 2015-03-29 DIAGNOSIS — G8929 Other chronic pain: Secondary | ICD-10-CM | POA: Diagnosis not present

## 2015-03-29 DIAGNOSIS — E119 Type 2 diabetes mellitus without complications: Secondary | ICD-10-CM

## 2015-03-29 DIAGNOSIS — E785 Hyperlipidemia, unspecified: Secondary | ICD-10-CM

## 2015-03-29 DIAGNOSIS — E669 Obesity, unspecified: Secondary | ICD-10-CM | POA: Diagnosis not present

## 2015-03-29 DIAGNOSIS — M2550 Pain in unspecified joint: Secondary | ICD-10-CM

## 2015-03-29 LAB — COMPREHENSIVE METABOLIC PANEL
ALT: 24 U/L (ref 6–29)
AST: 20 U/L (ref 10–35)
Albumin: 4.3 g/dL (ref 3.6–5.1)
Alkaline Phosphatase: 89 U/L (ref 33–130)
BUN: 14 mg/dL (ref 7–25)
CHLORIDE: 101 mmol/L (ref 98–110)
CO2: 26 mmol/L (ref 20–31)
Calcium: 9.5 mg/dL (ref 8.6–10.4)
Creat: 0.99 mg/dL (ref 0.50–1.05)
Glucose, Bld: 88 mg/dL (ref 65–99)
POTASSIUM: 4.2 mmol/L (ref 3.5–5.3)
Sodium: 138 mmol/L (ref 135–146)
TOTAL PROTEIN: 7 g/dL (ref 6.1–8.1)
Total Bilirubin: 0.4 mg/dL (ref 0.2–1.2)

## 2015-03-29 LAB — HEMOGLOBIN A1C: Hgb A1c MFr Bld: 6.5 % — AB (ref 4.0–6.0)

## 2015-03-29 LAB — LIPID PANEL
CHOL/HDL RATIO: 6.9 ratio — AB (ref <=5.0)
Cholesterol: 241 mg/dL — ABNORMAL HIGH (ref 125–200)
HDL: 35 mg/dL — AB (ref >=46)
LDL CALC: 157 mg/dL — AB (ref <130)
TRIGLYCERIDES: 247 mg/dL — AB (ref <150)
VLDL: 49 mg/dL — ABNORMAL HIGH (ref <30)

## 2015-03-29 LAB — POCT GLYCOSYLATED HEMOGLOBIN (HGB A1C): Hemoglobin A1C: 6.5

## 2015-03-29 LAB — GLUCOSE, POCT (MANUAL RESULT ENTRY): POC Glucose: 90 mg/dl (ref 70–99)

## 2015-03-29 MED ORDER — HYDROCHLOROTHIAZIDE 25 MG PO TABS: ORAL_TABLET | ORAL | Status: DC

## 2015-03-29 NOTE — Progress Notes (Signed)
Subjective:    Patient ID: Angelica Sharp, female    DOB: [birth year], 49 y.o.   MRN: [id number]  HPI Patient presents for T2DM f/u. Needs A1C and Cmet checked prior to re-starting NSAIDs for arthritis. Has tried Voltaren in the past. Patient has history of arthritis in lower back, neck, feet, hands, and R hip x 2-3 years, however the R hip has been particularly bothersome and has worsened over the last 2-3 months. Checks blood sugars twice weekly at home, normally run in the 120-125 range, however this morning it was 151. Continues to take metformin BID with no complaints. Had diabetic annual eye and foot exam in April. Patient requesting refills on HCTZ for lower extremity edema. No other concerns on today's visit.    Patient Active Problem List   Diagnosis Date Noted  . DM II (diabetes mellitus, type II), controlled ([place]) 10/01/2014  . Obesity 03/12/2014  . 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 Hyperlipidemia 09/10/2013  . Inconclusive mammogram 08/31/2013  . Family history of cancer 08/30/2012  . GERD (gastroesophageal reflux disease) 07/03/2011  . COUGH 04/18/2010  . EDEMA 01/24/2010  . CHEST PAIN 01/24/2010   Family History  Problem Relation Age of Onset  . Heart attack Mother   . Diabetes Mother   . Hyperlipidemia Mother   . Hypertension Mother   . Heart attack Sister   . Pulmonary embolism Father   . Clotting disorder Father   . Diabetes Sister   . Kidney disease Sister     kidney failure  . Heart disease Sister   . Cancer Sister    Social History   Social History  . Marital Status: Divorced    Spouse Name: N/A  . Number of Children: N/A  . Years of Education: N/A   Occupational History  . Mortgage    Social History Main Topics  . Smoking status: Never Smoker   . Smokeless tobacco: Not on file  . Alcohol Use: No  . Drug Use: No  . Sexual Activity: No   Other Topics Concern  . Not on file   Social History Narrative   Prior to Admission  medications   Medication Sig Start Date End Date Taking? Authorizing Provider  ALPRAZolam Duanne Moron) 0.25 MG tablet Take 1 tablet daily by mouth prn anxiety. 10/01/14  Yes Barton Fanny, MD  aspirin 81 MG tablet Take 81 mg by mouth daily.   Yes Historical Provider, MD  calcium-vitamin D (OSCAL WITH D) 500-200 MG-UNIT per tablet Take 1 tablet by mouth.   Yes Historical Provider, MD  Cholecalciferol (VITAMIN D PO) Take 1 capsule by mouth daily.   Yes Historical Provider, MD  hydrochlorothiazide (HYDRODIURIL) 25 MG tablet TAKE 1 TABLET (25 MG TOTAL) BY MOUTH DAILY. 03/29/15  Yes Chelle Jeffery, PA-C  KRILL OIL PO Take by mouth.   Yes Historical Provider, MD  metFORMIN (GLUCOPHAGE) 500 MG tablet Take one tablet twice a day with meals. 10/01/14  Yes Barton Fanny, MD  pantoprazole (PROTONIX) 40 MG tablet Take 1 tablet (40 mg total) by mouth daily. 02/18/15  Yes Jaynee Eagles, PA-C  polyethylene glycol (MIRALAX / GLYCOLAX) packet Take 17 g by mouth daily.   Yes Historical Provider, MD  glucose blood test strip Use as instructed 03/15/14   Chelle Jeffery, PA-C  ondansetron (ZOFRAN-ODT) 8 MG disintegrating tablet Take 1 tablet (8 mg total) by mouth every 8 (eight) hours as needed for nausea. 12/24/14   Roselee Culver, MD  traMADol Veatrice Bourbon)  50 MG tablet Take 1 tablet (50 mg total) by mouth every 6 (six) hours as needed. Patient not taking: Reported on 03/29/2015 12/30/14   Virgel Manifold, MD   Allergies  Allergen Reactions  . Morphine And Related     Headache and hallucinations  . Septra [Sulfamethoxazole-Trimethoprim]     Increased heartrate   Review of Systems  Constitutional: Negative for fever and fatigue.  HENT: Negative.   Eyes: Negative.   Respiratory: Negative for cough and wheezing.   Cardiovascular: Negative for chest pain, palpitations and leg swelling.  Gastrointestinal: Negative for vomiting, abdominal pain, diarrhea, constipation and blood in stool.  Endocrine: Negative for  polydipsia, polyphagia and polyuria.  Genitourinary: Negative for urgency, frequency and difficulty urinating.      Objective:   Physical Exam  Constitutional: She is oriented to person, place, and time. She appears well-developed and well-nourished. No distress.  BP 109/74 mmHg  Pulse 79  Temp(Src) 98 F (36.7 C) (Oral)  Resp 16  Ht 5' 7.25" (1.708 m)  Wt 209 lb 6.4 oz (94.983 kg)  BMI 32.56 kg/m2  LMP 08/10/2012  HENT:  Head: Normocephalic and atraumatic.  Eyes: EOM are normal. No scleral icterus.  Neck: Neck supple. No JVD present. No thyromegaly present.  Cardiovascular: Normal rate, regular rhythm, normal heart sounds and intact distal pulses.  Exam reveals no gallop and no friction rub.   No murmur heard. Pulmonary/Chest: Effort normal and breath sounds normal. No respiratory distress. She has no wheezes.  Abdominal: Soft. Bowel sounds are normal. She exhibits no distension and no mass. There is no tenderness.  Musculoskeletal: She exhibits no edema or tenderness.  Lymphadenopathy:    She has no cervical adenopathy.  Neurological: She is alert and oriented to person, place, and time.  Skin: Skin is warm and dry. No rash noted. No erythema.  Psychiatric: She has a normal mood and affect. Her behavior is normal.      Assessment & Plan:  1. Controlled type 2 diabetes mellitus without complication, without long-term current use of insulin (HCC) - Continue metformin 500 mg BID. - POCT glucose (manual entry) - POCT glycosylated hemoglobin (Hb A1C) - Microalbumin, urine - Comprehensive metabolic panel  2. Hyperlipidemia - Awaiting lipid panel results.  - Encourage healthy diet to lower cholesterol and TG levels.  - Lipid panel  3. Obesity - Encourage regular exercise to promote healthy weight loss and good blood glucose control.   4. Need for prophylactic vaccination and inoculation against influenza - Flu Vaccine QUAD 36+ mos IM  5. Screening for HIV (human  immunodeficiency virus) - HIV antibody  6. Edema, unspecified type - Continue current regimen of HCTZ 25 mg daily. Refills ordered.  - hydrochlorothiazide (HYDRODIURIL) 25 MG tablet; TAKE 1 TABLET (25 MG TOTAL) BY MOUTH DAILY.  Dispense: 30 tablet; Refill: 5  Will contact patient via My Chart with lab results as soon as they become available. Return to clinic in 6 weeks following re-initiation of Voltaren (pending kidney function results).

## 2015-03-29 NOTE — Progress Notes (Signed)
Patient ID: Angelica Sharp, female    DOB: 1966/01/02, 49 y.o.   MRN: [id number]  PCP: Wendie Agreste, MD (has not seen him, was made his PCP at an ED visit on 12/30/2014)  Subjective:   Chief Complaint  Patient presents with  . Follow-up    diabetes and labwork    HPI Presents for evaluation of diabetes and renal function.  Needs A1C and Cmet checked prior to re-starting NSAIDs for arthritis.   Has tried Voltaren in the past. It worked well, but she was advised to stop it due to a decline in her renal function.  Patient has history of arthritis in lower back, neck, feet, hands, and R hip x 2-3 years, however the R hip has been particularly bothersome and has worsened over the last 2-3 months.   Checks blood sugars twice weekly at home, normally run in the 120-125 range, however this morning it was 151. Continues to take metformin BID with no complaints. Had diabetic annual eye and foot exam in April.   Patient requesting refills on HCTZ for lower extremity edema.   No other concerns on today's visit.   Medications, allergies, past medical history, surgical history, family history, social history and problem list reviewed and updated.  Allergies  Allergen Reactions  . Morphine And Related     Headache and hallucinations  . Septra [Sulfamethoxazole-Trimethoprim]     Increased heartrate    Prior to Admission medications   Medication Sig Start Date End Date Taking? Authorizing Provider  ALPRAZolam Duanne Moron) 0.25 MG tablet Take 1 tablet daily by mouth prn anxiety. 10/01/14  Yes Barton Fanny, MD  aspirin 81 MG tablet Take 81 mg by mouth daily.   Yes Historical Provider, MD  calcium-vitamin D (OSCAL WITH D) 500-200 MG-UNIT per tablet Take 1 tablet by mouth.   Yes Historical Provider, MD  Cholecalciferol (VITAMIN D PO) Take 1 capsule by mouth daily.   Yes Historical Provider, MD  hydrochlorothiazide (HYDRODIURIL) 25 MG tablet TAKE 1 TABLET (25 MG TOTAL) BY MOUTH DAILY.  03/29/15  Yes Asriel Westrup, PA-C  KRILL OIL PO Take by mouth.   Yes Historical Provider, MD  metFORMIN (GLUCOPHAGE) 500 MG tablet Take one tablet twice a day with meals. 10/01/14  Yes Barton Fanny, MD  pantoprazole (PROTONIX) 40 MG tablet Take 1 tablet (40 mg total) by mouth daily. 02/18/15  Yes Jaynee Eagles, PA-C  polyethylene glycol (MIRALAX / GLYCOLAX) packet Take 17 g by mouth daily.   Yes Historical Provider, MD  glucose blood test strip Use as instructed 03/15/14   Katelyn Kohlmeyer, PA-C  ondansetron (ZOFRAN-ODT) 8 MG disintegrating tablet Take 1 tablet (8 mg total) by mouth every 8 (eight) hours as needed for nausea. 12/24/14   Roselee Culver, MD  traMADol (ULTRAM) 50 MG tablet Take 1 tablet (50 mg total) by mouth every 6 (six) hours as needed. Patient not taking: Reported on 03/29/2015 12/30/14   Virgel Manifold, MD    Review of Systems Constitutional: Negative for fever and fatigue.  HENT: Negative.  Eyes: Negative.  Respiratory: Negative for cough and wheezing.  Cardiovascular: Negative for chest pain, palpitations and leg swelling.  Gastrointestinal: Negative for vomiting, abdominal pain, diarrhea, constipation and blood in stool.  Endocrine: Negative for polydipsia, polyphagia and polyuria.  Genitourinary: Negative for urgency, frequency and difficulty urinating.      Patient Active Problem List   Diagnosis Date Noted  . DM II (diabetes mellitus, type II), controlled ([place]) 10/01/2014  .  Obesity 03/12/2014  . 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 Hyperlipidemia 09/10/2013  . Inconclusive mammogram 08/31/2013  . Family history of cancer 08/30/2012  . GERD (gastroesophageal reflux disease) 07/03/2011  . COUGH 04/18/2010  . EDEMA 01/24/2010  . CHEST PAIN 01/24/2010     Prior to Admission medications   Medication Sig Start Date End Date Taking? Authorizing Provider  ALPRAZolam Duanne Moron) 0.25 MG tablet Take 1 tablet daily by mouth prn anxiety. 10/01/14  Yes Barton Fanny, MD  aspirin 81 MG tablet Take 81 mg by mouth daily.   Yes Historical Provider, MD  calcium-vitamin D (OSCAL WITH D) 500-200 MG-UNIT per tablet Take 1 tablet by mouth.   Yes Historical Provider, MD  Cholecalciferol (VITAMIN D PO) Take 1 capsule by mouth daily.   Yes Historical Provider, MD  hydrochlorothiazide (HYDRODIURIL) 25 MG tablet TAKE 1 TABLET (25 MG TOTAL) BY MOUTH DAILY. 03/29/15  Yes Antoinne Spadaccini, PA-C  KRILL OIL PO Take by mouth.   Yes Historical Provider, MD  metFORMIN (GLUCOPHAGE) 500 MG tablet Take one tablet twice a day with meals. 10/01/14  Yes Barton Fanny, MD  pantoprazole (PROTONIX) 40 MG tablet Take 1 tablet (40 mg total) by mouth daily. 02/18/15  Yes Jaynee Eagles, PA-C  polyethylene glycol (MIRALAX / GLYCOLAX) packet Take 17 g by mouth daily.   Yes Historical Provider, MD  glucose blood test strip Use as instructed 03/15/14   Sheera Illingworth, PA-C  ondansetron (ZOFRAN-ODT) 8 MG disintegrating tablet Take 1 tablet (8 mg total) by mouth every 8 (eight) hours as needed for nausea. 12/24/14   Roselee Culver, MD  traMADol (ULTRAM) 50 MG tablet Take 1 tablet (50 mg total) by mouth every 6 (six) hours as needed. Patient not taking: Reported on 03/29/2015 12/30/14   Virgel Manifold, MD     Allergies  Allergen Reactions  . Morphine And Related     Headache and hallucinations  . Septra [Sulfamethoxazole-Trimethoprim]     Increased heartrate       Objective:  Physical Exam  Constitutional: She is oriented to person, place, and time. She appears well-developed and well-nourished. No distress.  BP 109/74 mmHg  Pulse 79  Temp(Src) 98 F (36.7 C) (Oral)  Resp 16  Ht 5' 7.25" (1.708 m)  Wt 209 lb 6.4 oz (94.983 kg)  BMI 32.56 kg/m2  LMP 08/10/2012   HENT:  Head: Normocephalic and atraumatic.  Eyes: Conjunctivae are normal. No scleral icterus.  Neck: No thyromegaly present.  Cardiovascular: Normal rate, regular rhythm, normal heart sounds and intact distal  pulses.   Pulmonary/Chest: Effort normal and breath sounds normal.  Lymphadenopathy:    She has no cervical adenopathy.  Neurological: She is alert and oriented to person, place, and time.  Skin: Skin is warm and dry.  Psychiatric: She has a normal mood and affect. Her behavior is normal.   Results for orders placed or performed in visit on 03/29/15  POCT glucose (manual entry)  Result Value Ref Range   POC Glucose 90 70 - 99 mg/dl  POCT glycosylated hemoglobin (Hb A1C)  Result Value Ref Range   Hemoglobin A1C 6.5            Assessment & Plan:   1. Controlled type 2 diabetes mellitus without complication, without long-term current use of insulin (HCC) Controlled. Continue current treatment. - POCT glucose (manual entry) - POCT glycosylated hemoglobin (Hb A1C) - Microalbumin, urine - Comprehensive metabolic panel  2. Hyperlipidemia Await lab results. Adjust  regimen if indicated. - Lipid panel  3. Obesity Healthy eating and regular exercise encouraged.  4. Need for prophylactic vaccination and inoculation against influenza - Flu Vaccine QUAD 36+ mos IM  5. Screening for HIV (human immunodeficiency virus) - HIV antibody  6. Edema, unspecified type Stable. Continue current treatment. - hydrochlorothiazide (HYDRODIURIL) 25 MG tablet; TAKE 1 TABLET (25 MG TOTAL) BY MOUTH DAILY.  Dispense: 30 tablet; Refill: 5  7. Osteoarthritis of multiple sites Await results of CMET. If renal function is OK, she will resume voltaren that she has at home.   Return in about 6 weeks (around 05/10/2015). Will recheck renal function, back on diclofenac.   Fara Chute, PA-C Physician Assistant-Certified [facility]

## 2015-03-29 NOTE — Patient Instructions (Signed)
I will contact you with your lab results as soon as they are available.   If you have not heard from me in 2 weeks, please contact me.  The fastest way to get your results is to register for My Chart (see the instructions on the last page of this printout).   

## 2015-03-30 LAB — MICROALBUMIN, URINE: Microalb, Ur: 0.8 mg/dL

## 2015-03-30 LAB — HIV ANTIBODY (ROUTINE TESTING W REFLEX): HIV: NONREACTIVE

## 2015-04-01 MED ORDER — ATORVASTATIN CALCIUM 20 MG PO TABS: 20.0000 mg | ORAL_TABLET | Freq: Every day | ORAL | Status: DC

## 2015-04-01 NOTE — Addendum Note (Signed)
Addended by: Fara Chute on: 04/01/2015 09:47 AM   Modules accepted: Orders

## 2015-04-04 ENCOUNTER — Encounter: Payer: Self-pay | Admitting: Family Medicine

## 2015-04-07 ENCOUNTER — Ambulatory Visit: Payer: BLUE CROSS/BLUE SHIELD | Admitting: Physician Assistant

## 2015-04-11 LAB — HM MAMMOGRAPHY

## 2015-04-20 ENCOUNTER — Encounter: Payer: Self-pay | Admitting: Physician Assistant

## 2015-05-09 ENCOUNTER — Other Ambulatory Visit: Payer: Self-pay | Admitting: Physician Assistant

## 2015-05-10 ENCOUNTER — Encounter: Payer: Self-pay | Admitting: Physician Assistant

## 2015-05-10 ENCOUNTER — Ambulatory Visit (INDEPENDENT_AMBULATORY_CARE_PROVIDER_SITE_OTHER): Payer: BLUE CROSS/BLUE SHIELD | Admitting: Physician Assistant

## 2015-05-10 VITALS — BP 115/78 | HR 78 | Temp 98.6°F | Resp 16 | Ht 67.75 in | Wt 206.0 lb

## 2015-05-10 DIAGNOSIS — R05 Cough: Secondary | ICD-10-CM | POA: Diagnosis not present

## 2015-05-10 DIAGNOSIS — Z5181 Encounter for therapeutic drug level monitoring: Secondary | ICD-10-CM | POA: Diagnosis not present

## 2015-05-10 DIAGNOSIS — M2550 Pain in unspecified joint: Secondary | ICD-10-CM

## 2015-05-10 DIAGNOSIS — R059 Cough, unspecified: Secondary | ICD-10-CM

## 2015-05-10 DIAGNOSIS — E119 Type 2 diabetes mellitus without complications: Secondary | ICD-10-CM | POA: Diagnosis not present

## 2015-05-10 DIAGNOSIS — G8929 Other chronic pain: Secondary | ICD-10-CM | POA: Diagnosis not present

## 2015-05-10 MED ORDER — BENZONATATE 100 MG PO CAPS: 100.0000 mg | ORAL_CAPSULE | Freq: Three times a day (TID) | ORAL | Status: DC | PRN

## 2015-05-10 MED ORDER — GLUCOSE BLOOD VI STRP: ORAL_STRIP | Status: AC

## 2015-05-10 NOTE — Progress Notes (Signed)
Patient ID: Angelica Sharp, female    DOB: 1965/08/02, 49 y.o.   MRN: [id number]  PCP: Wendie Agreste, MD  Subjective:   Chief Complaint  Patient presents with  . Follow-up    blood work   . Cough    x 3-4 days   . Nasal Congestion    HPI Presents for evaluation of kidney function related to NSAID therapy.   Currently takes 75 mg of Voltaren once at bedtime for chronic joint pain. Feels like it is not helping her pain at all. Wakes up several times in the night complaining of general body aches and soreness. Hands are swollen in the mornings and her back is still very stiff. Has been on Voltaren in the past with symptomatic relief of her arthritis, however she had to stop the medication d/t impaired kidney function. Denies weakness or numbness/tingling in her upper or lower extremities, no loss of bowel or bladder control.   Started on Lipitor 20 mg for HLD at her last visit 6 weeks ago.   Also complaining of nasal congestion x 4 days. Accompanied by productive cough, rhinorrhea, and watery, itchy eyes. Occasional night sweats and chills. Denies fever, N/V, HA, sore throat, sinus, or ear pain. No visual changes. Has tried Zyrtec in the past with good relief of her allergies.  Currently taking care of her 63-week-old grandson at home with her daughter and does not want to get either of them sick.   Review of Systems Constitutional: Negative for fever, chills, activity change, appetite change and unexpected weight change.  HENT: Positive for congestion and rhinorrhea. Negative for ear pain, sinus pressure and sore throat.  Eyes: Positive for discharge (watery eyes) and itching. Negative for photophobia, pain and visual disturbance.  Respiratory: Positive for cough (yellow-tinged sputum). Negative for shortness of breath and wheezing.  Cardiovascular: Negative for chest pain and palpitations.  Gastrointestinal: Negative for nausea, vomiting and abdominal pain.  Musculoskeletal:  Positive for myalgias (generalized body aches and soreness), back pain, joint swelling (hands) and arthralgias.  Allergic/Immunologic: Positive for environmental allergies.  Neurological: Negative for weakness and headaches.      Patient Active Problem List   Diagnosis Date Noted  . Chronic joint pain 03/29/2015  . DM II (diabetes mellitus, type II), controlled ([place]) 10/01/2014  . Obesity 03/12/2014  . 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 Hyperlipidemia 09/10/2013  . Inconclusive mammogram 08/31/2013  . Family history of cancer 08/30/2012  . GERD (gastroesophageal reflux disease) 07/03/2011  . COUGH 04/18/2010  . EDEMA 01/24/2010  . CHEST PAIN 01/24/2010     Prior to Admission medications   Medication Sig Start Date End Date Taking? Authorizing Provider  ALPRAZolam Duanne Moron) 0.25 MG tablet Take 1 tablet daily by mouth prn anxiety. 10/01/14  Yes Barton Fanny, MD  aspirin 81 MG tablet Take 81 mg by mouth daily.   Yes Historical Provider, MD  atorvastatin (LIPITOR) 20 MG tablet Take 1 tablet (20 mg total) by mouth daily. 04/01/15  Yes Jaedan Schuman, PA-C  calcium-vitamin D (OSCAL WITH D) 500-200 MG-UNIT per tablet Take 1 tablet by mouth.   Yes Historical Provider, MD  Cholecalciferol (VITAMIN D PO) Take 1 capsule by mouth daily.   Yes Historical Provider, MD  diclofenac (VOLTAREN) 75 MG EC tablet Take 75 mg by mouth 2 (two) times daily. 04/07/15  Yes Historical Provider, MD  glucose blood test strip Use as instructed 03/15/14  Yes Wei Newbrough, PA-C  hydrochlorothiazide (HYDRODIURIL) 25 MG tablet TAKE 1  TABLET (25 MG TOTAL) BY MOUTH DAILY. 03/29/15  Yes Lachelle Rissler, PA-C  metFORMIN (GLUCOPHAGE) 500 MG tablet Take one tablet twice a day with meals. 10/01/14  Yes Barton Fanny, MD  pantoprazole (PROTONIX) 40 MG tablet Take 1 tablet (40 mg total) by mouth daily. 02/18/15  Yes Jaynee Eagles, PA-C  polyethylene glycol (MIRALAX / GLYCOLAX) packet Take 17 g by mouth daily.    Yes Historical Provider, MD  KRILL OIL PO Take by mouth.    Historical Provider, MD  ondansetron (ZOFRAN-ODT) 8 MG disintegrating tablet Take 1 tablet (8 mg total) by mouth every 8 (eight) hours as needed for nausea. Patient not taking: Reported on 05/10/2015 12/24/14   Roselee Culver, MD  traMADol (ULTRAM) 50 MG tablet Take 1 tablet (50 mg total) by mouth every 6 (six) hours as needed. Patient not taking: Reported on 03/29/2015 12/30/14   Virgel Manifold, MD     Allergies  Allergen Reactions  . Morphine And Related     Headache and hallucinations  . Septra [Sulfamethoxazole-Trimethoprim]     Increased heartrate       Objective:  Physical Exam  Constitutional: She is oriented to person, place, and time. Vital signs are normal. She appears well-developed and well-nourished. She is active and cooperative. No distress.  BP 115/78 mmHg  Pulse 78  Temp(Src) 98.6 F (37 C) (Oral)  Resp 16  Ht 5' 7.75" (1.721 m)  Wt 206 lb (93.441 kg)  BMI 31.55 kg/m2  SpO2 96%  LMP 08/10/2012  HENT:  Head: Normocephalic and atraumatic.  Right Ear: Hearing normal.  Left Ear: Hearing normal.  Eyes: Conjunctivae are normal. No scleral icterus.  Neck: Normal range of motion. Neck supple. No thyromegaly present.  Cardiovascular: Normal rate, regular rhythm and normal heart sounds.   Pulses:      Radial pulses are 2+ on the right side, and 2+ on the left side.  Pulmonary/Chest: Effort normal and breath sounds normal.  Lymphadenopathy:       Head (right side): No tonsillar, no preauricular, no posterior auricular and no occipital adenopathy present.       Head (left side): No tonsillar, no preauricular, no posterior auricular and no occipital adenopathy present.    She has no cervical adenopathy.       Right: No supraclavicular adenopathy present.       Left: No supraclavicular adenopathy present.  Neurological: She is alert and oriented to person, place, and time. No sensory deficit.  Skin: Skin  is warm, dry and intact. No rash noted. No cyanosis or erythema. Nails show no clubbing.  Psychiatric: She has a normal mood and affect. Her speech is normal and behavior is normal.           Assessment & Plan:   1. Medication monitoring encounter Anticipate stable/normal with resumption of NSAID. - Basic metabolic panel  2. Chronic joint pain Continue voltaren. Consider increase to BID. Trial off atorvastatin. If achiness persists, resume it and increase voltaren to BID. If aches resolve, re-challenge with atorvastatin. If aches recur, d/c atorvastatin and contact me.  3. Cough Allergic vs. Viral. Supportive care. - benzonatate (TESSALON) 100 MG capsule; Take 1-2 capsules (100-200 mg total) by mouth 3 (three) times daily as needed for cough.  Dispense: 40 capsule; Refill: 0  4. Controlled type 2 diabetes mellitus without complication, without long-term current use of insulin (HCC) Controlled. Of note, her 41 year old son was just diagnosed with Diabetes type 1. - glucose blood  test strip; Use as instructed  Dispense: 100 each; Refill: 3    Return in about 4 months (around 09/07/2015) for re-evaluation of cholesterol with me or Dr. Carlota Raspberry.    Fara Chute, PA-C Physician Assistant-Certified [facility]

## 2015-05-10 NOTE — Patient Instructions (Addendum)
Stop the lipitor. Let's see if the body aches resolve. If they don't, restart the lipitor and increase the voltaren to twice each day.  If the body aches do resolve, re-challenge with the lipitor. If the aches come back, stop it again and let me know so we can try something else.  I will contact you with your lab results as soon as they are available.   If you have not heard from me in 2 weeks, please contact me.  The fastest way to get your results is to register for My Chart (see the instructions on the last page of this printout).

## 2015-05-10 NOTE — Progress Notes (Signed)
Subjective:    Patient ID: Angelica Sharp, female    DOB: [birth year], 49 y.o.   MRN: [id number]  Chief Complaint  Patient presents with  . Follow-up    blood work   . Cough    x 3-4 days   . Nasal Congestion   HPI Patient presents today for follow-up evaluation of kidney function related to NSAID therapy.  Currently takes 75 mg of Voltaren once at bedtime for chronic joint pain. Feels like it is not helping her pain at all. Wakes up several times in the night complaining of general body aches and soreness. Hands are swollen in the mornings and her back is still very stiff. Has been on Voltaren in the past with symptomatic relief of her arthritis, however she had to stop the medication d/t impaired kidney function. Denies weakness or numbness/tingling in her upper or lower extremities, no loss of bowel or bladder control.   Started on Lipitor 20 mg for HLD at her last visit 6 weeks ago.  Also complaining of nasal congestion x 4 days. Accompanied by productive cough, rhinorrhea, and watery, itchy eyes. Occasional night sweats and chills. Denies fever, N/V, HA, sore throat, sinus, or ear pain. No visual changes. Has tried Zyrtec in the past with good relief of her allergies.   Currently taking care of her 15-week-old grandson at home with her daughter and does not want to get either of them sick.    Review of Systems  Constitutional: Negative for fever, chills, activity change, appetite change and unexpected weight change.  HENT: Positive for congestion and rhinorrhea. Negative for ear pain, sinus pressure and sore throat.   Eyes: Positive for discharge (watery eyes) and itching. Negative for photophobia, pain and visual disturbance.  Respiratory: Positive for cough (yellow-tinged sputum). Negative for shortness of breath and wheezing.   Cardiovascular: Negative for chest pain and palpitations.  Gastrointestinal: Negative for nausea, vomiting and abdominal pain.  Musculoskeletal: Positive  for myalgias (generalized body aches and soreness), back pain, joint swelling (hands) and arthralgias.  Allergic/Immunologic: Positive for environmental allergies.  Neurological: Negative for weakness and headaches.   Patient Active Problem List   Diagnosis Date Noted  . Chronic joint pain 03/29/2015  . DM II (diabetes mellitus, type II), controlled ([place]) 10/01/2014  . Obesity 03/12/2014  . 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 Hyperlipidemia 09/10/2013  . Inconclusive mammogram 08/31/2013  . Family history of cancer 08/30/2012  . GERD (gastroesophageal reflux disease) 07/03/2011  . COUGH 04/18/2010  . EDEMA 01/24/2010  . CHEST PAIN 01/24/2010   Family History  Problem Relation Age of Onset  . Heart attack Mother   . Diabetes Mother   . Hyperlipidemia Mother   . Hypertension Mother   . Heart attack Sister   . Pulmonary embolism Father   . Clotting disorder Father   . Diabetes Sister   . Kidney disease Sister     kidney failure  . Heart disease Sister   . Cancer Sister   . Diabetes Son    Social History   Social History  . Marital Status: Divorced    Spouse Name: N/A  . Number of Children: N/A  . Years of Education: N/A   Occupational History  . Mortgage    Social History Main Topics  . Smoking status: Never Smoker   . Smokeless tobacco: Not on file  . Alcohol Use: No  . Drug Use: No  . Sexual Activity: No   Other Topics Concern  .  Not on file   Social History Narrative   Prior to Admission medications   Medication Sig Start Date End Date Taking? Authorizing Provider  ALPRAZolam Duanne Moron) 0.25 MG tablet Take 1 tablet daily by mouth prn anxiety. 10/01/14  Yes Barton Fanny, MD  aspirin 81 MG tablet Take 81 mg by mouth daily.   Yes Historical Provider, MD  atorvastatin (LIPITOR) 20 MG tablet Take 1 tablet (20 mg total) by mouth daily. 04/01/15  Yes Chelle Jeffery, PA-C  calcium-vitamin D (OSCAL WITH D) 500-200 MG-UNIT per tablet Take 1 tablet by  mouth.   Yes Historical Provider, MD  Cholecalciferol (VITAMIN D PO) Take 1 capsule by mouth daily.   Yes Historical Provider, MD  glucose blood test strip Use as instructed 03/15/14  Yes Chelle Jeffery, PA-C  hydrochlorothiazide (HYDRODIURIL) 25 MG tablet TAKE 1 TABLET (25 MG TOTAL) BY MOUTH DAILY. 03/29/15  Yes Chelle Jeffery, PA-C  metFORMIN (GLUCOPHAGE) 500 MG tablet Take one tablet twice a day with meals. 10/01/14  Yes Barton Fanny, MD  pantoprazole (PROTONIX) 40 MG tablet Take 1 tablet (40 mg total) by mouth daily. 02/18/15  Yes Jaynee Eagles, PA-C  polyethylene glycol (MIRALAX / GLYCOLAX) packet Take 17 g by mouth daily.   Yes Historical Provider, MD  KRILL OIL PO Take by mouth.    Historical Provider, MD   Allergies  Allergen Reactions  . Morphine And Related     Headache and hallucinations  . Septra [Sulfamethoxazole-Trimethoprim]     Increased heartrate      Objective:   Physical Exam  Constitutional: She is oriented to person, place, and time. She appears well-developed and well-nourished. No distress.  HENT:  Head: Normocephalic and atraumatic.  Right Ear: External ear normal.  Left Ear: External ear normal.  Nose: Nose normal.  Mouth/Throat: Oropharynx is clear and moist. No oropharyngeal exudate.  Eyes: Conjunctivae and EOM are normal. No scleral icterus.  Neck: Neck supple.  Cardiovascular: Normal rate, regular rhythm and normal heart sounds.  Exam reveals no gallop and no friction rub.   No murmur heard. 2+ DP and PT pulses b/l.   Pulmonary/Chest: Effort normal and breath sounds normal. No respiratory distress. She has no wheezes. She has no rales.  Lymphadenopathy:    She has no cervical adenopathy.  Neurological: She is alert and oriented to person, place, and time.  Skin: Skin is warm and dry. She is not diaphoretic.  Psychiatric: She has a normal mood and affect. Her behavior is normal. Judgment and thought content normal.   BP 115/78 mmHg  Pulse 78   Temp(Src) 98.6 F (37 C) (Oral)  Resp 16  Ht 5' 7.75" (1.721 m)  Wt 206 lb (93.441 kg)  BMI 31.55 kg/m2  SpO2 96%  LMP 08/10/2012     Assessment & Plan:  1. Medication monitoring encounter - Awaiting lab results. - Basic metabolic panel  2. Chronic joint pain - Continue Voltaren 75 mg once at bedtime. Patient instructed to stop Lipitor. If the body aches do not resolve, restart the Lipitor and increase the Voltaren to 75 mg BID. If the body aches do improve off Lipitor, restart the Lipitor, and if they come back, patient will stop the Lipitor and another cholesterol medication will be considered.   3. Cough - Recommend OTC Zyrtec for relief of allergies.  - benzonatate (TESSALON) 100 MG capsule; Take 1-2 capsules (100-200 mg total) by mouth 3 (three) times daily as needed for cough.  Dispense: 40 capsule; Refill:  0  4. Controlled type 2 diabetes mellitus without complication, without long-term current use of insulin (HCC) - glucose blood test strip; Use as instructed  Dispense: 100 each; Refill: 3

## 2015-05-11 LAB — BASIC METABOLIC PANEL
BUN: 11 mg/dL (ref 7–25)
CALCIUM: 9.3 mg/dL (ref 8.6–10.4)
CHLORIDE: 99 mmol/L (ref 98–110)
CO2: 26 mmol/L (ref 20–31)
CREATININE: 1.01 mg/dL (ref 0.50–1.05)
Glucose, Bld: 65 mg/dL (ref 65–99)
Potassium: 4 mmol/L (ref 3.5–5.3)
Sodium: 138 mmol/L (ref 135–146)

## 2015-05-26 ENCOUNTER — Encounter: Payer: Self-pay | Admitting: Physician Assistant

## 2015-05-26 DIAGNOSIS — E119 Type 2 diabetes mellitus without complications: Secondary | ICD-10-CM

## 2015-05-26 MED ORDER — MELOXICAM 15 MG PO TABS: 15.0000 mg | ORAL_TABLET | Freq: Every day | ORAL | Status: DC

## 2015-05-30 ENCOUNTER — Other Ambulatory Visit: Payer: Self-pay

## 2015-05-30 DIAGNOSIS — R05 Cough: Secondary | ICD-10-CM

## 2015-05-30 DIAGNOSIS — R059 Cough, unspecified: Secondary | ICD-10-CM

## 2015-05-30 DIAGNOSIS — E119 Type 2 diabetes mellitus without complications: Secondary | ICD-10-CM

## 2015-05-30 NOTE — Telephone Encounter (Signed)
Please call this patient's pharmacy.  The orders were sent, but apparently not received.  Please let her know when it's been done.

## 2015-06-01 ENCOUNTER — Other Ambulatory Visit: Payer: Self-pay | Admitting: Urgent Care

## 2015-06-02 NOTE — Telephone Encounter (Signed)
Angelica Sharp, pt has been in recently for check up, but don't see GERD addressed. OK to give RFs?

## 2015-06-02 NOTE — Telephone Encounter (Signed)
Meds ordered this encounter  Medications  . pantoprazole (PROTONIX) 40 MG tablet    Sig: TAKE 1 TABLET (40 MG TOTAL) BY MOUTH DAILY.    Dispense:  90 tablet    Refill:  1

## 2015-06-20 ENCOUNTER — Telehealth: Payer: Self-pay

## 2015-06-20 DIAGNOSIS — R609 Edema, unspecified: Secondary | ICD-10-CM

## 2015-06-20 MED ORDER — ALPRAZOLAM 0.25 MG PO TABS: 0.2500 mg | ORAL_TABLET | Freq: Every day | ORAL | Status: DC | PRN

## 2015-06-20 MED ORDER — HYDROCHLOROTHIAZIDE 25 MG PO TABS: ORAL_TABLET | ORAL | Status: DC

## 2015-06-20 MED ORDER — ESOMEPRAZOLE MAGNESIUM 40 MG PO CPDR: 40.0000 mg | DELAYED_RELEASE_CAPSULE | Freq: Every day | ORAL | Status: DC

## 2015-06-20 MED ORDER — ALPRAZOLAM 0.25 MG PO TABS
ORAL_TABLET | ORAL | Status: DC
Start: 2015-06-20 — End: 2016-02-15

## 2015-06-20 NOTE — Telephone Encounter (Signed)
Patient is calling to request a 90 day supply for alprazolam, hydrochlorothiazide, and generic Nexium. Patient states she was was prescribed pantoprazole as a cheaper alternative to Nexium and it's not working. She would like to see if she can try generic Nexium instead.

## 2015-06-20 NOTE — Telephone Encounter (Signed)
Meds ordered this encounter  Medications  . ALPRAZolam (XANAX) 0.25 MG tablet    Sig: Take 1 tablet daily by mouth prn anxiety.    Dispense:  30 tablet    Refill:  0    May fill 60 days after date on prescription    Order Specific Question:  Supervising Provider    Answer:  Laney Pastor, ROBERT P [id number]  . ALPRAZolam (XANAX) 0.25 MG tablet    Sig: Take 1 tablet (0.25 mg total) by mouth daily as needed for anxiety.    Dispense:  30 tablet    Refill:  0    May fill 30 days after date on prescription    Order Specific Question:  Supervising Provider    Answer:  DOOLITTLE, ROBERT P [id number]  . ALPRAZolam (XANAX) 0.25 MG tablet    Sig: Take 1 tablet (0.25 mg total) by mouth daily as needed for anxiety.    Dispense:  30 tablet    Refill:  0    Order Specific Question:  Supervising Provider    Answer:  DOOLITTLE, ROBERT P [id number]  . hydrochlorothiazide (HYDRODIURIL) 25 MG tablet    Sig: TAKE 1 TABLET (25 MG TOTAL) BY MOUTH DAILY.    Dispense:  90 tablet    Refill:  3    Order Specific Question:  Supervising Provider    Answer:  DOOLITTLE, ROBERT P [id number]  . esomeprazole (NEXIUM) 40 MG capsule    Sig: Take 1 capsule (40 mg total) by mouth daily at 12 noon.    Dispense:  90 capsule    Refill:  3    Order Specific Question:  Supervising Provider    Answer:  DOOLITTLE, ROBERT P [id number]

## 2015-06-21 NOTE — Telephone Encounter (Signed)
LMOM to notify Rxs sent to pharm.

## 2015-08-04 ENCOUNTER — Encounter: Payer: Self-pay | Admitting: Physician Assistant

## 2015-08-04 DIAGNOSIS — E119 Type 2 diabetes mellitus without complications: Secondary | ICD-10-CM

## 2015-09-06 ENCOUNTER — Ambulatory Visit: Payer: BLUE CROSS/BLUE SHIELD | Admitting: Physician Assistant

## 2015-09-13 ENCOUNTER — Ambulatory Visit: Payer: Self-pay | Admitting: Physician Assistant

## 2015-09-13 IMAGING — CT CT ABD-PELV W/ CM
2 of 5 series · 16 of 46 positions shown, 18 images · IV contrast (omnipaque)
Comparison: CT 04/04/2011

CLINICAL DATA: History of Diverticulitis. Abdominal tenderness.
Swelling of the abdomen for two weeks.

EXAM:
CT ABDOMEN AND PELVIS WITH CONTRAST
TECHNIQUE: Multidetector CT imaging of the abdomen and pelvis was performed
using the standard protocol following bolus administration of
intravenous contrast.
CONTRAST:  100mL OMNIPAQUE IOHEXOL 300 MG/ML  SOLN

[Series 2: abd/ pelvis 5.0 i30f 1 · axial · 0.91mm/px · z∈[+394,+869]mm · 13 of 107 slices shown, 15 images]
[im 6/107  soft-tissue]
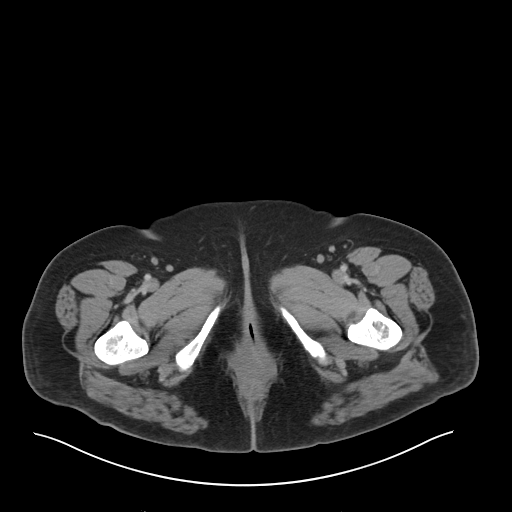
[im 6/107  bone]
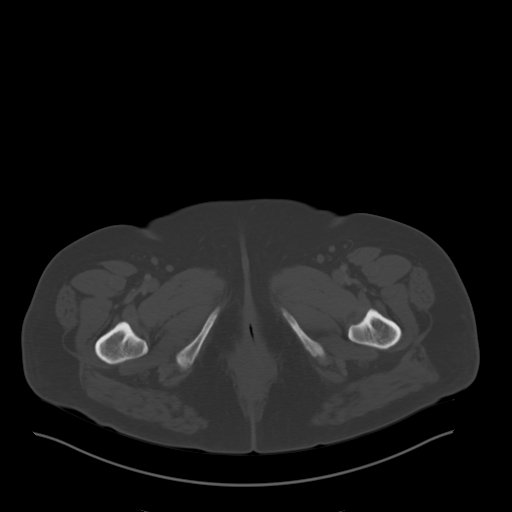
[im 12/107  soft-tissue]
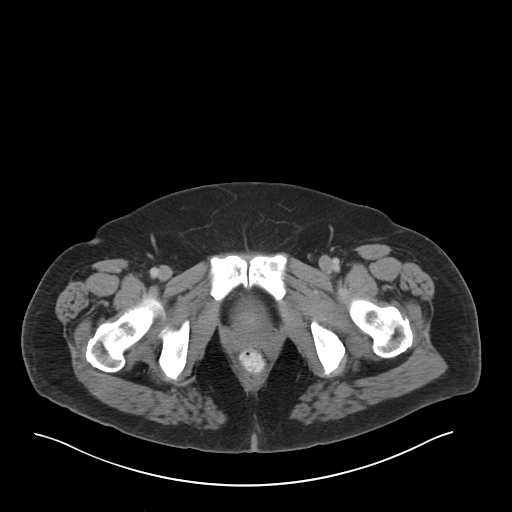
[im 24/107  soft-tissue]
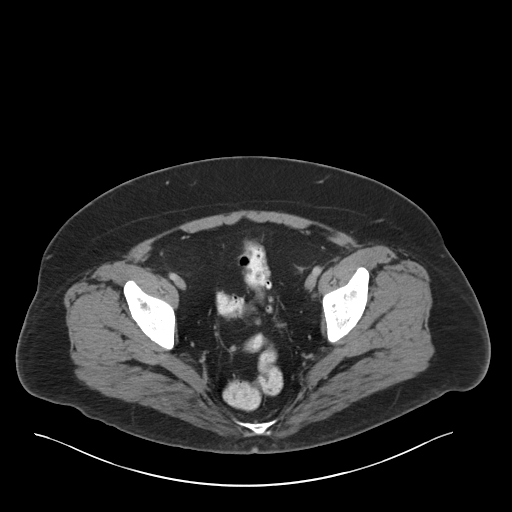
[im 30/107  soft-tissue]
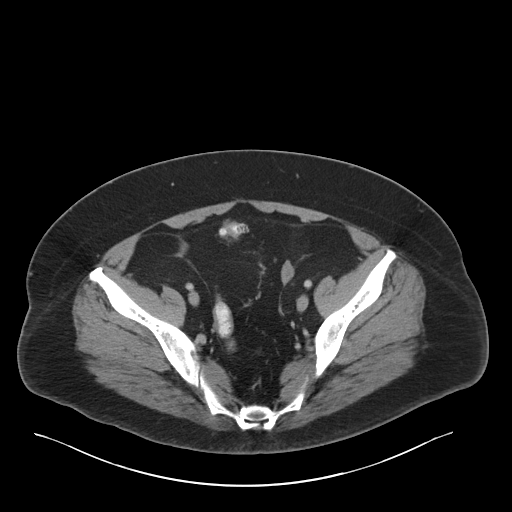
[im 36/107  soft-tissue]
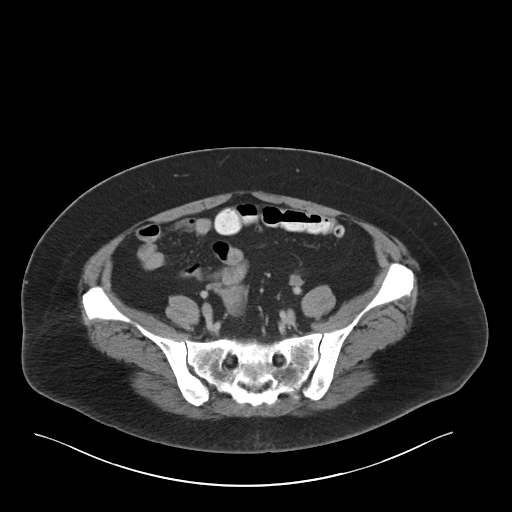
[im 48/107  soft-tissue]
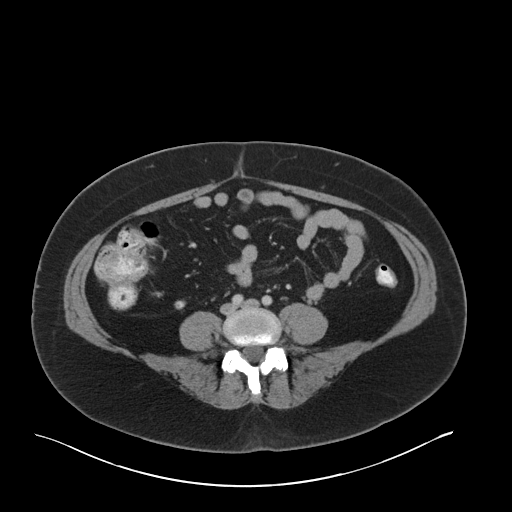
[im 54/107  soft-tissue]
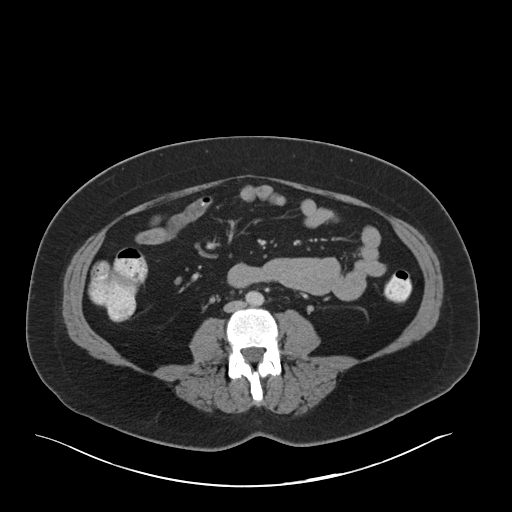
[im 59/107  soft-tissue]
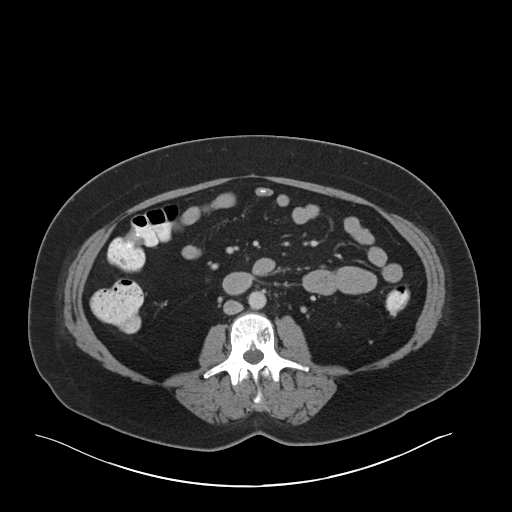
[im 71/107  soft-tissue]
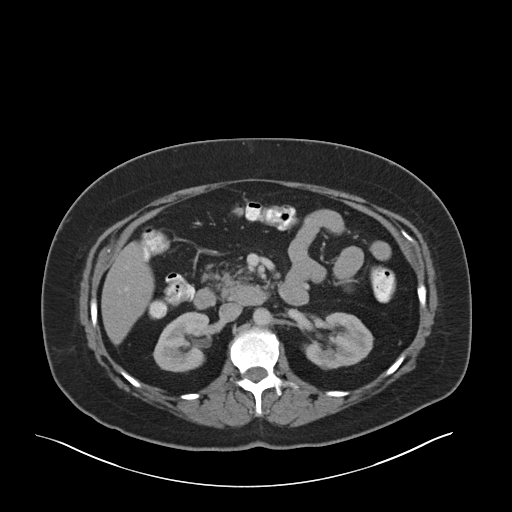
[im 71/107  bone]
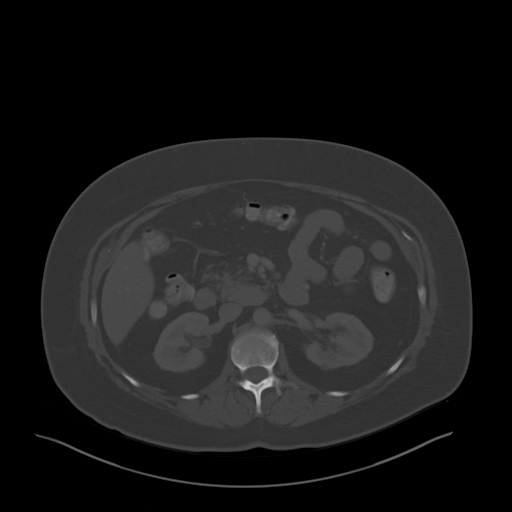
[im 77/107  soft-tissue]
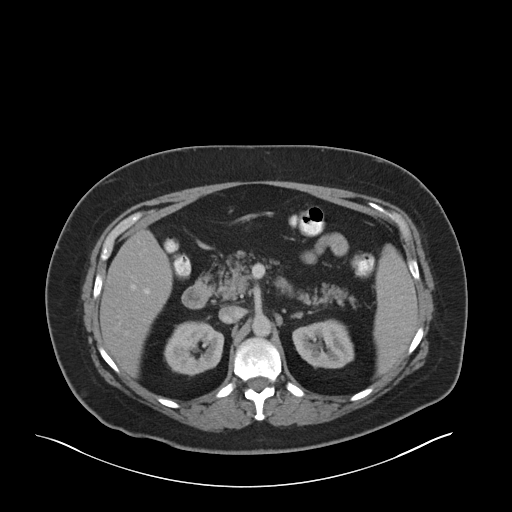
[im 83/107  soft-tissue]
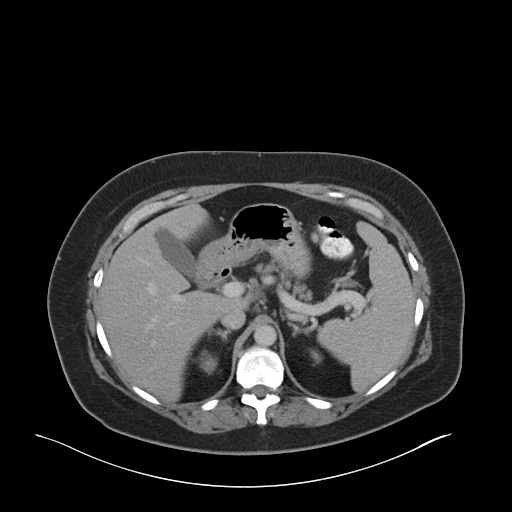
[im 95/107  soft-tissue]
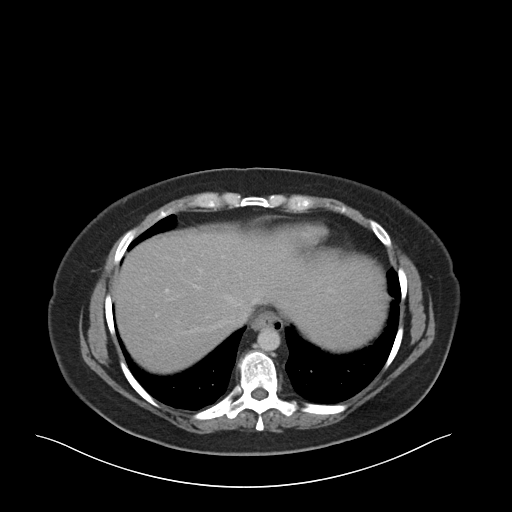
[im 101/107  soft-tissue]
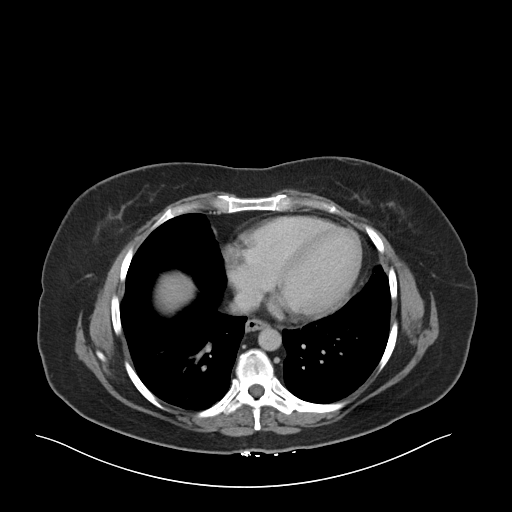

[Series 5: coronals · coronal · 0.90mm/px · 3 of 142 slices shown]
[im 48/142  soft-tissue]
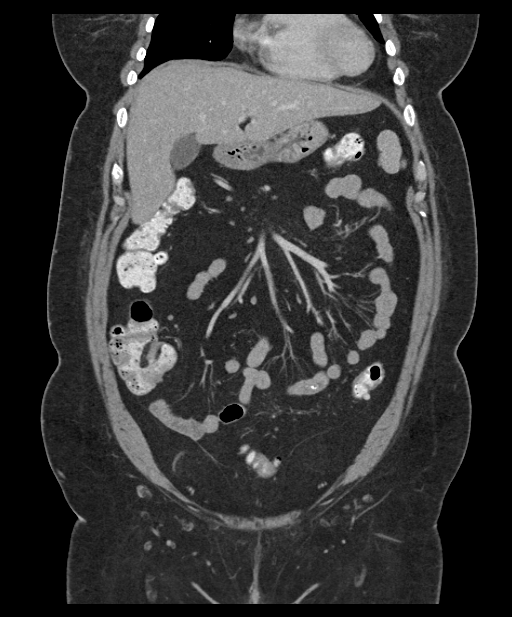
[im 63/142  soft-tissue]
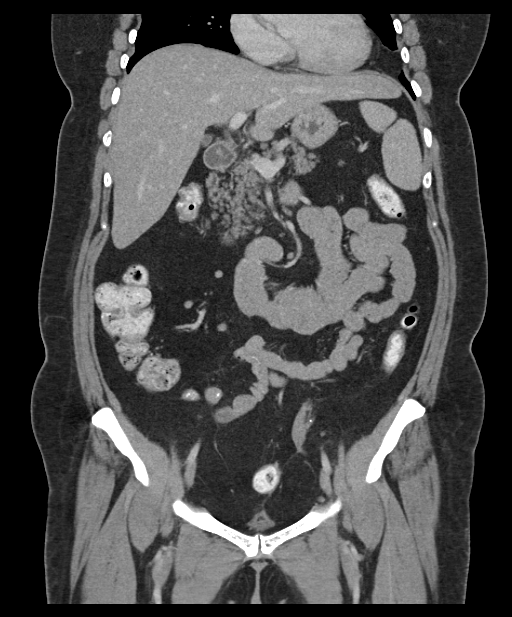
[im 79/142  soft-tissue]
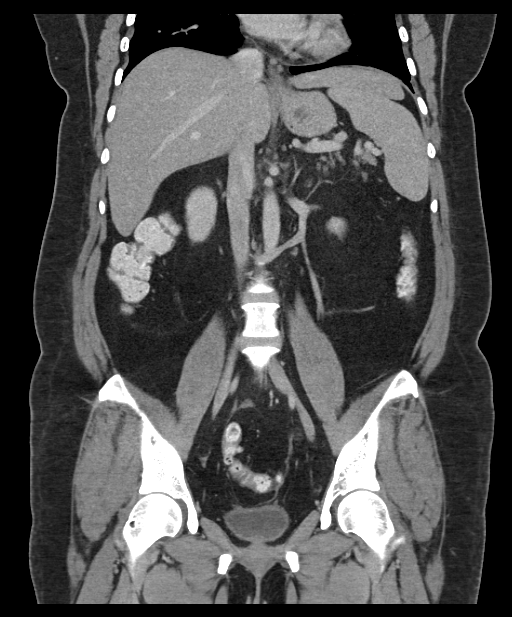

[16 of 46 positions shown; findings below may reference images not displayed]

FINDINGS: Lower chest:  Lung bases are clear.

Hepatobiliary: Small hypodense lesions in the left lateral hepatic
lobe is unchanged consistent with benign cyst. Similar even smaller
lesions in the right hepatic lobe on image 22. No biliary duct
dilatation. Gallbladder is normal.

Pancreas: Pancreas is normal. No ductal dilatation. No pancreatic
inflammation.

Spleen: Normal spleen.

Adrenals/urinary tract: Adrenal glands are normal. Kidneys and
ureters are normal. The bladder is normal.

Stomach/Bowel: Stomach, small bowel, appendix, cecum are normal.
There are diverticula of the descending colon sigmoid colon. No
evidence of acute inflammation of the sigmoid colon.

Vascular/Lymphatic: Abdominal aorta is normal caliber. There is no
retroperitoneal or periportal lymphadenopathy. No pelvic
lymphadenopathy.

Reproductive: Post hysterectomy anatomy. No free fluid the pelvis.
Ovaries are normal.

Musculoskeletal: No aggressive osseous lesion.

Other: No free-fluid or inflammation.  No abscess
IMPRESSION: 1. Sigmoid diverticulosis without evidence of diverticulitis.
2. Normal appendix.

## 2015-10-04 LAB — HM MAMMOGRAPHY

## 2015-10-12 ENCOUNTER — Encounter: Payer: Self-pay | Admitting: Family Medicine

## 2016-01-12 LAB — HM DIABETES EYE EXAM

## 2016-01-13 ENCOUNTER — Telehealth: Payer: Self-pay

## 2016-01-13 NOTE — Telephone Encounter (Signed)
PA completed for esomeprazole on covermymeds. Pt has tried both omeprazole and pantoprazole in the past and esomeprazole started 09/10/13 controls her Sxs better. PA pending. [facility] provided [employer] ID # [id number].

## 2016-01-16 NOTE — Telephone Encounter (Signed)
PA was denied on covermymeds. I called [employer] to clarify reason for denial. Was advised that both the generic and name brand Nexium are plan exclusions. The only two PPIs on formulary are omeprazole and pantoprazole. Chelle, do you want to put pt back on one of these?

## 2016-01-17 NOTE — Telephone Encounter (Signed)
I would contact the patient and let her know that she needs to call her insurance as she has failed both of the medications that they want her on and what she can do to stay on a medication that is controlling her symptoms.

## 2016-01-18 ENCOUNTER — Telehealth: Payer: Self-pay | Admitting: Emergency Medicine

## 2016-01-18 NOTE — Telephone Encounter (Signed)
Pt will call pharmacy to find out which medication preferred to control symptoms  Instructed her to call us after she finds out.

## 2016-01-28 ENCOUNTER — Encounter: Payer: Self-pay | Admitting: Physician Assistant

## 2016-02-15 ENCOUNTER — Encounter (HOSPITAL_COMMUNITY): Payer: Self-pay | Admitting: Emergency Medicine

## 2016-02-15 ENCOUNTER — Emergency Department (HOSPITAL_COMMUNITY)
Admission: EM | Admit: 2016-02-15 | Discharge: 2016-02-15 | Disposition: A | Discharge status: Home / Self Care | Payer: 59 | Attending: Emergency Medicine | Admitting: Emergency Medicine

## 2016-02-15 ENCOUNTER — Emergency Department (HOSPITAL_COMMUNITY): Payer: 59

## 2016-02-15 DIAGNOSIS — Z79899 Other long term (current) drug therapy: Secondary | ICD-10-CM | POA: Diagnosis not present

## 2016-02-15 DIAGNOSIS — E119 Type 2 diabetes mellitus without complications: Secondary | ICD-10-CM | POA: Diagnosis not present

## 2016-02-15 DIAGNOSIS — M546 Pain in thoracic spine: Secondary | ICD-10-CM | POA: Insufficient documentation

## 2016-02-15 DIAGNOSIS — I10 Essential (primary) hypertension: Secondary | ICD-10-CM | POA: Insufficient documentation

## 2016-02-15 DIAGNOSIS — N390 Urinary tract infection, site not specified: Secondary | ICD-10-CM | POA: Diagnosis not present

## 2016-02-15 DIAGNOSIS — Z7984 Long term (current) use of oral hypoglycemic drugs: Secondary | ICD-10-CM | POA: Insufficient documentation

## 2016-02-15 DIAGNOSIS — Z7982 Long term (current) use of aspirin: Secondary | ICD-10-CM | POA: Diagnosis not present

## 2016-02-15 DIAGNOSIS — R0602 Shortness of breath: Secondary | ICD-10-CM

## 2016-02-15 LAB — URINE MICROSCOPIC-ADD ON: RBC / HPF: NONE SEEN RBC/hpf (ref 0–5)

## 2016-02-15 LAB — BASIC METABOLIC PANEL
Anion gap: 10 (ref 5–15)
BUN: 17 mg/dL (ref 6–20)
CALCIUM: 9.3 mg/dL (ref 8.9–10.3)
CO2: 27 mmol/L (ref 22–32)
CREATININE: 1.08 mg/dL — AB (ref 0.44–1.00)
Chloride: 101 mmol/L (ref 101–111)
GFR calc Af Amer: >60 mL/min (ref >60)
GFR, EST NON AFRICAN AMERICAN: 58 mL/min — AB (ref >60)
GLUCOSE: 102 mg/dL — AB (ref 65–99)
Potassium: 3.7 mmol/L (ref 3.5–5.1)
SODIUM: 138 mmol/L (ref 135–145)

## 2016-02-15 LAB — URINALYSIS, ROUTINE W REFLEX MICROSCOPIC
GLUCOSE, UA: NEGATIVE mg/dL
HGB URINE DIPSTICK: NEGATIVE
Ketones, ur: NEGATIVE mg/dL
Nitrite: POSITIVE — AB
PROTEIN: NEGATIVE mg/dL
Specific Gravity, Urine: 1.023 (ref 1.005–1.030)
pH: 5.5 (ref 5.0–8.0)

## 2016-02-15 LAB — CBC
HEMATOCRIT: 38.3 % (ref 36.0–46.0)
Hemoglobin: 12.2 g/dL (ref 12.0–15.0)
MCH: 25.1 pg — ABNORMAL LOW (ref 26.0–34.0)
MCHC: 31.9 g/dL (ref 30.0–36.0)
MCV: 78.6 fL (ref 78.0–100.0)
PLATELETS: 375 10*3/uL (ref 150–400)
RBC: 4.87 MIL/uL (ref 3.87–5.11)
RDW: 14.1 % (ref 11.5–15.5)
WBC: 10.7 10*3/uL — AB (ref 4.0–10.5)

## 2016-02-15 LAB — I-STAT TROPONIN, ED
TROPONIN I, POC: 0 ng/mL (ref 0.00–0.08)
Troponin i, poc: 0 ng/mL (ref 0.00–0.08)

## 2016-02-15 LAB — D-DIMER, QUANTITATIVE: D-Dimer, Quant: 0.33 ug/mL-FEU (ref 0.00–0.50)

## 2016-02-15 MED ORDER — CEPHALEXIN 500 MG PO CAPS: 500.0000 mg | ORAL_CAPSULE | Freq: Three times a day (TID) | ORAL | 0 refills | Status: DC

## 2016-02-15 MED ORDER — ASPIRIN 81 MG PO CHEW
324.0000 mg | CHEWABLE_TABLET | Freq: Once | ORAL | Status: AC
  Administered 2016-02-15: 324 mg via ORAL
  Filled 2016-02-15: qty 4

## 2016-02-15 NOTE — Discharge Instructions (Signed)
We will treat you for a UTI with a course of antibiotics. The rest of your labs were unremarkable. Please follow up with your primary care provider and cardiologist as soon as possible.

## 2016-02-15 NOTE — ED Triage Notes (Signed)
Pt reports pain between her shoulder blades and feeling of SOB since last night. Pt has also been having dysuria at home and was taking AZO. Went to an UC for this, this am. EKG and urine sample done at [facility]. Pt was told urine sample was inconclusive as she had been on AZO. Pt has a hx of abnormal EKGs and sees cardiology for this. UC could not compare current EKG to old one, so was told to go to the ED for further eval.

## 2016-02-15 NOTE — ED Provider Notes (Signed)
[facility] DEPT Provider Note   CSN: [id number] Arrival date & time: 02/15/16  1114     History   Chief Complaint Chief Complaint  Patient presents with  . Shoulder Pain  . Shortness of Breath  . Dysuria    HPI   Angelica Sharp is an 50 y.o. female with history of abnormal EKG ("I have T wave inversions"), HTN, HLD, DM who presents to the ED for evaluation of shortness of breath and thoracic back pain. She states she was in her usual state of health until this morning when she woke from sleep and noticed a "pressure" in between her shoulder blades. She states today she has also intermittently felt short of breath. The SOB is worse with exertion. She states today has intermittently had a few episodes of dull chest pain that resolved on its own. Denies new leg swelling or leg pain. [place]. Does not smoke. No OCP or exogenous hormone therapy. Positive fam hx of blood clots; pt's father died from a PE. She has not tried anything to alleviate her symptoms.  Pt also reports symptoms of a UTI. She states she gets UTIs frequently. Has had dysuria and bladder pressure for the past couple of days. Endorses urinary frequency as well. Has tried AZO with no relief. She went to urgent care today who sent her to the ED for abnormal EKG. They apparently also did a UA but due to her AZO use the urinalysis was inconclusive.  Past Medical History:  Diagnosis Date  . Allergy   . Anemia   . Anxiety   . Arthritis   . Borderline hypertension   . Chest pain   . Degenerative lumbar disc    L5S1  . Diabetes mellitus without complication ([place])   . Diverticulitis   . Edema    lower extremity  . Family history of anesthesia complication    sister had BP complications during anes.  Marland Kitchen GERD (gastroesophageal reflux disease)   . H/O hiatal hernia   . Hypertension   . SOB (shortness of breath)     Patient Active Problem List   Diagnosis Date Noted  .  Chronic joint pain 03/29/2015  . DM II (diabetes mellitus, type II), controlled ([place]) 10/01/2014  . Obesity 03/12/2014  . 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 Hyperlipidemia 09/10/2013  . Inconclusive mammogram 08/31/2013  . Family history of cancer 08/30/2012  . GERD (gastroesophageal reflux disease) 07/03/2011  . COUGH 04/18/2010  . EDEMA 01/24/2010  . CHEST PAIN 01/24/2010    Past Surgical History:  Procedure Laterality Date  . ABDOMINAL HYSTERECTOMY    . BREAST LUMPECTOMY     3 lumps -- two from right -- one from left  . LAPAROSCOPIC ASSISTED VAGINAL HYSTERECTOMY N/A 09/18/2012   Procedure: LAPAROSCOPIC ASSISTED VAGINAL HYSTERECTOMY;  Surgeon: Linda Hedges, DO;  Location: [facility];  Service: Gynecology;  Laterality: N/A;  BLADDER  DISTENTION  . TUBAL LIGATION      OB History    No data available       Home Medications    Prior to Admission medications   Medication Sig Start Date End Date Taking? Authorizing Provider  ALPRAZolam (XANAX) 0.25 MG tablet Take 1 tablet (0.25 mg total) by mouth daily as needed for anxiety. Patient taking differently: Take 0.125-0.25 mg by mouth daily as needed for anxiety.  06/20/15  Yes Chelle Jeffery, PA-C  aspirin EC 81 MG tablet Take 81 mg by  mouth daily.   Yes Historical Provider, MD  Cholecalciferol (VITAMIN D) 2000 units tablet Take 2,000 Units by mouth daily.   Yes Historical Provider, MD  Dulaglutide (TRULICITY) A999333 [id number] SOPN Inject 0.75 mg into the skin every Sunday.   Yes Historical Provider, MD  esomeprazole (NEXIUM 24HR CLEAR MINIS) 20 MG capsule Take 40 mg by mouth daily.   Yes Historical Provider, MD  glucose blood test strip Use as instructed 05/10/15  Yes Chelle Jeffery, PA-C  hydrochlorothiazide (HYDRODIURIL) 25 MG tablet TAKE 1 TABLET (25 MG TOTAL) BY MOUTH DAILY. 06/20/15  Yes Chelle Jeffery, PA-C  metFORMIN (GLUCOPHAGE-XR) 750 MG 24 hr tablet Take 750 mg by mouth 2 (two) times daily.   Yes Historical Provider, MD    naproxen sodium (ANAPROX) 220 MG tablet Take 220-440 mg by mouth daily.   Yes Historical Provider, MD  phenazopyridine (AZO-STANDARD) 95 MG tablet Take 190 mg by mouth 3 (three) times daily as needed for pain.   Yes Historical Provider, MD  polyethylene glycol (MIRALAX / GLYCOLAX) packet Take 17 g by mouth daily as needed for mild constipation.    Yes Historical Provider, MD  pravastatin (PRAVACHOL) 40 MG tablet Take 40 mg by mouth daily.   Yes Historical Provider, MD  atorvastatin (LIPITOR) 20 MG tablet Take 1 tablet (20 mg total) by mouth daily. Patient not taking: Reported on 02/15/2016 04/01/15   Harrison Mons, PA-C  esomeprazole (NEXIUM) 40 MG capsule Take 1 capsule (40 mg total) by mouth daily at 12 noon. Patient not taking: Reported on 02/15/2016 06/20/15   Harrison Mons, PA-C  meloxicam (MOBIC) 15 MG tablet Take 1 tablet (15 mg total) by mouth daily. Patient not taking: Reported on 02/15/2016 05/26/15   Harrison Mons, PA-C  metFORMIN (GLUCOPHAGE) 500 MG tablet Take one tablet twice a day with meals. Patient not taking: Reported on 02/15/2016 10/01/14   Barton Fanny, MD    Family History Family History  Problem Relation Age of Onset  . Heart attack Mother   . Diabetes Mother   . Hyperlipidemia Mother   . Hypertension Mother   . Heart attack Sister   . Pulmonary embolism Father   . Clotting disorder Father   . Diabetes Sister   . Kidney disease Sister     kidney failure  . Heart disease Sister   . Cancer Sister   . Diabetes Son 2    TYPE 1    Social History Social History  Substance Use Topics  . Smoking status: Never Smoker  . Smokeless tobacco: Not on file  . Alcohol use No     Allergies   Morphine and related and Septra [sulfamethoxazole-trimethoprim]   Review of Systems Review of Systems 10 Systems reviewed and are negative for acute change except as noted in the HPI.  Physical Exam Updated Vital Signs BP 131/77   Pulse 95   Temp 98.2 F (36.8 C)  (Oral)   Resp 18   LMP 08/10/2012   SpO2 100%   Physical Exam  Constitutional: She is oriented to person, place, and time.  HENT:  Right Ear: External ear normal.  Left Ear: External ear normal.  Nose: Nose normal.  Mouth/Throat: Oropharynx is clear and moist. No oropharyngeal exudate.  Eyes: Conjunctivae are normal.  Neck: Neck supple.  Cardiovascular: Normal rate, regular rhythm, normal heart sounds and intact distal pulses.   Pulmonary/Chest: Effort normal and breath sounds normal. No respiratory distress. She has no wheezes.  Abdominal: Soft. Bowel sounds are normal.  She exhibits no distension. There is no tenderness. There is no rebound and no guarding.  No CVA tenderness  Musculoskeletal: She exhibits no edema.  No LE edema No calf tenderness 2+ peripheral pulses throughout  Lymphadenopathy:    She has no cervical adenopathy.  Neurological: She is alert and oriented to person, place, and time. No cranial nerve deficit.  Skin: Skin is warm and dry.  Psychiatric: She has a normal mood and affect.  Nursing note and vitals reviewed.    ED Treatments / Results  Labs (all labs ordered are listed, but only abnormal results are displayed) Labs Reviewed  BASIC METABOLIC PANEL - Abnormal; Notable for the following:       Result Value   Glucose, Bld 102 (*)    Creatinine, Ser 1.08 (*)    GFR calc non Af Amer 58 (*)    All other components within normal limits  CBC - Abnormal; Notable for the following:    WBC 10.7 (*)    MCH 25.1 (*)    All other components within normal limits  URINALYSIS, ROUTINE W REFLEX MICROSCOPIC (NOT AT [facility]) - Abnormal; Notable for the following:    Color, Urine ORANGE (*)    APPearance CLOUDY (*)    Bilirubin Urine SMALL (*)    Nitrite POSITIVE (*)    Leukocytes, UA SMALL (*)    All other components within normal limits  URINE MICROSCOPIC-ADD ON - Abnormal; Notable for the following:    Squamous Epithelial / LPF 6-30 (*)    Bacteria, UA MANY  (*)    All other components within normal limits  URINE CULTURE  D-DIMER, QUANTITATIVE (NOT AT [facility])  I-STAT TROPOININ, ED  I-STAT TROPOININ, ED    EKG  EKG Interpretation  Date/Time:  Wednesday February 15 2016 11:22:44 EDT Ventricular Rate:  86 PR Interval:    QRS Duration: 95 QT Interval:  418 QTC Calculation: 500 R Axis:   -40 Text Interpretation:  Sinus rhythm Borderline short PR interval Left anterior fascicular block T wave abnormality Baseline wander Abnormal ekg Confirmed by Carmin Muskrat  MD ([id number]) on 02/15/2016 11:38:40 AM       Radiology Dg Chest 2 View  Result Date: 02/15/2016 CLINICAL DATA:  Upper back pain associated with shortness of breath last night. History of diabetes, hypertension, nonsmoker. EXAM: CHEST  2 VIEW COMPARISON:  Chest x-ray of August 30, 2014 FINDINGS: The lungs are adequately inflated and clear. The heart and pulmonary vascularity are normal. The mediastinum is normal in width. There is no pleural effusion. The bony thorax exhibits no acute abnormality. There is mild multilevel degenerative disc disease of the thoracic spine. IMPRESSION: There is no active cardiopulmonary disease. Electronically Signed   By: David  Martinique M.D.   On: 02/15/2016 11:45    Procedures Procedures (including critical care time)  Medications Ordered in ED Medications  aspirin chewable tablet 324 mg (324 mg Oral Given 02/15/16 1728)     Initial Impression / Assessment and Plan / ED Course  I have reviewed the triage vital signs and the nursing notes.  Pertinent labs & imaging results that were available during my care of the patient were reviewed by me and considered in my medical decision making (see chart for details).  Clinical Course    HEART score 3. [place] 0. Delta troponin negative. D-dimer negative. EKG nonacute, CXR negative. Doubt ACS, PE. Doubt dissection. Encouraged close outpatient PCP and cardiology f/u. Her back pain might be musculoskeletal as she  does admit she has been taking care of her toddler grandson and has been lifting/carrying him a lot. Her UA is signficant for infection and we will treat with course of keflex. Sent for culture. Strict ER return precautions given.  Final Clinical Impressions(s) / ED Diagnoses   Final diagnoses:  Urinary tract infection without hematuria, site unspecified  Shortness of breath  Thoracic back pain, unspecified back pain laterality    New Prescriptions Discharge Medication List as of 02/15/2016  6:31 PM    START taking these medications   Details  cephALEXin (KEFLEX) 500 MG capsule Take 1 capsule (500 mg total) by mouth 3 (three) times daily., Starting Wed 02/15/2016, Print         Anne Ng, PA-C 02/16/16 0009    Davonna Belling, MD 02/16/16 [phone]

## 2016-02-15 NOTE — ED Notes (Signed)
Pt informed of need to provide urine sample.

## 2016-02-15 NOTE — ED Notes (Addendum)
Right Arm BP 121/47  Left Arm BP 109/73  PA made aware

## 2016-02-18 LAB — URINE CULTURE: Culture: >=100000 — AB

## 2016-02-19 ENCOUNTER — Telehealth (HOSPITAL_BASED_OUTPATIENT_CLINIC_OR_DEPARTMENT_OTHER): Payer: Self-pay

## 2016-02-19 NOTE — Telephone Encounter (Signed)
Post ED Visit - Positive Culture Follow-up  Culture report reviewed by antimicrobial stewardship pharmacist:  []  Elenor Quinones, Pharm.D. []  Heide Guile, Pharm.D., BCPS [x]  Parks Neptune, Pharm.D. []  Alycia Rossetti, Pharm.D., BCPS []  [place], Pharm.D., BCPS, AAHIVP []  Legrand Como, Pharm.D., BCPS, AAHIVP []  Milus Glazier, Pharm.D. []  Stephens November, Pharm.D.  Positive urine culture Treated with Cephalexin, organism sensitive to the same and no further patient follow-up is required at this time.  Genia Del 02/19/2016, 9:23 AM

## 2016-04-13 ENCOUNTER — Encounter: Payer: Self-pay | Admitting: Physician Assistant

## 2016-04-16 NOTE — Telephone Encounter (Signed)
Called hosp to verify that pt can call their Med Recs dept to req the results, in addition to checking her MyChart acct. Replied to pt.

## 2016-05-29 ENCOUNTER — Other Ambulatory Visit: Payer: Self-pay | Admitting: Physician Assistant

## 2016-05-29 DIAGNOSIS — R609 Edema, unspecified: Secondary | ICD-10-CM

## 2016-07-23 ENCOUNTER — Telehealth: Payer: Self-pay

## 2016-07-23 DIAGNOSIS — Z20828 Contact with and (suspected) exposure to other viral communicable diseases: Secondary | ICD-10-CM

## 2016-07-23 MED ORDER — OSELTAMIVIR PHOSPHATE 75 MG PO CAPS: 75.0000 mg | ORAL_CAPSULE | Freq: Every day | ORAL | 0 refills | Status: DC

## 2016-07-23 NOTE — Telephone Encounter (Addendum)
PATIENT STATES DR. Carlota Raspberry IS HER PCP. HER GRANDSON WAS DIAGNOSED WITH THE FLU AT HIS PEDIATRICIAN AND THEIR OFFICE SAID SHE SHOULD CALL HERE TO GET TAMIFLU CALLED INTO HER PHARMACY. (HER GRANDSON LIVES WITH HER). SHE IS A TYPE 2 DIABETIC. BEST PHONE [phone] (CELL) [place], [place]

## 2016-07-23 NOTE — Telephone Encounter (Signed)
Please advise 

## 2016-07-23 NOTE — Telephone Encounter (Signed)
Called patient. Grandson diagnosed with flu. She does have diabetes and would like to take Tamiflu for prevention. Possible side effects discussed. Tamiflu 75 mg daily for 10 days sent to her pharmacy.

## 2016-10-04 ENCOUNTER — Ambulatory Visit (INDEPENDENT_AMBULATORY_CARE_PROVIDER_SITE_OTHER): Payer: 59 | Admitting: Physician Assistant

## 2016-10-04 ENCOUNTER — Encounter: Payer: Self-pay | Admitting: Family Medicine

## 2016-10-04 ENCOUNTER — Ambulatory Visit (INDEPENDENT_AMBULATORY_CARE_PROVIDER_SITE_OTHER): Payer: 59

## 2016-10-04 VITALS — BP 126/78 | HR 89 | Temp 98.4°F | Resp 18 | Ht 67.32 in | Wt 213.0 lb

## 2016-10-04 DIAGNOSIS — M79672 Pain in left foot: Secondary | ICD-10-CM

## 2016-10-04 DIAGNOSIS — M62838 Other muscle spasm: Secondary | ICD-10-CM

## 2016-10-04 DIAGNOSIS — G479 Sleep disorder, unspecified: Secondary | ICD-10-CM | POA: Diagnosis not present

## 2016-10-04 DIAGNOSIS — E65 Localized adiposity: Secondary | ICD-10-CM

## 2016-10-04 LAB — POCT URINALYSIS DIP (MANUAL ENTRY)
Bilirubin, UA: NEGATIVE
Blood, UA: NEGATIVE
GLUCOSE UA: NEGATIVE mg/dL
Ketones, POC UA: NEGATIVE mg/dL
LEUKOCYTES UA: NEGATIVE
NITRITE UA: NEGATIVE
Protein Ur, POC: NEGATIVE mg/dL
SPEC GRAV UA: 1.015 (ref 1.010–1.025)
UROBILINOGEN UA: 0.2 U/dL
pH, UA: 5.5 (ref 5.0–8.0)

## 2016-10-04 LAB — POC MICROSCOPIC URINALYSIS (UMFC): MUCUS RE: ABSENT

## 2016-10-04 MED ORDER — ALPRAZOLAM 0.25 MG PO TABS: 0.1250 mg | ORAL_TABLET | Freq: Every evening | ORAL | 0 refills | Status: DC | PRN

## 2016-10-04 MED ORDER — CYCLOBENZAPRINE HCL 5 MG PO TABS: 5.0000 mg | ORAL_TABLET | Freq: Three times a day (TID) | ORAL | 0 refills | Status: DC | PRN

## 2016-10-04 NOTE — Progress Notes (Signed)
_  Angelica Sharp  MRN: [id number] DOB: 1966-02-18  Subjective:  Angelica Sharp is a 51 y.o. female seen in office today for a chief complaint of foot pain, knot on back, lumps in abdomen, and medication refill.  Knot on back: Pt reports that she has struggled from low back pain for years. Has hx of degenertaive disc disease. However, a few months ago she was bending over and noticed a bump on the right side. Had her friend evaluate it and they also noticed a bulge. She was worried because her sister had a kidney mass so she wanted to get it evaluated. Denies redness, warmth, fever, chills, and diaphoreiss. There has been no change in the bump. Has no hx of cysts.  Lumps in abdomen: Pt has noticed lumps throughout abdomen for the past few years. Notes they are sometimes tender. Denies changes in size, nausea, vomiting, diarrhea, and abdominal pain. Was once told by a provider that they were fatty tissue deposits but she just wanted a second opinion.   Foot pain: She has been having left foot pain x 1 day. Pain is worse with ambulation. Denies acute injury, swelling, redness, and decreased ROM. Has not tried anything for relief. She is concerned because she once had a right foot fracture without having a known injury so wanted to get it checked out. No hx gout.   Medication refill: Pt has been controlled on xanax  0.125- 0.25 mg once every week to two weeks for sleep for the past year.  Last Rx was filled by PA Chelle on 06/20/2015. She tolerates this medication well. Denies insomnia, irritability, confusion, and dizziness.   Review of Systems  Genitourinary: Positive for flank pain and frequency ( baseline for patient). Negative for difficulty urinating, dysuria and hematuria.    Patient Active Problem List   Diagnosis Date Noted  . Chronic joint pain 03/29/2015  . DM II (diabetes mellitus, type II), controlled ([place]) 10/01/2014  . Obesity 03/12/2014  . Diverticular disease of colon 09/11/2013   . Hyperlipidemia 09/10/2013  . Inconclusive mammogram 08/31/2013  . Family history of cancer 08/30/2012  . GERD (gastroesophageal reflux disease) 07/03/2011    Current Outpatient Prescriptions on File Prior to Visit  Medication Sig Dispense Refill  . aspirin EC 81 MG tablet Take 81 mg by mouth daily.    . Cholecalciferol (VITAMIN D) 2000 units tablet Take 2,000 Units by mouth daily.    . Dulaglutide (TRULICITY) 5.85 ID/7.8EU SOPN Inject 0.75 mg into the skin every Sunday.    . esomeprazole (NEXIUM) 40 MG capsule Take 1 capsule (40 mg total) by mouth daily at 12 noon. 90 capsule 3  . glucose blood test strip Use as instructed 100 each 3  . hydrochlorothiazide (HYDRODIURIL) 25 MG tablet TAKE 1 TABLET (25 MG TOTAL) BY MOUTH DAILY. 90 tablet 0  . metFORMIN (GLUCOPHAGE-XR) 750 MG 24 hr tablet Take 750 mg by mouth 2 (two) times daily.    . naproxen sodium (ANAPROX) 220 MG tablet Take 220-440 mg by mouth daily.    . polyethylene glycol (MIRALAX / GLYCOLAX) packet Take 17 g by mouth daily as needed for mild constipation.     . pravastatin (PRAVACHOL) 40 MG tablet Take 40 mg by mouth daily.    Marland Kitchen atorvastatin (LIPITOR) 20 MG tablet Take 1 tablet (20 mg total) by mouth daily. (Patient not taking: Reported on 02/15/2016) 90 tablet 3  . esomeprazole (NEXIUM 24HR CLEAR MINIS) 20 MG capsule Take 40 mg by mouth daily.    Marland Kitchen  metFORMIN (GLUCOPHAGE) 500 MG tablet Take one tablet twice a day with meals. (Patient not taking: Reported on 02/15/2016) 60 tablet 5  . [DISCONTINUED] calcium carbonate 200 MG capsule TWICE WEEKLY    . [DISCONTINUED] ranitidine (ZANTAC) 150 MG capsule Take 150 mg by mouth every evening.     No current facility-administered medications on file prior to visit.     Allergies  Allergen Reactions  . Morphine And Related     Headache and hallucinations  . Septra [Sulfamethoxazole-Trimethoprim]     Increased heartrate     Objective:  BP 126/78   Pulse 89   Temp 98.4 F (36.9 C)  (Oral)   Resp 18   Ht 5' 7.32" (1.71 m)   Wt 213 lb (96.6 kg)   LMP 08/10/2012   SpO2 95%   BMI 33.04 kg/m   Physical Exam  Constitutional: She is oriented to person, place, and time and well-developed, well-nourished, and in no distress.  HENT:  Head: Normocephalic and atraumatic.  Eyes: Conjunctivae are normal.  Neck: Normal range of motion.  Pulmonary/Chest: Effort normal.  Abdominal: Soft. Normal appearance and bowel sounds are normal. There is no tenderness. There is no CVA tenderness.  Multiple small fatty tissue collections palpated throughout upper abdominal cavity.   Musculoskeletal:       Thoracic back: She exhibits spasm (noted of right sided musculature). She exhibits normal range of motion, no tenderness and no bony tenderness.       Left foot: There is bony tenderness (of 1st metatarsal). There is normal range of motion, no swelling and normal capillary refill.  Neurological: She is alert and oriented to person, place, and time. Gait normal.  Skin: Skin is warm and dry.  Psychiatric: Affect normal.  Vitals reviewed.   Dg Foot Complete Left  Result Date: 10/04/2016 CLINICAL DATA:  Foot pain for 1 day EXAM: LEFT FOOT - COMPLETE 3+ VIEW COMPARISON:  01/13/2010 ankle series. FINDINGS: Early degenerative changes at the first MTP joint. Plantar calcaneal spur. No acute bony abnormality. Specifically, no fracture, subluxation, or dislocation. Soft tissues are intact. IMPRESSION: No acute bony abnormality. Electronically Signed   By: Rolm Baptise M.D.   On: 10/04/2016 10:25   Results for orders placed or performed in visit on 10/04/16 (from the past 24 hour(s))  POCT urinalysis dipstick     Status: None   Collection Time: 10/04/16 10:45 AM  Result Value Ref Range   Color, UA yellow yellow   Clarity, UA clear clear   Glucose, UA negative negative mg/dL   Bilirubin, UA negative negative   Ketones, POC UA negative negative mg/dL   Spec Grav, UA 1.015 1.010 - 1.025   Blood,  UA negative negative   pH, UA 5.5 5.0 - 8.0   Protein Ur, POC negative negative mg/dL   Urobilinogen, UA 0.2 0.2 or 1.0 E.U./dL   Nitrite, UA Negative Negative   Leukocytes, UA Negative Negative    Assessment and Plan :  1. Left foot pain Plain films reassuring. Instructed to ice affected area and use NSAIDs as prescribed. Return to clinic if symptoms worsen, do not improve, or as needed - DG Foot Complete Left; Future  2. Muscle spasm The "knot" pt was concerned about in her back appears to be a muscle spasm as there is no apparent bulge but the muscles do feel tight upon palpation of right sided musculature. Labs are pending. Will treat with flexeril. Instructed to return in 2 weeks for follow up, will  do a CPE at this time. Return sooner if symptoms worsen.  - CMP14+EGFR - POCT urinalysis dipstick - POCT Microscopic Urinalysis ([facility]) - cyclobenzaprine (FLEXERIL) 5 MG tablet; Take 1 tablet (5 mg total) by mouth 3 (three) times daily as needed for muscle spasms.  Dispense: 60 tablet; Refill: 0  3. Hyperplasia, fatty tissue Pt reassured that the "lumps in abdomen" were consistent with fatty tissue deposits. Pt's abdomen was also examined by Dr. Mitchel Honour, who also reassured pt.   4. Sleep disturbance Instructed that in the future she needs to see the same provider for medication refill of this controlled substance. She agrees to do so.  - ALPRAZolam (XANAX) 0.25 MG tablet; Take 0.5-1 tablets (0.125-0.25 mg total) by mouth at bedtime as needed for anxiety.  Dispense: 30 tablet; Refill: 0   Tenna Delaine PA-C  [facility] 10/04/2016 10:53 AM

## 2016-10-04 NOTE — Patient Instructions (Addendum)
For muscle spasm of right sided back, please use heat to affected area 4-5 x a day for 20 minutes at a time. I have also given you stretches to do below. Use flexeril to help relax the muscles. Remember flexeril can make you drowsy, so do not take it during the day if it causes you to feel this way. You may take 10mg  at night if needed. I will contact you in the next 4-5 days with your lab results from this visit regarding your kidneys.  For the foot pain, continue to ice the affected area and use naproxen as needed.   I have given you info below for plantar fasciitis sports rehab in case you have a flare.  Please follow up in 2 weeks with me at 104 to do the complete physical exam and follow up on your muscle spasm.   Thank you for letting me participate in your health and well being.    IF you received an x-ray today, you will receive an invoice from [facility]. Please contact [facility] at [phone] with questions or concerns regarding your invoice.   IF you received labwork today, you will receive an invoice from [facility]. Please contact [facility] at [phone] with questions or concerns regarding your invoice.   Our billing staff will not be able to assist you with questions regarding bills from these companies.  You will be contacted with the lab results as soon as they are available. The fastest way to get your results is to activate your My Chart account. Instructions are located on the last page of this paperwork. If you have not heard from [place] regarding the results in 2 weeks, please contact this office.     Mid-Back Strain Rehab Ask your health care provider which exercises are safe for you. Do exercises exactly as told by your health care provider and adjust them as directed. It is normal to feel mild stretching, pulling, tightness, or discomfort as you do these exercises, but you should stop right away if you feel sudden pain or your pain gets worse. Do not  begin these exercises until told by your health care provider. Stretching and range of motion exercises This exercise warms up your muscles and joints and improves the movement and flexibility of your back and shoulders. This exercise also help to relieve pain. Exercise A: Chest and spine stretch   1. Lie down on your back on a firm surface. 2. Roll a towel or a small blanket so it is about 4 inches (10 cm) in diameter. 3. Put the towel lengthwise under the middle of your back so it is under your spine, but not under your shoulder blades. 4. To increase the stretch, you may put your hands behind your head and let your elbows fall to your sides. 5. Hold for __________ seconds. Repeat exercise __________ times. Complete this exercise __________ times a day. Strengthening exercises These exercises build strength and endurance in your back and your shoulder blade muscles. Endurance is the ability to use your muscles for a long time, even after they get tired. Exercise B: Alternating arm and leg raises   1. Get on your hands and knees on a firm surface. If you are on a hard floor, you may want to use padding to cushion your knees, such as an exercise mat. 2. Line up your arms and legs. Your hands should be below your shoulders, and your knees should be below your hips. 3. Lift your left leg  behind you. At the same time, raise your right arm and straighten it in front of you.  Do not lift your leg higher than your hip.  Do not lift your arm higher than your shoulder.  Keep your abdominal and back muscles tight.  Keep your hips facing the ground.  Do not arch your back.  Keep your balance carefully, and do not hold your breath. 4. Hold for __________ seconds. 5. Slowly return to the starting position and repeat with your right leg and your left arm. Repeat __________ times. Complete this exercise __________ times a day. Exercise C: Straight arm rows (  shoulder extension) 1. Stand with  your feet shoulder width apart. 2. Secure an exercise band to a stable object in front of you so the band is at or above shoulder height. 3. Hold one end of the exercise band in each hand. 4. Straighten your elbows and lift your hands up to shoulder height. 5. Step back, away from the secured end of the exercise band, until the band stretches. 6. Squeeze your shoulder blades together and pull your hands down to the sides of your thighs. Stop when your hands are straight down by your sides. Do not let your hands go behind your body. 7. Hold for __________ seconds. 8. Slowly return to the starting position. Repeat __________ times. Complete this exercise __________ times a day. Exercise D: Shoulder external rotation, prone 1. Lie on your abdomen on a firm bed so your left / right forearm hangs over the edge of the bed and your upper arm is on the bed, straight out from your body.  Your elbow should be bent.  Your palm should be facing your feet. 2. If instructed, hold a __________ weight in your hand. 3. Squeeze your shoulder blade toward the middle of your back. Do not let your shoulder lift toward your ear. 4. Keep your elbow bent in an "L" shape (90 degrees) while you slowly move your forearm up toward the ceiling. Move your forearm up to the height of the bed, toward your head.  Your upper arm should not move.  At the top of the movement, your palm should face the floor. 5. Hold for __________ seconds. 6. Slowly return to the starting position and relax your muscles. Repeat __________ times. Complete this exercise __________ times a day. Exercise E: Scapular retraction and external rotation, rowing  1. Sit in a stable chair without armrests, or stand. 2. Secure an exercise band to a stable object in front of you so it is at shoulder height. 3. Hold one end of the exercise band in each hand. 4. Bring your arms out straight in front of you. 5. Step back, away from the secured end of  the exercise band, until the band stretches. 6. Pull the band backward. As you do this, bend your elbows and squeeze your shoulder blades together, but avoid letting the rest of your body move. Do not let your shoulders lift up toward your ears. 7. Stop when your elbows are at your sides or slightly behind your body. 8. Hold for __________ seconds. 9. Slowly straighten your arms to return to the starting position. Repeat __________ times. Complete this exercise __________ times a day. Posture and body mechanics  Body mechanics refers to the movements and positions of your body while you do your daily activities. Posture is part of body mechanics. Good posture and healthy body mechanics can help to relieve stress in your body's tissues and  joints. Good posture means that your spine is in its natural S-curve position (your spine is neutral), your shoulders are pulled back slightly, and your head is not tipped forward. The following are general guidelines for applying improved posture and body mechanics to your everyday activities. Standing   When standing, keep your spine neutral and your feet about hip-width apart. Keep a slight bend in your knees. Your ears, shoulders, and hips should line up.  When you do a task in which you lean forward while standing in one place for a long time, place one foot up on a stable object that is 2-4 inches (5-10 cm) high, such as a footstool. This helps keep your spine neutral. Sitting   When sitting, keep your spine neutral and keep your feet flat on the floor. Use a footrest, if necessary, and keep your thighs parallel to the floor. Avoid rounding your shoulders, and avoid tilting your head forward.  When working at a desk or a computer, keep your desk at a height where your hands are slightly lower than your elbows. Slide your chair under your desk so you are close enough to maintain good posture.  When working at a computer, place your monitor at a height  where you are looking straight ahead and you do not have to tilt your head forward or downward to look at the screen. Resting  When lying down and resting, avoid positions that are most painful for you.  If you have pain with activities such as sitting, bending, stooping, or squatting (flexion-based activities), lie in a position in which your body does not bend very much. For example, avoid curling up on your side with your arms and knees near your chest (fetal position).  If you have pain with activities such as standing for a long time or reaching with your arms (extension-based activities), lie with your spine in a neutral position and bend your knees slightly. Try the following positions:  Lying on your side with a pillow between your knees.  Lying on your back with a pillow under your knees. Lifting   When lifting objects, keep your feet at least shoulder-width apart and tighten your abdominal muscles.  Bend your knees and hips and keep your spine neutral. It is important to lift using the strength of your legs, not your back. Do not lock your knees straight out.  Always ask for help to lift heavy or awkward objects. This information is not intended to replace advice given to you by your health care provider. Make sure you discuss any questions you have with your health care provider. Document Released: 05/28/2005 Document Revised: 02/02/2016 Document Reviewed: 03/09/2015 Elsevier Interactive Patient Education  2017 [place].  Plantar Fasciitis Rehab Ask your health care provider which exercises are safe for you. Do exercises exactly as told by your health care provider and adjust them as directed. It is normal to feel mild stretching, pulling, tightness, or discomfort as you do these exercises, but you should stop right away if you feel sudden pain or your pain gets worse. Do not begin these exercises until told by your health care provider. Stretching and range of motion  exercises These exercises warm up your muscles and joints and improve the movement and flexibility of your foot. These exercises also help to relieve pain. Exercise A: Plantar fascia stretch   6. Sit with your left / right leg crossed over your opposite knee. 7. Hold your heel with one hand with that thumb  near your arch. With your other hand, hold your toes and gently pull them back toward the top of your foot. You should feel a stretch on the bottom of your toes or your foot or both. 8. Hold this stretch for__________ seconds. 9. Slowly release your toes and return to the starting position. Repeat __________ times. Complete this exercise __________ times a day. Exercise B: Gastroc, standing   1. Stand with your hands against a wall. 2. Extend your left / right leg behind you, and bend your front knee slightly. 3. Keeping your heels on the floor and keeping your back knee straight, shift your weight toward the wall without arching your back. You should feel a gentle stretch in your left / right calf. 4. Hold this position for __________ seconds. Repeat __________ times. Complete this exercise __________ times a day. Exercise C: Soleus, standing  9. Stand with your hands against a wall. 10. Extend your left / right leg behind you, and bend your front knee slightly. 11. Keeping your heels on the floor, bend your back knee and slightly shift your weight over the back leg. You should feel a gentle stretch deep in your calf. 12. Hold this position for __________ seconds. Repeat __________ times. Complete this exercise __________ times a day. Exercise D: Gastrocsoleus, standing  1. Stand with the ball of your left / right foot on a step. The ball of your foot is on the walking surface, right under your toes. 2. Keep your other foot firmly on the same step. 3. Hold onto the wall or a railing for balance. 4. Slowly lift your other foot, allowing your body weight to press your heel down over the  edge of the step. You should feel a stretch in your left / right calf. 5. Hold this position for __________ seconds. 6. Return both feet to the step. 7. Repeat this exercise with a slight bend in your left / right knee. Repeat __________ times with your left / right knee straight and __________ times with your left / right knee bent. Complete this exercise __________ times a day. Balance exercise This exercise builds your balance and strength control of your arch to help take pressure off your plantar fascia. Exercise E: Single leg stand  10. Without shoes, stand near a railing or in a doorway. You may hold onto the railing or door frame as needed. 11. Stand on your left / right foot. Keep your big toe down on the floor and try to keep your arch lifted. Do not let your foot roll inward. 12. Hold this position for __________ seconds. 13. If this exercise is too easy, you can try it with your eyes closed or while standing on a pillow. Repeat __________ times. Complete this exercise __________ times a day. This information is not intended to replace advice given to you by your health care provider. Make sure you discuss any questions you have with your health care provider. Document Released: 05/28/2005 Document Revised: 01/31/2016 Document Reviewed: 04/11/2015 Elsevier Interactive Patient Education  2017 [employer].

## 2016-10-05 ENCOUNTER — Encounter: Payer: Self-pay | Admitting: Physician Assistant

## 2016-10-05 LAB — CMP14+EGFR
ALT: 36 IU/L — ABNORMAL HIGH (ref 0–32)
AST: 27 IU/L (ref 0–40)
Albumin/Globulin Ratio: 1.4 (ref 1.2–2.2)
Albumin: 4.1 g/dL (ref 3.5–5.5)
Alkaline Phosphatase: 97 IU/L (ref 39–117)
BUN/Creatinine Ratio: 12 (ref 9–23)
BUN: 10 mg/dL (ref 6–24)
Bilirubin Total: 0.3 mg/dL (ref 0.0–1.2)
CALCIUM: 9.3 mg/dL (ref 8.7–10.2)
CO2: 26 mmol/L (ref 18–29)
CREATININE: 0.82 mg/dL (ref 0.57–1.00)
Chloride: 102 mmol/L (ref 96–106)
GFR calc Af Amer: 95 mL/min/{1.73_m2} (ref >59)
GFR, EST NON AFRICAN AMERICAN: 83 mL/min/{1.73_m2} (ref >59)
GLOBULIN, TOTAL: 2.9 g/dL (ref 1.5–4.5)
GLUCOSE: 96 mg/dL (ref 65–99)
Potassium: 4.3 mmol/L (ref 3.5–5.2)
Sodium: 144 mmol/L (ref 134–144)
Total Protein: 7 g/dL (ref 6.0–8.5)

## 2016-10-24 ENCOUNTER — Ambulatory Visit (INDEPENDENT_AMBULATORY_CARE_PROVIDER_SITE_OTHER): Payer: 59 | Admitting: Physician Assistant

## 2016-10-24 ENCOUNTER — Encounter: Payer: Self-pay | Admitting: Physician Assistant

## 2016-10-24 VITALS — BP 128/78 | HR 81 | Temp 98.3°F | Resp 16 | Ht 67.25 in | Wt 212.8 lb

## 2016-10-24 DIAGNOSIS — Z1329 Encounter for screening for other suspected endocrine disorder: Secondary | ICD-10-CM

## 2016-10-24 DIAGNOSIS — Z0000 Encounter for general adult medical examination without abnormal findings: Secondary | ICD-10-CM | POA: Diagnosis not present

## 2016-10-24 DIAGNOSIS — Z13220 Encounter for screening for lipoid disorders: Secondary | ICD-10-CM | POA: Diagnosis not present

## 2016-10-24 DIAGNOSIS — I10 Essential (primary) hypertension: Secondary | ICD-10-CM | POA: Diagnosis not present

## 2016-10-24 DIAGNOSIS — Z130 Encounter for screening for diseases of the blood and blood-forming organs and certain disorders involving the immune mechanism: Secondary | ICD-10-CM

## 2016-10-24 DIAGNOSIS — E785 Hyperlipidemia, unspecified: Secondary | ICD-10-CM

## 2016-10-24 DIAGNOSIS — Z79899 Other long term (current) drug therapy: Secondary | ICD-10-CM | POA: Diagnosis not present

## 2016-10-24 DIAGNOSIS — R609 Edema, unspecified: Secondary | ICD-10-CM | POA: Diagnosis not present

## 2016-10-24 MED ORDER — HYDROCHLOROTHIAZIDE 25 MG PO TABS: 25.0000 mg | ORAL_TABLET | Freq: Every day | ORAL | 3 refills | Status: DC

## 2016-10-24 MED ORDER — PRAVASTATIN SODIUM 40 MG PO TABS: 40.0000 mg | ORAL_TABLET | Freq: Every day | ORAL | 3 refills | Status: DC

## 2016-10-24 NOTE — Patient Instructions (Addendum)
For diet, try eating smoothies in the morning. I like eating frozen fruit, handful of spinach, 8 oz of vanilla soymilk, yogurt, and cinnamon in my smoothies. If you do not like these, make sure you start taking a daily multivitamin.   For exercise, try increasing your aerobic activity to at least 150 minutes a week. Strength training is recommended 2 days during the week.  For HTN, try cutting your HCTZ pill in half. After two weeks of this dose, check your bp a few times to see if it controlled <140/90. If it is and it is decreasing the number of times you have to pee and still controlling your swelling, then continue at this dose. Return in about 6 months for HTN follow up.   For lab work, come on Saturday to get your basic labs. I will mychart you once these are resulted.  For plantar fasciitis, buy a splint and start wearing this nightly or during the day when you are off your feet. Take time to stretch as this will help. You can also buy heel inserts, which help with pain.   Thank you for letting me participate in your health and well being.   Health Maintenance for Postmenopausal Women Menopause is a normal process in which your reproductive ability comes to an end. This process happens gradually over a span of months to years, usually between the ages of 58 and 45. Menopause is complete when you have missed 12 consecutive menstrual periods. It is important to talk with your health care provider about some of the most common conditions that affect postmenopausal women, such as heart disease, cancer, and bone loss (osteoporosis). Adopting a healthy lifestyle and getting preventive care can help to promote your health and wellness. Those actions can also lower your chances of developing some of these common conditions. What should I know about menopause? During menopause, you may experience a number of symptoms, such as:  Moderate-to-severe hot flashes.  Night sweats.  Decrease in sex  drive.  Mood swings.  Headaches.  Tiredness.  Irritability.  Memory problems.  Insomnia. Choosing to treat or not to treat menopausal changes is an individual decision that you make with your health care provider. What should I know about hormone replacement therapy and supplements? Hormone therapy products are effective for treating symptoms that are associated with menopause, such as hot flashes and night sweats. Hormone replacement carries certain risks, especially as you become older. If you are thinking about using estrogen or estrogen with progestin treatments, discuss the benefits and risks with your health care provider. What should I know about heart disease and stroke? Heart disease, heart attack, and stroke become more likely as you age. This may be due, in part, to the hormonal changes that your body experiences during menopause. These can affect how your body processes dietary fats, triglycerides, and cholesterol. Heart attack and stroke are both medical emergencies. There are many things that you can do to help prevent heart disease and stroke:  Have your blood pressure checked at least every 1-2 years. High blood pressure causes heart disease and increases the risk of stroke.  If you are 35-51 years old, ask your health care provider if you should take aspirin to prevent a heart attack or a stroke.  Do not use any tobacco products, including cigarettes, chewing tobacco, or electronic cigarettes. If you need help quitting, ask your health care provider.  It is important to eat a healthy diet and maintain a healthy weight.  Be sure to include plenty of vegetables, fruits, low-fat dairy products, and lean protein.  Avoid eating foods that are high in solid fats, added sugars, or salt (sodium).  Get regular exercise. This is one of the most important things that you can do for your health.  Try to exercise for at least 150 minutes each week. The type of exercise that you  do should increase your heart rate and make you sweat. This is known as moderate-intensity exercise.  Try to do strengthening exercises at least twice each week. Do these in addition to the moderate-intensity exercise.  Know your numbers.Ask your health care provider to check your cholesterol and your blood glucose. Continue to have your blood tested as directed by your health care provider. What should I know about cancer screening? There are several types of cancer. Take the following steps to reduce your risk and to catch any cancer development as early as possible. Breast Cancer  Practice breast self-awareness.  This means understanding how your breasts normally appear and feel.  It also means doing regular breast self-exams. Let your health care provider know about any changes, no matter how small.  If you are 68 or older, have a clinician do a breast exam (clinical breast exam or CBE) every year. Depending on your age, family history, and medical history, it may be recommended that you also have a yearly breast X-ray (mammogram).  If you have a family history of breast cancer, talk with your health care provider about genetic screening.  If you are at high risk for breast cancer, talk with your health care provider about having an MRI and a mammogram every year.  Breast cancer (BRCA) gene test is recommended for women who have family members with BRCA-related cancers. Results of the assessment will determine the need for genetic counseling and BRCA1 and for BRCA2 testing. BRCA-related cancers include these types:  Breast. This occurs in males or females.  Ovarian.  Tubal. This may also be called fallopian tube cancer.  Cancer of the abdominal or pelvic lining (peritoneal cancer).  Prostate.  Pancreatic. Cervical, Uterine, and Ovarian Cancer  Your health care provider may recommend that you be screened regularly for cancer of the pelvic organs. These include your ovaries,  uterus, and vagina. This screening involves a pelvic exam, which includes checking for microscopic changes to the surface of your cervix (Pap test).  For women ages 21-65, health care providers may recommend a pelvic exam and a Pap test every three years. For women ages 51-65, they may recommend the Pap test and pelvic exam, combined with testing for human papilloma virus (HPV), every five years. Some types of HPV increase your risk of cervical cancer. Testing for HPV may also be done on women of any age who have unclear Pap test results.  Other health care providers may not recommend any screening for nonpregnant women who are considered low risk for pelvic cancer and have no symptoms. Ask your health care provider if a screening pelvic exam is right for you.  If you have had past treatment for cervical cancer or a condition that could lead to cancer, you need Pap tests and screening for cancer for at least 20 years after your treatment. If Pap tests have been discontinued for you, your risk factors (such as having a new sexual partner) need to be reassessed to determine if you should start having screenings again. Some women have medical problems that increase the chance of getting cervical cancer. In  these cases, your health care provider may recommend that you have screening and Pap tests more often.  If you have a family history of uterine cancer or ovarian cancer, talk with your health care provider about genetic screening.  If you have vaginal bleeding after reaching menopause, tell your health care provider.  There are currently no reliable tests available to screen for ovarian cancer. Lung Cancer  Lung cancer screening is recommended for adults 26-32 years old who are at high risk for lung cancer because of a history of smoking. A yearly low-dose CT scan of the lungs is recommended if you:  Currently smoke.  Have a history of at least 30 pack-years of smoking and you currently smoke or  have quit within the past 15 years. A pack-year is smoking an average of one pack of cigarettes per day for one year. Yearly screening should:  Continue until it has been 15 years since you quit.  Stop if you develop a health problem that would prevent you from having lung cancer treatment. Colorectal Cancer  This type of cancer can be detected and can often be prevented.  Routine colorectal cancer screening usually begins at age 49 and continues through age 80.  If you have risk factors for colon cancer, your health care provider may recommend that you be screened at an earlier age.  If you have a family history of colorectal cancer, talk with your health care provider about genetic screening.  Your health care provider may also recommend using home test kits to check for hidden blood in your stool.  A small camera at the end of a tube can be used to examine your colon directly (sigmoidoscopy or colonoscopy). This is done to check for the earliest forms of colorectal cancer.  Direct examination of the colon should be repeated every 5-10 years until age 83. However, if early forms of precancerous polyps or small growths are found or if you have a family history or genetic risk for colorectal cancer, you may need to be screened more often. Skin Cancer  Check your skin from head to toe regularly.  Monitor any moles. Be sure to tell your health care provider:  About any new moles or changes in moles, especially if there is a change in a mole's shape or color.  If you have a mole that is larger than the size of a pencil eraser.  If any of your family members has a history of skin cancer, especially at a young age, talk with your health care provider about genetic screening.  Always use sunscreen. Apply sunscreen liberally and repeatedly throughout the day.  Whenever you are outside, protect yourself by wearing long sleeves, pants, a wide-brimmed hat, and sunglasses. What should I know  about osteoporosis? Osteoporosis is a condition in which bone destruction happens more quickly than new bone creation. After menopause, you may be at an increased risk for osteoporosis. To help prevent osteoporosis or the bone fractures that can happen because of osteoporosis, the following is recommended:  If you are 48-14 years old, get at least 1,000 mg of calcium and at least 600 mg of vitamin D per day.  If you are older than age 51 but younger than age 29, get at least 1,200 mg of calcium and at least 600 mg of vitamin D per day.  If you are older than age 38, get at least 1,200 mg of calcium and at least 800 mg of vitamin D per day. Smoking and  excessive alcohol intake increase the risk of osteoporosis. Eat foods that are rich in calcium and vitamin D, and do weight-bearing exercises several times each week as directed by your health care provider. What should I know about how menopause affects my mental health? Depression may occur at any age, but it is more common as you become older. Common symptoms of depression include:  Low or sad mood.  Changes in sleep patterns.  Changes in appetite or eating patterns.  Feeling an overall lack of motivation or enjoyment of activities that you previously enjoyed.  Frequent crying spells. Talk with your health care provider if you think that you are experiencing depression. What should I know about immunizations? It is important that you get and maintain your immunizations. These include:  Tetanus, diphtheria, and pertussis (Tdap) booster vaccine.  Influenza every year before the flu season begins.  Pneumonia vaccine.  Shingles vaccine. Your health care provider may also recommend other immunizations. This information is not intended to replace advice given to you by your health care provider. Make sure you discuss any questions you have with your health care provider. Document Released: 07/20/2005 Document Revised: 12/16/2015 Document  Reviewed: 03/01/2015 Elsevier Interactive Patient Education  2017 [employer].  IF you received an x-ray today, you will receive an invoice from [facility]. Please contact [facility] at [phone] with questions or concerns regarding your invoice.   IF you received labwork today, you will receive an invoice from [facility]. Please contact [facility] at [phone] with questions or concerns regarding your invoice.   Our billing staff will not be able to assist you with questions regarding bills from these companies.  You will be contacted with the lab results as soon as they are available. The fastest way to get your results is to activate your My Chart account. Instructions are located on the last page of this paperwork. If you have not heard from [place] regarding the results in 2 weeks, please contact this office.

## 2016-10-24 NOTE — Progress Notes (Signed)
Angelica Sharp  MRN: [id number] DOB: [birth year]  Subjective:  Pt is a 51 y.o. female who presents for annual physical exam. Pt is not fasting today.   Social: Works for [employer].She works from home. Divorced. Three children and seven grandchildren. They all live within 20 minutes. Pt is not sexually active.   Diet: She does not eat a variety of food. Eats chicken, hamburgers, fries, cereal. Will only eat apples will eat corn, green beans, and pinto beans. Drinks diet mountain dew and water. Does not consume much dairy products. She does not take a multivitamin but does take vitamin d.   Exercise: Walks about 1-2 miles a day. Just recently started doing planks.   Sleep: Gets about 6 hours a night.   Bowel Movements: Has one every other day  Last annual exam: 2016 Last dental exam: 2016, brushes twice a day Last vision exam: 2017, wears contact Last pap smear: s/p hysterectomy  Last mammogram: 10/10/2016, normal Last colonoscopy: 2013, has diverticulosis Vaccinations      Tetanus: 2012  Chronic conditions:  1) T2DM: Followed by Dr. Posey Pronto, endocrinology. Managed on metformin XR 750mg  BID and trulicity 0.75mg  weekly. Last appt was 3 months ago. Follows up again in 12/2016.   2) HTN: Currently managed with HCTZ 25mg . Patient is not checking blood pressure at home. Reports she was placed on HCTZ in the past because she has chronic lower leg swelling and it helps with the edema.However, it does make her have to urinate frequently. Denies lightheadedness, dizziness, chronic headache, double vision, chest pain, shortness of breath, heart racing, palpitations, nausea, vomiting, abdominal pain, hematuria, lower leg swelling. Denies smoking.   3) HLD: Currently managed with pravastatin 40mg .   Patient Active Problem List   Diagnosis Date Noted  . Chronic joint pain 03/29/2015  . DM II (diabetes mellitus, type II), controlled ([place]) 10/01/2014  . Obesity 03/12/2014  .  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 Hyperlipidemia 09/10/2013  . Inconclusive mammogram 08/31/2013  . Family history of cancer 08/30/2012  . GERD (gastroesophageal reflux disease) 07/03/2011    Current Outpatient Prescriptions on File Prior to Visit  Medication Sig Dispense Refill  . ALPRAZolam (XANAX) 0.25 MG tablet Take 0.5-1 tablets (0.125-0.25 mg total) by mouth at bedtime as needed for anxiety. 30 tablet 0  . aspirin EC 81 MG tablet Take 81 mg by mouth daily.    . Cholecalciferol (VITAMIN D) 2000 units tablet Take 2,000 Units by mouth daily.    . cyclobenzaprine (FLEXERIL) 5 MG tablet Take 1 tablet (5 mg total) by mouth 3 (three) times daily as needed for muscle spasms. 60 tablet 0  . Dulaglutide (TRULICITY) 8.11 BJ/4.7WG SOPN Inject 0.75 mg into the skin every Sunday.    . esomeprazole (NEXIUM) 40 MG capsule Take 1 capsule (40 mg total) by mouth daily at 12 noon. 90 capsule 3  . hydrochlorothiazide (HYDRODIURIL) 25 MG tablet TAKE 1 TABLET (25 MG TOTAL) BY MOUTH DAILY. 90 tablet 0  . metFORMIN (GLUCOPHAGE-XR) 750 MG 24 hr tablet Take 750 mg by mouth 2 (two) times daily.    . naproxen sodium (ANAPROX) 220 MG tablet Take 220-440 mg by mouth daily.    . polyethylene glycol (MIRALAX / GLYCOLAX) packet Take 17 g by mouth daily as needed for mild constipation.     . pravastatin (PRAVACHOL) 40 MG tablet Take 40 mg by mouth daily.    Marland Kitchen atorvastatin (LIPITOR) 20 MG tablet Take 1 tablet (20 mg total) by  mouth daily. (Patient not taking: Reported on 02/15/2016) 90 tablet 3  . esomeprazole (NEXIUM 24HR CLEAR MINIS) 20 MG capsule Take 40 mg by mouth daily.    Marland Kitchen glucose blood test strip Use as instructed 100 each 3  . metFORMIN (GLUCOPHAGE) 500 MG tablet Take one tablet twice a day with meals. (Patient not taking: Reported on 02/15/2016) 60 tablet 5  . [DISCONTINUED] calcium carbonate 200 MG capsule TWICE WEEKLY    . [DISCONTINUED] ranitidine (ZANTAC) 150 MG capsule Take 150 mg by mouth every  evening.     No current facility-administered medications on file prior to visit.     Allergies  Allergen Reactions  . Morphine And Related     Headache and hallucinations  . Septra [Sulfamethoxazole-Trimethoprim]     Increased heartrate    Social History   Social History  . Marital status: Divorced    Spouse name: N/A  . Number of children: N/A  . Years of education: N/A   Occupational History  . [employer]   Social History Main Topics  . Smoking status: Never Smoker  . Smokeless tobacco: Never Used  . Alcohol use No  . Drug use: No  . Sexual activity: No   Other Topics Concern  . None   Social History Narrative  . None    Past Surgical History:  Procedure Laterality Date  . ABDOMINAL HYSTERECTOMY    . BREAST LUMPECTOMY     3 lumps -- two from right -- one from left  . CESAREAN SECTION    . LAPAROSCOPIC ASSISTED VAGINAL HYSTERECTOMY N/A 09/18/2012   Procedure: LAPAROSCOPIC ASSISTED VAGINAL HYSTERECTOMY;  Surgeon: Linda Hedges, DO;  Location: [facility];  Service: Gynecology;  Laterality: N/A;  BLADDER  DISTENTION  . TUBAL LIGATION      Family History  Problem Relation Age of Onset  . Heart attack Mother   . Diabetes Mother   . Hyperlipidemia Mother   . Hypertension Mother   . Heart attack Sister   . Pulmonary embolism Father   . Clotting disorder Father   . Diabetes Sister   . Kidney disease Sister        kidney failure  . Heart disease Sister   . Cancer Sister   . Diabetes Son 27       TYPE 1    Review of Systems  Constitutional: Negative for activity change, appetite change, chills, diaphoresis, fatigue, fever and unexpected weight change.  HENT: Negative for congestion, dental problem, drooling, ear discharge, ear pain, facial swelling, hearing loss, mouth sores, nosebleeds, postnasal drip, rhinorrhea, sinus pain, sinus pressure, sneezing, sore throat, tinnitus, trouble swallowing and voice change.   Eyes: Negative for photophobia,  pain, discharge, redness, itching and visual disturbance.  Respiratory: Negative for apnea, cough, choking, chest tightness, shortness of breath, wheezing and stridor.   Cardiovascular: Negative for chest pain, palpitations and leg swelling.  Gastrointestinal: Negative for abdominal distention, abdominal pain, anal bleeding, blood in stool, constipation, diarrhea, nausea, rectal pain and vomiting.  Endocrine: Negative for cold intolerance, heat intolerance, polydipsia, polyphagia and polyuria.  Genitourinary: Negative for decreased urine volume, difficulty urinating, dyspareunia, dysuria, enuresis, flank pain, frequency, genital sores, hematuria, menstrual problem, pelvic pain, urgency, vaginal bleeding, vaginal discharge and vaginal pain.  Musculoskeletal: Positive for arthralgias. Negative for back pain, gait problem, joint swelling, myalgias, neck pain and neck stiffness.  Skin: Negative for color change, pallor, rash and wound.  Allergic/Immunologic: Negative for environmental allergies, food allergies and immunocompromised state.  Neurological: Negative for dizziness, tremors, seizures, syncope, facial asymmetry, speech difficulty, weakness, light-headedness, numbness and headaches.  Hematological: Negative for adenopathy. Does not bruise/bleed easily.  Psychiatric/Behavioral: Negative for agitation, behavioral problems, confusion, decreased concentration, dysphoric mood, hallucinations, self-injury, sleep disturbance and suicidal ideas. The patient is not nervous/anxious and is not hyperactive.     Objective:  BP 128/78 (BP Location: Right Arm, Patient Position: Sitting, Cuff Size: Normal)   Pulse 81   Temp 98.3 F (36.8 C) (Oral)   Resp 16   Ht 5' 7.25" (1.708 m)   Wt 212 lb 12.8 oz (96.5 kg)   LMP 08/10/2012   BMI 33.08 kg/m   Physical Exam  Constitutional: She is oriented to person, place, and time and well-developed, well-nourished, and in no distress.  HENT:  Head:  Normocephalic and atraumatic.  Right Ear: Hearing, tympanic membrane, external ear and ear canal normal.  Left Ear: Hearing, tympanic membrane, external ear and ear canal normal.  Nose: Nose normal.  Mouth/Throat: Uvula is midline, oropharynx is clear and moist and mucous membranes are normal. No oropharyngeal exudate.  Eyes: Conjunctivae, EOM and lids are normal. Pupils are equal, round, and reactive to light. No scleral icterus.  Neck: Trachea normal and normal range of motion. No thyroid mass and no thyromegaly present.  Cardiovascular: Normal rate, regular rhythm, normal heart sounds and intact distal pulses.   Pulmonary/Chest: Effort normal and breath sounds normal.  Abdominal: Soft. Normal appearance and bowel sounds are normal. There is no tenderness.  Musculoskeletal:       Right lower leg: She exhibits edema (1+).       Left lower leg: She exhibits edema ( 1+).  Lymphadenopathy:       Head (right side): No tonsillar, no preauricular, no posterior auricular and no occipital adenopathy present.       Head (left side): No tonsillar, no preauricular, no posterior auricular and no occipital adenopathy present.    She has no cervical adenopathy.       Right: No supraclavicular adenopathy present.       Left: No supraclavicular adenopathy present.  Neurological: She is alert and oriented to person, place, and time. She has normal sensation, normal strength and normal reflexes. Gait normal.  Skin: Skin is warm and dry.  Multiple small skin tags noted on neck line   Psychiatric: Affect normal.     Diabetic Foot Exam - Simple   Simple Foot Form Diabetic Foot exam was performed with the following findings:  Yes 10/24/2016  3:21 PM  Visual Inspection No deformities, no ulcerations, no other skin breakdown bilaterally:  Yes Sensation Testing Intact to touch and monofilament testing bilaterally:  Yes Pulse Check Posterior Tibialis and Dorsalis pulse intact bilaterally:  Yes Comments       Visual Acuity Screening   Right eye Left eye Both eyes  Without correction:     With correction: 20/25 20/70 20/20    BP Readings from Last 3 Encounters:  10/24/16 128/78  10/04/16 126/78  02/15/16 116/62    Assessment and Plan :  Discussed healthy lifestyle, diet, exercise, preventative care, vaccinations, and addressed patient's concerns. Plan for follow up in 6 months. Otherwise, plan for specific conditions below.  Pt was not fasting today. She would like to only have labs drawn once this week. States she will come in Saturday, 10/27/16 for FT lab visit.   1. Annual physical exam Await lab results. Instructed to add a daily multivitamin to current regimen.   2. Screening,  anemia, deficiency, iron - CBC with Differential/Platelet; Future  3. Screening, lipid - Lipid panel; Future  4. Screening for thyroid disorder - TSH; Future  5. Current use of proton pump inhibitor - Magnesium; Future  6. Hyperlipidemia, unspecified hyperlipidemia type - pravastatin (PRAVACHOL) 40 MG tablet; Take 1 tablet (40 mg total) by mouth daily.  Dispense: 90 tablet; Refill: 3  7. Essential hypertension Well controlled in office. Pt instructed that she can decrease to HCTZ 12.5mg  and see if this results in decreased urinary frequency, while still maintaining bp <140/90 and controlling edema. Pt encouraged to check bp outside of office if she is going to do this. Goal is <140/90. If >140/90 on 12.5mg , increase back up to 25mg .  Follow up in 6 months for reevaluation.  - hydrochlorothiazide (HYDRODIURIL) 25 MG tablet; Take 1 tablet (25 mg total) by mouth daily.  Dispense: 90 tablet; Refill: 3 8. Edema, unspecified type - hydrochlorothiazide (HYDRODIURIL) 25 MG tablet; Take 1 tablet (25 mg total) by mouth daily.  Dispense: 90 tablet; Refill: 3'     Tenna Delaine PA-C  [facility] 10/24/2016 4:00 PM

## 2016-10-27 ENCOUNTER — Other Ambulatory Visit: Payer: 59 | Admitting: Physician Assistant

## 2016-10-27 DIAGNOSIS — Z1329 Encounter for screening for other suspected endocrine disorder: Secondary | ICD-10-CM

## 2016-10-27 DIAGNOSIS — Z130 Encounter for screening for diseases of the blood and blood-forming organs and certain disorders involving the immune mechanism: Secondary | ICD-10-CM

## 2016-10-27 DIAGNOSIS — Z79899 Other long term (current) drug therapy: Secondary | ICD-10-CM

## 2016-10-27 DIAGNOSIS — Z13220 Encounter for screening for lipoid disorders: Secondary | ICD-10-CM

## 2016-10-27 NOTE — Progress Notes (Signed)
Lab only 

## 2016-10-28 LAB — CBC WITH DIFFERENTIAL/PLATELET
BASOS: 0 %
Basophils Absolute: 0 10*3/uL (ref 0.0–0.2)
EOS (ABSOLUTE): 0.1 10*3/uL (ref 0.0–0.4)
Eos: 2 %
HEMOGLOBIN: 11.7 g/dL (ref 11.1–15.9)
Hematocrit: 37.3 % (ref 34.0–46.6)
Immature Grans (Abs): 0 10*3/uL (ref 0.0–0.1)
Immature Granulocytes: 0 %
LYMPHS ABS: 1.7 10*3/uL (ref 0.7–3.1)
LYMPHS: 25 %
MCH: 23.6 pg — AB (ref 26.6–33.0)
MCHC: 31.4 g/dL — ABNORMAL LOW (ref 31.5–35.7)
MCV: 75 fL — AB (ref 79–97)
Monocytes Absolute: 0.3 10*3/uL (ref 0.1–0.9)
Monocytes: 5 %
NEUTROS ABS: 4.6 10*3/uL (ref 1.4–7.0)
Neutrophils: 68 %
Platelets: 298 10*3/uL (ref 150–379)
RBC: 4.95 x10E6/uL (ref 3.77–5.28)
RDW: 16.5 % — AB (ref 12.3–15.4)
WBC: 6.8 10*3/uL (ref 3.4–10.8)

## 2016-10-28 LAB — LIPID PANEL
CHOLESTEROL TOTAL: 142 mg/dL (ref 100–199)
Chol/HDL Ratio: 3.6 ratio (ref 0.0–4.4)
HDL: 40 mg/dL (ref >39)
LDL CALC: 72 mg/dL (ref 0–99)
TRIGLYCERIDES: 151 mg/dL — AB (ref 0–149)
VLDL Cholesterol Cal: 30 mg/dL (ref 5–40)

## 2016-10-28 LAB — MAGNESIUM: Magnesium: 1.6 mg/dL (ref 1.6–2.3)

## 2016-10-28 LAB — TSH: TSH: 1.61 u[IU]/mL (ref 0.450–4.500)

## 2017-01-04 ENCOUNTER — Encounter: Payer: Self-pay | Admitting: Physician Assistant

## 2017-01-04 ENCOUNTER — Ambulatory Visit (INDEPENDENT_AMBULATORY_CARE_PROVIDER_SITE_OTHER): Payer: 59 | Admitting: Physician Assistant

## 2017-01-04 VITALS — BP 125/81 | HR 91 | Temp 98.5°F | Resp 18 | Ht 67.25 in | Wt 208.4 lb

## 2017-01-04 DIAGNOSIS — M5387 Other specified dorsopathies, lumbosacral region: Secondary | ICD-10-CM

## 2017-01-04 DIAGNOSIS — M539 Dorsopathy, unspecified: Secondary | ICD-10-CM

## 2017-01-04 MED ORDER — DICLOFENAC SODIUM 75 MG PO TBEC: 75.0000 mg | DELAYED_RELEASE_TABLET | Freq: Two times a day (BID) | ORAL | 0 refills | Status: DC

## 2017-01-04 MED ORDER — CYCLOBENZAPRINE HCL 5 MG PO TABS: 5.0000 mg | ORAL_TABLET | Freq: Three times a day (TID) | ORAL | 0 refills | Status: DC | PRN

## 2017-01-04 NOTE — Progress Notes (Signed)
Chief Complaint  Patient presents with  . Hip Pain    x2 weeks, per states she is having right hip pain down to her knee. Pt thinks it could be sciatic nerve pain.   HPI: Angelica Sharp is a 51 yo female with a significant past medical history of dengenerative disc disease who comes in today with a chief complaint of a "pinched nerve." She first noticed a burning sensation of her right knee 2 week ago, which has now travel up her right thigh, hip, and posterior buttock. She describes it as a constant pain, but the pain is exacerbated after walking and standing for a significant amount of time. She complains the pain is the worse when she flexes at right hip and knee the laterally rotates her hip. Elevation of the leg and 3 extra strength Tylenol minimally help. She also tried Aleve, an "old oxycodone," and pain patches, none of which relieved the pain. She is concerned about this pain, as she has a vacation on August 11th at [facility] which she will be doing a lot of walking.  Angelica Sharp also admits to having neck pain described as a dull ache when she first took the oxycodone and "read that it could be a side effect" of the medication. She admits to full range of motion and feels it might be stress induced from worrying about her leg. She also admitted to a left heal spur, which was not discussed at this visit.   ROS DENIES: nausea, vomiting, diarrhea, constipation, fatigue, headache, blurred vision, numbness in legs and groin, nighttime pain that awakes patient from sleep, bladder incontinence. ADMITS: sleeping problems (which admits not related to her current problem), weakness  Patient Active Problem List   Diagnosis Date Noted  . Chronic joint pain 03/29/2015  . DM II (diabetes mellitus, type II), controlled ([place]) 10/01/2014  . Obesity 03/12/2014  . 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 Hyperlipidemia 09/10/2013  . Family history of cancer 08/30/2012  . GERD (gastroesophageal  reflux disease) 07/03/2011    Prior to Admission medications   Medication Sig Start Date End Date Taking? Authorizing Provider  ALPRAZolam (XANAX) 0.25 MG tablet Take 0.5-1 tablets (0.125-0.25 mg total) by mouth at bedtime as needed for anxiety. 10/04/16  Yes Leonie Douglas, PA-C  aspirin EC 81 MG tablet Take 81 mg by mouth daily.   Yes [provider]  Cholecalciferol (VITAMIN D) 2000 units tablet Take 2,000 Units by mouth daily.   Yes [provider]  Dulaglutide (TRULICITY) 1.85 UD/1.4HF SOPN Inject 0.75 mg into the skin every Sunday.   Yes [provider]  esomeprazole (NEXIUM 24HR CLEAR MINIS) 20 MG capsule Take 40 mg by mouth daily.   Yes [provider]  glucose blood test strip Use as instructed 05/10/15  Yes Jeffery, Chelle, PA-C  hydrochlorothiazide (HYDRODIURIL) 25 MG tablet Take 1 tablet (25 mg total) by mouth daily. 10/24/16  Yes Timmothy Euler, [place] D, PA-C  metFORMIN (GLUCOPHAGE-XR) 750 MG 24 hr tablet TAKE ONE TABLET BY MOUTH TWICE DAILY WITH FOOD 12/17/16  Yes [provider]  polyethylene glycol (MIRALAX / GLYCOLAX) packet Take 17 g by mouth daily as needed for mild constipation.    Yes [provider]  pravastatin (PRAVACHOL) 40 MG tablet Take 1 tablet (40 mg total) by mouth daily. 10/24/16  Yes Timmothy Euler, [place] D, PA-C  atorvastatin (LIPITOR) 20 MG tablet Take 1 tablet (20 mg total) by mouth daily. Patient not taking: Reported on 02/15/2016 04/01/15  Jeffery, Chelle, PA-C  cyclobenzaprine (FLEXERIL) 5 MG tablet Take 1-2 tablets (5-10 mg total) by mouth 3 (three) times daily as needed for muscle spasms. 01/04/17   Harrison Mons, PA-C  diclofenac (VOLTAREN) 75 MG EC tablet Take 1 tablet (75 mg total) by mouth 2 (two) times daily. 01/04/17   Harrison Mons, PA-C  esomeprazole (NEXIUM) 40 MG capsule Take 1 capsule (40 mg total) by mouth daily at 12 noon. Patient not taking: Reported on 01/04/2017 06/20/15   Harrison Mons, PA-C   ondansetron (ZOFRAN-ODT) 4 MG disintegrating tablet  11/27/16   [provider]   Allergies  Allergen Reactions  . Morphine And Related     Headache and hallucinations  . Septra [Sulfamethoxazole-Trimethoprim]     Increased heartrate     Physical Exam BP 125/81 (BP Location: Right Arm, Patient Position: Sitting, Cuff Size: Large)   Pulse 91   Temp 98.5 F (36.9 C) (Oral)   Resp 18   Ht 5' 7.25" (1.708 m)   Wt 208 lb 6.4 oz (94.5 kg)   LMP 08/10/2012   SpO2 97%   BMI 32.40 kg/m  General: Patient appears in no acute distress.  Heart: regular rate and rhythm. Lungs: clear to auscultation bilaterally MSK: Pain upon palpation over the right sacroiliac joint. Negative straight leg raise test for right and left leg. Pain in the right buttock when patient bent from lower back. Patellar reflex 3/5 bilaterally. Achilles reflex 3/5 bilaterally.    1. Sciatica associated with disorder of lumbosacral spine Advised patient to come back before her vacation (August 11th, 2018) if this problem continues. If problem continues, will give a round of Prednisone to decrease the inflammation. Advised patient to ice the area as needed, and was given instructions on take home exercises for sciatica.  - diclofenac (VOLTAREN) 75 MG EC tablet; Take 1 tablet (75 mg total) by mouth 2 (two) times daily.  Dispense: 60 tablet; Refill: 0 - cyclobenzaprine (FLEXERIL) 5 MG tablet; Take 1-2 tablets (5-10 mg total) by mouth 3 (three) times daily as needed for muscle spasms.  Dispense: 30 tablet; Refill: 0

## 2017-01-04 NOTE — Progress Notes (Signed)
Patient ID: Angelica Sharp, female    DOB: 01-Feb-1966, 51 y.o.   MRN: [id number]  PCP: Leonie Douglas, PA-C  Chief Complaint  Patient presents with  . Hip Pain    x2 weeks, per states she is having right hip pain down to her knee. Pt thinks it could be sciatic nerve pain.    Subjective:   Presents for evaluation of 2 weeks of pain in the RIGHT leg.  Initially pain was in the inferolateral RIGHT knee, and over the past two weeks has extended to involve the thigh and buttock on the RIGHT. No specific trauma or injury recalled, but she has started using a push mower to mow the very large yard (estmiates that she pushes it about 3 miles/week).  Increased pain with crossing the RIGHT leg over the left. Notes a burning/tingling sensation just distal to the knee, laterally, with this movement. No benefit from Aleve. Tried 2 doses of leftover oxycodone, and topical pain patch, neither of which helped. 3 Extra Strength Tylenol has provided only minimal benefit. No loss of bowel/bladder control. No saddle anesthesia. No weakness of the lower leg, falls, giving way.  Some neck pain, a dull ache, which she thought might be a side effect of the old oxycodone.   Her family is going to [facility] 8/11 and plan to do a lot of walking. She is concerned that the pain in the hip will limit her.  Review of Systems As above. No CP, SOB, HA, dizziness, nausea, vomiting, diarrhea. Chronic insomnia.    Patient Active Problem List   Diagnosis Date Noted  . Chronic joint pain 03/29/2015  . DM II (diabetes mellitus, type II), controlled ([place]) 10/01/2014  . Obesity 03/12/2014  . 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 Hyperlipidemia 09/10/2013  . Family history of cancer 08/30/2012  . GERD (gastroesophageal reflux disease) 07/03/2011     Prior to Admission medications   Medication Sig Start Date End Date Taking? Authorizing Provider  ALPRAZolam (XANAX) 0.25 MG tablet Take 0.5-1 tablets  (0.125-0.25 mg total) by mouth at bedtime as needed for anxiety. 10/04/16  Yes Leonie Douglas, PA-C  aspirin EC 81 MG tablet Take 81 mg by mouth daily.   Yes [provider]  Cholecalciferol (VITAMIN D) 2000 units tablet Take 2,000 Units by mouth daily.   Yes [provider]  Dulaglutide (TRULICITY) 1.44 YJ/8.5UD SOPN Inject 0.75 mg into the skin every Sunday.   Yes [provider]  esomeprazole (NEXIUM 24HR CLEAR MINIS) 20 MG capsule Take 40 mg by mouth daily.   Yes [provider]  glucose blood test strip Use as instructed 05/10/15  Yes Tahira Olivarez, PA-C  hydrochlorothiazide (HYDRODIURIL) 25 MG tablet Take 1 tablet (25 mg total) by mouth daily. 10/24/16  Yes Tenna Delaine D, PA-C  metFORMIN (GLUCOPHAGE-XR) 750 MG 24 hr tablet Take 750 mg by mouth 2 (two) times daily.   Yes [provider]  naproxen sodium (ANAPROX) 220 MG tablet Take 220-440 mg by mouth daily.   Yes [provider]  polyethylene glycol (MIRALAX / GLYCOLAX) packet Take 17 g by mouth daily as needed for mild constipation.    Yes [provider]  pravastatin (PRAVACHOL) 40 MG tablet Take 1 tablet (40 mg total) by mouth daily. 10/24/16  Yes Timmothy Euler, [place] D, PA-C  atorvastatin (LIPITOR) 20 MG tablet Take 1 tablet (20 mg total) by mouth daily. Patient not taking: Reported on 02/15/2016 04/01/15   Harrison Mons,  PA-C  cyclobenzaprine (FLEXERIL) 5 MG tablet Take 1 tablet (5 mg total) by mouth 3 (three) times daily as needed for muscle spasms. Patient not taking: Reported on 01/04/2017 10/04/16   Tenna Delaine D, PA-C  esomeprazole (NEXIUM) 40 MG capsule Take 1 capsule (40 mg total) by mouth daily at 12 noon. Patient not taking: Reported on 01/04/2017 06/20/15   Harrison Mons, PA-C  metFORMIN (GLUCOPHAGE) 500 MG tablet Take one tablet twice a day with meals. Patient not taking: Reported on 01/04/2017 10/01/14   Barton Fanny, MD     Allergies    Allergen Reactions  . Morphine And Related     Headache and hallucinations  . Septra [Sulfamethoxazole-Trimethoprim]     Increased heartrate       Objective:  Physical Exam  Constitutional: She is oriented to person, place, and time. She appears well-developed and well-nourished. She is active and cooperative. No distress.  BP 125/81 (BP Location: Right Arm, Patient Position: Sitting, Cuff Size: Large)   Pulse 91   Temp 98.5 F (36.9 C) (Oral)   Resp 18   Ht 5' 7.25" (1.708 m)   Wt 208 lb 6.4 oz (94.5 kg)   LMP 08/10/2012   SpO2 97%   BMI 32.40 kg/m   HENT:  Head: Normocephalic and atraumatic.  Right Ear: Hearing normal.  Left Ear: Hearing normal.  Eyes: Conjunctivae are normal. No scleral icterus.  Neck: Normal range of motion. Neck supple. No thyromegaly present.  Cardiovascular: Normal rate, regular rhythm and normal heart sounds.   Pulses:      Radial pulses are 2+ on the right side, and 2+ on the left side.  Pulmonary/Chest: Effort normal and breath sounds normal.  Musculoskeletal:       Cervical back: Normal.       Thoracic back: Normal.       Lumbar back: She exhibits tenderness (RIGHT SI joint) and pain. She exhibits normal range of motion, no bony tenderness, no swelling, no edema, no deformity, no laceration, no spasm and normal pulse.  Lymphadenopathy:       Head (right side): No tonsillar, no preauricular, no posterior auricular and no occipital adenopathy present.       Head (left side): No tonsillar, no preauricular, no posterior auricular and no occipital adenopathy present.    She has no cervical adenopathy.       Right: No supraclavicular adenopathy present.       Left: No supraclavicular adenopathy present.  Neurological: She is alert and oriented to person, place, and time. She has normal strength. No sensory deficit. Gait normal.  Reflex Scores:      Patellar reflexes are 2+ on the right side and 2+ on the left side.      Achilles reflexes are 2+ on  the right side and 2+ on the left side. Skin: Skin is warm, dry and intact. No rash noted. No cyanosis or erythema. Nails show no clubbing.  Psychiatric: She has a normal mood and affect. Her speech is normal and behavior is normal.         Assessment & Plan:   1. Sciatica associated with disorder of lumbosacral spine Trial of diclofenac, as she did not previously tolerate meloxicam, and has a sulfa allergy which eliminates celecoxib. Would consider use of oral prednisone if her symptoms are not improving before her trip, but prefer not to use it, given her diagnosis of diabetes. Ice PRN. Home exercises provided. - diclofenac (VOLTAREN) 75 MG EC tablet;  Take 1 tablet (75 mg total) by mouth 2 (two) times daily.  Dispense: 60 tablet; Refill: 0 - cyclobenzaprine (FLEXERIL) 5 MG tablet; Take 1-2 tablets (5-10 mg total) by mouth 3 (three) times daily as needed for muscle spasms.  Dispense: 30 tablet; Refill: 0    Return if symptoms worsen or fail to improve.   Fara Chute, PA-C [facility]

## 2017-01-04 NOTE — Patient Instructions (Addendum)
   IF you received an x-ray today, you will receive an invoice from [facility]. Please contact [facility] at [phone] with questions or concerns regarding your invoice.   IF you received labwork today, you will receive an invoice from [facility]. Please contact [facility] at [phone] with questions or concerns regarding your invoice.   Our billing staff will not be able to assist you with questions regarding bills from these companies.  You will be contacted with the lab results as soon as they are available. The fastest way to get your results is to activate your My Chart account. Instructions are located on the last page of this paperwork. If you have not heard from us regarding the results in 2 weeks, please contact this office.     Sciatica Rehab Ask your health care provider which exercises are safe for you. Do exercises exactly as told by your health care provider and adjust them as directed. It is normal to feel mild stretching, pulling, tightness, or discomfort as you do these exercises, but you should stop right away if you feel sudden pain or your pain gets worse.Do not begin these exercises until told by your health care provider. Stretching and range of motion exercises These exercises warm up your muscles and joints and improve the movement and flexibility of your hips and your back. These exercises also help to relieve pain, numbness, and tingling. Exercise A: Sciatic nerve glide 1. Sit in a chair with your head facing down toward your chest. Place your hands behind your back. Let your shoulders slump forward. 2. Slowly straighten one of your knees while you tilt your head back as if you are looking toward the ceiling. Only straighten your leg as far as you can without making your symptoms worse. 3. Hold for __________ seconds. 4. Slowly return to the starting position. 5. Repeat with your other leg. Repeat __________ times. Complete this exercise  __________ times a day. Exercise B: Knee to chest with hip adduction and internal rotation  1. Lie on your back on a firm surface with both legs straight. 2. Bend one of your knees and move it up toward your chest until you feel a gentle stretch in your lower back and buttock. Then, move your knee toward the shoulder that is on the opposite side from your leg. ? Hold your leg in this position by holding onto the front of your knee. 3. Hold for __________ seconds. 4. Slowly return to the starting position. 5. Repeat with your other leg. Repeat __________ times. Complete this exercise __________ times a day. Exercise C: Prone extension on elbows  1. Lie on your abdomen on a firm surface. A bed may be too soft for this exercise. 2. Prop yourself up on your elbows. 3. Use your arms to help lift your chest up until you feel a gentle stretch in your abdomen and your lower back. ? This will place some of your body weight on your elbows. If this is uncomfortable, try stacking pillows under your chest. ? Your hips should stay down, against the surface that you are lying on. Keep your hip and back muscles relaxed. 4. Hold for __________ seconds. 5. Slowly relax your upper body and return to the starting position. Repeat __________ times. Complete this exercise __________ times a day. Strengthening exercises These exercises build strength and endurance in your back. Endurance is the ability to use your muscles for a long time, even after they get tired. Exercise D: Pelvic   tilt 1. Lie on your back on a firm surface. Bend your knees and keep your feet flat. 2. Tense your abdominal muscles. Tip your pelvis up toward the ceiling and flatten your lower back into the floor. ? To help with this exercise, you may place a small towel under your lower back and try to push your back into the towel. 3. Hold for __________ seconds. 4. Let your muscles relax completely before you repeat this exercise. Repeat  __________ times. Complete this exercise __________ times a day. Exercise E: Alternating arm and leg raises  1. Get on your hands and knees on a firm surface. If you are on a hard floor, you may want to use padding to cushion your knees, such as an exercise mat. 2. Line up your arms and legs. Your hands should be below your shoulders, and your knees should be below your hips. 3. Lift your left leg behind you. At the same time, raise your right arm and straighten it in front of you. ? Do not lift your leg higher than your hip. ? Do not lift your arm higher than your shoulder. ? Keep your abdominal and back muscles tight. ? Keep your hips facing the ground. ? Do not arch your back. ? Keep your balance carefully, and do not hold your breath. 4. Hold for __________ seconds. 5. Slowly return to the starting position and repeat with your right leg and your left arm. Repeat __________ times. Complete this exercise __________ times a day. Posture and body mechanics  Body mechanics refers to the movements and positions of your body while you do your daily activities. Posture is part of body mechanics. Good posture and healthy body mechanics can help to relieve stress in your body's tissues and joints. Good posture means that your spine is in its natural S-curve position (your spine is neutral), your shoulders are pulled back slightly, and your head is not tipped forward. The following are general guidelines for applying improved posture and body mechanics to your everyday activities. Standing   When standing, keep your spine neutral and your feet about hip-width apart. Keep a slight bend in your knees. Your ears, shoulders, and hips should line up.  When you do a task in which you stand in one place for a long time, place one foot up on a stable object that is 2-4 inches (5-10 cm) high, such as a footstool. This helps keep your spine neutral. Sitting   When sitting, keep your spine neutral and keep  your feet flat on the floor. Use a footrest, if necessary, and keep your thighs parallel to the floor. Avoid rounding your shoulders, and avoid tilting your head forward.  When working at a desk or a computer, keep your desk at a height where your hands are slightly lower than your elbows. Slide your chair under your desk so you are close enough to maintain good posture.  When working at a computer, place your monitor at a height where you are looking straight ahead and you do not have to tilt your head forward or downward to look at the screen. Resting   When lying down and resting, avoid positions that are most painful for you.  If you have pain with activities such as sitting, bending, stooping, or squatting (flexion-based activities), lie in a position in which your body does not bend very much. For example, avoid curling up on your side with your arms and knees near your chest (fetal position).    If you have pain with activities such as standing for a long time or reaching with your arms (extension-based activities), lie with your spine in a neutral position and bend your knees slightly. Try the following positions: ? Lying on your side with a pillow between your knees. ? Lying on your back with a pillow under your knees. Lifting   When lifting objects, keep your feet at least shoulder-width apart and tighten your abdominal muscles.  Bend your knees and hips and keep your spine neutral. It is important to lift using the strength of your legs, not your back. Do not lock your knees straight out.  Always ask for help to lift heavy or awkward objects. This information is not intended to replace advice given to you by your health care provider. Make sure you discuss any questions you have with your health care provider. Document Released: 05/28/2005 Document Revised: 02/02/2016 Document Reviewed: 02/11/2015 Elsevier Interactive Patient Education  2018 [employer] Inc.  

## 2017-01-11 ENCOUNTER — Telehealth: Payer: Self-pay | Admitting: Physician Assistant

## 2017-01-11 NOTE — Telephone Encounter (Signed)
Please advise. Looking at Chelle's note looks like she said it was okay for oral steroid.

## 2017-01-11 NOTE — Telephone Encounter (Signed)
PATIENT STATES SHE SAW [place] ON Friday (01/04/17 FOR SCIATICA. CHELLE PRESCRIBED HER TO HAVE DICLOFENAC (VOLTAREN) 75 MG. SHE TOLD HER IF IT DIDN'T SEEM TO HELP THAT SHE WOULD BE ON VACATION BUT TO CALL [facility] BACK TO SEE IF SHE WOULD GIVE HER A STEROID. HER BACK REALLY HURTS WHEN SHE IS ON HER FEET A LOT AND THE VOLTAREN JUST EASED HER PAIN A LITTLE. BEST PHONE [phone] (CELL) PHARMACY CHOICE IS [facility] IN [place]. [place]

## 2017-01-12 MED ORDER — PREDNISONE 20 MG PO TABS: ORAL_TABLET | ORAL | 0 refills | Status: DC

## 2017-01-12 NOTE — Telephone Encounter (Signed)
Called patient. She is still having sciatic pain.  Saw PA Jeffery on 01/04/17. Given Rx for diclofenac. Told to call back if pain persists for prednisone course. Per pt her daily blood glucose runs around 110. Last A1C two weeks ago was 6.0. Educated pt that prednisone is a steroid and can cause side effects such as headache, irritability, nausea, vomiting, increased heart rate, increased blood pressure, increased blood sugar, appetite changes, and insomnia. Encouraged to take tablets in the morning with a full meal to help decrease the chances of these side effects.  Meds ordered this encounter  Medications  . predniSONE (DELTASONE) 20 MG tablet    Sig: Take 3 PO QAM x3days, 2 PO QAM x3days, 1 PO QAM x3days    Dispense:  18 tablet    Refill:  0    Order Specific Question:   Supervising Provider    Answer:   Wardell Honour [[id number]]

## 2017-01-14 ENCOUNTER — Encounter: Payer: Self-pay | Admitting: Physician Assistant

## 2017-02-07 ENCOUNTER — Other Ambulatory Visit: Payer: Self-pay | Admitting: Physician Assistant

## 2017-02-07 DIAGNOSIS — G479 Sleep disorder, unspecified: Secondary | ICD-10-CM

## 2017-02-07 NOTE — Telephone Encounter (Signed)
Please adivse

## 2017-02-08 NOTE — Telephone Encounter (Signed)
SS refill req Alprazolam Seem 10/04/2016 - given 30 with 0 RF for sleep disturbance - told to see same provider for refills. Sent to [place]

## 2017-02-08 NOTE — Telephone Encounter (Signed)
Please call pt and let her know she will need to see me for an office visit for refill of this medication. Thanks!

## 2017-02-16 ENCOUNTER — Other Ambulatory Visit: Payer: Self-pay | Admitting: Physician Assistant

## 2017-02-16 DIAGNOSIS — G479 Sleep disorder, unspecified: Secondary | ICD-10-CM

## 2017-02-16 NOTE — Telephone Encounter (Signed)
Please call pt and let her know she needs OV to discuss refills for this medication. Thanks.

## 2017-02-18 NOTE — Telephone Encounter (Signed)
Patient will need to come in for office visit before any additional refills can be called in for her.

## 2017-02-18 NOTE — Telephone Encounter (Signed)
Patient states she is starting a new job and will not have insurance for 60 days wanted to know if Xanax can be refilled until then.     She also states she tried to do 1/2 pill for hydrochlorazide , but her blood pressure increased so she went back to original dose.

## 2017-03-08 ENCOUNTER — Emergency Department (HOSPITAL_BASED_OUTPATIENT_CLINIC_OR_DEPARTMENT_OTHER): Payer: Self-pay

## 2017-03-08 ENCOUNTER — Emergency Department (HOSPITAL_BASED_OUTPATIENT_CLINIC_OR_DEPARTMENT_OTHER)
Admission: EM | Admit: 2017-03-08 | Discharge: 2017-03-08 | Disposition: A | Discharge status: Home / Self Care | Payer: Self-pay | Attending: Emergency Medicine | Admitting: Emergency Medicine

## 2017-03-08 ENCOUNTER — Encounter (HOSPITAL_BASED_OUTPATIENT_CLINIC_OR_DEPARTMENT_OTHER): Payer: Self-pay | Admitting: Emergency Medicine

## 2017-03-08 DIAGNOSIS — E119 Type 2 diabetes mellitus without complications: Secondary | ICD-10-CM | POA: Insufficient documentation

## 2017-03-08 DIAGNOSIS — Z79899 Other long term (current) drug therapy: Secondary | ICD-10-CM | POA: Insufficient documentation

## 2017-03-08 DIAGNOSIS — I10 Essential (primary) hypertension: Secondary | ICD-10-CM | POA: Insufficient documentation

## 2017-03-08 DIAGNOSIS — Z7984 Long term (current) use of oral hypoglycemic drugs: Secondary | ICD-10-CM | POA: Insufficient documentation

## 2017-03-08 DIAGNOSIS — R110 Nausea: Secondary | ICD-10-CM | POA: Insufficient documentation

## 2017-03-08 DIAGNOSIS — R197 Diarrhea, unspecified: Secondary | ICD-10-CM | POA: Insufficient documentation

## 2017-03-08 DIAGNOSIS — Z7982 Long term (current) use of aspirin: Secondary | ICD-10-CM | POA: Insufficient documentation

## 2017-03-08 DIAGNOSIS — R1030 Lower abdominal pain, unspecified: Secondary | ICD-10-CM | POA: Insufficient documentation

## 2017-03-08 LAB — URINALYSIS, ROUTINE W REFLEX MICROSCOPIC
BILIRUBIN URINE: NEGATIVE
Glucose, UA: NEGATIVE mg/dL
Hgb urine dipstick: NEGATIVE
Ketones, ur: NEGATIVE mg/dL
Leukocytes, UA: NEGATIVE
NITRITE: NEGATIVE
PH: 6.5 (ref 5.0–8.0)
Protein, ur: NEGATIVE mg/dL

## 2017-03-08 LAB — CBC WITH DIFFERENTIAL/PLATELET
Basophils Absolute: 0 10*3/uL (ref 0.0–0.1)
Basophils Relative: 1 %
EOS ABS: 0.1 10*3/uL (ref 0.0–0.7)
Eosinophils Relative: 2 %
HEMATOCRIT: 37.3 % (ref 36.0–46.0)
HEMOGLOBIN: 11.5 g/dL — AB (ref 12.0–15.0)
LYMPHS ABS: 1.5 10*3/uL (ref 0.7–4.0)
LYMPHS PCT: 21 %
MCH: 23.9 pg — AB (ref 26.0–34.0)
MCHC: 30.8 g/dL (ref 30.0–36.0)
MCV: 77.5 fL — AB (ref 78.0–100.0)
MONOS PCT: 5 %
Monocytes Absolute: 0.4 10*3/uL (ref 0.1–1.0)
NEUTROS ABS: 5.2 10*3/uL (ref 1.7–7.7)
NEUTROS PCT: 71 %
Platelets: 313 10*3/uL (ref 150–400)
RBC: 4.81 MIL/uL (ref 3.87–5.11)
RDW: 16 % — ABNORMAL HIGH (ref 11.5–15.5)
WBC: 7.2 10*3/uL (ref 4.0–10.5)

## 2017-03-08 LAB — LIPASE, BLOOD: Lipase: 28 U/L (ref 11–51)

## 2017-03-08 LAB — COMPREHENSIVE METABOLIC PANEL
ALK PHOS: 80 U/L (ref 38–126)
ALT: 36 U/L (ref 14–54)
ANION GAP: 10 (ref 5–15)
AST: 37 U/L (ref 15–41)
Albumin: 4.1 g/dL (ref 3.5–5.0)
BILIRUBIN TOTAL: 0.4 mg/dL (ref 0.3–1.2)
BUN: 18 mg/dL (ref 6–20)
CALCIUM: 9.3 mg/dL (ref 8.9–10.3)
CO2: 26 mmol/L (ref 22–32)
Chloride: 102 mmol/L (ref 101–111)
Creatinine, Ser: 1.12 mg/dL — ABNORMAL HIGH (ref 0.44–1.00)
GFR, EST NON AFRICAN AMERICAN: 55 mL/min — AB (ref >60)
Glucose, Bld: 101 mg/dL — ABNORMAL HIGH (ref 65–99)
Potassium: 3.9 mmol/L (ref 3.5–5.1)
SODIUM: 138 mmol/L (ref 135–145)
TOTAL PROTEIN: 7.4 g/dL (ref 6.5–8.1)

## 2017-03-08 MED ORDER — IOPAMIDOL (ISOVUE-300) INJECTION 61%
100.0000 mL | Freq: Once | INTRAVENOUS | Status: AC | PRN
Start: 2017-03-08 — End: 2017-03-08
  Administered 2017-03-08: 100 mL via INTRAVENOUS

## 2017-03-08 MED ORDER — DICYCLOMINE HCL 20 MG PO TABS: 20.0000 mg | ORAL_TABLET | Freq: Two times a day (BID) | ORAL | 0 refills | Status: DC

## 2017-03-08 MED ORDER — ONDANSETRON HCL 4 MG/2ML IJ SOLN
4.0000 mg | Freq: Once | INTRAMUSCULAR | Status: AC
  Administered 2017-03-08: 4 mg via INTRAVENOUS
  Filled 2017-03-08: qty 2

## 2017-03-08 MED ORDER — ONDANSETRON 4 MG PO TBDP: 4.0000 mg | ORAL_TABLET | Freq: Three times a day (TID) | ORAL | 0 refills | Status: DC | PRN

## 2017-03-08 MED ORDER — HYDROMORPHONE HCL 1 MG/ML IJ SOLN
0.5000 mg | Freq: Once | INTRAMUSCULAR | Status: AC
  Administered 2017-03-08: 0.5 mg via INTRAVENOUS
  Filled 2017-03-08: qty 1

## 2017-03-08 NOTE — ED Notes (Signed)
Pt will be given oral contrast, labs must result prior to ct imaging with iv contrast for renal function d/t hx DM

## 2017-03-08 NOTE — ED Notes (Addendum)
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 Ambulatory in NAD.

## 2017-03-08 NOTE — ED Provider Notes (Signed)
[facility] DE[facility] Provider Note   CSN: [id number] Arrival date & time: 03/08/17  0945     History   Chief Complaint Chief Complaint  Patient presents with  . Abdominal Pain    HPI Angelica Sharp is a 51 y.o. female.  The history is provided by the patient and medical records.  Abdominal Pain   Associated symptoms include diarrhea and nausea.    51 y.o. F with hx of anemia, anxiety, arthritis, DM, diverticulitis, GERD, HTN, presenting to the ED for lower abdominal pain.  States this began around 1am Wednesday morning (2 days ago).  States she was experiencing a lot of pain in the LLQ at that time which felt like a twisting sensation in her abdomen.  Pain got better after some miralax and several BM's but pain returned last night has been more constant throughout lower abdomen since.  She reports now having diarrhea and nausea but denies vomiting.  No fever, chills.  No urinary symptoms or pelvic complaints.  No flank pain.  Has had prior hysterectomy and tubal ligation.  Past Medical History:  Diagnosis Date  . Allergy   . Anemia   . Anxiety   . Arthritis   . Borderline hypertension   . Chest pain   . Degenerative lumbar disc    L5S1  . Diabetes mellitus without complication ([place])   . Diverticulitis   . Edema    lower extremity  . Family history of anesthesia complication    sister had BP complications during anes.  Marland Kitchen GERD (gastroesophageal reflux disease)   . H/O hiatal hernia   . Hypertension   . SOB (shortness of breath)     Patient Active Problem List   Diagnosis Date Noted  . Chronic joint pain 03/29/2015  . DM II (diabetes mellitus, type II), controlled ([place]) 10/01/2014  . Obesity 03/12/2014  . 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 Hyperlipidemia 09/10/2013  . Family history of cancer 08/30/2012  . GERD (gastroesophageal reflux disease) 07/03/2011    Past Surgical History:  Procedure Laterality Date  . ABDOMINAL HYSTERECTOMY    . BREAST  LUMPECTOMY     3 lumps -- two from right -- one from left  . CESAREAN SECTION    . LAPAROSCOPIC ASSISTED VAGINAL HYSTERECTOMY N/A 09/18/2012   Procedure: LAPAROSCOPIC ASSISTED VAGINAL HYSTERECTOMY;  Surgeon: Linda Hedges, DO;  Location: [facility];  Service: Gynecology;  Laterality: N/A;  BLADDER  DISTENTION  . TUBAL LIGATION      OB History    No data available       Home Medications    Prior to Admission medications   Medication Sig Start Date End Date Taking? Authorizing Provider  ALPRAZolam (XANAX) 0.25 MG tablet Take 0.5-1 tablets (0.125-0.25 mg total) by mouth at bedtime as needed for anxiety. 10/04/16   Leonie Douglas, PA-C  aspirin EC 81 MG tablet Take 81 mg by mouth daily.    [provider]  atorvastatin (LIPITOR) 20 MG tablet Take 1 tablet (20 mg total) by mouth daily. Patient not taking: Reported on 02/15/2016 04/01/15   Harrison Mons, PA-C  Cholecalciferol (VITAMIN D) 2000 units tablet Take 2,000 Units by mouth daily.    [provider]  cyclobenzaprine (FLEXERIL) 5 MG tablet Take 1-2 tablets (5-10 mg total) by mouth 3 (three) times daily as needed for muscle spasms. 01/04/17   Harrison Mons, PA-C  diclofenac (VOLTAREN) 75 MG EC tablet Take 1 tablet (75 mg total) by mouth 2 (two) times  daily. 01/04/17   Harrison Mons, PA-C  Dulaglutide (TRULICITY) 9.37 TK/2.4OX SOPN Inject 0.75 mg into the skin every Sunday.    [provider]  esomeprazole (NEXIUM 24HR CLEAR MINIS) 20 MG capsule Take 40 mg by mouth daily.    [provider]  esomeprazole (NEXIUM) 40 MG capsule Take 1 capsule (40 mg total) by mouth daily at 12 noon. Patient not taking: Reported on 01/04/2017 06/20/15   Harrison Mons, PA-C  glucose blood test strip Use as instructed 05/10/15   Harrison Mons, PA-C  hydrochlorothiazide (HYDRODIURIL) 25 MG tablet Take 1 tablet (25 mg total) by mouth daily. 10/24/16   Tenna Delaine D, PA-C  metFORMIN (GLUCOPHAGE-XR) 750 MG 24 hr tablet  TAKE ONE TABLET BY MOUTH TWICE DAILY WITH FOOD 12/17/16   [provider]  ondansetron (ZOFRAN-ODT) 4 MG disintegrating tablet  11/27/16   [provider]  polyethylene glycol (MIRALAX / GLYCOLAX) packet Take 17 g by mouth daily as needed for mild constipation.     [provider]  pravastatin (PRAVACHOL) 40 MG tablet Take 1 tablet (40 mg total) by mouth daily. 10/24/16   Tenna Delaine D, PA-C  predniSONE (DELTASONE) 20 MG tablet Take 3 PO QAM x3days, 2 PO QAM x3days, 1 PO QAM x3days 01/12/17   Leonie Douglas, PA-C    Family History Family History  Problem Relation Age of Onset  . Heart attack Mother   . Diabetes Mother   . Hyperlipidemia Mother   . Hypertension Mother   . Heart attack Sister   . Pulmonary embolism Father   . Clotting disorder Father   . Diabetes Sister   . Kidney disease Sister        kidney failure  . Heart disease Sister   . Cancer Sister   . Diabetes Son 27       TYPE 1    Social History Social History  Substance Use Topics  . Smoking status: Never Smoker  . Smokeless tobacco: Never Used  . Alcohol use No     Allergies   Morphine and related and Septra [sulfamethoxazole-trimethoprim]   Review of Systems Review of Systems  Gastrointestinal: Positive for abdominal pain, diarrhea and nausea.  All other systems reviewed and are negative.    Physical Exam Updated Vital Signs BP (!) 117/94 (BP Location: Left Arm)   Pulse 90   Temp 97.8 F (36.6 C) (Oral)   Resp 18   Ht 5\' 7"  (1.702 m)   Wt 93 kg (205 lb)   LMP 08/10/2012   SpO2 100%   BMI 32.11 kg/m   Physical Exam  Constitutional: She is oriented to person, place, and time. She appears well-developed and well-nourished.  HENT:  Head: Normocephalic and atraumatic.  Mouth/Throat: Oropharynx is clear and moist.  Eyes: Pupils are equal, round, and reactive to light. Conjunctivae and EOM are normal.  Neck: Normal range of motion.  Cardiovascular: Normal rate,  regular rhythm and normal heart sounds.   Pulmonary/Chest: Effort normal and breath sounds normal. No respiratory distress. She has no wheezes.  Abdominal: Soft. Bowel sounds are normal. There is tenderness in the right lower quadrant, suprapubic area and left lower quadrant.  Musculoskeletal: Normal range of motion.  Neurological: She is alert and oriented to person, place, and time.  Skin: Skin is warm and dry.  Psychiatric: She has a normal mood and affect.  Nursing note and vitals reviewed.    ED Treatments / Results  Labs (all labs ordered are listed,  but only abnormal results are displayed) Labs Reviewed  URINALYSIS, ROUTINE W REFLEX MICROSCOPIC - Abnormal; Notable for the following:       Result Value   Specific Gravity, Urine <1.005 (*)    All other components within normal limits  CBC WITH DIFFERENTIAL/PLATELET - Abnormal; Notable for the following:    Hemoglobin 11.5 (*)    MCV 77.5 (*)    MCH 23.9 (*)    RDW 16.0 (*)    All other components within normal limits  COMPREHENSIVE METABOLIC PANEL - Abnormal; Notable for the following:    Glucose, Bld 101 (*)    Creatinine, Ser 1.12 (*)    GFR calc non Af Amer 55 (*)    All other components within normal limits  LIPASE, BLOOD    EKG  EKG Interpretation None       Radiology Ct Abdomen Pelvis W Contrast  Result Date: 03/08/2017 CLINICAL DATA:  51 year old female with lower abdominal and pelvic pain with diarrhea and nausea for 2 days. EXAM: CT ABDOMEN AND PELVIS WITH CONTRAST TECHNIQUE: Multidetector CT imaging of the abdomen and pelvis was performed using the standard protocol following bolus administration of intravenous contrast. CONTRAST:  124mL ISOVUE-300 IOPAMIDOL (ISOVUE-300) INJECTION 61% COMPARISON:  12/30/2014 and prior CTs FINDINGS: Lower chest: No acute abnormality. Hepatobiliary: The liver and gallbladder are unremarkable except for small hepatic cyst. No biliary dilatation. Pancreas: Unremarkable Spleen:  Unremarkable Adrenals/Urinary Tract: The kidneys, adrenal glands and bladder are unremarkable except for small stable renal cysts. Stomach/Bowel: Stomach is within normal limits. Appendix appears normal. No evidence of bowel wall thickening, distention, or inflammatory changes. Colonic diverticulosis noted without evidence of diverticulitis. Vascular/Lymphatic: No significant vascular findings are present. No enlarged abdominal or pelvic lymph nodes. Reproductive: Status post hysterectomy. No adnexal masses. Other: No ascites, focal collection or pneumoperitoneum. Musculoskeletal: No acute or significant osseous findings. IMPRESSION: 1. No evidence of acute abnormality 2. Colonic diverticulosis without diverticulitis. Electronically Signed   By: Margarette Canada M.D.   On: 03/08/2017 12:50    Procedures Procedures (including critical care time)  Medications Ordered in ED Medications  HYDROmorphone (DILAUDID) injection 0.5 mg (0.5 mg Intravenous Given 03/08/17 1047)  ondansetron (ZOFRAN) injection 4 mg (4 mg Intravenous Given 03/08/17 1045)  iopamidol (ISOVUE-300) 61 % injection 100 mL (100 mLs Intravenous Contrast Given 03/08/17 1224)     Initial Impression / Assessment and Plan / ED Course  I have reviewed the triage vital signs and the nursing notes.  Pertinent labs & imaging results that were available during my care of the patient were reviewed by me and considered in my medical decision making (see chart for details).  51 year old female here with abdominal pain. Reports history of diverticulitis, is concerned for same. She is afebrile and nontoxic. Tenderness across lower abdomen, slightly worse on the left side. Screening labs overall reassuring, normal white blood cell count. UA without signs of infection.  Given her history, CT scan obtained-- no acute infectious/surgical process.  Results discussed with patient.  She is feeling better after treatment here.  Will plan to d/c home with symptomatic  care.  She does have GI physician that she can follow-up with if any ongoing issues.  Discussed plan with patient, she acknowledged understanding and agreed with plan of care.  Return precautions given for new or worsening symptoms.  Final Clinical Impressions(s) / ED Diagnoses   Final diagnoses:  Lower abdominal pain  Nausea    New Prescriptions Discharge Medication List as of 03/08/2017  1:25 PM    START taking these medications   Details  dicyclomine (BENTYL) 20 MG tablet Take 1 tablet (20 mg total) by mouth 2 (two) times daily., Starting Fri 03/08/2017, Print         Larene Pickett, PA-C 03/08/17 1339    Tegeler, Gwenyth Allegra, MD 03/08/17 [phone]

## 2017-03-08 NOTE — ED Triage Notes (Signed)
Lower abd pain radiates to lower back x 2 days with n/v/d.

## 2017-03-08 NOTE — Discharge Instructions (Signed)
Take the prescribed medication as directed for symptomatic control. Follow-up with your primary care doctor. If you have ongoing GI issues, you may want to follow-up with your GI specialist. Return to the ED for new or worsening symptoms.

## 2017-03-14 ENCOUNTER — Other Ambulatory Visit: Payer: Self-pay | Admitting: Physician Assistant

## 2017-03-14 DIAGNOSIS — M5387 Other specified dorsopathies, lumbosacral region: Secondary | ICD-10-CM

## 2017-03-19 ENCOUNTER — Encounter: Payer: Self-pay | Admitting: Physician Assistant

## 2017-03-19 MED ORDER — DICLOFENAC SODIUM 75 MG PO TBEC: 75.0000 mg | DELAYED_RELEASE_TABLET | Freq: Two times a day (BID) | ORAL | 0 refills | Status: DC

## 2017-04-27 ENCOUNTER — Other Ambulatory Visit: Payer: Self-pay | Admitting: Physician Assistant

## 2017-04-27 DIAGNOSIS — M5387 Other specified dorsopathies, lumbosacral region: Secondary | ICD-10-CM

## 2017-05-22 ENCOUNTER — Encounter: Payer: Self-pay | Admitting: Physician Assistant

## 2017-05-30 ENCOUNTER — Other Ambulatory Visit: Payer: Self-pay | Admitting: Physician Assistant

## 2017-05-30 DIAGNOSIS — M5387 Other specified dorsopathies, lumbosacral region: Secondary | ICD-10-CM

## 2017-05-31 MED ORDER — DICLOFENAC SODIUM 75 MG PO TBEC: 75.0000 mg | DELAYED_RELEASE_TABLET | Freq: Two times a day (BID) | ORAL | 0 refills | Status: DC

## 2017-06-10 ENCOUNTER — Other Ambulatory Visit: Payer: Self-pay

## 2017-06-10 ENCOUNTER — Encounter: Payer: Self-pay | Admitting: Physician Assistant

## 2017-06-10 ENCOUNTER — Ambulatory Visit: Payer: 59 | Admitting: Physician Assistant

## 2017-06-10 VITALS — BP 118/82 | HR 91 | Temp 98.4°F | Resp 18 | Ht 68.35 in | Wt 205.0 lb

## 2017-06-10 DIAGNOSIS — R509 Fever, unspecified: Secondary | ICD-10-CM

## 2017-06-10 DIAGNOSIS — J209 Acute bronchitis, unspecified: Secondary | ICD-10-CM | POA: Diagnosis not present

## 2017-06-10 DIAGNOSIS — M79672 Pain in left foot: Secondary | ICD-10-CM | POA: Diagnosis not present

## 2017-06-10 DIAGNOSIS — R059 Cough, unspecified: Secondary | ICD-10-CM

## 2017-06-10 DIAGNOSIS — R05 Cough: Secondary | ICD-10-CM | POA: Diagnosis not present

## 2017-06-10 LAB — POC INFLUENZA A&B (BINAX/QUICKVUE)
Influenza A, POC: NEGATIVE
Influenza B, POC: NEGATIVE

## 2017-06-10 MED ORDER — BENZONATATE 100 MG PO CAPS: 100.0000 mg | ORAL_CAPSULE | Freq: Three times a day (TID) | ORAL | 0 refills | Status: DC | PRN

## 2017-06-10 MED ORDER — HYDROCODONE-HOMATROPINE 5-1.5 MG/5ML PO SYRP: 5.0000 mL | ORAL_SOLUTION | Freq: Three times a day (TID) | ORAL | 0 refills | Status: DC | PRN

## 2017-06-10 NOTE — Progress Notes (Signed)
MRN: [id number] DOB: 05-11-1966  Subjective:   Angelica Sharp is a 51 y.o. female presenting for chief complaint of Cough (X 2 days) and Foot Pain (left foot- pt states sciatica pain) .  1) Reports 3 day history of Fever (100.8), dry cough (no hemoptysis), wheezing, chest tightness, myalgia, chills and fatigue. Has tried OTC cough syrup and tylenol with mild relief of fever. Denies shortness of breath,  sinus congestion, sinus pain, ear pain, sore throat and chest pain, nausea, vomiting, abdominal pain and diarrhea. Has had sick contact with people at work. No history of seasonal allergies, no history of asthma or COPD. Patient did not get flu shot this season. Denies smoking.  2) Left foot pain: She has been dealing with this for about 6 months. Was evaluated by me in office for this in 09/2016. Plain films showed early degentaive changes at first MTP joint and plantar calcaneal spur but those are not the places she has pain. The pain is on the lateral aspect of top of foot. Has tried better orthotics. Pain is worse with ambulation. Denies acute injury, swelling, redness, and decreased ROM. Take daily voltaren for sciatica.  No hx gout.  Angelica Sharp has a current medication list which includes the following prescription(s): alprazolam, aspirin ec, atorvastatin, benzonatate, vitamin d, cyclobenzaprine, diclofenac, dicyclomine, dulaglutide, esomeprazole, esomeprazole, glucose blood, hydrochlorothiazide, hydrocodone-homatropine, metformin, ondansetron, polyethylene glycol, pravastatin, and prednisone. Also is allergic to morphine and related and septra [sulfamethoxazole-trimethoprim].  Angelica Sharp  has a past medical history of Allergy, Anemia, Anxiety, Arthritis, Borderline hypertension, Chest pain, Degenerative lumbar disc, Diabetes mellitus without complication ([place]), Diverticulitis, Edema, Family history of anesthesia complication, GERD (gastroesophageal reflux disease), H/O hiatal hernia, Hypertension, and  SOB (shortness of breath). Also  has a past surgical history that includes Tubal ligation; Breast lumpectomy; Laparoscopic assisted vaginal hysterectomy (N/A, 09/18/2012); Abdominal hysterectomy; and Cesarean section.   Objective:   Vitals: BP 118/82 (BP Location: Left Arm, Patient Position: Sitting, Cuff Size: Normal)   Pulse 91   Temp 98.4 F (36.9 C) (Oral)   Resp 18   Ht 5' 8.35" (1.736 m)   Wt 205 lb (93 kg)   LMP 08/10/2012   SpO2 99%   BMI 30.86 kg/m   Physical Exam  Constitutional: She is oriented to person, place, and time. She appears well-developed and well-nourished. No distress.  HENT:  Head: Normocephalic and atraumatic.  Right Ear: Tympanic membrane, external ear and ear canal normal.  Left Ear: Tympanic membrane, external ear and ear canal normal.  Nose: Mucosal edema present. Right sinus exhibits no maxillary sinus tenderness and no frontal sinus tenderness. Left sinus exhibits no maxillary sinus tenderness and no frontal sinus tenderness.  Mouth/Throat: Uvula is midline and mucous membranes are normal. Posterior oropharyngeal erythema present. Tonsils are 1+ on the right. Tonsils are 1+ on the left. No tonsillar exudate.  Eyes: Conjunctivae are normal.  Neck: Normal range of motion.  Cardiovascular: Normal rate, regular rhythm, normal heart sounds and intact distal pulses.  Pulmonary/Chest: Effort normal and breath sounds normal. No respiratory distress. She has no decreased breath sounds. She has no wheezes. She has no rhonchi. She has no rales.  Musculoskeletal:       Left ankle: Normal. She exhibits normal range of motion. No tenderness. Achilles tendon normal.       Left foot: There is tenderness (with palpation between the third and fourth distal metatarsals). There is normal range of motion, no bony tenderness, no swelling and normal capillary refill.  Pain noted in left forefoot when transverse pressure applied to heads of metatarsals.  Lymphadenopathy:        Head (right side): No submental, no submandibular, no tonsillar, no preauricular, no posterior auricular and no occipital adenopathy present.       Head (left side): No submental, no submandibular, no tonsillar, no preauricular, no posterior auricular and no occipital adenopathy present.    She has no cervical adenopathy.       Right: No supraclavicular adenopathy present.       Left: No supraclavicular adenopathy present.  Neurological: She is alert and oriented to person, place, and time.  Skin: Skin is warm and dry.  Psychiatric: She has a normal mood and affect.  Vitals reviewed.   Results for orders placed or performed in visit on 06/10/17 (from the past 24 hour(s))  POC Influenza A&B(BINAX/QUICKVUE)     Status: None   Collection Time: 06/10/17  3:23 PM  Result Value Ref Range   Influenza A, POC Negative Negative   Influenza B, POC Negative Negative    Assessment and Plan :  1. Cough - POC Influenza A&B(BINAX/QUICKVUE) 2. Fever, unspecified fever cause 3. Acute bronchitis, unspecified organism Patient is well-appearing.  No distress.  Vitals are stable.  She is afebrile.  Lungs are CTAB.  Point-of-care testing negative for influenza.  Will treat for acute viral bronchitis at this time with symptomatic relief.  Patient advised to return to clinic if symptoms worsen, do not improve in 5-7 days, or as needed. - benzonatate (TESSALON) 100 MG capsule; Take 1-2 capsules (100-200 mg total) by mouth 3 (three) times daily as needed for cough.  Dispense: 40 capsule; Refill: 0 - HYDROcodone-homatropine (HYCODAN) 5-1.5 MG/5ML syrup; Take 5 mLs by mouth every 8 (eight) hours as needed for cough.  Dispense: 120 mL; Refill: 0  4. Left foot pain Left foot pain suspicious for Morton's Neuroma. No fracture noted on xray from 09/2016. Will refer to orthopedics for further evaluation and treatment. Recommend broad based shoe and inserts in the meantime. Follow up as needed.  - Ambulatory referral to  [place], PA-C  [facility] 06/10/2017 5:09 PM

## 2017-06-10 NOTE — Patient Instructions (Addendum)
Your flu test was negative, which is great. We will treat this as a respiratory viral infection.  - I recommend you rest, drink plenty of fluids, eat light meals including soups.  - You may use OTC delsym night time cough syrup at night for your cough and sore throat, Tessalon pearls during the day. - Please let me know if you are not seeing any improvement or get worse in 5-7 days.  For foot pain, your symptoms are suggestive of a Morton's neuroma.  I have placed a referral for orthopedics for further evaluation and treatment.  In the meantime, I recommend wearing a broad-based shoe and applying ice to the affected area as needed.  Below is some more information about caring for a Morton's neuroma.  Acute Bronchitis, Adult Acute bronchitis is when air tubes (bronchi) in the lungs suddenly get swollen. The condition can make it hard to breathe. It can also cause these symptoms:  A cough.  Coughing up clear, yellow, or green mucus.  Wheezing.  Chest congestion.  Shortness of breath.  A fever.  Body aches.  Chills.  A sore throat.  Follow these instructions at home: Medicines  Take over-the-counter and prescription medicines only as told by your doctor.  If you were prescribed an antibiotic medicine, take it as told by your doctor. Do not stop taking the antibiotic even if you start to feel better. General instructions  Rest.  Drink enough fluids to keep your pee (urine) clear or pale yellow.  Avoid smoking and secondhand smoke. If you smoke and you need help quitting, ask your doctor. Quitting will help your lungs heal faster.  Use an inhaler, cool mist vaporizer, or humidifier as told by your doctor.  Keep all follow-up visits as told by your doctor. This is important. How is this prevented? To lower your risk of getting this condition again:  Wash your hands often with soap and water. If you cannot use soap and water, use hand sanitizer.  Avoid contact with people  who have cold symptoms.  Try not to touch your hands to your mouth, nose, or eyes.  Make sure to get the flu shot every year.  Contact a doctor if:  Your symptoms do not get better in 2 weeks. Get help right away if:  You cough up blood.  You have chest pain.  You have very bad shortness of breath.  You become dehydrated.  You faint (pass out) or keep feeling like you are going to pass out.  You keep throwing up (vomiting).  You have a very bad headache.  Your fever or chills gets worse. This information is not intended to replace advice given to you by your health care provider. Make sure you discuss any questions you have with your health care provider. Document Released: 11/14/2007 Document Revised: 01/04/2016 Document Reviewed: 11/16/2015 Elsevier Interactive Patient Education  2018 [employer] Neuralgia [place] Lathe neuralgia is a type of foot pain in the area closest to your toes. This area is sometimes called the ball of your foot. Morton neuralgia occurs when a branch of a nerve in your foot (digital nerve) becomes compressed. When this happens over a long period of time, the nerve can thicken (neuroma) and cause pain. This usually occurs between the third and fourth toe. Morton neuralgia can come and go but may get worse over time. What are the causes? Your digital nerve can become compressed and stretched at a point where it passes under a thick band  of tissue that connects your toes (intermetatarsal ligament). Morton neuralgia can be caused by mild repetitive damage in this area. This type of damage can result from:  Activities such as running or jumping.  Wearing shoes that are too tight.  What increases the risk? You may be at risk for Morton neuralgia if you:  Are female.  Wear high heels.  Wear shoes that are narrow or tight.  Participate in activities that stretch your toes. These include: ? Running. ? [place]. ? Long-distance walking.  What  are the signs or symptoms? The first symptom of Morton neuralgia is pain that spreads from the ball of your foot to your toes. It may feel like you are walking on a marble. Pain usually gets worse with walking and goes away at night. Other symptoms may include numbness and cramping of your toes. How is this diagnosed? Your health care provider will do a physical exam. When doing the exam, your health care provider may:  Squeeze your foot just behind your toe.  Ask you to move your toes to check for pain.  You may also have tests on your foot to confirm the diagnosis. These may include:  An X-ray.  An MRI.  How is this treated? Treatment for Morton neuralgia may be as simple as changing the kind of shoes you wear. Other treatments may include:  Wearing a supportive pad (orthosis) under the front of your foot. This lifts your toe bones and takes pressure off the nerve.  Getting injections of numbing medicine and anti-inflammatory medicine (steroid) in the nerve.  Having surgery to remove part of the thickened nerve.  Follow these instructions at home:  Take medicine only as directed by your health care provider.  Wear soft-soled shoes with a wide toe area.  Stop activities that may be causing pain.  Elevate your foot when resting.  Massage your foot.  Apply ice to the injured area: ? Put ice in a plastic bag. ? Place a towel between your skin and the bag. ? Leave the ice on for 20 minutes, 2-3 times a day.  Keep all follow-up visits as directed by your health care provider. This is important. Contact a health care provider if:  Home care instructions are not helping you get better.  Your symptoms change or get worse. This information is not intended to replace advice given to you by your health care provider. Make sure you discuss any questions you have with your health care provider. Document Released: 09/03/2000 Document Revised: 11/03/2015 Document Reviewed:  07/29/2013 Elsevier Interactive Patient Education  2018 [employer].    IF you received an x-ray today, you will receive an invoice from [facility]. Please contact [facility] at [phone] with questions or concerns regarding your invoice.   IF you received labwork today, you will receive an invoice from [facility]. Please contact [facility] at [phone] with questions or concerns regarding your invoice.   Our billing staff will not be able to assist you with questions regarding bills from these companies.  You will be contacted with the lab results as soon as they are available. The fastest way to get your results is to activate your My Chart account. Instructions are located on the last page of this paperwork. If you have not heard from [place] regarding the results in 2 weeks, please contact this office.

## 2017-06-17 ENCOUNTER — Encounter: Payer: Self-pay | Admitting: Physician Assistant

## 2017-06-21 ENCOUNTER — Encounter: Payer: Self-pay | Admitting: Physician Assistant

## 2017-06-21 ENCOUNTER — Other Ambulatory Visit: Payer: Self-pay | Admitting: Physician Assistant

## 2017-06-21 MED ORDER — AZITHROMYCIN 250 MG PO TABS: ORAL_TABLET | ORAL | 0 refills | Status: DC

## 2017-06-21 NOTE — Progress Notes (Signed)
Meds ordered this encounter  Medications  . azithromycin (ZITHROMAX) 250 MG tablet    Sig: Take 2 tabs PO x 1 dose, then 1 tab PO QD x 4 days    Dispense:  6 tablet    Refill:  0    Order Specific Question:   Supervising Provider    Answer:   Wardell Honour [[id number]]

## 2017-08-01 ENCOUNTER — Telehealth: Payer: BLUE CROSS/BLUE SHIELD | Admitting: Physician Assistant

## 2017-08-01 ENCOUNTER — Other Ambulatory Visit: Payer: Self-pay | Admitting: Physician Assistant

## 2017-08-01 DIAGNOSIS — M5387 Other specified dorsopathies, lumbosacral region: Secondary | ICD-10-CM

## 2017-08-01 DIAGNOSIS — K0889 Other specified disorders of teeth and supporting structures: Secondary | ICD-10-CM | POA: Diagnosis not present

## 2017-08-01 DIAGNOSIS — K047 Periapical abscess without sinus: Secondary | ICD-10-CM

## 2017-08-01 NOTE — Progress Notes (Signed)
Based on what you shared with me it looks like you have a serious condition that should be evaluated in a face to face office visit. Dental abscesses/infections are outside the scope of what we treat via evisit. You need in person assessment for examination so the most appropriate care can be given. Do not delay care. NOTE: If you entered your credit card information for this eVisit, you will not be charged. You may see a "hold" on your card for the $30 but that hold will drop off and you will not have a charge processed.  If you are having a true medical emergency please call 911.  If you need an urgent face to face visit, [place] has four urgent care centers for your convenience.  If you need care fast and have a high deductible or no insurance consider:   [email or web address] to reserve your spot online an avoid wait times  [facility] [address] [place], [place] [address] 8 am to 8 pm Monday-Friday 10 am to 4 pm Saturday-Sunday *Across the street from [employer]  [place], [address] 8 am to 5 pm Monday-Friday * In the [facility] on the [facility]   The following sites will take your  insurance:  . [facility] [place] a Provider at this Location  [address] [place], [place] [address] . 10 am to 8 pm Monday-Friday . 12 pm to 8 pm Saturday-Sunday   . [facility] at [place] a Provider at this Location  [address], [place], [place] [address] . 8 am to 8 pm Monday-Friday . 9 am to 6 pm Saturday . 11 am to 6 pm Sunday   . [facility] at [place] Get Driving Directions  [address] [place], [place] [address] . 8 am to 8 pm Monday-Friday . 8 am to 4 pm Saturday-Sunday   Your e-visit answers  were reviewed by a board certified advanced clinical practitioner to complete your personal care plan.  Thank you for using e-Visits.

## 2017-08-28 ENCOUNTER — Encounter: Payer: Self-pay | Admitting: Physician Assistant

## 2017-08-30 ENCOUNTER — Other Ambulatory Visit: Payer: Self-pay | Admitting: Physician Assistant

## 2017-08-30 NOTE — Progress Notes (Signed)
Letter for work

## 2017-09-02 DIAGNOSIS — M25572 Pain in left ankle and joints of left foot: Secondary | ICD-10-CM | POA: Diagnosis not present

## 2017-09-05 ENCOUNTER — Encounter: Payer: BLUE CROSS/BLUE SHIELD | Admitting: Sports Medicine

## 2017-09-11 DIAGNOSIS — E139 Other specified diabetes mellitus without complications: Secondary | ICD-10-CM | POA: Diagnosis not present

## 2017-09-11 DIAGNOSIS — M79672 Pain in left foot: Secondary | ICD-10-CM | POA: Diagnosis not present

## 2017-09-24 ENCOUNTER — Other Ambulatory Visit: Payer: Self-pay | Admitting: Physician Assistant

## 2017-09-24 DIAGNOSIS — M5387 Other specified dorsopathies, lumbosacral region: Secondary | ICD-10-CM

## 2017-09-24 NOTE — Telephone Encounter (Signed)
diclofenac LOV: 01/04/17 PCP: [place]: Jannett Celestine, [place]

## 2017-09-25 NOTE — Telephone Encounter (Signed)
Please advise/refill - diclofenac (VOLTAREN) LOV: 01/04/2017

## 2017-10-02 DIAGNOSIS — E139 Other specified diabetes mellitus without complications: Secondary | ICD-10-CM | POA: Diagnosis not present

## 2017-10-02 DIAGNOSIS — M79672 Pain in left foot: Secondary | ICD-10-CM | POA: Diagnosis not present

## 2017-10-02 DIAGNOSIS — M779 Enthesopathy, unspecified: Secondary | ICD-10-CM | POA: Diagnosis not present

## 2017-10-02 DIAGNOSIS — M205X2 Other deformities of toe(s) (acquired), left foot: Secondary | ICD-10-CM | POA: Diagnosis not present

## 2017-10-07 DIAGNOSIS — Z1231 Encounter for screening mammogram for malignant neoplasm of breast: Secondary | ICD-10-CM | POA: Diagnosis not present

## 2017-10-17 ENCOUNTER — Encounter: Payer: Self-pay | Admitting: Physician Assistant

## 2017-10-22 ENCOUNTER — Ambulatory Visit: Payer: BLUE CROSS/BLUE SHIELD | Admitting: Physician Assistant

## 2017-10-22 ENCOUNTER — Other Ambulatory Visit: Payer: Self-pay

## 2017-10-22 ENCOUNTER — Encounter: Payer: Self-pay | Admitting: Physician Assistant

## 2017-10-22 VITALS — BP 120/86 | HR 81 | Temp 97.6°F | Resp 16 | Ht 67.0 in | Wt 207.4 lb

## 2017-10-22 DIAGNOSIS — N390 Urinary tract infection, site not specified: Secondary | ICD-10-CM | POA: Diagnosis not present

## 2017-10-22 DIAGNOSIS — R350 Frequency of micturition: Secondary | ICD-10-CM

## 2017-10-22 LAB — POCT URINALYSIS DIP (MANUAL ENTRY)
Blood, UA: NEGATIVE
Glucose, UA: 100 mg/dL — AB
Leukocytes, UA: NEGATIVE
Nitrite, UA: POSITIVE — AB
Spec Grav, UA: >=1.03 — AB (ref 1.010–1.025)
Urobilinogen, UA: 1 U/dL
pH, UA: 5.5 (ref 5.0–8.0)

## 2017-10-22 LAB — POC MICROSCOPIC URINALYSIS (UMFC): Mucus: ABSENT

## 2017-10-22 MED ORDER — CIPROFLOXACIN HCL 500 MG PO TABS: 500.0000 mg | ORAL_TABLET | Freq: Two times a day (BID) | ORAL | 0 refills | Status: DC

## 2017-10-22 NOTE — Progress Notes (Signed)
Angelica Sharp  MRN: [id number] DOB: 05-27-1966  PCP: Leonie Douglas, PA-C  Subjective:  Pt is a 52 year old female who presents to clinic for burning with urination x 1 week.  Patient also complains of back pain. Endorses mild pain of left flank area.  Patient denies fever, sorethroat and vaginal discharge. Patient does have a history of recurrent UTI - she gets UTI's about twice yearly. Patient does have a history of pyelonephritis "I had a kidney infection about 6 years ago. They almost put me in the hospital".  She is not sexually active.   She drinks diet mountain dew twice daily.  Drinks about 32 oz water/daily.     Review of Systems  Constitutional: Negative for chills, fatigue and fever.  Gastrointestinal: Negative for abdominal pain, diarrhea, nausea and vomiting.  Genitourinary: Positive for dysuria, flank pain, frequency and urgency. Negative for decreased urine volume, difficulty urinating, enuresis, hematuria, vaginal bleeding, vaginal discharge and vaginal pain.  Musculoskeletal: Negative for back pain.  Neurological: Negative for dizziness, light-headedness and headaches.    Patient Active Problem List   Diagnosis Date Noted  . Chronic joint pain 03/29/2015  . DM II (diabetes mellitus, type II), controlled ([place]) 10/01/2014  . Obesity 03/12/2014  . 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 Hyperlipidemia 09/10/2013  . Family history of cancer 08/30/2012  . GERD (gastroesophageal reflux disease) 07/03/2011    Current Outpatient Medications on File Prior to Visit  Medication Sig Dispense Refill  . ALPRAZolam (XANAX) 0.25 MG tablet Take 0.5-1 tablets (0.125-0.25 mg total) by mouth at bedtime as needed for anxiety. 30 tablet 0  . aspirin EC 81 MG tablet Take 81 mg by mouth daily.    . Cholecalciferol (VITAMIN D) 2000 units tablet Take 2,000 Units by mouth daily.    . diclofenac (VOLTAREN) 75 MG EC tablet TAKE 1 TABLET BY MOUTH TWICE DAILY 60 tablet 0  .  Dulaglutide (TRULICITY) 1.51 VO/1.6WV SOPN Inject 0.75 mg into the skin every Sunday.    . esomeprazole (NEXIUM) 40 MG capsule Take 1 capsule (40 mg total) by mouth daily at 12 noon. 90 capsule 3  . glucose blood test strip Use as instructed 100 each 3  . hydrochlorothiazide (HYDRODIURIL) 25 MG tablet Take 1 tablet (25 mg total) by mouth daily. 90 tablet 3  . metFORMIN (GLUCOPHAGE-XR) 750 MG 24 hr tablet TAKE ONE TABLET BY MOUTH TWICE DAILY WITH FOOD    . ondansetron (ZOFRAN ODT) 4 MG disintegrating tablet Take 1 tablet (4 mg total) by mouth every 8 (eight) hours as needed for nausea. 10 tablet 0  . polyethylene glycol (MIRALAX / GLYCOLAX) packet Take 17 g by mouth daily as needed for mild constipation.     . vitamin B-12 (CYANOCOBALAMIN) 500 MCG tablet Take 500 mcg by mouth daily.    . cyclobenzaprine (FLEXERIL) 5 MG tablet Take 1-2 tablets (5-10 mg total) by mouth 3 (three) times daily as needed for muscle spasms. (Patient not taking: Reported on 10/22/2017) 30 tablet 0  . dicyclomine (BENTYL) 20 MG tablet Take 1 tablet (20 mg total) by mouth 2 (two) times daily. (Patient not taking: Reported on 10/22/2017) 20 tablet 0  . esomeprazole (NEXIUM 24HR CLEAR MINIS) 20 MG capsule Take 40 mg by mouth daily.    . Potassium (POTASSIMIN PO) Take by mouth.    . [DISCONTINUED] calcium carbonate 200 MG capsule TWICE WEEKLY    . [DISCONTINUED] ranitidine (ZANTAC) 150 MG capsule Take 150 mg by mouth every  evening.     No current facility-administered medications on file prior to visit.     Allergies  Allergen Reactions  . Morphine And Related     Headache and hallucinations  . Septra [Sulfamethoxazole-Trimethoprim]     Increased heartrate     Objective:  BP 120/86 (BP Location: Left Arm, Patient Position: Sitting, Cuff Size: Normal)   Pulse 81   Temp 97.6 F (36.4 C) (Oral)   Resp 16   Ht 5\' 7"  (1.702 m)   Wt 207 lb 6.4 oz (94.1 kg)   LMP 08/10/2012   SpO2 96%   BMI 32.48 kg/m   Physical Exam   Constitutional: She is oriented to person, place, and time. No distress.  Abdominal: Soft. Normal appearance. There is tenderness (mild) in the periumbilical area. There is CVA tenderness (mild left flank).  Neurological: She is alert and oriented to person, place, and time.  Skin: Skin is warm and dry.  Psychiatric: Judgment normal.  Vitals reviewed.  Results for orders placed or performed in visit on 10/22/17  POCT urinalysis dipstick  Result Value Ref Range   Color, UA orange (A) yellow   Clarity, UA cloudy (A) clear   Glucose, UA =100 (A) negative mg/dL   Bilirubin, UA small (A) negative   Ketones, POC UA trace (5) (A) negative mg/dL   Spec Grav, UA >=1.030 (A) 1.010 - 1.025   Blood, UA negative negative   pH, UA 5.5 5.0 - 8.0   Protein Ur, POC trace (A) negative mg/dL   Urobilinogen, UA 1.0 0.2 or 1.0 E.U./dL   Nitrite, UA Positive (A) Negative   Leukocytes, UA Negative Negative  POCT Microscopic Urinalysis ([facility])  Result Value Ref Range   WBC,UR,HPF,POC Moderate (A) None WBC/hpf   RBC,UR,HPF,POC None None RBC/hpf   Bacteria Few (A) None, Too numerous to count   Mucus Absent Absent   Epithelial Cells, UR Per Microscopy Moderate (A) None, Too numerous to count cells/hpf    Assessment and Plan :  1. Urinary tract infection without hematuria, site unspecified - ciprofloxacin (CIPRO) 500 MG tablet; Take 1 tablet (500 mg total) by mouth 2 (two) times daily.  Dispense: 10 tablet; Refill: 0 - pt presents with urinary frequency, urgency and flank pain x 1 week. Her vitals are within normal limits and PE is mild. UA and micro are highly suspicious for UTI, however no casts are preset - the Culture is pending. Plan to treat for complicated UTI with cipro x 5 days. RTC if no improvement of symptoms. She understands and agrees with plan.  2. Urinary frequency - POCT urinalysis dipstick - POCT Microscopic Urinalysis ([facility]) - Urine Culture    Mercer Pod, PA-C  Primary Care at  [place] 10/22/2017 9:45 AM

## 2017-10-22 NOTE — Patient Instructions (Addendum)
Start taking Ciprofloxacin twice daily for the next 5 days. Take then ENTIRE COURSE of this medication, even if you start feeling better sooner.  Stay well hydrated. Try to cut back on your soda intake.   Come back if your symptoms fail to improve.    Urinary Tract Infection, Adult A urinary tract infection (UTI) is an infection of any part of the urinary tract. The urinary tract includes the:  Kidneys.  Ureters.  Bladder.  Urethra.  These organs make, store, and get rid of pee (urine) in the body. Follow these instructions at home:  Take over-the-counter and prescription medicines only as told by your doctor.  If you were prescribed an antibiotic medicine, take it as told by your doctor. Do not stop taking the antibiotic even if you start to feel better.  Avoid the following drinks: ? Alcohol. ? Caffeine. ? Tea. ? Carbonated drinks.  Drink enough fluid to keep your pee clear or pale yellow.  Keep all follow-up visits as told by your doctor. This is important.  Make sure to: ? Empty your bladder often and completely. Do not to hold pee for long periods of time. ? Empty your bladder before and after sex. ? Wipe from front to back after a bowel movement if you are female. Use each tissue one time when you wipe. Contact a doctor if:  You have back pain.  You have a fever.  You feel sick to your stomach (nauseous).  You throw up (vomit).  Your symptoms do not get better after 3 days.  Your symptoms go away and then come back. Get help right away if:  You have very bad back pain.  You have very bad lower belly (abdominal) pain.  You are throwing up and cannot keep down any medicines or water. This information is not intended to replace advice given to you by your health care provider. Make sure you discuss any questions you have with your health care provider. Document Released: 11/14/2007 Document Revised: 11/03/2015 Document Reviewed: 04/18/2015 Elsevier  Interactive Patient Education  [employer].  Thank you for coming in today. I hope you feel we met your needs.  Feel free to call PCP if you have any questions or further requests.  Please consider signing up for MyChart if you do not already have it, as this is a great way to communicate with me.  Best,  Whitney McVey, PA-C   IF you received an x-ray today, you will receive an invoice from [facility]. Please contact [facility] Radiology at [phone] with questions or concerns regarding your invoice.   IF you received labwork today, you will receive an invoice from [place]. Please contact [facility] at [phone] with questions or concerns regarding your invoice.   Our billing staff will not be able to assist you with questions regarding bills from these companies.  You will be contacted with the lab results as soon as they are available. The fastest way to get your results is to activate your My Chart account. Instructions are located on the last page of this paperwork. If you have not heard from [place] regarding the results in 2 weeks, please contact this office.

## 2017-10-24 LAB — URINE CULTURE

## 2017-10-28 DIAGNOSIS — E119 Type 2 diabetes mellitus without complications: Secondary | ICD-10-CM | POA: Diagnosis not present

## 2017-11-23 ENCOUNTER — Encounter: Payer: Self-pay | Admitting: Physician Assistant

## 2017-11-23 DIAGNOSIS — M79673 Pain in unspecified foot: Secondary | ICD-10-CM | POA: Insufficient documentation

## 2017-11-25 ENCOUNTER — Ambulatory Visit (INDEPENDENT_AMBULATORY_CARE_PROVIDER_SITE_OTHER): Payer: BLUE CROSS/BLUE SHIELD

## 2017-11-25 ENCOUNTER — Encounter: Payer: Self-pay | Admitting: Physician Assistant

## 2017-11-25 ENCOUNTER — Ambulatory Visit: Payer: BLUE CROSS/BLUE SHIELD | Admitting: Physician Assistant

## 2017-11-25 ENCOUNTER — Other Ambulatory Visit: Payer: Self-pay

## 2017-11-25 VITALS — BP 131/79 | HR 97 | Temp 98.6°F | Resp 20 | Ht 67.21 in | Wt 202.4 lb

## 2017-11-25 DIAGNOSIS — R059 Cough, unspecified: Secondary | ICD-10-CM

## 2017-11-25 DIAGNOSIS — I10 Essential (primary) hypertension: Secondary | ICD-10-CM | POA: Insufficient documentation

## 2017-11-25 DIAGNOSIS — J181 Lobar pneumonia, unspecified organism: Secondary | ICD-10-CM

## 2017-11-25 DIAGNOSIS — R81 Glycosuria: Secondary | ICD-10-CM | POA: Diagnosis not present

## 2017-11-25 DIAGNOSIS — R05 Cough: Secondary | ICD-10-CM

## 2017-11-25 DIAGNOSIS — J189 Pneumonia, unspecified organism: Secondary | ICD-10-CM

## 2017-11-25 LAB — POCT URINALYSIS DIP (MANUAL ENTRY)
BILIRUBIN UA: NEGATIVE
Glucose, UA: NEGATIVE mg/dL
Ketones, POC UA: NEGATIVE mg/dL
Leukocytes, UA: NEGATIVE
NITRITE UA: NEGATIVE
Protein Ur, POC: NEGATIVE mg/dL
RBC UA: NEGATIVE
Urobilinogen, UA: 0.2 E.U./dL
pH, UA: 5.5 (ref 5.0–8.0)

## 2017-11-25 MED ORDER — AMOXICILLIN 500 MG PO CAPS: 1000.0000 mg | ORAL_CAPSULE | Freq: Three times a day (TID) | ORAL | 0 refills | Status: AC

## 2017-11-25 MED ORDER — AZITHROMYCIN 250 MG PO TABS: ORAL_TABLET | ORAL | 0 refills | Status: DC

## 2017-11-25 NOTE — Patient Instructions (Addendum)
Please go to 102 and have a chest xray, I will contact you later today with your results. Thank you for letting me participate in your health and well being.  Cough, Adult A cough helps to clear your throat and lungs. A cough may last only 2-3 weeks (acute), or it may last longer than 8 weeks (chronic). Many different things can cause a cough. A cough may be a sign of an illness or another medical condition. Follow these instructions at home:  Pay attention to any changes in your cough.  Take medicines only as told by your doctor. ? If you were prescribed an antibiotic medicine, take it as told by your doctor. Do not stop taking it even if you start to feel better. ? Talk with your doctor before you try using a cough medicine.  Drink enough fluid to keep your pee (urine) clear or pale yellow.  If the air is dry, use a cold steam vaporizer or humidifier in your home.  Stay away from things that make you cough at work or at home.  If your cough is worse at night, try using extra pillows to raise your head up higher while you sleep.  Do not smoke, and try not to be around smoke. If you need help quitting, ask your doctor.  Do not have caffeine.  Do not drink alcohol.  Rest as needed. Contact a doctor if:  You have new problems (symptoms).  You cough up yellow fluid (pus).  Your cough does not get better after 2-3 weeks, or your cough gets worse.  Medicine does not help your cough and you are not sleeping well.  You have pain that gets worse or pain that is not helped with medicine.  You have a fever.  You are losing weight and you do not know why.  You have night sweats. Get help right away if:  You cough up blood.  You have trouble breathing.  Your heartbeat is very fast. This information is not intended to replace advice given to you by your health care provider. Make sure you discuss any questions you have with your health care provider. Document Released:  02/08/2011 Document Revised: 11/03/2015 Document Reviewed: 08/04/2014 Elsevier Interactive Patient Education  2018 [employer].   IF you received an x-ray today, you will receive an invoice from [facility] Rad[facility]. Please contact [facility] Radiology at [phone] with questions or concerns regarding your invoice.   IF you received labwork today, you will receive an invoice from [place]. Please contact [facility] at [phone] with questions or concerns regarding your invoice.   Our billing staff will not be able to assist you with questions regarding bills from these companies.  You will be contacted with the lab results as soon as they are available. The fastest way to get your results is to activate your My Chart account. Instructions are located on the last page of this paperwork. If you have not heard from [place] regarding the results in 2 weeks, please contact this office.

## 2017-11-25 NOTE — Progress Notes (Signed)
MRN: [id number] DOB: [birth year]  Subjective:   Angelica Sharp is a 51 y.o. female with PMH T2DM, HTN, and HLD, presenting for chief complaint of Cough (X 4 days); Nasal Congestion (X 4 days); and Dizziness (X 1 day) .  Reports 4 day history of illness. Started out with nasal congestion, cough, and sinus issues. Nasal congestion and sinus issues improved but cough got worse. Felt really terrible yesterday, like she got hit by a truck. Cough is productive, thick greenish/brown sputum production. Had fever of 102, took tylenol with relief. Has chills, SOB and left upper back pain with cough. Feels dizzy if she stands up too quickly. Has been drinking water daily Has tried delsym and tylenol with some relief. Denies sinus pain, ear pain, sore throat, wheezing, chest pain and myalgia, nausea, vomiting, abdominal pain and diarrhea. Had sick contact with children.  No history of seasonal allergies, no history of asthma or COPD. Patient did not get flu shot this season. Denies smoking. No recent hospitalizations. Did complete a round of abx (cipro) last month for UTI. Denies any other aggravating or relieving factors, no other questions or concerns.  Angelica Sharp has a current medication list which includes the following prescription(s): alprazolam, aspirin ec, vitamin d, cyclobenzaprine, diclofenac, dicyclomine, dulaglutide, esomeprazole, glucose blood, hydrochlorothiazide, metformin, ondansetron, polyethylene glycol, potassium, pravastatin, vitamin b-12, ciprofloxacin, esomeprazole, calcium carbonate, and ranitidine. Also is allergic to morphine and related and septra [sulfamethoxazole-trimethoprim].  Angelica Sharp  has a past medical history of Allergy, Anemia, Anxiety, Arthritis, Borderline hypertension, Chest pain, Degenerative lumbar disc, Diabetes mellitus without complication ([place]), Diverticulitis, Edema, Family history of anesthesia complication, GERD (gastroesophageal reflux disease), H/O hiatal hernia,  Hypertension, and SOB (shortness of breath). Also  has a past surgical history that includes Tubal ligation; Breast lumpectomy; Laparoscopic assisted vaginal hysterectomy (N/A, 09/18/2012); Abdominal hysterectomy; and Cesarean section.   Objective:   Vitals: BP 131/79 (BP Location: Left Arm, Patient Position: Sitting, Cuff Size: Normal)   Pulse 97   Temp 98.6 F (37 C) (Oral)   Resp 20   Ht 5' 7.21" (1.707 m)   Wt 202 lb 6.4 oz (91.8 kg)   LMP 08/10/2012   SpO2 96%   BMI 31.51 kg/m   Physical Exam  Constitutional: She is oriented to person, place, and time. She appears well-developed and well-nourished. No distress.  HENT:  Head: Normocephalic and atraumatic.  Right Ear: Tympanic membrane, external ear and ear canal normal.  Left Ear: Tympanic membrane, external ear and ear canal normal.  Nose: Mucosal edema (mild b/l) present. Right sinus exhibits no maxillary sinus tenderness and no frontal sinus tenderness. Left sinus exhibits no maxillary sinus tenderness and no frontal sinus tenderness.  Mouth/Throat: Uvula is midline and mucous membranes are normal. No posterior oropharyngeal edema, posterior oropharyngeal erythema or tonsillar abscesses. No tonsillar exudate.  Eyes: Conjunctivae are normal.  Neck: Normal range of motion.  Cardiovascular: Normal rate, regular rhythm, normal heart sounds and intact distal pulses.  Pulmonary/Chest: Effort normal. She has no decreased breath sounds. She has no wheezes. She has no rhonchi. She has no rales.  Difficult lung exam due to coughing.  Lymphadenopathy:       Head (right side): No submental, no submandibular, no tonsillar, no preauricular, no posterior auricular and no occipital adenopathy present.       Head (left side): No submental, no submandibular, no tonsillar, no preauricular, no posterior auricular and no occipital adenopathy present.    She has no cervical adenopathy.  Right: No supraclavicular adenopathy present.        Left: No supraclavicular adenopathy present.  Neurological: She is alert and oriented to person, place, and time.  Skin: Skin is warm and dry.  Psychiatric: She has a normal mood and affect.  Vitals reviewed.   Results for orders placed or performed in visit on 11/25/17 (from the past 24 hour(s))  POCT urinalysis dipstick     Status: Abnormal   Collection Time: 11/25/17 10:42 AM  Result Value Ref Range   Color, UA yellow yellow   Clarity, UA clear clear   Glucose, UA negative negative mg/dL   Bilirubin, UA negative negative   Ketones, POC UA negative negative mg/dL   Spec Grav, UA <=1.005 (A) 1.010 - 1.025   Blood, UA negative negative   pH, UA 5.5 5.0 - 8.0   Protein Ur, POC negative negative mg/dL   Urobilinogen, UA 0.2 0.2 or 1.0 E.U./dL   Nitrite, UA Negative Negative   Leukocytes, UA Negative Negative   Pulse Readings from Last 3 Encounters:  11/25/17 97  10/22/17 81  06/10/17 91   Orthostatic VS for the past 24 hrs:  BP- Lying Pulse- Lying BP- Sitting Pulse- Sitting BP- Standing at 0 minutes Pulse- Standing at 0 minutes  11/25/17 1023 126/82 90 123/82 99 104/66 108    Dg Chest 2 View  Result Date: 11/25/2017 CLINICAL DATA:  Productive cough and fever for the past 4 days. EXAM: CHEST - 2 VIEW COMPARISON:  Chest x-ray dated February 15, 2016. FINDINGS: The heart size and mediastinal contours are within normal limits. Normal pulmonary vascularity. New patchy opacity in the left upper lobe. No pleural effusion or pneumothorax. Insert both IMPRESSION: Left upper lobe bronchopneumonia. Followup PA and lateral chest X-ray is recommended in 3-4 weeks following trial of antibiotic therapy to ensure resolution. Electronically Signed   By: Titus Dubin M.D.   On: 11/25/2017 11:14     Assessment and Plan :  1. Pneumonia of left upper lobe due to infectious organism ([place]) Pt is overall well-appearing, no acute distress.  Vitals stable.  Chest x-ray with left upper lobe  bronchopneumonia.  Due to comorbidities and recent antibiotic use, will treat with combination antibiotic regimen at this time.  Patient educated on pneumonia.  Recommended rest, light meals, appropriate hygiene can continue with Delsym and Tylenol as needed.  Plan to follow-up in office in 2448 hrs. for reevaluation.  We will repeat x-ray in 4 weeks. Given strict ED precuations.  - azithromycin (ZITHROMAX) 250 MG tablet; Take 2 tabs PO x 1 dose, then 1 tab PO QD x 4 days  Dispense: 6 tablet; Refill: 0 - amoxicillin (AMOXIL) 500 MG capsule; Take 2 capsules (1,000 mg total) by mouth 3 (three) times daily for 5 days.  Dispense: 30 capsule; Refill: 0  2. Cough - DG Chest 2 View; Future  3. Glucosuria Resolved after tx of UTI. UA today normal.  - POCT urinalysis dipstick  Tenna Delaine, PA-C  Primary Care at [place] 11/25/2017 11:17 AM

## 2017-11-26 ENCOUNTER — Encounter: Payer: Self-pay | Admitting: Physician Assistant

## 2017-11-26 ENCOUNTER — Ambulatory Visit: Payer: BLUE CROSS/BLUE SHIELD | Admitting: Physician Assistant

## 2017-11-26 VITALS — BP 110/72 | HR 91 | Temp 98.5°F | Resp 16 | Ht 67.52 in | Wt 201.0 lb

## 2017-11-26 DIAGNOSIS — J181 Lobar pneumonia, unspecified organism: Secondary | ICD-10-CM | POA: Diagnosis not present

## 2017-11-26 DIAGNOSIS — J189 Pneumonia, unspecified organism: Secondary | ICD-10-CM

## 2017-11-26 NOTE — Patient Instructions (Addendum)
Complete antibiotics. Experiment with both mucinex and tessalon perles. Continue to rest and stay hydrated. Return to clinic if symptoms worsen, do not improve after 1-2 weeks, or as needed. Otherwise, follow up in 4 weeks for repeat xray. Thank you for letting me participate in your health and well being.    Community-Acquired Pneumonia, Adult Pneumonia is an infection of the lungs. One type of pneumonia can happen while a person is in a hospital. A different type can happen when a person is not in a hospital (community-acquired pneumonia). It is easy for this kind to spread from person to person. It can spread to you if you breathe near an infected person who coughs or sneezes. Some symptoms include:  A dry cough.  A wet (productive) cough.  Fever.  Sweating.  Chest pain.  Follow these instructions at home:  Take over-the-counter and prescription medicines only as told by your doctor. ? Only take cough medicine if you are losing sleep. ? If you were prescribed an antibiotic medicine, take it as told by your doctor. Do not stop taking the antibiotic even if you start to feel better.  Sleep with your head and neck raised (elevated). You can do this by putting a few pillows under your head, or you can sleep in a recliner.  Do not use tobacco products. These include cigarettes, chewing tobacco, and e-cigarettes. If you need help quitting, ask your doctor.  Drink enough water to keep your pee (urine) clear or pale yellow. A shot (vaccine) can help prevent pneumonia. Shots are often suggested for:  People older than 54 years of age.  People older than 54 years of age: ? Who are having cancer treatment. ? Who have long-term (chronic) lung disease. ? Who have problems with their body's defense system (immune system).  You may also prevent pneumonia if you take these actions:  Get the flu (influenza) shot every year.  Go to the dentist as often as told.  Wash your hands often. If  soap and water are not available, use hand sanitizer.  Contact a doctor if:  You have a fever.  You lose sleep because your cough medicine does not help. Get help right away if:  You are short of breath and it gets worse.  You have more chest pain.  Your sickness gets worse. This is very serious if: ? You are an older adult. ? Your body's defense system is weak.  You cough up blood. This information is not intended to replace advice given to you by your health care provider. Make sure you discuss any questions you have with your health care provider. Document Released: 11/14/2007 Document Revised: 11/03/2015 Document Reviewed: 09/22/2014 Elsevier Interactive Patient Education  2018 [employer].   IF you received an x-ray today, you will receive an invoice from [facility]. Please contact [facility] at [phone] with questions or concerns regarding your invoice.   IF you received labwork today, you will receive an invoice from [facility]. Please contact [facility] at [phone] with questions or concerns regarding your invoice.   Our billing staff will not be able to assist you with questions regarding bills from these companies.  You will be contacted with the lab results as soon as they are available. The fastest way to get your results is to activate your My Chart account. Instructions are located on the last page of this paperwork. If you have not heard from [place] regarding the results in 2 weeks, please contact this office.

## 2017-11-26 NOTE — Progress Notes (Signed)
Angelica Sharp  MRN: [id number] DOB: 05/11/1966  Subjective:  Angelica Sharp is a 52 y.o. female seen in office today for a chief complaint of follow-up on pneumonia.  Patient initially evaluated by me yesterday in office.  Plain films consistent with left upper lobe pneumonia.  Due to comorbidities and recent antibiotic use she was treated with both amoxicillin and azithromycin.  Recommended symptomatic treatment for cough.  Advised to follow-up in 24 to 48 hours for reevaluation.  Today, she notes she may be feels slightly better than yesterday.  Is not quite been 24 hours since she was last seen.  She is only gotten 3 doses of amoxicillin and  2 doses of azithromycin in her system.  She has not tried any medication for symptomatic relief.  Cough is still persistent, has not worsened, feels like it is breaking up in her chest.  Fever has resolved.  She is still having left upper back pain with cough, shortness of breath has improved.  Still fatigued.  Denies wheezing, chest pain, body aches, nausea, vomiting, abdominal pain, diarrhea, constipation.  She is eating light meals and drinking water.  No other questions or concerns today.  Review of Systems  Per HPI  Patient Active Problem List   Diagnosis Date Noted  . Essential hypertension 11/25/2017  . Foot pain 11/23/2017  . Chronic joint pain 03/29/2015  . DM II (diabetes mellitus, type II), controlled ([place]) 10/01/2014  . Obesity 03/12/2014  . 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 Hyperlipidemia 09/10/2013  . Family history of cancer 08/30/2012  . GERD (gastroesophageal reflux disease) 07/03/2011    Current Outpatient Medications on File Prior to Visit  Medication Sig Dispense Refill  . ALPRAZolam (XANAX) 0.25 MG tablet Take 0.5-1 tablets (0.125-0.25 mg total) by mouth at bedtime as needed for anxiety. 30 tablet 0  . amoxicillin (AMOXIL) 500 MG capsule Take 2 capsules (1,000 mg total) by mouth 3 (three) times daily for 5 days. 30  capsule 0  . aspirin EC 81 MG tablet Take 81 mg by mouth daily.    Marland Kitchen azithromycin (ZITHROMAX) 250 MG tablet Take 2 tabs PO x 1 dose, then 1 tab PO QD x 4 days 6 tablet 0  . Cholecalciferol (VITAMIN D) 2000 units tablet Take 2,000 Units by mouth daily.    . ciprofloxacin (CIPRO) 500 MG tablet Take 1 tablet (500 mg total) by mouth 2 (two) times daily. 10 tablet 0  . cyclobenzaprine (FLEXERIL) 5 MG tablet Take 1-2 tablets (5-10 mg total) by mouth 3 (three) times daily as needed for muscle spasms. 30 tablet 0  . diclofenac (VOLTAREN) 75 MG EC tablet TAKE 1 TABLET BY MOUTH TWICE DAILY 60 tablet 0  . dicyclomine (BENTYL) 20 MG tablet Take 1 tablet (20 mg total) by mouth 2 (two) times daily. 20 tablet 0  . Dulaglutide (TRULICITY) 3.35 KT/6.2BW SOPN Inject 0.75 mg into the skin every Sunday.    . esomeprazole (NEXIUM 24HR CLEAR MINIS) 20 MG capsule Take 40 mg by mouth daily.    Marland Kitchen esomeprazole (NEXIUM) 40 MG capsule Take 1 capsule (40 mg total) by mouth daily at 12 noon. 90 capsule 3  . glucose blood test strip Use as instructed 100 each 3  . hydrochlorothiazide (HYDRODIURIL) 25 MG tablet Take 1 tablet (25 mg total) by mouth daily. 90 tablet 3  . metFORMIN (GLUCOPHAGE-XR) 750 MG 24 hr tablet TAKE ONE TABLET BY MOUTH TWICE DAILY WITH FOOD    . ondansetron (ZOFRAN ODT) 4  MG disintegrating tablet Take 1 tablet (4 mg total) by mouth every 8 (eight) hours as needed for nausea. 10 tablet 0  . polyethylene glycol (MIRALAX / GLYCOLAX) packet Take 17 g by mouth daily as needed for mild constipation.     . Potassium (POTASSIMIN PO) Take by mouth.    . pravastatin (PRAVACHOL) 40 MG tablet Take 40 mg by mouth daily.    . vitamin B-12 (CYANOCOBALAMIN) 500 MCG tablet Take 500 mcg by mouth daily.    . [DISCONTINUED] calcium carbonate 200 MG capsule TWICE WEEKLY    . [DISCONTINUED] ranitidine (ZANTAC) 150 MG capsule Take 150 mg by mouth every evening.     No current facility-administered medications on file prior to  visit.     Allergies  Allergen Reactions  . Morphine And Related     Headache and hallucinations  . Septra [Sulfamethoxazole-Trimethoprim]     Increased heartrate     Objective:  BP 110/72   Pulse 91   Temp 98.5 F (36.9 C) (Oral)   Resp 16   Ht 5' 7.52" (1.715 m)   Wt 201 lb (91.2 kg)   LMP 08/10/2012   SpO2 97%   BMI 31.00 kg/m   Physical Exam  Constitutional: She is oriented to person, place, and time. She appears well-developed and well-nourished. No distress.  HENT:  Head: Normocephalic and atraumatic.  Eyes: Conjunctivae are normal.  Neck: Normal range of motion.  Cardiovascular: Normal rate, regular rhythm and normal heart sounds.  Pulmonary/Chest: Effort normal. No stridor. No respiratory distress. She has no decreased breath sounds. She has no wheezes. She has no rhonchi. She has no rales.  Coughing noted during lung exam.  Neurological: She is alert and oriented to person, place, and time.  Skin: Skin is warm and dry.  Psychiatric: She has a normal mood and affect.  Vitals reviewed.    Pulse Readings from Last 3 Encounters:  11/26/17 91  11/25/17 97  10/22/17 81    Assessment and Plan :  1. Pneumonia of left upper lobe due to infectious organism [facility]) Patient is overall well-appearing, no acute distress.  Vitals stable. Some of her symptoms have started to improve.  Fever has resolved.  Nothing is worsened.  She is tolerating medication well.  Recommend continue with outpatient treatment. Recommend starting either OTC Mucinex for sx relief or Rx Tessalon Perles, which she has left over from her last cough. Can also use OTC delsym at night time.  Continue resting, light meals, and oral hydration.  Advised to return to clinic if symptoms worsen, do not improve in 1-2 weeks, or as needed.  Otherwise, follow-up in 4 weeks for repeat x-ray to ensure resolution of pneumonia.   Tenna Delaine PA-C  [facility] 11/28/2017  12:34 PM

## 2017-11-28 ENCOUNTER — Encounter: Payer: Self-pay | Admitting: Physician Assistant

## 2017-12-10 ENCOUNTER — Other Ambulatory Visit: Payer: Self-pay | Admitting: Physician Assistant

## 2017-12-10 DIAGNOSIS — M5387 Other specified dorsopathies, lumbosacral region: Secondary | ICD-10-CM

## 2017-12-11 NOTE — Telephone Encounter (Signed)
Diclofenac 75 mg DR tab refill request  LOV 01/04/17 with Harrison Mons where this was addressed.  PCP:  Now Dr. Grant Fontana  [facility] [address] [place]

## 2017-12-15 ENCOUNTER — Other Ambulatory Visit: Payer: Self-pay | Admitting: Physician Assistant

## 2017-12-15 DIAGNOSIS — M5387 Other specified dorsopathies, lumbosacral region: Secondary | ICD-10-CM

## 2017-12-21 ENCOUNTER — Other Ambulatory Visit: Payer: Self-pay | Admitting: Physician Assistant

## 2017-12-21 DIAGNOSIS — E785 Hyperlipidemia, unspecified: Secondary | ICD-10-CM

## 2017-12-23 NOTE — Telephone Encounter (Signed)
Pravachol 40 mg refill request  LOV 10/24/16 with Timmothy Euler.    Had several OV for acute issues but none where this is addressed.  [facility] [place], [place]

## 2017-12-23 NOTE — Telephone Encounter (Signed)
Please advise/refill: pravastatin  See message below

## 2018-01-26 ENCOUNTER — Other Ambulatory Visit: Payer: Self-pay | Admitting: Physician Assistant

## 2018-01-26 DIAGNOSIS — I10 Essential (primary) hypertension: Secondary | ICD-10-CM

## 2018-01-26 DIAGNOSIS — R609 Edema, unspecified: Secondary | ICD-10-CM

## 2018-01-26 DIAGNOSIS — M5387 Other specified dorsopathies, lumbosacral region: Secondary | ICD-10-CM

## 2018-02-14 DIAGNOSIS — E782 Mixed hyperlipidemia: Secondary | ICD-10-CM | POA: Diagnosis not present

## 2018-02-14 DIAGNOSIS — E119 Type 2 diabetes mellitus without complications: Secondary | ICD-10-CM | POA: Diagnosis not present

## 2018-02-14 DIAGNOSIS — I10 Essential (primary) hypertension: Secondary | ICD-10-CM | POA: Diagnosis not present

## 2018-03-20 ENCOUNTER — Encounter: Payer: Self-pay | Admitting: Physician Assistant

## 2018-03-20 ENCOUNTER — Other Ambulatory Visit: Payer: Self-pay | Admitting: Physician Assistant

## 2018-03-20 ENCOUNTER — Other Ambulatory Visit: Payer: Self-pay

## 2018-03-20 DIAGNOSIS — M5387 Other specified dorsopathies, lumbosacral region: Secondary | ICD-10-CM

## 2018-03-20 MED ORDER — ESOMEPRAZOLE MAGNESIUM 40 MG PO CPDR: 40.0000 mg | DELAYED_RELEASE_CAPSULE | Freq: Every day | ORAL | 1 refills | Status: DC

## 2018-03-20 MED ORDER — ESOMEPRAZOLE MAGNESIUM 40 MG PO CPDR: 40.0000 mg | DELAYED_RELEASE_CAPSULE | Freq: Every day | ORAL | 3 refills | Status: DC

## 2018-03-25 MED ORDER — DICLOFENAC SODIUM 75 MG PO TBEC: 75.0000 mg | DELAYED_RELEASE_TABLET | Freq: Two times a day (BID) | ORAL | 0 refills | Status: DC

## 2018-04-28 DIAGNOSIS — N3001 Acute cystitis with hematuria: Secondary | ICD-10-CM | POA: Diagnosis not present

## 2018-04-28 DIAGNOSIS — R1084 Generalized abdominal pain: Secondary | ICD-10-CM | POA: Diagnosis not present

## 2018-04-28 DIAGNOSIS — R1030 Lower abdominal pain, unspecified: Secondary | ICD-10-CM | POA: Diagnosis not present

## 2018-04-30 ENCOUNTER — Emergency Department (HOSPITAL_BASED_OUTPATIENT_CLINIC_OR_DEPARTMENT_OTHER)
Admission: EM | Admit: 2018-04-30 | Discharge: 2018-04-30 | Disposition: A | Discharge status: Home / Self Care | Payer: BLUE CROSS/BLUE SHIELD | Attending: Emergency Medicine | Admitting: Emergency Medicine

## 2018-04-30 ENCOUNTER — Other Ambulatory Visit: Payer: Self-pay

## 2018-04-30 ENCOUNTER — Encounter (HOSPITAL_BASED_OUTPATIENT_CLINIC_OR_DEPARTMENT_OTHER): Payer: Self-pay | Admitting: *Deleted

## 2018-04-30 ENCOUNTER — Emergency Department (HOSPITAL_BASED_OUTPATIENT_CLINIC_OR_DEPARTMENT_OTHER): Payer: BLUE CROSS/BLUE SHIELD

## 2018-04-30 DIAGNOSIS — R1032 Left lower quadrant pain: Secondary | ICD-10-CM | POA: Insufficient documentation

## 2018-04-30 DIAGNOSIS — Z7984 Long term (current) use of oral hypoglycemic drugs: Secondary | ICD-10-CM | POA: Diagnosis not present

## 2018-04-30 DIAGNOSIS — Z79899 Other long term (current) drug therapy: Secondary | ICD-10-CM | POA: Diagnosis not present

## 2018-04-30 DIAGNOSIS — I10 Essential (primary) hypertension: Secondary | ICD-10-CM | POA: Insufficient documentation

## 2018-04-30 DIAGNOSIS — R109 Unspecified abdominal pain: Secondary | ICD-10-CM | POA: Diagnosis not present

## 2018-04-30 DIAGNOSIS — E119 Type 2 diabetes mellitus without complications: Secondary | ICD-10-CM | POA: Insufficient documentation

## 2018-04-30 DIAGNOSIS — K449 Diaphragmatic hernia without obstruction or gangrene: Secondary | ICD-10-CM | POA: Diagnosis not present

## 2018-04-30 DIAGNOSIS — Z7982 Long term (current) use of aspirin: Secondary | ICD-10-CM | POA: Diagnosis not present

## 2018-04-30 DIAGNOSIS — M545 Low back pain: Secondary | ICD-10-CM | POA: Diagnosis present

## 2018-04-30 DIAGNOSIS — N200 Calculus of kidney: Secondary | ICD-10-CM | POA: Diagnosis not present

## 2018-04-30 LAB — URINALYSIS, ROUTINE W REFLEX MICROSCOPIC
BILIRUBIN URINE: NEGATIVE
Glucose, UA: NEGATIVE mg/dL
HGB URINE DIPSTICK: NEGATIVE
Ketones, ur: NEGATIVE mg/dL
Leukocytes, UA: NEGATIVE
Nitrite: NEGATIVE
PROTEIN: NEGATIVE mg/dL
Specific Gravity, Urine: 1.01 (ref 1.005–1.030)
pH: 7 (ref 5.0–8.0)

## 2018-04-30 NOTE — ED Provider Notes (Signed)
[facility] EMERGENCY DEPARTMENT Provider Note   CSN: [id number] Arrival date & time: 04/30/18  1128     History   Chief Complaint Chief Complaint  Patient presents with  . Back Pain    HPI Angelica Sharp is a 51 y.o. female.  [place] with a chief complaint of left-sided back pain.  Going on for the past couple weeks.  Worse with certain positions.  She feels that it is sharp and starts at her lateral aspect of her back and radiates down to her groin.  Denies urinary symptoms denies fevers denies cough or congestion denies vomiting or diarrhea.  Denies trauma.  She went to urgent care a couple days ago and had lab work performed that reportedly was unremarkable.  Recommended that she get a CT scan to further evaluate her symptoms.  Since then she has had no improvement or worsening.  She denies rash.  The history is provided by the patient.  Back Pain   This is a new problem. The current episode started more than 1 week ago. The problem occurs constantly. The problem has not changed since onset.The pain is associated with no known injury. The pain is present in the lumbar spine. The quality of the pain is described as shooting. Radiates to: LLQ. The pain is at a severity of 4/10. The pain is mild. The symptoms are aggravated by bending, twisting and certain positions. Associated symptoms include abdominal pain. Pertinent negatives include no chest pain, no fever, no headaches, no bladder incontinence and no dysuria. She has tried nothing for the symptoms. The treatment provided no relief.    Past Medical History:  Diagnosis Date  . Allergy   . Anemia   . Anxiety   . Arthritis   . Borderline hypertension   . Chest pain   . Degenerative lumbar disc    L5S1  . Diabetes mellitus without complication ([place])   . Diverticulitis   . Edema    lower extremity  . Family history of anesthesia complication    sister had BP complications during anes.  Marland Kitchen GERD (gastroesophageal  reflux disease)   . H/O hiatal hernia   . Hypertension   . SOB (shortness of breath)     Patient Active Problem List   Diagnosis Date Noted  . Essential hypertension 11/25/2017  . Foot pain 11/23/2017  . Chronic joint pain 03/29/2015  . DM II (diabetes mellitus, type II), controlled ([place]) 10/01/2014  . Obesity 03/12/2014  . 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 Hyperlipidemia 09/10/2013  . Family history of cancer 08/30/2012  . GERD (gastroesophageal reflux disease) 07/03/2011    Past Surgical History:  Procedure Laterality Date  . ABDOMINAL HYSTERECTOMY    . BREAST LUMPECTOMY     3 lumps -- two from right -- one from left  . CESAREAN SECTION    . LAPAROSCOPIC ASSISTED VAGINAL HYSTERECTOMY N/A 09/18/2012   Procedure: LAPAROSCOPIC ASSISTED VAGINAL HYSTERECTOMY;  Surgeon: Linda Hedges, DO;  Location: [facility];  Service: Gynecology;  Laterality: N/A;  BLADDER  DISTENTION  . TUBAL LIGATION       OB History   None      Home Medications    Prior to Admission medications   Medication Sig Start Date End Date Taking? Authorizing Provider  aspirin EC 81 MG tablet Take 81 mg by mouth daily.   Yes [provider]  Cholecalciferol (VITAMIN D) 2000 units tablet Take 2,000 Units by mouth daily.   Yes  [provider]  diclofenac (VOLTAREN) 75 MG EC tablet Take 1 tablet (75 mg total) by mouth 2 (two) times daily. 03/25/18  Yes Tenna Delaine D, PA-C  esomeprazole (NEXIUM) 40 MG capsule Take 1 capsule (40 mg total) by mouth daily. 03/20/18  Yes Timmothy Euler, [place] D, PA-C  hydrochlorothiazide (HYDRODIURIL) 25 MG tablet TAKE 1 TABLET BY MOUTH ONCE DAILY 01/27/18  Yes Timmothy Euler, [place] D, PA-C  metFORMIN (GLUCOPHAGE-XR) 750 MG 24 hr tablet TAKE ONE TABLET BY MOUTH TWICE DAILY WITH FOOD 12/17/16  Yes [provider]  naproxen sodium (ALEVE) 220 MG tablet Take 440 mg by mouth daily as needed.   Yes [provider]  polyethylene glycol (MIRALAX / GLYCOLAX)  packet Take 17 g by mouth daily as needed for mild constipation.    Yes [provider]  Potassium (POTASSIMIN PO) Take by mouth.   Yes [provider]  pravastatin (PRAVACHOL) 40 MG tablet TAKE 1 TABLET BY MOUTH ONCE DAILY 12/24/17  Yes Timmothy Euler, [place] D, PA-C  vitamin B-12 (CYANOCOBALAMIN) 500 MCG tablet Take 500 mcg by mouth daily.   Yes [provider]  ALPRAZolam (XANAX) 0.25 MG tablet Take 0.5-1 tablets (0.125-0.25 mg total) by mouth at bedtime as needed for anxiety. 10/04/16   Tenna Delaine D, PA-C  azithromycin (ZITHROMAX) 250 MG tablet Take 2 tabs PO x 1 dose, then 1 tab PO QD x 4 days 11/25/17   Tenna Delaine D, PA-C  ciprofloxacin (CIPRO) 500 MG tablet Take 1 tablet (500 mg total) by mouth 2 (two) times daily. 10/22/17   McVey, Gelene Mink, PA-C  cyclobenzaprine (FLEXERIL) 5 MG tablet Take 1-2 tablets (5-10 mg total) by mouth 3 (three) times daily as needed for muscle spasms. 01/04/17   Harrison Mons, PA  dicyclomine (BENTYL) 20 MG tablet Take 1 tablet (20 mg total) by mouth 2 (two) times daily. 03/08/17   Larene Pickett, PA-C  Dulaglutide (TRULICITY) 8.29 FA/2.1HY SOPN Inject 0.75 mg into the skin every Sunday.    [provider]  glucose blood test strip Use as instructed 05/10/15   Harrison Mons, PA  ondansetron (ZOFRAN ODT) 4 MG disintegrating tablet Take 1 tablet (4 mg total) by mouth every 8 (eight) hours as needed for nausea. 03/08/17   Larene Pickett, PA-C  calcium carbonate 200 MG capsule TWICE WEEKLY  08/29/11  [provider]  ranitidine (ZANTAC) 150 MG capsule Take 150 mg by mouth every evening.  08/29/11  [provider]    Family History Family History  Problem Relation Age of Onset  . Heart attack Mother   . Diabetes Mother   . Hyperlipidemia Mother   . Hypertension Mother   . Heart attack Sister   . Pulmonary embolism Father   . Clotting disorder Father   . Diabetes Sister   . Kidney disease  Sister        kidney failure  . Heart disease Sister   . Cancer Sister   . Diabetes Son 74       TYPE 1    Social History Social History   Tobacco Use  . Smoking status: Never Smoker  . Smokeless tobacco: Never Used  Substance Use Topics  . Alcohol use: No  . Drug use: No     Allergies   Morphine and related and Septra [sulfamethoxazole-trimethoprim]   Review of Systems Review of Systems  Constitutional: Negative for chills and fever.  HENT: Negative for congestion and rhinorrhea.   Eyes: Negative for redness and  visual disturbance.  Respiratory: Negative for shortness of breath and wheezing.   Cardiovascular: Negative for chest pain and palpitations.  Gastrointestinal: Positive for abdominal pain. Negative for nausea and vomiting.  Genitourinary: Negative for bladder incontinence, dysuria and urgency.  Musculoskeletal: Positive for back pain. Negative for arthralgias and myalgias.  Skin: Negative for pallor and wound.  Neurological: Negative for dizziness and headaches.     Physical Exam Updated Vital Signs BP 135/87 (BP Location: Left Arm)   Pulse 86   Temp 99.1 F (37.3 C) (Oral)   Resp 18   Ht 5\' 7"  (1.702 m)   Wt 93.9 kg   LMP 08/10/2012   SpO2 96%   BMI 32.42 kg/m   Physical Exam  Constitutional: She is oriented to person, place, and time. She appears well-developed and well-nourished. No distress.  HENT:  Head: Normocephalic and atraumatic.  Eyes: Pupils are equal, round, and reactive to light. EOM are normal.  Neck: Normal range of motion. Neck supple.  Cardiovascular: Normal rate and regular rhythm. Exam reveals no gallop and no friction rub.  No murmur heard. Pulmonary/Chest: Effort normal. She has no wheezes. She has no rales.  Abdominal: Soft. She exhibits no distension and no mass. There is tenderness. There is no guarding.  Tenderness the left lower quadrant no guarding or rebound.  Musculoskeletal: She exhibits tenderness. She exhibits no  edema.  The patient is focally tender to the lateral rib margin of ribs 11 and 12.  This reproduces her pain.  There is no rash  She points to her inguinal crease is an area of pain.  There is no palpable mass, intact femoral pulse.  Neurological: She is alert and oriented to person, place, and time.  Skin: Skin is warm and dry. She is not diaphoretic.  Psychiatric: She has a normal mood and affect. Her behavior is normal.  Nursing note and vitals reviewed.    ED Treatments / Results  Labs (all labs ordered are listed, but only abnormal results are displayed) Labs Reviewed  URINALYSIS, ROUTINE W REFLEX MICROSCOPIC    EKG None  Radiology Ct Renal Stone Study  Result Date: 04/30/2018 CLINICAL DATA:  Left flank and inguinal region pain EXAM: CT ABDOMEN AND PELVIS WITHOUT CONTRAST TECHNIQUE: Multidetector CT imaging of the abdomen and pelvis was performed following the standard protocol without oral or IV contrast. COMPARISON:  March 08, 2017 FINDINGS: Lower chest: Lung apices are clear. There is a focal fairly small hiatal hernia. Hepatobiliary: There is a stable 7 mm cyst in the left lobe of the liver. No other focal liver lesions are evident on this noncontrast enhanced study. Gallbladder wall is not appreciably thickened. There is no biliary duct dilatation. Pancreas: Fatty infiltration is noted in portions of the pancreas. There is no pancreatic mass or inflammatory focus. Spleen: No splenic lesions are evident. Adrenals/Urinary Tract: Adrenals bilaterally appear unremarkable. There is an approximately 1 x 1 cm cyst arising from the posterior mid left kidney. There is no appreciable hydronephrosis on either side. There is no evident renal or ureteral calculus on either side. Urinary bladder is midline with wall thickness within normal limits. Stomach/Bowel: There are multiple descending colonic and sigmoid diverticula without appreciable diverticulitis. A single diverticulum arises from  the proximal ascending colon. There is no appreciable bowel wall or mesenteric thickening. There is no evident bowel obstruction. No free air or portal venous air. Vascular/Lymphatic: There is no abdominal aortic aneurysm. No vascular lesions are appreciable on this noncontrast enhanced study.  There is no evident adenopathy in the abdomen or pelvis by size criteria. There are occasional subcentimeter mesenteric lymph nodes which are regarded as nonspecific. Reproductive: Prostate and seminal vesicles appear normal in size and contour. There is a tiny prostatic calculus present. No pelvic mass evident. Other: Appendix appears normal. There is no abscess or ascites evident in the abdomen or pelvis. Musculoskeletal: There is a hemangioma in the leftward aspect of the L3 vertebral body. There are no blastic or lytic bone lesions. No intramuscular lesions are evident. IMPRESSION: 1. Multiple left colonic diverticula without diverticulitis. No bowel obstruction. No abscess in the abdomen or pelvis. Appendix appears normal. 2. No evident renal or ureteral calculus. No hydronephrosis. Tiny prostatic calculus noted. 3.  Hiatal hernia, fairly small, noted. Electronically Signed   By: Lowella Grip III M.D.   On: 04/30/2018 12:59    Procedures Procedures (including critical care time)  Medications Ordered in ED Medications - No data to display   Initial Impression / Assessment and Plan / ED Course  I have reviewed the triage vital signs and the nursing notes.  Pertinent labs & imaging results that were available during my care of the patient were reviewed by me and considered in my medical decision making (see chart for details).     52 yo F with a chief complaint of left low back pain.  Sounds muscular skeletal by history and physical.  She had a urine done in triage that was negative for infection or hematuria.  She was sent from urgent care for a CT scan.  I discussed with the patient the limited  utility of an urgent CT scan for her symptoms with 2 weeks of symptoms without fever or vomiting.  At this point she would like to have this study done to further evaluate her symptoms.  CT without acute finding.  D/c home.   1:33 PM:  I have discussed the diagnosis/risks/treatment options with the patient and believe the pt to be eligible for discharge home to follow-up with PCP. We also discussed returning to the ED immediately if new or worsening sx occur. We discussed the sx which are most concerning (e.g., sudden worsening pain, fever, inability to tolerate by mouth) that necessitate immediate return. Medications administered to the patient during their visit and any new prescriptions provided to the patient are listed below.  Medications given during this visit Medications - No data to display    The patient appears reasonably screen and/or stabilized for discharge and I doubt any other medical condition or other [facility] requiring further screening, evaluation, or treatment in the ED at this time prior to discharge.    Final Clinical Impressions(s) / ED Diagnoses   Final diagnoses:  Left flank pain    ED Discharge Orders    None       Deno Etienne, DO 04/30/18 1333

## 2018-04-30 NOTE — ED Triage Notes (Signed)
Pt reports left flank pain x 2 weeks. She went to Urgent Care on Monday and was advised to have a CT. States pain starts in her left flank and is also in her left groin

## 2018-04-30 NOTE — ED Notes (Signed)
NAD at this time. Pt is stable and going home.  

## 2018-04-30 NOTE — Discharge Instructions (Signed)

## 2018-05-01 ENCOUNTER — Encounter: Payer: Self-pay | Admitting: Physician Assistant

## 2018-05-02 ENCOUNTER — Ambulatory Visit: Payer: BLUE CROSS/BLUE SHIELD | Admitting: Family Medicine

## 2018-05-02 ENCOUNTER — Encounter: Payer: Self-pay | Admitting: Family Medicine

## 2018-05-02 ENCOUNTER — Ambulatory Visit (INDEPENDENT_AMBULATORY_CARE_PROVIDER_SITE_OTHER): Payer: BLUE CROSS/BLUE SHIELD

## 2018-05-02 ENCOUNTER — Other Ambulatory Visit: Payer: Self-pay

## 2018-05-02 VITALS — BP 105/69 | HR 97 | Temp 99.4°F | Ht 67.0 in | Wt 211.6 lb

## 2018-05-02 DIAGNOSIS — R059 Cough, unspecified: Secondary | ICD-10-CM

## 2018-05-02 DIAGNOSIS — R05 Cough: Secondary | ICD-10-CM | POA: Diagnosis not present

## 2018-05-02 DIAGNOSIS — R509 Fever, unspecified: Secondary | ICD-10-CM | POA: Diagnosis not present

## 2018-05-02 DIAGNOSIS — R109 Unspecified abdominal pain: Secondary | ICD-10-CM

## 2018-05-02 DIAGNOSIS — G479 Sleep disorder, unspecified: Secondary | ICD-10-CM

## 2018-05-02 DIAGNOSIS — M545 Low back pain, unspecified: Secondary | ICD-10-CM

## 2018-05-02 DIAGNOSIS — R52 Pain, unspecified: Secondary | ICD-10-CM

## 2018-05-02 LAB — POCT URINALYSIS DIP (MANUAL ENTRY)
Bilirubin, UA: NEGATIVE
GLUCOSE UA: NEGATIVE mg/dL
Ketones, POC UA: NEGATIVE mg/dL
Leukocytes, UA: NEGATIVE
Nitrite, UA: NEGATIVE
Protein Ur, POC: NEGATIVE mg/dL
Spec Grav, UA: 1.02 (ref 1.010–1.025)
UROBILINOGEN UA: 1 U/dL
pH, UA: 6.5 (ref 5.0–8.0)

## 2018-05-02 LAB — POCT CBC
Granulocyte percent: 81 %G — AB (ref 37–80)
HEMATOCRIT: 34.8 % (ref 29–41)
Hemoglobin: 11.2 g/dL (ref 9.5–13.5)
LYMPH, POC: 0.8 (ref 0.6–3.4)
MCH, POC: 23.9 pg — AB (ref 27–31.2)
MCHC: 32.3 g/dL (ref 31.8–35.4)
MCV: 74 fL — AB (ref 76–111)
MID (cbc): 0.3 (ref 0–0.9)
MPV: 7 fL (ref 0–99.8)
PLATELET COUNT, POC: 258 10*3/uL (ref 142–424)
POC GRANULOCYTE: 4.8 (ref 2–6.9)
POC LYMPH %: 14.2 % (ref 10–50)
POC MID %: 4.8 % (ref 0–12)
RBC: 4.7 M/uL (ref 4.04–5.48)
RDW, POC: 15.2 %
WBC: 5.9 10*3/uL (ref 4.6–10.2)

## 2018-05-02 LAB — POC MICROSCOPIC URINALYSIS (UMFC): MUCUS RE: ABSENT

## 2018-05-02 LAB — POC INFLUENZA A&B (BINAX/QUICKVUE)
Influenza A, POC: NEGATIVE
Influenza B, POC: NEGATIVE

## 2018-05-02 MED ORDER — ALPRAZOLAM 0.25 MG PO TABS: 0.1250 mg | ORAL_TABLET | Freq: Every evening | ORAL | 0 refills | Status: DC | PRN

## 2018-05-02 MED ORDER — AZITHROMYCIN 250 MG PO TABS: ORAL_TABLET | ORAL | 0 refills | Status: DC

## 2018-05-02 NOTE — Progress Notes (Signed)
Subjective:  By signing my name below, I, Moises Blood, attest that this documentation has been prepared under the direction and in the presence of [name], MD. Electronically Signed: Moises Blood, [place]. 05/02/2018 , 4:27 PM .  Patient was seen in Room 9 .   Patient ID: Angelica Sharp, female    DOB: 24-Oct-1965, 52 y.o.   MRN: [id number] Chief Complaint  Patient presents with  . Back Pain    x3 weeks   . lower abd pain  . Fever    101 fever most of last night   . Medication Refill    xanax   HPI Angelica Sharp is a 52 y.o. female  Patient was seen in [facility] ER on Nov 20th, presented with left sided back pain, worse with certain positions for a few weeks. She has been seen by an urgent care, reportedly a few days prior with unremarkable lab work. At [facility] 2 days ago, she had an urinalysis and a CT renal stone study. On the CT renal stone study, showed multiple diverticula without diverticulitis, no abscess, normal appendix, no evident calculi, small hiatal hernia; thought to be musculoskeletal back pain.   E-mail message from yesterday with concerns regarding CT report; fatty infiltrations in portions of pancreas but no mass, 1x1 cm left kidney cyst; there is a hemangioma in the leftward aspect of the L3 vertebral body; no blastic or lytic bone lesions.   Patient states back pain ongoing for about 3 weeks now. She describes the pain started in her left groin, and then into her left lower back to mid back. She denies seeing any rash. She informs nausea started a few days ago with fever, with Tmax 99.1 in the ER 2 days ago. Yesterday, she measured a fever of 102, and 101 throughout the night. She denies any urinary symptoms. She had a non productive cough started yesterday. She denies vaginal bleeding or diarrhea. She has had some body aches. She did not receive a flu shot yet this year.   She also requests medication refill of xanax for situational  anxiety, family members being diagnosed with cancer. Last prescription in April 2018, #30 xanax.   Patient Active Problem List   Diagnosis Date Noted  . Essential hypertension 11/25/2017  . Foot pain 11/23/2017  . Chronic joint pain 03/29/2015  . DM II (diabetes mellitus, type II), controlled ([place]) 10/01/2014  . Obesity 03/12/2014  . 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 Hyperlipidemia 09/10/2013  . Family history of cancer 08/30/2012  . GERD (gastroesophageal reflux disease) 07/03/2011   Past Medical History:  Diagnosis Date  . Allergy   . Anemia   . Anxiety   . Arthritis   . Borderline hypertension   . Chest pain   . Degenerative lumbar disc    L5S1  . Diabetes mellitus without complication ([place])   . Diverticulitis   . Edema    lower extremity  . Family history of anesthesia complication    sister had BP complications during anes.  Marland Kitchen GERD (gastroesophageal reflux disease)   . H/O hiatal hernia   . Hypertension   . SOB (shortness of breath)    Past Surgical History:  Procedure Laterality Date  . ABDOMINAL HYSTERECTOMY    . BREAST LUMPECTOMY     3 lumps -- two from right -- one from left  . CESAREAN SECTION    . LAPAROSCOPIC ASSISTED VAGINAL HYSTERECTOMY N/A 09/18/2012   Procedure: LAPAROSCOPIC ASSISTED VAGINAL HYSTERECTOMY;  Surgeon: Linda Hedges, DO;  Location: [facility];  Service: Gynecology;  Laterality: N/A;  BLADDER  DISTENTION  . TUBAL LIGATION     Allergies  Allergen Reactions  . Morphine And Related     Headache and hallucinations  . Septra [Sulfamethoxazole-Trimethoprim]     Increased heartrate   Prior to Admission medications   Medication Sig Start Date End Date Taking? Authorizing Provider  ALPRAZolam (XANAX) 0.25 MG tablet Take 0.5-1 tablets (0.125-0.25 mg total) by mouth at bedtime as needed for anxiety. 10/04/16   Leonie Douglas, PA-C  aspirin EC 81 MG tablet Take 81 mg by mouth daily.    [provider]  azithromycin (ZITHROMAX)  250 MG tablet Take 2 tabs PO x 1 dose, then 1 tab PO QD x 4 days 11/25/17   Leonie Douglas, PA-C  Cholecalciferol (VITAMIN D) 2000 units tablet Take 2,000 Units by mouth daily.    [provider]  ciprofloxacin (CIPRO) 500 MG tablet Take 1 tablet (500 mg total) by mouth 2 (two) times daily. 10/22/17   McVey, Gelene Mink, PA-C  cyclobenzaprine (FLEXERIL) 5 MG tablet Take 1-2 tablets (5-10 mg total) by mouth 3 (three) times daily as needed for muscle spasms. 01/04/17   Harrison Mons, PA  diclofenac (VOLTAREN) 75 MG EC tablet Take 1 tablet (75 mg total) by mouth 2 (two) times daily. 03/25/18   Tenna Delaine D, PA-C  dicyclomine (BENTYL) 20 MG tablet Take 1 tablet (20 mg total) by mouth 2 (two) times daily. 03/08/17   Larene Pickett, PA-C  Dulaglutide (TRULICITY) 7.56 EP/3.2RJ SOPN Inject 0.75 mg into the skin every Sunday.    [provider]  esomeprazole (NEXIUM) 40 MG capsule Take 1 capsule (40 mg total) by mouth daily. 03/20/18   Tenna Delaine D, PA-C  glucose blood test strip Use as instructed 05/10/15   Harrison Mons, PA  hydrochlorothiazide (HYDRODIURIL) 25 MG tablet TAKE 1 TABLET BY MOUTH ONCE DAILY 01/27/18   Tenna Delaine D, PA-C  metFORMIN (GLUCOPHAGE-XR) 750 MG 24 hr tablet TAKE ONE TABLET BY MOUTH TWICE DAILY WITH FOOD 12/17/16   [provider]  naproxen sodium (ALEVE) 220 MG tablet Take 440 mg by mouth daily as needed.    [provider]  ondansetron (ZOFRAN ODT) 4 MG disintegrating tablet Take 1 tablet (4 mg total) by mouth every 8 (eight) hours as needed for nausea. 03/08/17   Larene Pickett, PA-C  polyethylene glycol [facility] Floria Raveling) packet Take 17 g by mouth daily as needed for mild constipation.     [provider]  Potassium (POTASSIMIN PO) Take by mouth.    [provider]  pravastatin (PRAVACHOL) 40 MG tablet TAKE 1 TABLET BY MOUTH ONCE DAILY 12/24/17   Tenna Delaine D, PA-C  vitamin B-12  (CYANOCOBALAMIN) 500 MCG tablet Take 500 mcg by mouth daily.    [provider]  calcium carbonate 200 MG capsule TWICE WEEKLY  08/29/11  [provider]  ranitidine (ZANTAC) 150 MG capsule Take 150 mg by mouth every evening.  08/29/11  [provider]   Social History   Socioeconomic History  . Marital status: Divorced    Spouse name: Not on file  . Number of children: 3  . Years of education: Not on file  . Highest education level: Not on file  Occupational History  . 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 Financial resource strain: Not on file  . Food insecurity:    Worry: Not on file    Inability: Not on file  . Transportation needs:    Medical: Not on file    Non-medical: Not on file  Tobacco Use  . Smoking status: Never Smoker  . Smokeless tobacco: Never Used  Substance and Sexual Activity  . Alcohol use: No  . Drug use: No  . Sexual activity: Not Currently  Lifestyle  . Physical activity:    Days per week: Not on file    Minutes per session: Not on file  . Stress: Not on file  Relationships  . Social connections:    Talks on phone: Not on file    Gets together: Not on file    Attends religious service: Not on file    Active member of club or organization: Not on file    Attends meetings of clubs or organizations: Not on file    Relationship status: Not on file  . Intimate partner violence:    Fear of current or ex partner: Not on file    Emotionally abused: Not on file    Physically abused: Not on file    Forced sexual activity: Not on file  Other Topics Concern  . Not on file  Social History Narrative   Lives with daughter and grandkids.   Review of Systems  Constitutional: Positive for fever. Negative for chills, fatigue and unexpected weight change.  Respiratory: Negative for cough.   Gastrointestinal: Positive for abdominal pain. Negative  for constipation, diarrhea, nausea and vomiting.  Musculoskeletal: Positive for back pain.  Skin: Negative for rash and wound.  Neurological: Negative for dizziness, weakness and headaches.       Objective:   Physical Exam  Constitutional: She is oriented to person, place, and time. She appears well-developed and well-nourished. No distress.  HENT:  Head: Normocephalic and atraumatic.  Eyes: Pupils are equal, round, and reactive to light. EOM are normal.  Neck: Neck supple.  Cardiovascular: Normal rate.  Pulmonary/Chest: Effort normal. No respiratory distress.  Possible coarse breath sounds left lobe base  Musculoskeletal: Normal range of motion.  Minimal tenderness over left paraspinals, describes area of pain located at left chest wall and left flank  Neurological: She is alert and oriented to person, place, and time.  Skin: Skin is warm and dry.  Psychiatric: She has a normal mood and affect. Her behavior is normal.  Nursing note and vitals reviewed.   Vitals:   05/02/18 1526  BP: 105/69  Pulse: 97  Temp: 99.4 F (37.4 C)  TempSrc: Oral  SpO2: 98%  Weight: 211 lb 9.6 oz (96 kg)  Height: 5\' 7"  (1.702 m)   Results for orders placed or performed in visit on 05/02/18  POCT CBC  Result Value Ref Range   WBC 5.9 4.6 - 10.2 K/uL   Lymph, poc 0.8 0.6 - 3.4   POC LYMPH PERCENT 14.2 10 - 50 %L   MID (cbc) 0.3 0 - 0.9   POC MID % 4.8 0 - 12 %M   POC Granulocyte 4.8 2 - 6.9   Granulocyte percent 81.0 (A) 37 - 80 %G   RBC 4.70 4.04 - 5.48 M/uL   Hemoglobin 11.2 9.5 - 13.5 g/dL   HCT, POC 34.8 29 - 41 %   MCV 74.0 (A) 76 - 111 fL   MCH, POC 23.9 (A) 27 - 31.2 pg   MCHC 32.3 31.8 -  35.4 g/dL   RDW, POC 15.2 %   Platelet Count, POC 258 142 - 424 K/uL   MPV 7.0 0 - 99.8 fL  POC Influenza A&B(BINAX/QUICKVUE)  Result Value Ref Range   Influenza A, POC Negative Negative   Influenza B, POC Negative Negative  POCT urinalysis dipstick  Result Value Ref Range   Color, UA  yellow yellow   Clarity, UA clear clear   Glucose, UA negative negative mg/dL   Bilirubin, UA negative negative   Ketones, POC UA negative negative mg/dL   Spec Grav, UA 1.020 1.010 - 1.025   Blood, UA trace-intact (A) negative   pH, UA 6.5 5.0 - 8.0   Protein Ur, POC negative negative mg/dL   Urobilinogen, UA 1.0 0.2 or 1.0 E.U./dL   Nitrite, UA Negative Negative   Leukocytes, UA Negative Negative  POCT Microscopic Urinalysis ([facility])  Result Value Ref Range   WBC,UR,HPF,POC None None WBC/hpf   RBC,UR,HPF,POC None None RBC/hpf   Bacteria None None, Too numerous to count   Mucus Absent Absent   Epithelial Cells, UR Per Microscopy None None, Too numerous to count cells/hpf   Dg Chest 2 View  Result Date: 05/02/2018 CLINICAL DATA:  Cough and body aches with fever EXAM: CHEST - 2 VIEW COMPARISON:  11/25/2017 FINDINGS: Normal heart size and mediastinal contours. No acute infiltrate or edema. No effusion or pneumothorax. No acute osseous findings. IMPRESSION: Negative chest. Electronically Signed   By: Monte Fantasia M.D.   On: 05/02/2018 16:57        Assessment & Plan:   Angelica Sharp is a 52 y.o. female Left-sided low back pain without sciatica, unspecified chronicity Body aches - Plan: POC Influenza A&B(BINAX/QUICKVUE) Fever, unspecified - Plan: POCT CBC, POC Influenza A&B(BINAX/QUICKVUE), POCT urinalysis dipstick, POCT Microscopic Urinalysis ([facility]), azithromycin (ZITHROMAX) 250 MG tablet Cough - Plan: DG Chest 2 View, POCT CBC, azithromycin (ZITHROMAX) 250 MG tablet Left flank pain - Plan: POCT urinalysis dipstick, POCT Microscopic Urinalysis ([facility]), azithromycin (ZITHROMAX) 250 MG tablet  -Based on recent cough, and exam concerning for possible early left lower lobe pneumonia.  Afebrile, normal WBC but slight left shift.  Overall reassuring x-ray.  Recent abdominal CT/renal stone study was reassuring.  -Start Z-Pak for possible early left lower lobe pneumonia, with close  follow-up/RTC precautions discussed.  Sleep disturbance - Plan: ALPRAZolam (XANAX) 0.25 MG tablet  -Situational stressors discussed as above.  Refilled counseling temporarily as advised to return to discuss those symptoms further as other resources can be provided such as counseling if needed.   Meds ordered this encounter  Medications  . ALPRAZolam (XANAX) 0.25 MG tablet    Sig: Take 0.5-1 tablets (0.125-0.25 mg total) by mouth at bedtime as needed for anxiety.    Dispense:  20 tablet    Refill:  0  . azithromycin (ZITHROMAX) 250 MG tablet    Sig: Take 2 pills by mouth on day 1, then 1 pill by mouth per day on days 2 through 5.    Dispense:  6 tablet    Refill:  0   Patient Instructions   Based on using early pneumonia is possible but I do not see one on x-ray.  Okay to start azithromycin at this time, but if you are not improving in the next 3 to 4 days, any persistent fevers, or worsening abdominal symptoms, be seen here or emergency room sooner.   Tylenol as needed for body aches or fever.  I am sorry to hear about your  current stressors.  I did refill the Xanax temporarily, but please follow-up with [place] to discuss those symptoms further.  Fever, Adult A fever is an increase in the body's temperature. It is usually defined as a temperature of 100F (38C) or higher. Brief mild or moderate fevers generally have no long-term effects, and they often do not require treatment. Moderate or high fevers may make you feel uncomfortable and can sometimes be a sign of a serious illness or disease. The sweating that may occur with repeated or prolonged fever may also cause dehydration. Fever is confirmed by taking a temperature with a thermometer. A measured temperature can vary with:  Age.  Time of day.  Location of the thermometer: ? Mouth (oral). ? Rectum (rectal). ? Ear (tympanic). ? Underarm (axillary). ? Forehead (temporal).  Follow these instructions at home: Pay attention to  any changes in your symptoms. Take these actions to help with your condition:  Take over-the counter and prescription medicines only as told by your health care provider. Follow the dosing instructions carefully.  If you were prescribed an antibiotic medicine, take it as told by your health care provider. Do not stop taking the antibiotic even if you start to feel better.  Rest as needed.  Drink enough fluid to keep your urine clear or pale yellow. This helps to prevent dehydration.  Sponge yourself or bathe with room-temperature water to help reduce your body temperature as needed. Do not use ice water.  Do not overbundle yourself in blankets or heavy clothes.  Contact a health care provider if:  You vomit.  You cannot eat or drink without vomiting.  You have diarrhea.  You have pain when you urinate.  Your symptoms do not improve with treatment.  You develop new symptoms.  You develop excessive weakness. Get help right away if:  You have shortness of breath or have trouble breathing.  You are dizzy or you faint.  You are disoriented or confused.  You develop signs of dehydration, such as a dry mouth, decreased urination, or paleness.  You develop severe pain in your abdomen.  You have persistent vomiting or diarrhea.  You develop a skin rash.  Your symptoms suddenly get worse. This information is not intended to replace advice given to you by your health care provider. Make sure you discuss any questions you have with your health care provider. Document Released: 11/21/2000 Document Revised: 11/03/2015 Document Reviewed: 07/22/2014 Elsevier Interactive Patient Education  [employer].   If you have lab work done today you will be contacted with your lab results within the next 2 weeks.  If you have not heard from [place] then please contact us. The fastest way to get your results is to register for My Chart.   IF you received an x-ray today, you will receive  an invoice from [facility]. Please contact [facility] at [phone] with questions or concerns regarding your invoice.   IF you received labwork today, you will receive an invoice from [place]. Please contact [facility] at [phone] with questions or concerns regarding your invoice.   Our billing staff will not be able to assist you with questions regarding bills from these companies.  You will be contacted with the lab results as soon as they are available. The fastest way to get your results is to activate your My Chart account. Instructions are located on the last page of this paperwork. If you have not heard from [place] regarding the results in 2 weeks, please  contact this office.       I personally performed the services described in this documentation, which was scribed in my presence. The recorded information has been reviewed and considered for accuracy and completeness, addended by me as needed, and agree with information above.  Signed,   [name], MD [facility] [place].  05/06/18 8:46 PM

## 2018-05-02 NOTE — Patient Instructions (Addendum)
Based on using early pneumonia is possible but I do not see one on x-ray.  Okay to start azithromycin at this time, but if you are not improving in the next 3 to 4 days, any persistent fevers, or worsening abdominal symptoms, be seen here or emergency room sooner.   Tylenol as needed for body aches or fever.  I am sorry to hear about your current stressors.  I did refill the Xanax temporarily, but please follow-up with [place] to discuss those symptoms further.  Fever, Adult A fever is an increase in the body's temperature. It is usually defined as a temperature of 100F (38C) or higher. Brief mild or moderate fevers generally have no long-term effects, and they often do not require treatment. Moderate or high fevers may make you feel uncomfortable and can sometimes be a sign of a serious illness or disease. The sweating that may occur with repeated or prolonged fever may also cause dehydration. Fever is confirmed by taking a temperature with a thermometer. A measured temperature can vary with:  Age.  Time of day.  Location of the thermometer: ? Mouth (oral). ? Rectum (rectal). ? Ear (tympanic). ? Underarm (axillary). ? Forehead (temporal).  Follow these instructions at home: Pay attention to any changes in your symptoms. Take these actions to help with your condition:  Take over-the counter and prescription medicines only as told by your health care provider. Follow the dosing instructions carefully.  If you were prescribed an antibiotic medicine, take it as told by your health care provider. Do not stop taking the antibiotic even if you start to feel better.  Rest as needed.  Drink enough fluid to keep your urine clear or pale yellow. This helps to prevent dehydration.  Sponge yourself or bathe with room-temperature water to help reduce your body temperature as needed. Do not use ice water.  Do not overbundle yourself in blankets or heavy clothes.  Contact a health care provider  if:  You vomit.  You cannot eat or drink without vomiting.  You have diarrhea.  You have pain when you urinate.  Your symptoms do not improve with treatment.  You develop new symptoms.  You develop excessive weakness. Get help right away if:  You have shortness of breath or have trouble breathing.  You are dizzy or you faint.  You are disoriented or confused.  You develop signs of dehydration, such as a dry mouth, decreased urination, or paleness.  You develop severe pain in your abdomen.  You have persistent vomiting or diarrhea.  You develop a skin rash.  Your symptoms suddenly get worse. This information is not intended to replace advice given to you by your health care provider. Make sure you discuss any questions you have with your health care provider. Document Released: 11/21/2000 Document Revised: 11/03/2015 Document Reviewed: 07/22/2014 Elsevier Interactive Patient Education  [employer].   If you have lab work done today you will be contacted with your lab results within the next 2 weeks.  If you have not heard from [place] then please contact us. The fastest way to get your results is to register for My Chart.   IF you received an x-ray today, you will receive an invoice from [facility]. Please contact [facility] at [phone] with questions or concerns regarding your invoice.   IF you received labwork today, you will receive an invoice from [place]. Please contact [facility] at [phone] with questions or concerns regarding your invoice.   Our billing staff  will not be able to assist you with questions regarding bills from these companies.  You will be contacted with the lab results as soon as they are available. The fastest way to get your results is to activate your My Chart account. Instructions are located on the last page of this paperwork. If you have not heard from [place] regarding the results in 2 weeks, please contact this  office.

## 2018-05-06 ENCOUNTER — Encounter: Payer: Self-pay | Admitting: Family Medicine

## 2018-05-15 ENCOUNTER — Other Ambulatory Visit: Payer: Self-pay | Admitting: Physician Assistant

## 2018-05-15 DIAGNOSIS — M5387 Other specified dorsopathies, lumbosacral region: Secondary | ICD-10-CM

## 2018-05-15 NOTE — Telephone Encounter (Signed)
Left VM; pt needs TOC appt.

## 2018-05-15 NOTE — Telephone Encounter (Signed)
Courtesy refill until appointment 06/23/18.

## 2018-06-18 ENCOUNTER — Other Ambulatory Visit: Payer: Self-pay

## 2018-06-18 ENCOUNTER — Encounter: Payer: Self-pay | Admitting: Physician Assistant

## 2018-06-18 ENCOUNTER — Ambulatory Visit: Payer: BLUE CROSS/BLUE SHIELD | Admitting: Physician Assistant

## 2018-06-18 ENCOUNTER — Ambulatory Visit (INDEPENDENT_AMBULATORY_CARE_PROVIDER_SITE_OTHER): Payer: BLUE CROSS/BLUE SHIELD

## 2018-06-18 VITALS — BP 134/84 | HR 108 | Temp 98.5°F | Resp 16 | Ht 67.0 in | Wt 207.6 lb

## 2018-06-18 DIAGNOSIS — R509 Fever, unspecified: Secondary | ICD-10-CM | POA: Diagnosis not present

## 2018-06-18 DIAGNOSIS — R05 Cough: Secondary | ICD-10-CM

## 2018-06-18 DIAGNOSIS — R059 Cough, unspecified: Secondary | ICD-10-CM

## 2018-06-18 MED ORDER — BENZONATATE 200 MG PO CAPS: 200.0000 mg | ORAL_CAPSULE | Freq: Three times a day (TID) | ORAL | 1 refills | Status: DC | PRN

## 2018-06-18 MED ORDER — PREDNISONE 20 MG PO TABS: ORAL_TABLET | ORAL | 0 refills | Status: DC

## 2018-06-18 MED ORDER — HYDROCODONE-HOMATROPINE 5-1.5 MG/5ML PO SYRP: 5.0000 mL | ORAL_SOLUTION | Freq: Three times a day (TID) | ORAL | 0 refills | Status: DC | PRN

## 2018-06-18 MED ORDER — DOXYCYCLINE HYCLATE 100 MG PO TABS: 100.0000 mg | ORAL_TABLET | Freq: Two times a day (BID) | ORAL | 0 refills | Status: DC

## 2018-06-18 NOTE — Patient Instructions (Addendum)
Your chest x-ray looks completely clear.  Stay well hydrated and rest.  If your symptoms worsen in 5-7 days, start taking Doxycycline.  Come back if you get worse.   Start taking prednisone for your sciatica.    Azithromycin is an antibiotic. Take the entire course of this medication, even if you start to feel better sooner.  Tessalon is for cough during the day. This should not make you drowsy.  Hycodan is to help your cough at night. This will make you drowsy. Do not take this with any other medications that make your drowsy.  Mucinex will help thin out your mucus to help you clear it out. Drink plenty of water while taking this medication.   Stay well hydrated. Get lost of rest. Wash your hands often.   -Foods that can help speed recovery: honey, garlic, chicken soup, elderberries, green tea.  -Supplements that can help speed recovery: vitamin C, zinc, elderberry extract, quercetin, ginseng, selenium -Supplement with prebiotics and probiotics:   Advil or ibuprofen for pain. Do not take Aspirin.  Drink enough water and fluids to keep your urine clear or pale yellow.  For sore throat: ? Gargle with 8 oz of salt water ( tsp of salt per 1 qt of water) as often as every 1-2 hours to soothe your throat.  Gargle liquid benadryl.  Cepacol throat lozenges (if you are not at risk for choking).  For sore throat try using a honey-based tea. Use 3 teaspoons of honey with juice squeezed from half lemon. Place shaved pieces of ginger into 1/2-1 cup of water and warm over stove top. Then mix the ingredients and repeat every 4 hours as needed.  Cough Syrup Recipe: Sweet Lemon & Honey Thyme  Ingredients . a handful of fresh thyme sprigs   . 1 pint of water (2 cups)  . 1/2 cup honey (raw is best, but regular will do)  . 1/2 lemon chopped Instructions 1. Place the lemon in the pint jar and cover with the honey. The honey will macerate the lemons and draw out liquids which taste so  delicious! 2. Meanwhile, toss the thyme leaves into a saucepan and cover them with the water. 3. Bring the water to a gentle simmer and reduce it to half, about a cup of tea. 4. When the tea is reduced and cooled a bit, strain the sprigs & leaves, add it into the pint jar and stir it well. 5. Give it a shake and use a spoonful as needed. 6. Store your homemade cough syrup in the refrigerator for about a month.  What causes a cough?  In adults, common causes of a cough include: ?An infection of the airways or lungs (such as the common cold) ?Postnasal drip - Postnasal drip is when mucus from the nose drips down or flows along the back of the throat. Postnasal drip can happen when people have: .A cold .Allergies .A sinus infection - The sinuses are hollow areas in the bones of the face that open into the nose. ?Lung conditions, like asthma and chronic obstructive pulmonary disease (COPD) - Both of these conditions can make it hard to breathe. COPD is usually caused by smoking. ?Acid reflux - Acid reflux is when the acid that is normally in your stomach backs up into your esophagus (the tube that carries food from your mouth to your stomach). ?A side effect from blood pressure medicines called "ACE inhibitors" ?Smoking cigarettes  Is there anything I can do on my own  to get rid of my cough?  Yes. To help get rid of your cough, you can: ?Use a humidifier in your bedroom ?Use an over-the-counter cough medicine, or suck on cough drops or hard candy ?Stop smoking, if you smoke ?If you have allergies, avoid the things you are allergic to (like pollen, dust, animals, or mold) If you have acid reflux, your doctor or nurse will tell you which lifestyle changes can help reduce symptoms.    If you have lab work done today you will be contacted with your lab results within the next 2 weeks.  If you have not heard from [place] then please contact us. The fastest way to get your results is to register for My  Chart.   IF you received an x-ray today, you will receive an invoice from [facility]. Please contact [facility] at [phone] with questions or concerns regarding your invoice.   IF you received labwork today, you will receive an invoice from [facility]. Please contact [facility] at [phone] with questions or concerns regarding your invoice.   Our billing staff will not be able to assist you with questions regarding bills from these companies.  You will be contacted with the lab results as soon as they are available. The fastest way to get your results is to activate your My Chart account. Instructions are located on the last page of this paperwork. If you have not heard from [place] regarding the results in 2 weeks, please contact this office.

## 2018-06-18 NOTE — Progress Notes (Signed)
Angelica Sharp  MRN: [id number] DOB: 02-03-1966  PCP: Patient, No Pcp Per  Subjective:  Pt is a 53 year old female who presents to clinic for not feeling well.  Endorses nasal congestion and sore throat x 3 days.  Endorses chest tightness and fatigue.   She was treated for possible pneumonia 11/22 with Azithromycin. She had lower lobe PNU in June 2019.   Endorses night sweats.  Pt  has a past medical history of Allergy, Anemia, Anxiety, Arthritis, Borderline hypertension, Chest pain, Degenerative lumbar disc, Diabetes mellitus without complication ([place]), Diverticulitis, Edema, Family history of anesthesia complication, GERD (gastroesophageal reflux disease), H/O hiatal hernia, Hypertension, and SOB (shortness of breath).  Review of Systems  Constitutional: Negative for chills, diaphoresis, fatigue and fever.  HENT: Positive for congestion and sore throat. Negative for postnasal drip, rhinorrhea, sinus pressure and sinus pain.   Respiratory: Positive for chest tightness. Negative for cough, shortness of breath and wheezing.   Psychiatric/Behavioral: Negative for sleep disturbance.   Patient Active Problem List   Diagnosis Date Noted  . Essential hypertension 11/25/2017  . Foot pain 11/23/2017  . Chronic joint pain 03/29/2015  . DM II (diabetes mellitus, type II), controlled ([place]) 10/01/2014  . Obesity 03/12/2014  . 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 Hyperlipidemia 09/10/2013  . Family history of cancer 08/30/2012  . GERD (gastroesophageal reflux disease) 07/03/2011    Current Outpatient Medications on File Prior to Visit  Medication Sig Dispense Refill  . ALPRAZolam (XANAX) 0.25 MG tablet Take 0.5-1 tablets (0.125-0.25 mg total) by mouth at bedtime as needed for anxiety. 20 tablet 0  . aspirin EC 81 MG tablet Take 81 mg by mouth daily.    . Cholecalciferol (VITAMIN D) 2000 units tablet Take 2,000 Units by mouth daily.    . diclofenac (VOLTAREN) 75 MG EC tablet Take 1  tablet (75 mg total) by mouth 2 (two) times daily. 30 tablet 0  . diclofenac (VOLTAREN) 75 MG EC tablet Take 1 tablet (75 mg total) by mouth 2 (two) times daily. Appointment needed for more refills. 76 tablet 0  . esomeprazole (NEXIUM) 40 MG capsule Take 1 capsule (40 mg total) by mouth daily. 90 capsule 1  . glucose blood test strip Use as instructed 100 each 3  . hydrochlorothiazide (HYDRODIURIL) 25 MG tablet TAKE 1 TABLET BY MOUTH ONCE DAILY 90 tablet 3  . metFORMIN (GLUCOPHAGE-XR) 750 MG 24 hr tablet TAKE ONE TABLET BY MOUTH TWICE DAILY WITH FOOD    . naproxen sodium (ALEVE) 220 MG tablet Take 440 mg by mouth daily as needed.    . ondansetron (ZOFRAN ODT) 4 MG disintegrating tablet Take 1 tablet (4 mg total) by mouth every 8 (eight) hours as needed for nausea. 10 tablet 0  . polyethylene glycol (MIRALAX / GLYCOLAX) packet Take 17 g by mouth daily as needed for mild constipation.     . Potassium (POTASSIMIN PO) Take by mouth.    . pravastatin (PRAVACHOL) 40 MG tablet TAKE 1 TABLET BY MOUTH ONCE DAILY 90 tablet 3  . vitamin B-12 (CYANOCOBALAMIN) 500 MCG tablet Take 500 mcg by mouth daily.    . Dulaglutide (TRULICITY) 9.79 GX/2.1JH SOPN Inject 0.75 mg into the skin every Sunday.    . [DISCONTINUED] calcium carbonate 200 MG capsule TWICE WEEKLY    . [DISCONTINUED] ranitidine (ZANTAC) 150 MG capsule Take 150 mg by mouth every evening.     No current facility-administered medications on file prior to visit.  Allergies  Allergen Reactions  . Morphine And Related     Headache and hallucinations  . Septra [Sulfamethoxazole-Trimethoprim]     Increased heartrate     Objective:  BP 134/84   Pulse (!) 108   Temp 98.5 F (36.9 C) (Oral)   Resp 16   Ht 5\' 7"  (1.702 m)   Wt 207 lb 9.6 oz (94.2 kg)   LMP 08/10/2012   SpO2 96%   BMI 32.51 kg/m   Physical Exam Vitals signs reviewed.  Constitutional:      General: She is not in acute distress. HENT:     Right Ear: Tympanic membrane  normal.     Left Ear: Tympanic membrane normal.     Nose: Mucosal edema present. No rhinorrhea.     Right Sinus: No maxillary sinus tenderness or frontal sinus tenderness.     Left Sinus: No maxillary sinus tenderness or frontal sinus tenderness.  Cardiovascular:     Rate and Rhythm: Normal rate and regular rhythm.     Heart sounds: Normal heart sounds.  Pulmonary:     Effort: Pulmonary effort is normal. No respiratory distress.     Breath sounds: Normal breath sounds. No wheezing, rhonchi or rales.  Skin:    General: Skin is warm and dry.  Neurological:     Mental Status: She is alert and oriented to person, place, and time.  Psychiatric:        Judgment: Judgment normal.     Dg Chest 2 View  Result Date: 06/18/2018 CLINICAL DATA:  Cough and chills EXAM: CHEST - 2 VIEW COMPARISON:  May 02, 2018 FINDINGS: There is no evident edema or consolidation. The heart size and pulmonary vascularity are normal. No adenopathy. No bone lesions. IMPRESSION: No edema or consolidation. Electronically Signed   By: Lowella Grip III M.D.   On: 06/18/2018 10:42    Assessment and Plan :  1. Cough - Pt presents c/o cough x 3 days. She has been treated for PNU twice in the past 6 months. Negative chest x-ray. PE is unremarkable. Vitals are stable.  Low suspicion for PNU. RTC in 5-7 days if no improvement  - HYDROcodone-homatropine (HYCODAN) 5-1.5 MG/5ML syrup; Take 5 mLs by mouth every 8 (eight) hours as needed for cough.  Dispense: 120 mL; Refill: 0 - benzonatate (TESSALON) 200 MG capsule; Take 1 capsule (200 mg total) by mouth 3 (three) times daily as needed for cough.  Dispense: 30 capsule; Refill: 1 - DG Chest 2 View; Future - doxycycline (VIBRA-TABS) 100 MG tablet; Take 1 tablet (100 mg total) by mouth 2 (two) times daily.  Dispense: 20 tablet; Refill: 0 - predniSONE (DELTASONE) 20 MG tablet; Take 3 PO QAM x3days, 2 PO QAM x3days, 1 PO QAM x3days  Dispense: 18 tablet; Refill: 0    [name], PA-C  [facility] 06/18/2018 10:09 AM  Please note: Portions of this report may have been transcribed using dragon voice recognition software. Every effort was made to ensure accuracy; however, inadvertent computerized transcription errors may be present.

## 2018-06-23 ENCOUNTER — Ambulatory Visit: Payer: Self-pay | Admitting: Family Medicine

## 2018-06-30 ENCOUNTER — Emergency Department (HOSPITAL_BASED_OUTPATIENT_CLINIC_OR_DEPARTMENT_OTHER)
Admission: EM | Admit: 2018-06-30 | Discharge: 2018-07-01 | Disposition: A | Discharge status: Home / Self Care | Payer: BLUE CROSS/BLUE SHIELD | Attending: Emergency Medicine | Admitting: Emergency Medicine

## 2018-06-30 ENCOUNTER — Emergency Department (HOSPITAL_BASED_OUTPATIENT_CLINIC_OR_DEPARTMENT_OTHER): Payer: BLUE CROSS/BLUE SHIELD

## 2018-06-30 ENCOUNTER — Encounter (HOSPITAL_BASED_OUTPATIENT_CLINIC_OR_DEPARTMENT_OTHER): Payer: Self-pay | Admitting: *Deleted

## 2018-06-30 ENCOUNTER — Other Ambulatory Visit: Payer: Self-pay

## 2018-06-30 DIAGNOSIS — Z7982 Long term (current) use of aspirin: Secondary | ICD-10-CM | POA: Insufficient documentation

## 2018-06-30 DIAGNOSIS — E119 Type 2 diabetes mellitus without complications: Secondary | ICD-10-CM | POA: Diagnosis not present

## 2018-06-30 DIAGNOSIS — M79662 Pain in left lower leg: Secondary | ICD-10-CM | POA: Insufficient documentation

## 2018-06-30 DIAGNOSIS — M25552 Pain in left hip: Secondary | ICD-10-CM | POA: Insufficient documentation

## 2018-06-30 DIAGNOSIS — Z79899 Other long term (current) drug therapy: Secondary | ICD-10-CM | POA: Diagnosis not present

## 2018-06-30 DIAGNOSIS — I10 Essential (primary) hypertension: Secondary | ICD-10-CM | POA: Insufficient documentation

## 2018-06-30 LAB — URINALYSIS, ROUTINE W REFLEX MICROSCOPIC
Bilirubin Urine: NEGATIVE
GLUCOSE, UA: NEGATIVE mg/dL
HGB URINE DIPSTICK: NEGATIVE
Ketones, ur: NEGATIVE mg/dL
Leukocytes, UA: NEGATIVE
Nitrite: NEGATIVE
PROTEIN: NEGATIVE mg/dL
Specific Gravity, Urine: 1.01 (ref 1.005–1.030)
pH: 7 (ref 5.0–8.0)

## 2018-06-30 MED ORDER — METHOCARBAMOL 500 MG PO TABS: 500.0000 mg | ORAL_TABLET | Freq: Two times a day (BID) | ORAL | 0 refills | Status: DC

## 2018-06-30 MED ORDER — OXYCODONE-ACETAMINOPHEN 5-325 MG PO TABS
1.0000 | ORAL_TABLET | Freq: Once | ORAL | Status: AC
  Administered 2018-06-30: 1 via ORAL
  Filled 2018-06-30: qty 1

## 2018-06-30 MED ORDER — TRAMADOL HCL 50 MG PO TABS: 50.0000 mg | ORAL_TABLET | Freq: Four times a day (QID) | ORAL | 0 refills | Status: DC | PRN

## 2018-06-30 NOTE — Discharge Instructions (Addendum)
Your follow-up appointment with your primary care provider tomorrow.  Take Robaxin as needed for muscle spasms and pain.  Turn to the ED immediately for new or worsening symptoms, weakness, numbness, tingling or any concerns.

## 2018-06-30 NOTE — ED Notes (Signed)
Pt ambulated to bathroom 

## 2018-06-30 NOTE — ED Triage Notes (Signed)
Pt c/o left hip pain which radiates to groin and down leg , HX of same  , complrted pred pack w/o improvement

## 2018-07-01 ENCOUNTER — Encounter: Payer: Self-pay | Admitting: Family Medicine

## 2018-07-01 ENCOUNTER — Ambulatory Visit: Payer: BLUE CROSS/BLUE SHIELD | Admitting: Family Medicine

## 2018-07-01 VITALS — BP 140/80 | HR 111 | Temp 98.0°F | Resp 17 | Ht 67.0 in | Wt 205.0 lb

## 2018-07-01 DIAGNOSIS — M25552 Pain in left hip: Secondary | ICD-10-CM

## 2018-07-01 NOTE — Patient Instructions (Addendum)
     If you have lab work done today you will be contacted with your lab results within the next 2 weeks.  If you have not heard from [place] then please contact us. The fastest way to get your results is to register for My Chart.   IF you received an x-ray today, you will receive an invoice from [facility]. Please contact [facility] at [phone] with questions or concerns regarding your invoice.   IF you received labwork today, you will receive an invoice from [place]. Please contact [facility] at [phone] with questions or concerns regarding your invoice.   Our billing staff will not be able to assist you with questions regarding bills from these companies.  You will be contacted with the lab results as soon as they are available. The fastest way to get your results is to activate your My Chart account. Instructions are located on the last page of this paperwork. If you have not heard from [place] regarding the results in 2 weeks, please contact this office.      Hip Pain  The hip is the joint between the upper legs and the lower pelvis. The bones, cartilage, tendons, and muscles of your hip joint support your body and allow you to move around. Hip pain can range from a minor ache to severe pain in one or both of your hips. The pain may be felt on the inside of the hip joint near the groin, or the outside near the buttocks and upper thigh. You may also have swelling or stiffness. Follow these instructions at home: Managing pain, stiffness, and swelling  If directed, apply ice to the injured area. ? Put ice in a plastic bag. ? Place a towel between your skin and the bag. ? Leave the ice on for 20 minutes, 2-3 times a day  Sleep with a pillow between your legs on your most comfortable side.  Avoid any activities that cause pain. General instructions  Take over-the-counter and prescription medicines only as told by your health care provider.  Do any exercises as  told by your health care provider.  Record the following: ? How often you have hip pain. ? The location of your pain. ? What the pain feels like. ? What makes the pain worse.  Keep all follow-up visits as told by your health care provider. This is important. Contact a health care provider if:  You cannot put weight on your leg.  Your pain or swelling continues or gets worse after one week.  It gets harder to walk.  You have a fever. Get help right away if:  You fall.  You have a sudden increase in pain and swelling in your hip.  Your hip is red or swollen or very tender to touch. Summary  Hip pain can range from a minor ache to severe pain in one or both of your hips.  The pain may be felt on the inside of the hip joint near the groin, or the outside near the buttocks and upper thigh.  Avoid any activities that cause pain.  Record how often you have hip pain, the location of the pain, what makes it worse and what it feels like. This information is not intended to replace advice given to you by your health care provider. Make sure you discuss any questions you have with your health care provider. Document Released: 11/15/2009 Document Revised: 04/30/2016 Document Reviewed: 04/30/2016 Elsevier Interactive Patient Education  2019 [employer].

## 2018-07-01 NOTE — Progress Notes (Signed)
Established Patient Office Visit  Subjective:  Patient ID: Angelica Sharp, female    DOB: 29-Sep-1965  Age: 53 y.o. MRN: [id number]  CC:  Chief Complaint  Patient presents with  . Groin Pain    HPI [name] presents for follow up for left groin pain that feels like it is there all the time When she ambulates she feels like there is a catch in the groin area that feels like a stab She denies overall weakness  Her pain is 3/10 She states that she was treated for this in the office here with Prednisone, she also went to the ER on 06/30/17 Her workup was negative for UTI, DVT and she had a normal hip xray  Past Medical History:  Diagnosis Date  . Allergy   . Anemia   . Anxiety   . Arthritis   . Borderline hypertension   . Chest pain   . Degenerative lumbar disc    L5S1  . Diabetes mellitus without complication ([place])   . Diverticulitis   . Edema    lower extremity  . Family history of anesthesia complication    sister had BP complications during anes.  Marland Kitchen GERD (gastroesophageal reflux disease)   . H/O hiatal hernia   . Hypertension   . SOB (shortness of breath)     Past Surgical History:  Procedure Laterality Date  . ABDOMINAL HYSTERECTOMY    . BREAST LUMPECTOMY     3 lumps -- two from right -- one from left  . CESAREAN SECTION    . LAPAROSCOPIC ASSISTED VAGINAL HYSTERECTOMY N/A 09/18/2012   Procedure: LAPAROSCOPIC ASSISTED VAGINAL HYSTERECTOMY;  Surgeon: Linda Hedges, DO;  Location: [facility];  Service: Gynecology;  Laterality: N/A;  BLADDER  DISTENTION  . TUBAL LIGATION      Family History  Problem Relation Age of Onset  . Heart attack Mother   . Diabetes Mother   . Hyperlipidemia Mother   . Hypertension Mother   . Heart attack Sister   . Pulmonary embolism Father   . Clotting disorder Father   . Diabetes Sister   . Kidney disease Sister        kidney failure  . Heart disease Sister   . Cancer Sister   . Diabetes Son 9       TYPE 1    Social History    Socioeconomic History  . Marital status: Divorced    Spouse name: Not on file  . Number of children: 3  . Years of education: Not on file  . Highest education level: Not on file  Occupational History  . 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 Financial resource strain: Not on file  . Food insecurity:    Worry: Not on file    Inability: Not on file  . Transportation needs:    Medical: Not on file    Non-medical: Not on file  Tobacco Use  . Smoking status: Never Smoker  . Smokeless tobacco: Never Used  Substance and Sexual Activity  . Alcohol use: No  . Drug use: No  . Sexual activity: Not Currently  Lifestyle  . Physical activity:    Days per week: Not on file    Minutes per session: Not on file  . Stress: Not on file  Relationships  . Social connections:    Talks on phone: Not on file  Gets together: Not on file    Attends religious service: Not on file    Active member of club or organization: Not on file    Attends meetings of clubs or organizations: Not on file    Relationship status: Not on file  . Intimate partner violence:    Fear of current or ex partner: Not on file    Emotionally abused: Not on file    Physically abused: Not on file    Forced sexual activity: Not on file  Other Topics Concern  . Not on file  Social History Narrative   Lives with daughter and grandkids.    Outpatient Medications Prior to Visit  Medication Sig Dispense Refill  . ALPRAZolam (XANAX) 0.25 MG tablet Take 0.5-1 tablets (0.125-0.25 mg total) by mouth at bedtime as needed for anxiety. 20 tablet 0  . aspirin EC 81 MG tablet Take 81 mg by mouth daily.    . benzonatate (TESSALON) 200 MG capsule Take 1 capsule (200 mg total) by mouth 3 (three) times daily as needed for cough. 30 capsule 1  . Cholecalciferol (VITAMIN D) 2000 units tablet Take 2,000 Units by mouth daily.    . diclofenac  (VOLTAREN) 75 MG EC tablet Take 1 tablet (75 mg total) by mouth 2 (two) times daily. 30 tablet 0  . diclofenac (VOLTAREN) 75 MG EC tablet Take 1 tablet (75 mg total) by mouth 2 (two) times daily. Appointment needed for more refills. 76 tablet 0  . doxycycline (VIBRA-TABS) 100 MG tablet Take 1 tablet (100 mg total) by mouth 2 (two) times daily. 20 tablet 0  . Dulaglutide (TRULICITY) 9.32 IZ/1.2WP SOPN Inject 0.75 mg into the skin every Sunday.    . esomeprazole (NEXIUM) 40 MG capsule Take 1 capsule (40 mg total) by mouth daily. 90 capsule 1  . glucose blood test strip Use as instructed 100 each 3  . hydrochlorothiazide (HYDRODIURIL) 25 MG tablet TAKE 1 TABLET BY MOUTH ONCE DAILY 90 tablet 3  . HYDROcodone-homatropine (HYCODAN) 5-1.5 MG/5ML syrup Take 5 mLs by mouth every 8 (eight) hours as needed for cough. 120 mL 0  . metFORMIN (GLUCOPHAGE-XR) 750 MG 24 hr tablet TAKE ONE TABLET BY MOUTH TWICE DAILY WITH FOOD    . methocarbamol (ROBAXIN) 500 MG tablet Take 1 tablet (500 mg total) by mouth 2 (two) times daily. 14 tablet 0  . naproxen sodium (ALEVE) 220 MG tablet Take 440 mg by mouth daily as needed.    . ondansetron (ZOFRAN ODT) 4 MG disintegrating tablet Take 1 tablet (4 mg total) by mouth every 8 (eight) hours as needed for nausea. 10 tablet 0  . polyethylene glycol (MIRALAX / GLYCOLAX) packet Take 17 g by mouth daily as needed for mild constipation.     . Potassium (POTASSIMIN PO) Take by mouth.    . pravastatin (PRAVACHOL) 40 MG tablet TAKE 1 TABLET BY MOUTH ONCE DAILY 90 tablet 3  . predniSONE (DELTASONE) 20 MG tablet Take 3 PO QAM x3days, 2 PO QAM x3days, 1 PO QAM x3days 18 tablet 0  . traMADol (ULTRAM) 50 MG tablet Take 1 tablet (50 mg total) by mouth every 6 (six) hours as needed for moderate pain. 15 tablet 0  . vitamin B-12 (CYANOCOBALAMIN) 500 MCG tablet Take 500 mcg by mouth daily.     No facility-administered medications prior to visit.     Allergies  Allergen Reactions  .  Morphine And Related     Headache and hallucinations  . Septra [Sulfamethoxazole-Trimethoprim]  Increased heartrate    ROS Review of Systems No fevers or chill No diarrhea or leakage of urine or stools No groin numbness No falling or fainting     Objective:    BP 140/80   Pulse (!) 111   Temp 98 F (36.7 C) (Oral)   Resp 17   Ht 5\' 7"  (1.702 m)   Wt 205 lb (93 kg)   LMP 08/10/2012 Comment: ovaries remain//a.c.  SpO2 98%   BMI 32.11 kg/m  Wt Readings from Last 3 Encounters:  07/01/18 205 lb (93 kg)  06/30/18 207 lb 3.7 oz (94 kg)  06/18/18 207 lb 9.6 oz (94.2 kg)    Physical Exam  Constitutional: She is oriented to person, place, and time. She appears well-developed and well-nourished.  HENT:  Head: Normocephalic and atraumatic.  Cardiovascular: Normal rate and regular rhythm.  Pulmonary/Chest: Effort normal.  Neurological: She is alert and oriented to person, place, and time.    SI joint non-tender Pain in the left inguinal area No palpable masses reflexes 2+ in the LE bilaterally Flexion maneuvers illicit pain and is very tender on the left but absent on the right Patient is limping on the left side   Health Maintenance Due  Topic Date Due  . PNEUMOCOCCAL POLYSACCHARIDE VACCINE AGE 41-64 HIGH RISK  05/24/1966  . OPHTHALMOLOGY EXAM  01/11/2017  . HEMOGLOBIN A1C  01/24/2017  . URINE MICROALBUMIN  07/27/2017  . FOOT EXAM  10/24/2017    There are no preventive care reminders to display for this patient.  Lab Results  Component Value Date   TSH 1.610 10/27/2016   Lab Results  Component Value Date   WBC 5.9 05/02/2018   HGB 11.2 05/02/2018   HCT 34.8 05/02/2018   MCV 74.0 (A) 05/02/2018   PLT 313 03/08/2017   Lab Results  Component Value Date   NA 138 03/08/2017   K 3.9 03/08/2017   CO2 26 03/08/2017   GLUCOSE 101 (H) 03/08/2017   BUN 18 03/08/2017   CREATININE 1.12 (H) 03/08/2017   BILITOT 0.4 03/08/2017   ALKPHOS 80 03/08/2017   AST 37  03/08/2017   ALT 36 03/08/2017   PROT 7.4 03/08/2017   ALBUMIN 4.1 03/08/2017   CALCIUM 9.3 03/08/2017   ANIONGAP 10 03/08/2017   Lab Results  Component Value Date   CHOL 142 10/27/2016   Lab Results  Component Value Date   HDL 40 10/27/2016   Lab Results  Component Value Date   LDLCALC 72 10/27/2016   Lab Results  Component Value Date   TRIG 151 (H) 10/27/2016   Lab Results  Component Value Date   CHOLHDL 3.6 10/27/2016   Lab Results  Component Value Date   HGBA1C 6.5 03/29/2015      Assessment & Plan:   Problem List Items Addressed This Visit    None    Visit Diagnoses    Acute hip pain, left    -  Primary   Relevant Orders   Ambulatory referral to Orthopedic Surgery      Concern for possible labral tear Will refer to Orthopedics for evaluation Discussed warning signs for nerve injury Continue NSAID Voltaren start the methocarbamol prescribed by the ER  No orders of the defined types were placed in this encounter.   Follow-up: No follow-ups on file.    Forrest Moron, MD

## 2018-07-01 NOTE — ED Provider Notes (Signed)
[facility] EMERGENCY DEPARTMENT Provider Note   CSN: [id number] Arrival date & time: 06/30/18  1737     History   Chief Complaint Chief Complaint  Patient presents with  . Hip Pain    HPI Angelica Sharp is a 53 y.o. female.  HPI   53 year old female presents with a 2-week history of left hip pain.  She describes the pain as along the inguinal canal radiating down her right leg.  She states she has been seen and evaluated for this in urgent care and started on steroids.  She was on steroids for approximately 9 days which worsened her symptoms.  She feels like the pain is radiating to her calf and that she has stiffness in her left calf.  She denies any injury or trauma.  She states pain only with range of motion.  She denies any abdominal pain, numbness, tingling, bowel or bladder incontinence, back pain.  She denies any dysuria, urinary urgency, urinary frequency.  Past Medical History:  Diagnosis Date  . Allergy   . Anemia   . Anxiety   . Arthritis   . Borderline hypertension   . Chest pain   . Degenerative lumbar disc    L5S1  . Diabetes mellitus without complication ([place])   . Diverticulitis   . Edema    lower extremity  . Family history of anesthesia complication    sister had BP complications during anes.  Marland Kitchen GERD (gastroesophageal reflux disease)   . H/O hiatal hernia   . Hypertension   . SOB (shortness of breath)     Patient Active Problem List   Diagnosis Date Noted  . Essential hypertension 11/25/2017  . Foot pain 11/23/2017  . Chronic joint pain 03/29/2015  . DM II (diabetes mellitus, type II), controlled ([place]) 10/01/2014  . Obesity 03/12/2014  . 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 Hyperlipidemia 09/10/2013  . Family history of cancer 08/30/2012  . GERD (gastroesophageal reflux disease) 07/03/2011    Past Surgical History:  Procedure Laterality Date  . ABDOMINAL HYSTERECTOMY    . BREAST LUMPECTOMY     3 lumps -- two from right --  one from left  . CESAREAN SECTION    . LAPAROSCOPIC ASSISTED VAGINAL HYSTERECTOMY N/A 09/18/2012   Procedure: LAPAROSCOPIC ASSISTED VAGINAL HYSTERECTOMY;  Surgeon: Linda Hedges, DO;  Location: [facility];  Service: Gynecology;  Laterality: N/A;  BLADDER  DISTENTION  . TUBAL LIGATION       OB History   No obstetric history on file.      Home Medications    Prior to Admission medications   Medication Sig Start Date End Date Taking? Authorizing Provider  ALPRAZolam (XANAX) 0.25 MG tablet Take 0.5-1 tablets (0.125-0.25 mg total) by mouth at bedtime as needed for anxiety. 05/02/18   Wendie Agreste, MD  aspirin EC 81 MG tablet Take 81 mg by mouth daily.    [provider]  benzonatate (TESSALON) 200 MG capsule Take 1 capsule (200 mg total) by mouth 3 (three) times daily as needed for cough. 06/18/18   McVey, Gelene Mink, PA-C  Cholecalciferol (VITAMIN D) 2000 units tablet Take 2,000 Units by mouth daily.    [provider]  diclofenac (VOLTAREN) 75 MG EC tablet Take 1 tablet (75 mg total) by mouth 2 (two) times daily. 03/25/18   Tenna Delaine D, PA-C  diclofenac (VOLTAREN) 75 MG EC tablet Take 1 tablet (75 mg total) by mouth 2 (two) times daily. Appointment needed for more refills.  05/15/18   Forrest Moron, MD  doxycycline (VIBRA-TABS) 100 MG tablet Take 1 tablet (100 mg total) by mouth 2 (two) times daily. 06/18/18   McVey, Gelene Mink, PA-C  Dulaglutide (TRULICITY) 5.63 SL/3.7DS SOPN Inject 0.75 mg into the skin every Sunday.    [provider]  esomeprazole (NEXIUM) 40 MG capsule Take 1 capsule (40 mg total) by mouth daily. 03/20/18   Tenna Delaine D, PA-C  glucose blood test strip Use as instructed 05/10/15   Harrison Mons, PA  hydrochlorothiazide (HYDRODIURIL) 25 MG tablet TAKE 1 TABLET BY MOUTH ONCE DAILY 01/27/18   Tenna Delaine D, PA-C  HYDROcodone-homatropine [facility]) 5-1.5 MG/5ML syrup Take 5 mLs by mouth every 8 (eight) hours as needed  for cough. 06/18/18   McVey, Gelene Mink, PA-C  metFORMIN (GLUCOPHAGE-XR) 750 MG 24 hr tablet TAKE ONE TABLET BY MOUTH TWICE DAILY WITH FOOD 12/17/16   [provider]  methocarbamol (ROBAXIN) 500 MG tablet Take 1 tablet (500 mg total) by mouth 2 (two) times daily. 06/30/18   Minha Fulco S, PA-C  naproxen sodium (ALEVE) 220 MG tablet Take 440 mg by mouth daily as needed.    [provider]  ondansetron (ZOFRAN ODT) 4 MG disintegrating tablet Take 1 tablet (4 mg total) by mouth every 8 (eight) hours as needed for nausea. 03/08/17   Larene Pickett, PA-C  polyethylene glycol [facility] / Floria Raveling) packet Take 17 g by mouth daily as needed for mild constipation.     [provider]  Potassium (POTASSIMIN PO) Take by mouth.    [provider]  pravastatin (PRAVACHOL) 40 MG tablet TAKE 1 TABLET BY MOUTH ONCE DAILY 12/24/17   Tenna Delaine D, PA-C  predniSONE (DELTASONE) 20 MG tablet Take 3 PO QAM x3days, 2 PO QAM x3days, 1 PO QAM x3days 06/18/18   McVey, Gelene Mink, PA-C  traMADol (ULTRAM) 50 MG tablet Take 1 tablet (50 mg total) by mouth every 6 (six) hours as needed for moderate pain. 06/30/18   Kaysin Brock S, PA-C  vitamin B-12 (CYANOCOBALAMIN) 500 MCG tablet Take 500 mcg by mouth daily.    [provider]  calcium carbonate 200 MG capsule TWICE WEEKLY  08/29/11  [provider]  ranitidine (ZANTAC) 150 MG capsule Take 150 mg by mouth every evening.  08/29/11  [provider]    Family History Family History  Problem Relation Age of Onset  . Heart attack Mother   . Diabetes Mother   . Hyperlipidemia Mother   . Hypertension Mother   . Heart attack Sister   . Pulmonary embolism Father   . Clotting disorder Father   . Diabetes Sister   . Kidney disease Sister        kidney failure  . Heart disease Sister   . Cancer Sister   . Diabetes Son 65       TYPE 1    Social History Social History   Tobacco Use  .  Smoking status: Never Smoker  . Smokeless tobacco: Never Used  Substance Use Topics  . Alcohol use: No  . Drug use: No     Allergies   Morphine and related and Septra [sulfamethoxazole-trimethoprim]   Review of Systems Review of Systems  Constitutional: Negative for chills and fever.  HENT: Negative for rhinorrhea.   Respiratory: Negative for shortness of breath.   Cardiovascular: Negative for chest pain.  Gastrointestinal: Negative for abdominal pain, nausea and vomiting.  Genitourinary: Negative for dysuria and frequency.  Musculoskeletal:  Positive for myalgias (left hip pain).     Physical Exam Updated Vital Signs BP 112/75 (BP Location: Right Arm)   Pulse 76   Temp 98.2 F (36.8 C) (Oral)   Resp 16   Ht 5\' 7"  (1.702 m)   Wt 94 kg   LMP 08/10/2012 Comment: ovaries remain//a.c.  SpO2 100%   BMI 32.46 kg/m   Physical Exam Vitals signs and nursing note reviewed.  Constitutional:      Appearance: She is well-developed.  HENT:     Head: Normocephalic and atraumatic.  Eyes:     Conjunctiva/sclera: Conjunctivae normal.  Neck:     Musculoskeletal: Neck supple.  Cardiovascular:     Rate and Rhythm: Normal rate and regular rhythm.     Heart sounds: Normal heart sounds. No murmur.  Pulmonary:     Effort: Pulmonary effort is normal. No respiratory distress.     Breath sounds: Normal breath sounds. No wheezing or rales.  Abdominal:     General: Bowel sounds are normal. There is no distension.     Palpations: Abdomen is soft.     Tenderness: There is no abdominal tenderness.  Musculoskeletal: Normal range of motion.        General: No tenderness or deformity.     Left hip: She exhibits normal range of motion and normal strength.       Legs:  Skin:    General: Skin is warm and dry.     Findings: No erythema or rash.  Neurological:     Mental Status: She is alert and oriented to person, place, and time.  Psychiatric:        Behavior: Behavior normal.       ED Treatments / Results  Labs (all labs ordered are listed, but only abnormal results are displayed) Labs Reviewed  URINALYSIS, ROUTINE W REFLEX MICROSCOPIC    EKG None  Radiology US Venous Img Lower  Left (dvt Study)  Result Date: 06/30/2018 CLINICAL DATA:  Left calf pain for 2 weeks EXAM: Left LOWER EXTREMITY VENOUS DOPPLER ULTRASOUND TECHNIQUE: Gray-scale sonography with graded compression, as well as color Doppler and duplex ultrasound were performed to evaluate the lower extremity deep venous systems from the level of the common femoral vein and including the common femoral, femoral, profunda femoral, popliteal and calf veins including the posterior tibial, peroneal and gastrocnemius veins when visible. The superficial great saphenous vein was also interrogated. Spectral Doppler was utilized to evaluate flow at rest and with distal augmentation maneuvers in the common femoral, femoral and popliteal veins. COMPARISON:  None. FINDINGS: Contralateral Common Femoral Vein: Respiratory phasicity is normal and symmetric with the symptomatic side. No evidence of thrombus. Normal compressibility. Common Femoral Vein: No evidence of thrombus. Normal compressibility, respiratory phasicity and response to augmentation. Saphenofemoral Junction: No evidence of thrombus. Normal compressibility and flow on color Doppler imaging. Profunda Femoral Vein: No evidence of thrombus. Normal compressibility and flow on color Doppler imaging. Femoral Vein: No evidence of thrombus. Normal compressibility, respiratory phasicity and response to augmentation. Popliteal Vein: No evidence of thrombus. Normal compressibility, respiratory phasicity and response to augmentation. Calf Veins: No evidence of thrombus. Normal compressibility and flow on color Doppler imaging. IMPRESSION: No evidence of deep venous thrombosis. Electronically Signed   By: Donavan Foil M.D.   On: 06/30/2018 22:56   Dg Hip Unilat With Pelvis  2-3 Views Left  Result Date: 06/30/2018 CLINICAL DATA:  Hip pain for 2 days EXAM: DG HIP (WITH OR WITHOUT PELVIS) 3V  LEFT COMPARISON:  None. FINDINGS: Pelvic ring is intact. No acute fracture or dislocation is noted. No soft tissue abnormality is seen. IMPRESSION: No acute abnormality noted. Electronically Signed   By: Inez Catalina M.D.   On: 06/30/2018 21:56    Procedures Procedures (including critical care time)  Medications Ordered in ED Medications  oxyCODONE-acetaminophen (PERCOCET/ROXICET) 5-325 MG per tablet 1 tablet (1 tablet Oral Given 06/30/18 2004)     Initial Impression / Assessment and Plan / ED Course  I have reviewed the triage vital signs and the nursing notes.  Pertinent labs & imaging results that were available during my care of the patient were reviewed by me and considered in my medical decision making (see chart for details).     Patient present with took history of left hip pain.  She states symptoms are worse after steroids.  She notes pain only with range of motion.  She denies any direct injury or trauma.  She has no abdominal tenderness, no nausea, no vomiting.  Hip hurts only with range of motion.  She is neurovascular intact distally.  She had an x-ray and a DVT study with no acute findings.  Clear etiology of patient's symptoms, do not see any evidence of abdominal etiology, hernias, septic arthritis.  Encourage close follow-up with primary care.  Patient resting comfortably in bed, no acute distress, nontoxic, non-lethargic.  Vital signs stable.  She is ready and stable for discharge.   At this time there does not appear to be any evidence of an acute emergency medical condition and the patient appears stable for discharge with appropriate outpatient follow up.Diagnosis was discussed with patient who verbalizes understanding and is agreeable to discharge.   Final Clinical Impressions(s) / ED Diagnoses   Final diagnoses:  Left hip pain    ED Discharge  Orders         Ordered    traMADol (ULTRAM) 50 MG tablet  Every 6 hours PRN     06/30/18 2318    methocarbamol (ROBAXIN) 500 MG tablet  2 times daily     06/30/18 2318           Etter Sjogren, [place] 07/15/18 1259    Hayden Rasmussen, MD 07/16/18 [phone]

## 2018-07-08 ENCOUNTER — Ambulatory Visit (INDEPENDENT_AMBULATORY_CARE_PROVIDER_SITE_OTHER): Payer: BLUE CROSS/BLUE SHIELD | Admitting: Orthopaedic Surgery

## 2018-07-08 ENCOUNTER — Encounter (INDEPENDENT_AMBULATORY_CARE_PROVIDER_SITE_OTHER): Payer: Self-pay | Admitting: Orthopaedic Surgery

## 2018-07-08 DIAGNOSIS — M25552 Pain in left hip: Secondary | ICD-10-CM | POA: Diagnosis not present

## 2018-07-08 MED ORDER — METHYLPREDNISOLONE ACETATE 40 MG/ML IJ SUSP: 40.0000 mg | Freq: Once | INTRAMUSCULAR | Status: DC

## 2018-07-08 NOTE — Progress Notes (Signed)
Subjective: She is here with left hip pain, for ultrasound-guided injection and possible aspiration.  Objective: She has pain with hip flexion and internal rotation.  Still has pretty good range of motion.  Procedure: Ultrasound-guided left hip injection: After sterile prep with Betadine, injected 8 cc 1% lidocaine without epinephrine and 40 mg methylprednisolone using a 22-gauge spinal needle.  The needle was passed through the iliofemoral ligament into the femoral head/neck junction through what appeared to be a joint effusion, but no fluid was aspirated.  The injectate was seen filling the joint capsule.  She did not have that much improvement during the immediate anesthetic phase.  She will follow-up as directed if no improvement.

## 2018-07-08 NOTE — Progress Notes (Signed)
Office Visit Note   Patient: Angelica Sharp           Date of Birth: [birth year]           MRN: [id number] Visit Date: 07/08/2018              Requested by: Forrest Moron, MD [place], [place] [address] PCP: Wendie Agreste, MD   Assessment & Plan: Visit Diagnoses:  1. Pain of left hip joint     Plan: Impression is left hip pain suspect labral tear versus OA exacerbation.  Overall x-rays do not show significant degenerative disease.  We arrange for her to receive a cortisone injection with Dr. Junius Roads today under ultrasound.  I like to reevaluate her in 4 weeks to see how she responds.  Questions encouraged and answered.  Follow-Up Instructions: Return in about 4 weeks (around 08/05/2018).   Orders:  No orders of the defined types were placed in this encounter.  Meds ordered this encounter  Medications  . methylPREDNISolone acetate (DEPO-MEDROL) injection 40 mg      Procedures: No procedures performed   Clinical Data: No additional findings.   Subjective: Chief Complaint  Patient presents with  . Left Hip - Pain    Angelica Sharp is a 53 year old female who has had left hip pain for 3 months of insidious onset.  She has primarily groin pain that radiates deep into the hip joint and down into the thigh.  She denies any back pain or radicular symptoms.  She did take a prednisone Dosepak which did not give her much relief.  She had an ultrasound that was negative for DVT.   Review of Systems  Constitutional: Negative.   HENT: Negative.   Eyes: Negative.   Respiratory: Negative.   Cardiovascular: Negative.   Endocrine: Negative.   Musculoskeletal: Negative.   Neurological: Negative.   Hematological: Negative.   Psychiatric/Behavioral: Negative.   All other systems reviewed and are negative.    Objective: Vital Signs: LMP 08/10/2012 Comment: ovaries remain//a.c.  Physical Exam Vitals signs and nursing note reviewed.  Constitutional:      Appearance:  She is well-developed.  HENT:     Head: Normocephalic and atraumatic.  Neck:     Musculoskeletal: Neck supple.  Pulmonary:     Effort: Pulmonary effort is normal.  Abdominal:     Palpations: Abdomen is soft.  Skin:    General: Skin is warm.     Capillary Refill: Capillary refill takes less than 2 seconds.  Neurological:     Mental Status: She is alert and oriented to person, place, and time.  Psychiatric:        Behavior: Behavior normal.        Thought Content: Thought content normal.        Judgment: Judgment normal.     Ortho Exam Left hip exam shows positive logroll, positive Stinchfield, positive FADIR.  Negative sciatic tension signs. Specialty Comments:  No specialty comments available.  Imaging: No results found.   PMFS History: Patient Active Problem List   Diagnosis Date Noted  . Essential hypertension 11/25/2017  . Foot pain 11/23/2017  . Chronic joint pain 03/29/2015  . DM II (diabetes mellitus, type II), controlled ([place]) 10/01/2014  . Obesity 03/12/2014  . 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 Hyperlipidemia 09/10/2013  . Family history of cancer 08/30/2012  . GERD (gastroesophageal reflux disease) 07/03/2011   Past Medical History:  Diagnosis Date  . Allergy   . Anemia   .  Anxiety   . Arthritis   . Borderline hypertension   . Chest pain   . Degenerative lumbar disc    L5S1  . Diabetes mellitus without complication ([place])   . Diverticulitis   . Edema    lower extremity  . Family history of anesthesia complication    sister had BP complications during anes.  Marland Kitchen GERD (gastroesophageal reflux disease)   . H/O hiatal hernia   . Hypertension   . SOB (shortness of breath)     Family History  Problem Relation Age of Onset  . Heart attack Mother   . Diabetes Mother   . Hyperlipidemia Mother   . Hypertension Mother   . Heart attack Sister   . Pulmonary embolism Father   . Clotting disorder Father   . Diabetes Sister   . Kidney disease  Sister        kidney failure  . Heart disease Sister   . Cancer Sister   . Diabetes Son 19       TYPE 1    Past Surgical History:  Procedure Laterality Date  . ABDOMINAL HYSTERECTOMY    . BREAST LUMPECTOMY     3 lumps -- two from right -- one from left  . CESAREAN SECTION    . LAPAROSCOPIC ASSISTED VAGINAL HYSTERECTOMY N/A 09/18/2012   Procedure: LAPAROSCOPIC ASSISTED VAGINAL HYSTERECTOMY;  Surgeon: Linda Hedges, DO;  Location: [facility];  Service: Gynecology;  Laterality: N/A;  BLADDER  DISTENTION  . TUBAL LIGATION     Social History   Occupational History  . 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 Smoking status: Never Smoker  . Smokeless tobacco: Never Used  Substance and Sexual Activity  . Alcohol use: No  . Drug use: No  . Sexual activity: Not Currently

## 2018-07-10 ENCOUNTER — Other Ambulatory Visit (INDEPENDENT_AMBULATORY_CARE_PROVIDER_SITE_OTHER): Payer: Self-pay

## 2018-07-10 ENCOUNTER — Telehealth (INDEPENDENT_AMBULATORY_CARE_PROVIDER_SITE_OTHER): Payer: Self-pay | Admitting: Orthopaedic Surgery

## 2018-07-10 MED ORDER — HYDROCODONE-ACETAMINOPHEN 5-325 MG PO TABS: 1.0000 | ORAL_TABLET | Freq: Four times a day (QID) | ORAL | 0 refills | Status: DC | PRN

## 2018-07-10 NOTE — Telephone Encounter (Signed)
LMOM for patient letting her know Rx at front desk

## 2018-07-10 NOTE — Telephone Encounter (Signed)
Patient called stating her hip is still hurting pretty bad. Patient said the pain was so bad this morning she was almost in tears. Patient asked if she can be set up for an MRI. The number to contact patient is [phone]

## 2018-07-10 NOTE — Telephone Encounter (Signed)
[place] #[id number]

## 2018-07-10 NOTE — Telephone Encounter (Signed)
Ok to schedule MRI or do you want to see her first?

## 2018-07-10 NOTE — Telephone Encounter (Signed)
I think it's probably too soon to get an MRI since she just had the injection and it's normal to get a post injection flare up.  I would recommend giving it a full week to see how it feels and if it doesn't feel better then we should get an MRI

## 2018-07-10 NOTE — Telephone Encounter (Signed)
Patient is asking if there is anything she can take for this pain. She states not Tramadol-it makes her really dizzy [facility]

## 2018-07-15 ENCOUNTER — Telehealth (INDEPENDENT_AMBULATORY_CARE_PROVIDER_SITE_OTHER): Payer: Self-pay | Admitting: Orthopaedic Surgery

## 2018-07-15 NOTE — Telephone Encounter (Signed)
Patient called would like to proceed with scheduling for her MRI. I didn't see an MRI order in her chart unable to provide patient with next steps. Please call patient to advise she is currently still in pain.

## 2018-07-16 NOTE — Telephone Encounter (Signed)
Patient called wanted to check on the call below

## 2018-07-17 ENCOUNTER — Other Ambulatory Visit (INDEPENDENT_AMBULATORY_CARE_PROVIDER_SITE_OTHER): Payer: Self-pay

## 2018-07-17 DIAGNOSIS — M25552 Pain in left hip: Secondary | ICD-10-CM

## 2018-07-17 NOTE — Telephone Encounter (Signed)
Ok to order MRI? 

## 2018-07-17 NOTE — Telephone Encounter (Signed)
yes

## 2018-07-17 NOTE — Telephone Encounter (Signed)
Sent order.

## 2018-07-25 ENCOUNTER — Ambulatory Visit
Admission: RE | Admit: 2018-07-25 | Discharge: 2018-07-25 | Disposition: A | Discharge status: Home / Self Care | Payer: BLUE CROSS/BLUE SHIELD | Source: Ambulatory Visit | Attending: Orthopaedic Surgery | Admitting: Orthopaedic Surgery

## 2018-07-25 DIAGNOSIS — M1612 Unilateral primary osteoarthritis, left hip: Secondary | ICD-10-CM | POA: Diagnosis not present

## 2018-07-25 DIAGNOSIS — M25552 Pain in left hip: Secondary | ICD-10-CM

## 2018-07-31 ENCOUNTER — Ambulatory Visit (INDEPENDENT_AMBULATORY_CARE_PROVIDER_SITE_OTHER): Payer: BLUE CROSS/BLUE SHIELD | Admitting: Orthopaedic Surgery

## 2018-07-31 ENCOUNTER — Encounter (INDEPENDENT_AMBULATORY_CARE_PROVIDER_SITE_OTHER): Payer: Self-pay | Admitting: Orthopaedic Surgery

## 2018-07-31 DIAGNOSIS — M25562 Pain in left knee: Secondary | ICD-10-CM

## 2018-07-31 DIAGNOSIS — M1611 Unilateral primary osteoarthritis, right hip: Secondary | ICD-10-CM | POA: Diagnosis not present

## 2018-07-31 DIAGNOSIS — M1612 Unilateral primary osteoarthritis, left hip: Secondary | ICD-10-CM

## 2018-07-31 DIAGNOSIS — G8929 Other chronic pain: Secondary | ICD-10-CM | POA: Diagnosis not present

## 2018-07-31 NOTE — Progress Notes (Addendum)
Office Visit Note   Patient: Angelica Sharp           Date of Birth: 1965-11-12           MRN: [id number] Visit Date: 07/31/2018              Requested by: Wendie Agreste, MD [address] [place], [place] [address] PCP: Wendie Agreste, MD   Assessment & Plan: Visit Diagnoses:  1. Primary osteoarthritis of left hip   2. Chronic pain of left knee     Plan: We discussed that the left knee pain is likely referred pain from the hip although the patient would like to try cortisone injection to make sure.  We performed a left knee injection today to see how she responds to this.  The MRI shows that she has a severe degenerative labral tear with focal areas of full-thickness cartilage loss of the femoral head with subchondral edema.  We had a long discussion today regarding treatment options both surgical and nonsurgical.  She will let us know how she responds to the knee injection.  Questions were answered to her satisfaction.  Follow-up as needed.  Follow-Up Instructions: Return if symptoms worsen or fail to improve.   Orders:  No orders of the defined types were placed in this encounter.  No orders of the defined types were placed in this encounter.     Procedures: No procedures performed   Clinical Data: No additional findings.   Subjective: Chief Complaint  Patient presents with  . Left Hip - Pain    [name] follows up for her MRI review of her left hip.  She also endorses referred pain to the left knee.  She endorses pain in her left hip that ranges from 6-9 out of 10.   Review of Systems  Constitutional: Negative.   HENT: Negative.   Eyes: Negative.   Respiratory: Negative.   Cardiovascular: Negative.   Endocrine: Negative.   Musculoskeletal: Negative.   Neurological: Negative.   Hematological: Negative.   Psychiatric/Behavioral: Negative.   All other systems reviewed and are negative.    Objective: Vital Signs: LMP 08/10/2012 Comment: ovaries  remain//a.c.  Physical Exam Vitals signs and nursing note reviewed.  Constitutional:      Appearance: She is well-developed.  Pulmonary:     Effort: Pulmonary effort is normal.  Skin:    General: Skin is warm.     Capillary Refill: Capillary refill takes less than 2 seconds.  Neurological:     Mental Status: She is alert and oriented to person, place, and time.  Psychiatric:        Behavior: Behavior normal.        Thought Content: Thought content normal.        Judgment: Judgment normal.     Ortho Exam Left hip exam and knee exams are stable. Specialty Comments:  No specialty comments available.  Imaging: No results found.   PMFS History: Patient Active Problem List   Diagnosis Date Noted  . Primary osteoarthritis of left hip 08/22/2018  . Essential hypertension 11/25/2017  . Foot pain 11/23/2017  . Chronic joint pain 03/29/2015  . DM II (diabetes mellitus, type II), controlled ([place]) 10/01/2014  . Obesity 03/12/2014  . 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 Hyperlipidemia 09/10/2013  . Family history of cancer 08/30/2012  . GERD (gastroesophageal reflux disease) 07/03/2011   Past Medical History:  Diagnosis Date  . Allergy   . Anemia   . Anxiety   .  Arthritis   . Borderline hypertension   . Chest pain   . Degenerative lumbar disc    L5S1  . Diabetes mellitus without complication ([place])   . Diverticulitis   . Edema    lower extremity  . Family history of anesthesia complication    sister had BP complications during anes.  Marland Kitchen GERD (gastroesophageal reflux disease)   . H/O hiatal hernia   . Hypertension   . SOB (shortness of breath)     Family History  Problem Relation Age of Onset  . Heart attack Mother   . Diabetes Mother   . Hyperlipidemia Mother   . Hypertension Mother   . Heart attack Sister   . Pulmonary embolism Father   . Clotting disorder Father   . Diabetes Sister   . Kidney disease Sister        kidney failure  . Heart disease  Sister   . Cancer Sister   . Diabetes Son 12       TYPE 1    Past Surgical History:  Procedure Laterality Date  . ABDOMINAL HYSTERECTOMY    . BREAST LUMPECTOMY     3 lumps -- two from right -- one from left  . CESAREAN SECTION    . LAPAROSCOPIC ASSISTED VAGINAL HYSTERECTOMY N/A 09/18/2012   Procedure: LAPAROSCOPIC ASSISTED VAGINAL HYSTERECTOMY;  Surgeon: Linda Hedges, DO;  Location: [facility];  Service: Gynecology;  Laterality: N/A;  BLADDER  DISTENTION  . TUBAL LIGATION     Social History   Occupational History  . 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 Smoking status: Never Smoker  . Smokeless tobacco: Never Used  Substance and Sexual Activity  . Alcohol use: No  . Drug use: No  . Sexual activity: Not Currently

## 2018-08-06 ENCOUNTER — Ambulatory Visit (INDEPENDENT_AMBULATORY_CARE_PROVIDER_SITE_OTHER): Payer: BLUE CROSS/BLUE SHIELD | Admitting: Orthopaedic Surgery

## 2018-08-22 DIAGNOSIS — M1612 Unilateral primary osteoarthritis, left hip: Secondary | ICD-10-CM | POA: Insufficient documentation

## 2018-08-27 NOTE — Progress Notes (Signed)
  Coronavirus Screening  Have you experienced the following symptoms:  Cough yes/no: Yes- patient things its allergies scratchy throat started Monday after being outside in the yard over the weekend Fever (>100.5F)  yes/no: No Runny nose yes/no: No patient stated that she has been sneezing  Sore throat yes/no: No just a scratchy throat Difficulty breathing/shortness of breath  yes/no: No  Have you or a family member traveled in the last 14 days and where? yes/no: No   If the patient indicates "YES" to the above questions, their PAT will be rescheduled to limit the exposure to others and, the surgeon will be notified. THE PATIENT WILL NEED TO BE ASYMPTOMATIC FOR 14 DAYS.   If the patient is not experiencing any of these symptoms, the PAT nurse will instruct them to NOT bring anyone with them to their appointment since they may have these symptoms or traveled as well.   Please remind your patients and families that hospital visitation restrictions are in effect and the importance of the restrictions.

## 2018-08-27 NOTE — Pre-Procedure Instructions (Signed)
Angelica Sharp  08/27/2018      [facility] [name], [place] [place] [address] [place] [address] Phone: [phone] Fax: [phone]  [place], [address] [place] [address] Phone: [phone] Fax: [phone]    Your procedure is scheduled on March 30  Report to [facility].  Call this number if you have problems the morning of surgery:  [phone]   Remember:  Do not eat or drink after midnight.   Take these medicines the morning of surgery with A SIP OF WATER  ALPRAZolam (XANAX)   doxycycline (VIBRA-TABS)  esomeprazole (NEXIUM methocarbamol (ROBAXIN)  7 days prior to surgery STOP taking any diclofenac (VOLTAREN, Aspirin (unless otherwise instructed by your surgeon), Aleve, Naproxen, Ibuprofen, Motrin, Advil, Goody's, BC's, all herbal medications, fish oil, and all vitamins.   WHAT DO I DO ABOUT MY DIABETES MEDICATION?   Marland Kitchen Do not take oral diabetes medicines (pills) the morning of surgery. metFORMIN (GLUCOPHAGE-XR   How to Manage Your Diabetes Before and After Surgery  Why is it important to control my blood sugar before and after surgery? . Improving blood sugar levels before and after surgery helps healing and can limit problems. . A way of improving blood sugar control is eating a healthy diet by: o  Eating less sugar and carbohydrates o  Increasing activity/exercise o  Talking with your doctor about reaching your blood sugar goals . High blood sugars (greater than 180 mg/dL) can raise your risk of infections and slow your recovery, so you will need to focus on controlling your diabetes during the weeks before surgery. . Make sure that the doctor who takes care of your diabetes knows about your planned surgery including the date and location.  How do I manage my blood sugar before  surgery? . Check your blood sugar at least 4 times a day, starting 2 days before surgery, to make sure that the level is not too high or low. o Check your blood sugar the morning of your surgery when you wake up and every 2 hours until you get to the Short Stay unit. . If your blood sugar is less than 70 mg/dL, you will need to treat for low blood sugar: o Do not take insulin. o Treat a low blood sugar (less than 70 mg/dL) with  cup of clear juice (cranberry or apple), 4 glucose tablets, OR glucose gel. o Recheck blood sugar in 15 minutes after treatment (to make sure it is greater than 70 mg/dL). If your blood sugar is not greater than 70 mg/dL on recheck, call [phone] for further instructions. . Report your blood sugar to the short stay nurse when you get to Short Stay.  . If you are admitted to the hospital after surgery: o Your blood sugar will be checked by the staff and you will probably be given insulin after surgery (instead of oral diabetes medicines) to make sure you have good blood sugar levels. o The goal for blood sugar control after surgery is 80-180 mg/dL.    Do not wear jewelry, make-up or nail polish.  Do not wear lotions, powders, or perfumes, or deodorant.  Do not shave 48 hours prior to surgery.  Men may shave face and neck.  Do not bring valuables to the hospital.  [facility] is not responsible for any belongings  or valuables.  Contacts, dentures or bridgework may not be worn into surgery.  Leave your suitcase in the car.  After surgery it may be brought to your room.  For patients admitted to the hospital, discharge time will be determined by your treatment team.  Patients discharged the day of surgery will not be allowed to drive home.    Special instructions:   [facility]- Preparing For Surgery  Before surgery, you can play an important role. Because skin is not sterile, your skin needs to be as free of germs as possible. You can reduce the number of germs  on your skin by washing with CHG (chlorahexidine gluconate) Soap before surgery.  CHG is an antiseptic cleaner which kills germs and bonds with the skin to continue killing germs even after washing.    Oral Hygiene is also important to reduce your risk of infection.  Remember - BRUSH YOUR TEETH THE MORNING OF SURGERY WITH YOUR REGULAR TOOTHPASTE  Please do not use if you have an allergy to CHG or antibacterial soaps. If your skin becomes reddened/irritated stop using the CHG.  Do not shave (including legs and underarms) for at least 48 hours prior to first CHG shower. It is OK to shave your face.  Please follow these instructions carefully.   1. Shower the NIGHT BEFORE SURGERY and the MORNING OF SURGERY with CHG.   2. If you chose to wash your hair, wash your hair first as usual with your normal shampoo.  3. After you shampoo, rinse your hair and body thoroughly to remove the shampoo.  4. Use CHG as you would any other liquid soap. You can apply CHG directly to the skin and wash gently with a scrungie or a clean washcloth.   5. Apply the CHG Soap to your body ONLY FROM THE NECK DOWN.  Do not use on open wounds or open sores. Avoid contact with your eyes, ears, mouth and genitals (private parts). Wash Face and genitals (private parts)  with your normal soap.  6. Wash thoroughly, paying special attention to the area where your surgery will be performed.  7. Thoroughly rinse your body with warm water from the neck down.  8. DO NOT shower/wash with your normal soap after using and rinsing off the CHG Soap.  9. Pat yourself dry with a CLEAN TOWEL.  10. Wear CLEAN PAJAMAS to bed the night before surgery, wear comfortable clothes the morning of surgery  11. Place CLEAN SHEETS on your bed the night of your first shower and DO NOT SLEEP WITH PETS.    Day of Surgery:  Do not apply any deodorants/lotions.  Please wear clean clothes to the hospital/surgery center.   Remember to brush your  teeth WITH YOUR REGULAR TOOTHPASTE.    Please read over the following fact sheets that you were given.

## 2018-08-28 ENCOUNTER — Other Ambulatory Visit: Payer: Self-pay

## 2018-08-28 ENCOUNTER — Inpatient Hospital Stay (HOSPITAL_COMMUNITY)
Admission: RE | Admit: 2018-08-28 | Discharge: 2018-08-28 | Disposition: A | Discharge status: Home / Self Care | Payer: BLUE CROSS/BLUE SHIELD | Source: Ambulatory Visit

## 2018-08-28 ENCOUNTER — Ambulatory Visit (INDEPENDENT_AMBULATORY_CARE_PROVIDER_SITE_OTHER): Payer: BLUE CROSS/BLUE SHIELD | Admitting: Family Medicine

## 2018-08-28 ENCOUNTER — Other Ambulatory Visit: Payer: Self-pay | Admitting: Emergency Medicine

## 2018-08-28 DIAGNOSIS — E0800 Diabetes mellitus due to underlying condition with hyperosmolarity without nonketotic hyperglycemic-hyperosmolar coma (NKHHC): Secondary | ICD-10-CM | POA: Diagnosis not present

## 2018-08-28 DIAGNOSIS — Z1329 Encounter for screening for other suspected endocrine disorder: Secondary | ICD-10-CM | POA: Diagnosis not present

## 2018-08-28 DIAGNOSIS — E785 Hyperlipidemia, unspecified: Secondary | ICD-10-CM | POA: Diagnosis not present

## 2018-08-28 DIAGNOSIS — I10 Essential (primary) hypertension: Secondary | ICD-10-CM

## 2018-08-28 NOTE — Addendum Note (Signed)
Addended by: Norton Blizzard R on: 1/38/8719 09:06 AM   Modules accepted: Orders

## 2018-08-29 ENCOUNTER — Ambulatory Visit (INDEPENDENT_AMBULATORY_CARE_PROVIDER_SITE_OTHER): Payer: BLUE CROSS/BLUE SHIELD | Admitting: Family Medicine

## 2018-08-29 ENCOUNTER — Encounter: Payer: Self-pay | Admitting: Family Medicine

## 2018-08-29 VITALS — BP 130/70 | HR 86 | Temp 98.0°F | Ht 67.0 in | Wt 206.2 lb

## 2018-08-29 DIAGNOSIS — E785 Hyperlipidemia, unspecified: Secondary | ICD-10-CM

## 2018-08-29 DIAGNOSIS — Z0000 Encounter for general adult medical examination without abnormal findings: Secondary | ICD-10-CM

## 2018-08-29 DIAGNOSIS — E0800 Diabetes mellitus due to underlying condition with hyperosmolarity without nonketotic hyperglycemic-hyperosmolar coma (NKHHC): Secondary | ICD-10-CM

## 2018-08-29 DIAGNOSIS — I10 Essential (primary) hypertension: Secondary | ICD-10-CM

## 2018-08-29 DIAGNOSIS — R609 Edema, unspecified: Secondary | ICD-10-CM

## 2018-08-29 DIAGNOSIS — Z23 Encounter for immunization: Secondary | ICD-10-CM | POA: Diagnosis not present

## 2018-08-29 DIAGNOSIS — R350 Frequency of micturition: Secondary | ICD-10-CM | POA: Diagnosis not present

## 2018-08-29 DIAGNOSIS — K219 Gastro-esophageal reflux disease without esophagitis: Secondary | ICD-10-CM

## 2018-08-29 DIAGNOSIS — Z0001 Encounter for general adult medical examination with abnormal findings: Secondary | ICD-10-CM | POA: Diagnosis not present

## 2018-08-29 DIAGNOSIS — Z8719 Personal history of other diseases of the digestive system: Secondary | ICD-10-CM

## 2018-08-29 DIAGNOSIS — Z79899 Other long term (current) drug therapy: Secondary | ICD-10-CM

## 2018-08-29 LAB — COMPREHENSIVE METABOLIC PANEL
ALK PHOS: 96 IU/L (ref 39–117)
ALT: 25 IU/L (ref 0–32)
AST: 21 IU/L (ref 0–40)
Albumin/Globulin Ratio: 1.6 (ref 1.2–2.2)
Albumin: 4.4 g/dL (ref 3.8–4.9)
BUN/Creatinine Ratio: 12 (ref 9–23)
BUN: 11 mg/dL (ref 6–24)
Bilirubin Total: 0.3 mg/dL (ref 0.0–1.2)
CO2: 21 mmol/L (ref 20–29)
CREATININE: 0.9 mg/dL (ref 0.57–1.00)
Calcium: 9.5 mg/dL (ref 8.7–10.2)
Chloride: 100 mmol/L (ref 96–106)
GFR calc Af Amer: 84 mL/min/{1.73_m2} (ref >59)
GFR calc non Af Amer: 73 mL/min/{1.73_m2} (ref >59)
GLOBULIN, TOTAL: 2.7 g/dL (ref 1.5–4.5)
Glucose: 107 mg/dL — ABNORMAL HIGH (ref 65–99)
Potassium: 4.1 mmol/L (ref 3.5–5.2)
SODIUM: 141 mmol/L (ref 134–144)
Total Protein: 7.1 g/dL (ref 6.0–8.5)

## 2018-08-29 LAB — HEMOGLOBIN A1C
Est. average glucose Bld gHb Est-mCnc: 143 mg/dL
Hgb A1c MFr Bld: 6.6 % — ABNORMAL HIGH (ref 4.8–5.6)

## 2018-08-29 LAB — LIPID PANEL
Chol/HDL Ratio: 3.7 ratio (ref 0.0–4.4)
Cholesterol, Total: 161 mg/dL (ref 100–199)
HDL: 44 mg/dL (ref >39)
LDL Calculated: 83 mg/dL (ref 0–99)
Triglycerides: 170 mg/dL — ABNORMAL HIGH (ref 0–149)
VLDL Cholesterol Cal: 34 mg/dL (ref 5–40)

## 2018-08-29 LAB — TSH: TSH: 1.41 u[IU]/mL (ref 0.450–4.500)

## 2018-08-29 LAB — POCT URINALYSIS DIP (MANUAL ENTRY)
Bilirubin, UA: NEGATIVE
Blood, UA: NEGATIVE
GLUCOSE UA: NEGATIVE mg/dL
Ketones, POC UA: NEGATIVE mg/dL
LEUKOCYTES UA: NEGATIVE
Nitrite, UA: NEGATIVE
Protein Ur, POC: NEGATIVE mg/dL
Spec Grav, UA: 1.025 (ref 1.010–1.025)
Urobilinogen, UA: 0.2 E.U./dL
pH, UA: 7 (ref 5.0–8.0)

## 2018-08-29 MED ORDER — PRAVASTATIN SODIUM 40 MG PO TABS: 40.0000 mg | ORAL_TABLET | Freq: Every day | ORAL | 3 refills | Status: DC

## 2018-08-29 MED ORDER — ZOSTER VAC RECOMB ADJUVANTED 50 MCG/0.5ML IM SUSR: 0.5000 mL | Freq: Once | INTRAMUSCULAR | 1 refills | Status: AC

## 2018-08-29 MED ORDER — ESOMEPRAZOLE MAGNESIUM 40 MG PO CPDR: 40.0000 mg | DELAYED_RELEASE_CAPSULE | Freq: Every day | ORAL | 1 refills | Status: DC

## 2018-08-29 MED ORDER — HYDROCHLOROTHIAZIDE 25 MG PO TABS: 25.0000 mg | ORAL_TABLET | Freq: Every day | ORAL | 3 refills | Status: DC

## 2018-08-29 NOTE — Patient Instructions (Addendum)
No change in meds.  I will refer you to gastroenterology to discuss nexium/heartburn and decide if other testing needed. Can also discuss diverticulitis history as well.  Cholesterol borderline elevated, but can continue same dose of statin for now. I expect that to improve as exercise can improve after hip surgery. Can discuss aspirin with endocrinologist and gastroenterology.  claritin for allergies and cough. flonase option as well.  Return to the clinic or go to the nearest emergency room if any of your symptoms worsen or new symptoms occur. Thanks for coming in today.   Keeping You Healthy  Get These Tests  Blood Pressure- Have your blood pressure checked by your healthcare provider at least once a year.  Normal blood pressure is 120/80.  Weight- Have your body mass index (BMI) calculated to screen for obesity.  BMI is a measure of body fat based on height and weight.  You can calculate your own BMI at [employer]  Cholesterol- Have your cholesterol checked every year.  Diabetes- Have your blood sugar checked every year if you have high blood pressure, high cholesterol, a family history of diabetes or if you are overweight.  Pap Test - Have a pap test every 1 to 5 years if you have been sexually active.  If you are older than 65 and recent pap tests have been normal you may not need additional pap tests.  In addition, if you have had a hysterectomy  for benign disease additional pap tests are not necessary.  Mammogram-Yearly mammograms are essential for early detection of breast cancer  Screening for Colon Cancer- Colonoscopy starting at age 55. Screening may begin sooner depending on your family history and other health conditions.  Follow up colonoscopy as directed by your Gastroenterologist.  Screening for Osteoporosis- Screening begins at age 79 with bone density scanning, sooner if you are at higher risk for developing Osteoporosis.  Get these medicines  Calcium  with Vitamin D- Your body requires 1200-1500 mg of Calcium a day and [phone] IU of Vitamin D a day.  You can only absorb 500 mg of Calcium at a time therefore Calcium must be taken in 2 or 3 separate doses throughout the day.  Hormones- Hormone therapy has been associated with increased risk for certain cancers and heart disease.  Talk to your healthcare provider about if you need relief from menopausal symptoms.  Aspirin- Ask your healthcare provider about taking Aspirin to prevent Heart Disease and Stroke.  Get these Immuniztions  Flu shot- Every fall  Pneumonia shot- Once after the age of 53; if you are younger ask your healthcare provider if you need a pneumonia shot.  Tetanus- Every ten years.  Zostavax- Once after the age of 76 to prevent shingles.  Take these steps  Don't smoke- Your healthcare provider can help you quit. For tips on how to quit, ask your healthcare provider or go to www.smokefree.gov or call 1-800 QUIT-NOW.  Be physically active- Exercise 5 days a week for a minimum of 30 minutes.  If you are not already physically active, start slow and gradually work up to 30 minutes of moderate physical activity.  Try walking, dancing, bike riding, swimming, etc.  Eat a healthy diet- Eat a variety of healthy foods such as fruits, vegetables, whole grains, low fat milk, low fat cheeses, yogurt, lean meats, chicken, fish, eggs, dried beans, tofu, etc.  For more information go to www.thenutritionsource.org  Dental visit- Brush and floss teeth twice daily; visit your dentist twice a year.  Eye exam- Visit your Optometrist or Ophthalmologist yearly.  Drink alcohol in moderation- Limit alcohol intake to one drink or less a day.  Never drink and drive.  Depression- Your emotional health is as important as your physical health.  If you're feeling down or losing interest in things you normally enjoy, please talk to your healthcare provider.  Seat Belts- can save your life; always  wear one  Smoke/Carbon Monoxide detectors- These detectors need to be installed on the appropriate level of your home.  Replace batteries at least once a year.  Violence- If anyone is threatening or hurting you, please tell your healthcare provider.  Living Will/ Health care power of attorney- Discuss with your healthcare provider and family.  If you have lab work done today you will be contacted with your lab results within the next 2 weeks.  If you have not heard from [place] then please contact us. The fastest way to get your results is to register for My Chart.   IF you received an x-ray today, you will receive an invoice from [facility]. Please contact [facility] at [phone] with questions or concerns regarding your invoice.   IF you received labwork today, you will receive an invoice from [facility]. Please contact [facility] at [phone] with questions or concerns regarding your invoice.   Our billing staff will not be able to assist you with questions regarding bills from these companies.  You will be contacted with the lab results as soon as they are available. The fastest way to get your results is to activate your My Chart account. Instructions are located on the last page of this paperwork. If you have not heard from [place] regarding the results in 2 weeks, please contact this office.

## 2018-08-29 NOTE — Progress Notes (Signed)
Subjective:    Patient ID: Angelica Sharp, female    DOB: 1966-05-02, 53 y.o.   MRN: [id number]  HPI Angelica Sharp is a 53 y.o. female Presents today for: Chief Complaint  Patient presents with  . Annual Exam    CPE   Has been working from home.  Nephew passed earlier this week, had been dx. with CA prior and suffering so some relief.  Planning on left hip surgery - delayed due to Covid 19 concerns.   Diabetes: Followed by Dr. Posey Pronto - endocrinologist On statin, no ACE/ARB Metformin 750 mg twice daily. No new side effects.  Microalbumin: Due Optho, foot exam, pneumovax:  Foot exam performed today.  Sees optho yearly - ppwk may be sent to Dr. Posey Pronto.  Reaction to prior pneumonia vaccine - difficulty moving arm, temp 103.  Diabetic Foot Exam - Simple   Simple Foot Form Diabetic Foot exam was performed with the following findings:  Yes 08/29/2018  2:32 PM  Visual Inspection No deformities, no ulcerations, no other skin breakdown bilaterally:  Yes Sensation Testing Intact to touch and monofilament testing bilaterally:  Yes Pulse Check Posterior Tibialis and Dorsalis pulse intact bilaterally:  Yes Comments      Lab Results  Component Value Date   HGBA1C 6.6 (H) 08/28/2018   HGBA1C 6.5 03/29/2015   HGBA1C 6.5 (A) 03/29/2015   Lab Results  Component Value Date   MICROALBUR 0.8 03/29/2015   LDLCALC 83 08/28/2018   CREATININE 0.90 08/28/2018    Hypertension: BP Readings from Last 3 Encounters:  08/29/18 130/70  07/01/18 140/80  06/30/18 112/75   Lab Results  Component Value Date   CREATININE 0.90 08/28/2018  HCTZ 25 mg daily.   Hyperlipidemia:  Lab Results  Component Value Date   CHOL 161 08/28/2018   HDL 44 08/28/2018   LDLCALC 83 08/28/2018   TRIG 170 (H) 08/28/2018   CHOLHDL 3.7 08/28/2018   Lab Results  Component Value Date   ALT 25 08/28/2018   AST 21 08/28/2018   ALKPHOS 96 08/28/2018   BILITOT 0.3 08/28/2018   Pravastatin 40 mg nightly. No  new myalgias, no new side effects.  Situational anxiety: rare xanax need. Still has some left.   Occasional frequency - 1-2 times per month, resolves with azo for 1-2 days. Chronic frequent urination. MRI 07/28/18.    GERD: nexium 40mg  qd No hx PUD.  Needing daily - sxs return if skips a day.  On Ca/vit d supplement.  Rare coffee.  No wt loss, no dysphagia. No dark stools or abd pain.   Cancer screening: Colonoscopy 06/12/2011, plan for repeat 10 years.  Mammogram 10/07/2017, has not scheduled follow up yet.  Status post hysterectomy - benign reasons.   Immunization History  Administered Date(s) Administered  . Influenza Whole 04/18/2010  . Influenza,inj,Quad PF,6+ Mos 03/24/2013, 03/12/2014, 03/29/2015  . Pneumococcal Conjugate-13 07/23/2014  . Tdap 04/18/2011  Shingles vaccine:  Flu vaccine: agrees to today.   Depression screen [facility] 2/9 08/29/2018 07/01/2018 06/18/2018 05/02/2018 11/25/2017  Decreased Interest 0 0 0 0 0  Down, Depressed, Hopeless 0 0 0 0 0  PHQ - 2 Score 0 0 0 0 0  Altered sleeping - 0 - - -  Tired, decreased energy - 0 - - -  Change in appetite - 0 - - -  Feeling bad or failure about yourself  - 0 - - -  Trouble concentrating - 0 - - -  Moving slowly or fidgety/restless - 0 - - -  Suicidal thoughts - 0 - - -  PHQ-9 Score - 0 - - -  Difficult doing work/chores - Not difficult at all - - -   Dental: past 6 months.   Exercise: walking with work, but less recently due to hip pain.   Cough: Few weeks. Dry cough.  Pnd. No fever/wt loss, night sweats. Feels like allergies. Taking claritin. No nasal congestion. No dyspnea.  Results for orders placed or performed in visit on 08/28/18  Lipid Panel  Result Value Ref Range   Cholesterol, Total 161 100 - 199 mg/dL   Triglycerides 170 (H) 0 - 149 mg/dL   HDL 44 >39 mg/dL   VLDL Cholesterol Cal 34 5 - 40 mg/dL   LDL Calculated 83 0 - 99 mg/dL   Chol/HDL Ratio 3.7 0.0 - 4.4 ratio  Comprehensive metabolic panel   Result Value Ref Range   Glucose 107 (H) 65 - 99 mg/dL   BUN 11 6 - 24 mg/dL   Creatinine, Ser 0.90 0.57 - 1.00 mg/dL   GFR calc non Af Amer 73 >59 mL/min/1.73   GFR calc Af Amer 84 >59 mL/min/1.73   BUN/Creatinine Ratio 12 9 - 23   Sodium 141 134 - 144 mmol/L   Potassium 4.1 3.5 - 5.2 mmol/L   Chloride 100 96 - 106 mmol/L   CO2 21 20 - 29 mmol/L   Calcium 9.5 8.7 - 10.2 mg/dL   Total Protein 7.1 6.0 - 8.5 g/dL   Albumin 4.4 3.8 - 4.9 g/dL   Globulin, Total 2.7 1.5 - 4.5 g/dL   Albumin/Globulin Ratio 1.6 1.2 - 2.2   Bilirubin Total 0.3 0.0 - 1.2 mg/dL   Alkaline Phosphatase 96 39 - 117 IU/L   AST 21 0 - 40 IU/L   ALT 25 0 - 32 IU/L  Hemoglobin A1c  Result Value Ref Range   Hgb A1c MFr Bld 6.6 (H) 4.8 - 5.6 %   Est. average glucose Bld gHb Est-mCnc 143 mg/dL  TSH  Result Value Ref Range   TSH 1.410 0.450 - 4.500 uIU/mL     Patient Active Problem List   Diagnosis Date Noted  . Primary osteoarthritis of left hip 08/22/2018  . Essential hypertension 11/25/2017  . Foot pain 11/23/2017  . Chronic joint pain 03/29/2015  . DM II (diabetes mellitus, type II), controlled ([place]) 10/01/2014  . Obesity 03/12/2014  . 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 Hyperlipidemia 09/10/2013  . Family history of cancer 08/30/2012  . GERD (gastroesophageal reflux disease) 07/03/2011   Past Medical History:  Diagnosis Date  . Allergy   . Anemia   . Anxiety   . Arthritis   . Borderline hypertension   . Chest pain   . Degenerative lumbar disc    L5S1  . Diabetes mellitus without complication ([place])   . Diverticulitis   . Edema    lower extremity  . Family history of anesthesia complication    sister had BP complications during anes.  Marland Kitchen GERD (gastroesophageal reflux disease)   . H/O hiatal hernia   . Hypertension   . SOB (shortness of breath)    Past Surgical History:  Procedure Laterality Date  . ABDOMINAL HYSTERECTOMY    . BREAST LUMPECTOMY     3 lumps -- two from right --  one from left  . CESAREAN SECTION    . LAPAROSCOPIC ASSISTED VAGINAL HYSTERECTOMY N/A 09/18/2012   Procedure: LAPAROSCOPIC ASSISTED VAGINAL HYSTERECTOMY;  Surgeon: Linda Hedges, DO;  [facility]  ORS;  Service: Gynecology;  Laterality: N/A;  BLADDER  DISTENTION  . TUBAL LIGATION     Allergies  Allergen Reactions  . Morphine And Related     Headache and hallucinations  . Septra [Sulfamethoxazole-Trimethoprim]     Increased heartrate   Prior to Admission medications   Medication Sig Start Date End Date Taking? Authorizing Provider  ALPRAZolam (XANAX) 0.25 MG tablet Take 0.5-1 tablets (0.125-0.25 mg total) by mouth at bedtime as needed for anxiety. 05/02/18  Yes Wendie Agreste, MD  aspirin EC 81 MG tablet Take 81 mg by mouth daily.   Yes [provider]  benzonatate (TESSALON) 200 MG capsule Take 1 capsule (200 mg total) by mouth 3 (three) times daily as needed for cough. 06/18/18  Yes McVey, Gelene Mink, PA-C  Calcium Carb-Cholecalciferol (CALCIUM+D3 PO) Take 1 tablet by mouth daily.   Yes [provider]  diclofenac (VOLTAREN) 75 MG EC tablet Take 1 tablet (75 mg total) by mouth 2 (two) times daily. Appointment needed for more refills. 05/15/18  Yes Forrest Moron, MD  doxycycline (VIBRA-TABS) 100 MG tablet Take 1 tablet (100 mg total) by mouth 2 (two) times daily. 06/18/18  Yes McVey, Gelene Mink, PA-C  esomeprazole (NEXIUM) 40 MG capsule Take 1 capsule (40 mg total) by mouth daily. 03/20/18  Yes Timmothy Euler, [place] D, PA-C  glucose blood test strip Use as instructed 05/10/15  Yes Jeffery, Chelle, PA  hydrochlorothiazide (HYDRODIURIL) 25 MG tablet TAKE 1 TABLET BY MOUTH ONCE DAILY Patient taking differently: Take 25 mg by mouth daily.  01/27/18  Yes Tenna Delaine D, PA-C  HYDROcodone-acetaminophen (NORCO/VICODIN) 5-325 MG tablet Take 1 tablet by mouth every 6 (six) hours as needed for moderate pain. 07/10/18  Yes Leandrew Koyanagi, MD  HYDROcodone-homatropine  [facility]) 5-1.5 MG/5ML syrup Take 5 mLs by mouth every 8 (eight) hours as needed for cough. 06/18/18  Yes McVey, Gelene Mink, PA-C  ibuprofen (ADVIL,MOTRIN) 200 MG tablet Take 800 mg by mouth 3 (three) times daily as needed (pain.).   Yes [provider]  Liniments (SALONPAS PAIN RELIEF PATCH EX) Place 1 patch onto the skin daily as needed (pain.).   Yes [provider]  metFORMIN (GLUCOPHAGE-XR) 750 MG 24 hr tablet Take 750 mg by mouth 2 (two) times daily.  12/17/16  Yes [provider]  methocarbamol (ROBAXIN) 500 MG tablet Take 1 tablet (500 mg total) by mouth 2 (two) times daily. Patient taking differently: Take 500 mg by mouth 2 (two) times daily as needed (muscle spasms).  06/30/18  Yes Kendrick, Caitlyn S, PA-C  Phenazopyridine HCl (AZO URINARY PAIN PO) Take 2 tablets by mouth daily as needed (urinary discomfort).   Yes [provider]  polyethylene glycol (MIRALAX / GLYCOLAX) packet Take 17 g by mouth daily as needed for mild constipation.    Yes [provider]  Potassium 99 MG TABS Take 198 mg by mouth daily.   Yes [provider]  pravastatin (PRAVACHOL) 40 MG tablet TAKE 1 TABLET BY MOUTH ONCE DAILY Patient taking differently: Take 40 mg by mouth at bedtime.  12/24/17  Yes Tenna Delaine D, PA-C  calcium carbonate 200 MG capsule TWICE WEEKLY  08/29/11  [provider]  ranitidine (ZANTAC) 150 MG capsule Take 150 mg by mouth every evening.  08/29/11  [provider]   Social History   Socioeconomic History  . Marital status: Divorced    Spouse name: Not on file  . Number of children: 3  .  Years of education: Not on file  . Highest education level: Not on file  Occupational History  . 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 Financial resource strain: Not on file  . Food insecurity:    Worry: Not on file    Inability: Not  on file  . Transportation needs:    Medical: Not on file    Non-medical: Not on file  Tobacco Use  . Smoking status: Never Smoker  . Smokeless tobacco: Never Used  Substance and Sexual Activity  . Alcohol use: No  . Drug use: No  . Sexual activity: Not Currently  Lifestyle  . Physical activity:    Days per week: Not on file    Minutes per session: Not on file  . Stress: Not on file  Relationships  . Social connections:    Talks on phone: Not on file    Gets together: Not on file    Attends religious service: Not on file    Active member of club or organization: Not on file    Attends meetings of clubs or organizations: Not on file    Relationship status: Not on file  . Intimate partner violence:    Fear of current or ex partner: Not on file    Emotionally abused: Not on file    Physically abused: Not on file    Forced sexual activity: Not on file  Other Topics Concern  . Not on file  Social History Narrative   Lives with daughter and grandkids.      Review of Systems 13 point review of systems per patient health survey noted.  Negative other than as indicated above or in HPI.      Objective:   Physical Exam Vitals signs reviewed.  Constitutional:      Appearance: She is well-developed.  HENT:     Head: Normocephalic and atraumatic.     Right Ear: External ear normal.     Left Ear: External ear normal.  Eyes:     Conjunctiva/sclera: Conjunctivae normal.     Pupils: Pupils are equal, round, and reactive to light.  Neck:     Musculoskeletal: Normal range of motion and neck supple.     Thyroid: No thyromegaly.  Cardiovascular:     Rate and Rhythm: Normal rate and regular rhythm.     Heart sounds: Normal heart sounds. No murmur.  Pulmonary:     Effort: Pulmonary effort is normal. No respiratory distress.     Breath sounds: Normal breath sounds. No wheezing.  Abdominal:     General: Bowel sounds are normal.     Palpations: Abdomen is soft.     Tenderness:  There is no abdominal tenderness.  Musculoskeletal: Normal range of motion.        General: No tenderness.  Lymphadenopathy:     Cervical: No cervical adenopathy.  Skin:    General: Skin is warm and dry.     Findings: No rash.  Neurological:     Mental Status: She is alert and oriented to person, place, and time.  Psychiatric:        Behavior: Behavior normal.        Thought Content: Thought content normal.    Vitals:   08/29/18 1431  BP: 130/70  Pulse: 86  Temp: 98 F (36.7 C)  TempSrc: Oral  SpO2: 97%  Weight: 206 lb 3.2  oz (93.5 kg)  Height: 5\' 7"  (1.702 m)    UA reviewed. WNL.     Assessment & Plan:   Angelica Sharp is a 53 y.o. female Annual physical exam  - -anticipatory guidance as below in AVS, screening labs above. Health maintenance items as above in HPI discussed/recommended as applicable.   Diabetes mellitus due to underlying condition with hyperosmolarity without coma, without long-term current use of insulin ([place]) - Plan: Microalbumin / creatinine urine ratio  - followed by endocrinology.   Need for shingles vaccine - Plan: Zoster Vaccine Adjuvanted [facility]) injection sent to pharmacy.   Hyperlipidemia, unspecified hyperlipidemia type - Plan: pravastatin (PRAVACHOL) 40 MG tablet  -  Stable, tolerating current regimen. Medications refilled. Labs pending as above.   Edema, unspecified type - Plan: hydrochlorothiazide (HYDRODIURIL) 25 MG tablet Essential hypertension - Plan: hydrochlorothiazide (HYDRODIURIL) 25 MG tablet  -  Stable, tolerating current regimen. Medications refilled. Labs pending as above.   Current use of proton pump inhibitor - Plan: Magnesium, Vitamin D, 25-hydroxy, CBC Gastroesophageal reflux disease, esophagitis presence not specified - Plan: Magnesium, Vitamin D, 25-hydroxy, CBC, Ambulatory referral to Gastroenterology, esomeprazole (NEXIUM) 40 MG capsule  - check for deficiencies above with chronic Nexium 40mg  use.   - due to  persistent need of higher dose PPI, will refer to GI.   History of diverticulitis - Plan: Ambulatory referral to Gastroenterology  - denies recent flair, multiple in past. Can discuss with GI, but no changes for now in monitoring. RTC precuations if recurrence of sx's.   Urinary frequency - Plan: POCT urinalysis dipstick, POCT Microscopic Urinalysis ([facility])  - chronic without recent changes. UA reassuring. RTC precautions.   ALL rhinitis   - sx care with flonase, claritin, rtc precautions.    Flu vaccine need - Plan: Flu Vaccine QUAD 36+ mos IM   Meds ordered this encounter  Medications  . Zoster Vaccine Adjuvanted [facility]) injection    Sig: Inject 0.5 mLs into the muscle once for 1 dose. Repeat in 2-6 months.    Dispense:  0.5 mL    Refill:  1  . pravastatin (PRAVACHOL) 40 MG tablet    Sig: Take 1 tablet (40 mg total) by mouth daily.    Dispense:  90 tablet    Refill:  3  . hydrochlorothiazide (HYDRODIURIL) 25 MG tablet    Sig: Take 1 tablet (25 mg total) by mouth daily.    Dispense:  90 tablet    Refill:  3  . esomeprazole (NEXIUM) 40 MG capsule    Sig: Take 1 capsule (40 mg total) by mouth daily.    Dispense:  90 capsule    Refill:  1   Patient Instructions   No change in meds.  I will refer you to gastroenterology to discuss nexium/heartburn and decide if other testing needed. Can also discuss diverticulitis history as well.  Cholesterol borderline elevated, but can continue same dose of statin for now. I expect that to improve as exercise can improve after hip surgery. Can discuss aspirin with endocrinologist and gastroenterology.  claritin for allergies and cough. flonase option as well.  Return to the clinic or go to the nearest emergency room if any of your symptoms worsen or new symptoms occur. Thanks for coming in today.   Keeping You Healthy  Get These Tests  Blood Pressure- Have your blood pressure checked by your healthcare provider at least once a year.   Normal blood pressure is 120/80.  Weight- Have your body mass  index (BMI) calculated to screen for obesity.  BMI is a measure of body fat based on height and weight.  You can calculate your own BMI at [employer].it  Cholesterol- Have your cholesterol checked every year.  Diabetes- Have your blood sugar checked every year if you have high blood pressure, high cholesterol, a family history of diabetes or if you are overweight.  Pap Test - Have a pap test every 1 to 5 years if you have been sexually active.  If you are older than 65 and recent pap tests have been normal you may not need additional pap tests.  In addition, if you have had a hysterectomy  for benign disease additional pap tests are not necessary.  Mammogram-Yearly mammograms are essential for early detection of breast cancer  Screening for Colon Cancer- Colonoscopy starting at age 60. Screening may begin sooner depending on your family history and other health conditions.  Follow up colonoscopy as directed by your Gastroenterologist.  Screening for Osteoporosis- Screening begins at age 3 with bone density scanning, sooner if you are at higher risk for developing Osteoporosis.  Get these medicines  Calcium with Vitamin D- Your body requires 1200-1500 mg of Calcium a day and [phone] IU of Vitamin D a day.  You can only absorb 500 mg of Calcium at a time therefore Calcium must be taken in 2 or 3 separate doses throughout the day.  Hormones- Hormone therapy has been associated with increased risk for certain cancers and heart disease.  Talk to your healthcare provider about if you need relief from menopausal symptoms.  Aspirin- Ask your healthcare provider about taking Aspirin to prevent Heart Disease and Stroke.  Get these Immuniztions  Flu shot- Every fall  Pneumonia shot- Once after the age of 34; if you are younger ask your healthcare provider if you need a pneumonia shot.  Tetanus- Every ten years.   Zostavax- Once after the age of 36 to prevent shingles.  Take these steps  Don't smoke- Your healthcare provider can help you quit. For tips on how to quit, ask your healthcare provider or go to www.smokefree.gov or call 1-800 QUIT-NOW.  Be physically active- Exercise 5 days a week for a minimum of 30 minutes.  If you are not already physically active, start slow and gradually work up to 30 minutes of moderate physical activity.  Try walking, dancing, bike riding, swimming, etc.  Eat a healthy diet- Eat a variety of healthy foods such as fruits, vegetables, whole grains, low fat milk, low fat cheeses, yogurt, lean meats, chicken, fish, eggs, dried beans, tofu, etc.  For more information go to www.thenutritionsource.org  Dental visit- Brush and floss teeth twice daily; visit your dentist twice a year.  Eye exam- Visit your Optometrist or Ophthalmologist yearly.  Drink alcohol in moderation- Limit alcohol intake to one drink or less a day.  Never drink and drive.  Depression- Your emotional health is as important as your physical health.  If you're feeling down or losing interest in things you normally enjoy, please talk to your healthcare provider.  Seat Belts- can save your life; always wear one  Smoke/Carbon Monoxide detectors- These detectors need to be installed on the appropriate level of your home.  Replace batteries at least once a year.  Violence- If anyone is threatening or hurting you, please tell your healthcare provider.  Living Will/ Health care power of attorney- Discuss with your healthcare provider and family.  If you have lab work done today you will  be contacted with your lab results within the next 2 weeks.  If you have not heard from [place] then please contact us. The fastest way to get your results is to register for My Chart.   IF you received an x-ray today, you will receive an invoice from [facility]. Please contact [facility] at [phone] with  questions or concerns regarding your invoice.   IF you received labwork today, you will receive an invoice from [facility]. Please contact [facility] at [phone] with questions or concerns regarding your invoice.   Our billing staff will not be able to assist you with questions regarding bills from these companies.  You will be contacted with the lab results as soon as they are available. The fastest way to get your results is to activate your My Chart account. Instructions are located on the last page of this paperwork. If you have not heard from [place] regarding the results in 2 weeks, please contact this office.       Signed,   [name], MD [facility] [place].  08/31/18 11:15 PM

## 2018-08-30 LAB — MICROALBUMIN / CREATININE URINE RATIO
Creatinine, Urine: 59 mg/dL
Microalb/Creat Ratio: <5 mg/g creat (ref 0–29)
Microalbumin, Urine: <3 ug/mL

## 2018-08-31 ENCOUNTER — Encounter: Payer: Self-pay | Admitting: Family Medicine

## 2018-09-05 LAB — CBC
HEMOGLOBIN: 11.3 g/dL (ref 11.1–15.9)
Hematocrit: 37.4 % (ref 34.0–46.6)
MCH: 24.2 pg — ABNORMAL LOW (ref 26.6–33.0)
MCHC: 30.2 g/dL — ABNORMAL LOW (ref 31.5–35.7)
MCV: 80 fL (ref 79–97)
Platelets: 327 10*3/uL (ref 150–450)
RBC: 4.67 x10E6/uL (ref 3.77–5.28)
RDW: 15.9 % — ABNORMAL HIGH (ref 11.7–15.4)
WBC: 7.9 10*3/uL (ref 3.4–10.8)

## 2018-09-05 LAB — VITAMIN D 25 HYDROXY (VIT D DEFICIENCY, FRACTURES): Vit D, 25-Hydroxy: 26.8 ng/mL — ABNORMAL LOW (ref 30.0–100.0)

## 2018-09-05 LAB — MAGNESIUM

## 2018-09-05 LAB — SPECIMEN STATUS REPORT

## 2018-09-08 ENCOUNTER — Inpatient Hospital Stay: Admit: 2018-09-08 | Payer: BLUE CROSS/BLUE SHIELD | Admitting: Orthopaedic Surgery

## 2018-09-08 SURGERY — ARTHROPLASTY, HIP, TOTAL, ANTERIOR APPROACH
Anesthesia: Spinal | Laterality: Left

## 2018-10-27 ENCOUNTER — Other Ambulatory Visit: Payer: Self-pay

## 2018-10-29 NOTE — Pre-Procedure Instructions (Signed)
Angelica Sharp  10/29/2018     [facility] [name], [place] [address] Phone: [phone] Fax: [phone]  [name], [name] [address] Phone: [phone] Fax: [phone]    Your procedure is scheduled on Monday, June 1st.  Report to [facility] Entrance A at 8:20 A.M.  Call this number if you have problems the morning of surgery:  [phone]   Remember:  Do not eat after midnight.  You may drink clear liquids until 6:20 A.M.  Clear liquids allowed are:    Water, Juice (non-citric and without pulp), Carbonated beverages, Clear Tea, Black Coffee only, Plain Jell-O only, Gatorade and Plain Popsicles only    Take these medicines the morning of surgery with A SIP OF WATER  esomeprazole (NEXIUM)  If needed -  acetaminophen (TYLENOL), loperamide (IMODIUM A-D), loratadine (CLARITIN), methocarbamol (ROBAXIN), ondansetron (ZOFRAN-ODT), sodium chloride (OCEAN) 0.65 % SOLN nasal spray, Tetrahydrozoline HCl   Follow your surgeon's instructions on when to stop Aspirin.  If no instructions were given by your surgeon then you will need to call the office to get those instructions.    7 days prior to surgery STOP taking any Aspirin (unless otherwise instructed by your surgeon), Aleve, Naproxen, Ibuprofen, Motrin, Advil, Goody's, BC's, all herbal medications, fish oil, and all vitamins.  How to Manage Your Diabetes Before and After Surgery  Why is it important to control my blood sugar before and after surgery? . Improving blood sugar levels before and after surgery helps healing and can limit problems. . A way of improving blood sugar control is eating a healthy diet by: o  Eating less sugar and carbohydrates o  Increasing activity/exercise o  Talking with your doctor about reaching your blood sugar goals . High  blood sugars (greater than 180 mg/dL) can raise your risk of infections and slow your recovery, so you will need to focus on controlling your diabetes during the weeks before surgery. . Make sure that the doctor who takes care of your diabetes knows about your planned surgery including the date and location.  How do I manage my blood sugar before surgery? . Check your blood sugar at least 4 times a day, starting 2 days before surgery, to make sure that the level is not too high or low. o Check your blood sugar the morning of your surgery when you wake up and every 2 hours until you get to the Short Stay unit. . If your blood sugar is less than 70 mg/dL, you will need to treat for low blood sugar: o Do not take insulin. o Treat a low blood sugar (less than 70 mg/dL) with  cup of clear juice (cranberry or apple), 4 glucose tablets, OR glucose gel. Recheck blood sugar in 15 minutes after treatment (to make sure it is greater than 70 mg/dL). If your blood sugar is not greater than 70 mg/dL on recheck, call [phone] o  for further instructions. . Report your blood sugar to the short stay nurse when you get to Short Stay.  . If you are admitted to the hospital after surgery: o Your blood sugar will be checked by the staff and you will probably be given insulin after surgery (instead of oral diabetes medicines) to make sure you have good blood sugar levels. o The goal for blood sugar control after  surgery is 80-180 mg/dL.  WHAT DO I DO ABOUT MY DIABETES MEDICATION?  Marland Kitchen Do not take metFORMIN (GLUCOPHAGE-XR)/ oral diabetes medicines (pills) the morning of surgery.  . The day of surgery, do not take other diabetes injectables, including Byetta (exenatide), Bydureon (exenatide ER), Victoza (liraglutide), or Trulicity (dulaglutide).  . If your CBG is greater than 220 mg/dL, you may take  of your sliding scale (correction) dose of insulin.  Reviewed and Endorsed by [facility] Patient Education  Committee, August 2015   Do not wear jewelry, make-up or nail polish.  Do not wear lotions, powders, or perfumes, or deodorant.  Do not shave 48 hours prior to surgery.    Do not bring valuables to the hospital.  [facility] is not responsible for any belongings or valuables.  Contacts, dentures or bridgework may not be worn into surgery.  Leave your suitcase in the car.  After surgery it may be brought to your room.  For patients admitted to the hospital, discharge time will be determined by your treatment team.  Patients discharged the day of surgery will not be allowed to drive home.   Special instructions:  [facility]- Preparing For Surgery  Before surgery, you can play an important role. Because skin is not sterile, your skin needs to be as free of germs as possible. You can reduce the number of germs on your skin by washing with CHG (chlorahexidine gluconate) Soap before surgery.  CHG is an antiseptic cleaner which kills germs and bonds with the skin to continue killing germs even after washing.    Oral Hygiene is also important to reduce your risk of infection.  Remember - BRUSH YOUR TEETH THE MORNING OF SURGERY WITH YOUR REGULAR TOOTHPASTE  Please do not use if you have an allergy to CHG or antibacterial soaps. If your skin becomes reddened/irritated stop using the CHG.  Do not shave (including legs and underarms) for at least 48 hours prior to first CHG shower. It is OK to shave your face.  Please follow these instructions carefully.   1. Shower the NIGHT BEFORE SURGERY and the MORNING OF SURGERY with CHG.   2. If you chose to wash your hair, wash your hair first as usual with your normal shampoo.  3. After you shampoo, rinse your hair and body thoroughly to remove the shampoo.  4. Use CHG as you would any other liquid soap. You can apply CHG directly to the skin and wash gently with a scrungie or a clean washcloth.   5. Apply the CHG Soap to your body ONLY FROM THE NECK  DOWN.  Do not use on open wounds or open sores. Avoid contact with your eyes, ears, mouth and genitals (private parts). Wash Face and genitals (private parts)  with your normal soap.  6. Wash thoroughly, paying special attention to the area where your surgery will be performed.  7. Thoroughly rinse your body with warm water from the neck down.  8. DO NOT shower/wash with your normal soap after using and rinsing off the CHG Soap.  9. Pat yourself dry with a CLEAN TOWEL.  10. Wear CLEAN PAJAMAS to bed the night before surgery, wear comfortable clothes the morning of surgery  11. Place CLEAN SHEETS on your bed the night of your first shower and DO NOT SLEEP WITH PETS.  Day of Surgery:  Do not apply any deodorants/lotions.  Please wear clean clothes to the hospital/surgery center.   Remember to brush your teeth WITH YOUR  REGULAR TOOTHPASTE.  Please read over the following fact sheets that you were given. Pain Booklet, Coughing and Deep Breathing, Total Joint Packet, MRSA Information and Surgical Site Infection Prevention

## 2018-10-30 ENCOUNTER — Encounter (HOSPITAL_COMMUNITY): Payer: Self-pay

## 2018-10-30 ENCOUNTER — Ambulatory Visit (HOSPITAL_COMMUNITY)
Admission: RE | Admit: 2018-10-30 | Discharge: 2018-10-30 | Disposition: A | Discharge status: Home / Self Care | Payer: BLUE CROSS/BLUE SHIELD | Source: Ambulatory Visit | Attending: Physician Assistant | Admitting: Physician Assistant

## 2018-10-30 ENCOUNTER — Encounter (HOSPITAL_COMMUNITY)
Admission: RE | Admit: 2018-10-30 | Discharge: 2018-10-30 | Disposition: A | Discharge status: Home / Self Care | Payer: BLUE CROSS/BLUE SHIELD | Source: Ambulatory Visit | Attending: Orthopaedic Surgery | Admitting: Orthopaedic Surgery

## 2018-10-30 ENCOUNTER — Other Ambulatory Visit: Payer: Self-pay

## 2018-10-30 DIAGNOSIS — M1612 Unilateral primary osteoarthritis, left hip: Secondary | ICD-10-CM | POA: Diagnosis not present

## 2018-10-30 DIAGNOSIS — Z01818 Encounter for other preprocedural examination: Secondary | ICD-10-CM | POA: Diagnosis not present

## 2018-10-30 HISTORY — DX: Other specified postprocedural states: Z98.890

## 2018-10-30 HISTORY — DX: Fatty (change of) liver, not elsewhere classified: K76.0

## 2018-10-30 HISTORY — DX: Nausea with vomiting, unspecified: R11.2

## 2018-10-30 HISTORY — DX: Dyspnea, unspecified: R06.00

## 2018-10-30 HISTORY — DX: Pneumonia, unspecified organism: J18.9

## 2018-10-30 HISTORY — DX: Other complications of anesthesia, initial encounter: T88.59XA

## 2018-10-30 HISTORY — DX: Headache, unspecified: R51.9

## 2018-10-30 LAB — COMPREHENSIVE METABOLIC PANEL
ALT: 34 U/L (ref 0–44)
AST: 30 U/L (ref 15–41)
Albumin: 3.9 g/dL (ref 3.5–5.0)
Alkaline Phosphatase: 99 U/L (ref 38–126)
Anion gap: 10 (ref 5–15)
BUN: 12 mg/dL (ref 6–20)
CO2: 27 mmol/L (ref 22–32)
Calcium: 9.6 mg/dL (ref 8.9–10.3)
Chloride: 102 mmol/L (ref 98–111)
Creatinine, Ser: 0.93 mg/dL (ref 0.44–1.00)
GFR calc Af Amer: >60 mL/min (ref >60)
GFR calc non Af Amer: >60 mL/min (ref >60)
Glucose, Bld: 124 mg/dL — ABNORMAL HIGH (ref 70–99)
Potassium: 3.6 mmol/L (ref 3.5–5.1)
Sodium: 139 mmol/L (ref 135–145)
Total Bilirubin: 0.5 mg/dL (ref 0.3–1.2)
Total Protein: 7.3 g/dL (ref 6.5–8.1)

## 2018-10-30 LAB — CBC WITH DIFFERENTIAL/PLATELET
Abs Immature Granulocytes: 0.03 10*3/uL (ref 0.00–0.07)
Basophils Absolute: 0.1 10*3/uL (ref 0.0–0.1)
Basophils Relative: 1 %
Eosinophils Absolute: 0.2 10*3/uL (ref 0.0–0.5)
Eosinophils Relative: 2 %
HCT: 38.5 % (ref 36.0–46.0)
Hemoglobin: 11.8 g/dL — ABNORMAL LOW (ref 12.0–15.0)
Immature Granulocytes: 0 %
Lymphocytes Relative: 21 %
Lymphs Abs: 2 10*3/uL (ref 0.7–4.0)
MCH: 24.1 pg — ABNORMAL LOW (ref 26.0–34.0)
MCHC: 30.6 g/dL (ref 30.0–36.0)
MCV: 78.6 fL — ABNORMAL LOW (ref 80.0–100.0)
Monocytes Absolute: 0.4 10*3/uL (ref 0.1–1.0)
Monocytes Relative: 4 %
Neutro Abs: 6.7 10*3/uL (ref 1.7–7.7)
Neutrophils Relative %: 72 %
Platelets: 381 10*3/uL (ref 150–400)
RBC: 4.9 MIL/uL (ref 3.87–5.11)
RDW: 14.5 % (ref 11.5–15.5)
WBC: 9.3 10*3/uL (ref 4.0–10.5)
nRBC: 0 % (ref 0.0–0.2)

## 2018-10-30 LAB — TYPE AND SCREEN
ABO/RH(D): O POS
Antibody Screen: NEGATIVE

## 2018-10-30 LAB — SURGICAL PCR SCREEN
MRSA, PCR: NEGATIVE
Staphylococcus aureus: NEGATIVE

## 2018-10-30 LAB — PROTIME-INR
INR: 1 (ref 0.8–1.2)
Prothrombin Time: 13.5 seconds (ref 11.4–15.2)

## 2018-10-30 LAB — ABO/RH: ABO/RH(D): O POS

## 2018-10-30 LAB — HEMOGLOBIN A1C
Hgb A1c MFr Bld: 6.8 % — ABNORMAL HIGH (ref 4.8–5.6)
Mean Plasma Glucose: 148.46 mg/dL

## 2018-10-30 LAB — APTT: aPTT: 30 seconds (ref 24–36)

## 2018-10-30 LAB — GLUCOSE, CAPILLARY: Glucose-Capillary: 97 mg/dL (ref 70–99)

## 2018-10-30 NOTE — Progress Notes (Signed)
PCP - Dr. [name] Cardiologist - Dr. Johnsie Cancel - per patient "hasn't seen him in 3-4 years"  Chest x-ray - 10/30/2018   EKG - 10/30/2018 Stress Test -  07/28/08 ECHO - 07/28/08 Cardiac Cath - denies  Sleep Study - denies CPAP - N/A  Fasting Blood Sugar - 125 -170 Checks Blood Sugar 3X/ week  Blood Thinner Instructions:N/A Aspirin Instructions: 7 days prior surgery  Coronavirus Screening  Have you experienced the following symptoms:  Cough yes/no: No Fever (>100.54F)  yes/no: No Runny nose yes/no: No Sore throat yes/no: No Difficulty breathing/shortness of breath  yes/no: No  Have you or a family member traveled in the last 14 days and where? yes/no: Non  If the patient indicates "YES" to the above questions, their PAT will be rescheduled to limit the exposure to others and, the surgeon will be notified. THE PATIENT WILL NEED TO BE ASYMPTOMATIC FOR 14 DAYS.   If the patient is not experiencing any of these symptoms, the PAT nurse will instruct them to NOT bring anyone with them to their appointment since they may have these symptoms or traveled as well.   Please remind your patients and families that hospital visitation restrictions are in effect and the importance of the restrictions.   Anesthesia review: No  Patient denies shortness of breath, fever, cough and chest pain at PAT appointment   Patient verbalized understanding of instructions that were given to them at the PAT appointment. Patient was also instructed that they will need to review over the PAT instructions again at home before surgery.

## 2018-11-06 ENCOUNTER — Other Ambulatory Visit (HOSPITAL_COMMUNITY)
Admission: RE | Admit: 2018-11-06 | Discharge: 2018-11-06 | Disposition: A | Discharge status: Home / Self Care | Payer: BC Managed Care – PPO | Source: Ambulatory Visit | Attending: Orthopaedic Surgery | Admitting: Orthopaedic Surgery

## 2018-11-06 DIAGNOSIS — Z1159 Encounter for screening for other viral diseases: Secondary | ICD-10-CM | POA: Diagnosis not present

## 2018-11-07 LAB — NOVEL CORONAVIRUS, NAA (HOSP ORDER, SEND-OUT TO REF LAB; TAT 18-24 HRS): SARS-CoV-2, NAA: NOT DETECTED

## 2018-11-08 MED ORDER — TRANEXAMIC ACID-NACL 1000-0.7 MG/100ML-% IV SOLN
1000.0000 mg | INTRAVENOUS | Status: AC
  Administered 2018-11-10: 1000 mg via INTRAVENOUS
  Filled 2018-11-08: qty 100

## 2018-11-08 MED ORDER — TRANEXAMIC ACID 1000 MG/10ML IV SOLN
2000.0000 mg | INTRAVENOUS | Status: AC
  Administered 2018-11-10: 11:00:00 2000 mg via TOPICAL
  Filled 2018-11-08: qty 20

## 2018-11-10 ENCOUNTER — Inpatient Hospital Stay (HOSPITAL_COMMUNITY): Payer: BC Managed Care – PPO | Admitting: Physician Assistant

## 2018-11-10 ENCOUNTER — Inpatient Hospital Stay (HOSPITAL_COMMUNITY): Payer: BC Managed Care – PPO | Admitting: Anesthesiology

## 2018-11-10 ENCOUNTER — Other Ambulatory Visit: Payer: Self-pay

## 2018-11-10 ENCOUNTER — Encounter (HOSPITAL_COMMUNITY)
Admission: RE | Disposition: A | Discharge status: Home Health | Payer: Self-pay | Source: Home / Self Care | Attending: Orthopaedic Surgery

## 2018-11-10 ENCOUNTER — Inpatient Hospital Stay (HOSPITAL_COMMUNITY): Payer: BC Managed Care – PPO

## 2018-11-10 ENCOUNTER — Inpatient Hospital Stay (HOSPITAL_COMMUNITY)
Admission: RE | Admit: 2018-11-10 | Discharge: 2018-11-12 | DRG: 470 | Disposition: A | Discharge status: Home Health | Payer: BC Managed Care – PPO | Attending: Orthopaedic Surgery | Admitting: Orthopaedic Surgery

## 2018-11-10 ENCOUNTER — Encounter (HOSPITAL_COMMUNITY): Payer: Self-pay | Admitting: Anesthesiology

## 2018-11-10 DIAGNOSIS — Z8249 Family history of ischemic heart disease and other diseases of the circulatory system: Secondary | ICD-10-CM

## 2018-11-10 DIAGNOSIS — Z7984 Long term (current) use of oral hypoglycemic drugs: Secondary | ICD-10-CM

## 2018-11-10 DIAGNOSIS — K219 Gastro-esophageal reflux disease without esophagitis: Secondary | ICD-10-CM | POA: Diagnosis not present

## 2018-11-10 DIAGNOSIS — Z833 Family history of diabetes mellitus: Secondary | ICD-10-CM | POA: Diagnosis not present

## 2018-11-10 DIAGNOSIS — Z6833 Body mass index (BMI) 33.0-33.9, adult: Secondary | ICD-10-CM

## 2018-11-10 DIAGNOSIS — E785 Hyperlipidemia, unspecified: Secondary | ICD-10-CM | POA: Diagnosis not present

## 2018-11-10 DIAGNOSIS — I10 Essential (primary) hypertension: Secondary | ICD-10-CM | POA: Diagnosis not present

## 2018-11-10 DIAGNOSIS — Z471 Aftercare following joint replacement surgery: Secondary | ICD-10-CM | POA: Diagnosis not present

## 2018-11-10 DIAGNOSIS — Z7982 Long term (current) use of aspirin: Secondary | ICD-10-CM | POA: Diagnosis not present

## 2018-11-10 DIAGNOSIS — D62 Acute posthemorrhagic anemia: Secondary | ICD-10-CM | POA: Diagnosis not present

## 2018-11-10 DIAGNOSIS — Z96642 Presence of left artificial hip joint: Secondary | ICD-10-CM | POA: Diagnosis not present

## 2018-11-10 DIAGNOSIS — Z841 Family history of disorders of kidney and ureter: Secondary | ICD-10-CM

## 2018-11-10 DIAGNOSIS — M1612 Unilateral primary osteoarthritis, left hip: Secondary | ICD-10-CM | POA: Diagnosis not present

## 2018-11-10 DIAGNOSIS — E669 Obesity, unspecified: Secondary | ICD-10-CM | POA: Diagnosis present

## 2018-11-10 DIAGNOSIS — Z96649 Presence of unspecified artificial hip joint: Secondary | ICD-10-CM

## 2018-11-10 DIAGNOSIS — Z419 Encounter for procedure for purposes other than remedying health state, unspecified: Secondary | ICD-10-CM

## 2018-11-10 DIAGNOSIS — E119 Type 2 diabetes mellitus without complications: Secondary | ICD-10-CM | POA: Diagnosis present

## 2018-11-10 HISTORY — PX: TOTAL HIP ARTHROPLASTY: SHX124

## 2018-11-10 LAB — GLUCOSE, CAPILLARY
Glucose-Capillary: 118 mg/dL — ABNORMAL HIGH (ref 70–99)
Glucose-Capillary: 107 mg/dL — ABNORMAL HIGH (ref 70–99)
Glucose-Capillary: 98 mg/dL (ref 70–99)
Glucose-Capillary: 185 mg/dL — ABNORMAL HIGH (ref 70–99)
Glucose-Capillary: 118 mg/dL — ABNORMAL HIGH (ref 70–99)
Glucose-Capillary: 107 mg/dL — ABNORMAL HIGH (ref 70–99)

## 2018-11-10 SURGERY — ARTHROPLASTY, HIP, TOTAL, ANTERIOR APPROACH
Anesthesia: Monitor Anesthesia Care | Site: Hip | Laterality: Left

## 2018-11-10 MED ORDER — METHOCARBAMOL 500 MG PO TABS
500.0000 mg | ORAL_TABLET | Freq: Four times a day (QID) | ORAL | Status: DC | PRN
  Administered 2018-11-10: 21:00:00 500 mg via ORAL
  Filled 2018-11-10 (×2): qty 1

## 2018-11-10 MED ORDER — ALPRAZOLAM 0.25 MG PO TABS: 0.1250 mg | ORAL_TABLET | Freq: Every evening | ORAL | Status: DC | PRN

## 2018-11-10 MED ORDER — KETOROLAC TROMETHAMINE 30 MG/ML IJ SOLN
INTRAMUSCULAR | Status: AC
  Filled 2018-11-10: qty 1

## 2018-11-10 MED ORDER — CHLORHEXIDINE GLUCONATE 4 % EX LIQD: 60.0000 mL | Freq: Once | CUTANEOUS | Status: DC

## 2018-11-10 MED ORDER — PHENOL 1.4 % MT LIQD: 1.0000 | OROMUCOSAL | Status: DC | PRN

## 2018-11-10 MED ORDER — ACETAMINOPHEN 500 MG PO TABS
1000.0000 mg | ORAL_TABLET | Freq: Four times a day (QID) | ORAL | Status: AC
  Administered 2018-11-10 – 2018-11-11 (×4): 1000 mg via ORAL
  Filled 2018-11-10 (×4): qty 2

## 2018-11-10 MED ORDER — ACETAMINOPHEN 325 MG PO TABS: 325.0000 mg | ORAL_TABLET | Freq: Four times a day (QID) | ORAL | Status: DC | PRN

## 2018-11-10 MED ORDER — MENTHOL 3 MG MT LOZG: 1.0000 | LOZENGE | OROMUCOSAL | Status: DC | PRN

## 2018-11-10 MED ORDER — HYDROMORPHONE HCL 1 MG/ML IJ SOLN
0.5000 mg | INTRAMUSCULAR | Status: DC | PRN
  Administered 2018-11-10: 1 mg via INTRAVENOUS
  Filled 2018-11-10: qty 1

## 2018-11-10 MED ORDER — OXYCODONE HCL 5 MG PO TABS: 10.0000 mg | ORAL_TABLET | ORAL | Status: DC | PRN

## 2018-11-10 MED ORDER — METHOCARBAMOL 1000 MG/10ML IJ SOLN
500.0000 mg | Freq: Four times a day (QID) | INTRAVENOUS | Status: DC | PRN
  Filled 2018-11-10: qty 5

## 2018-11-10 MED ORDER — OXYCODONE HCL 5 MG PO TABS
5.0000 mg | ORAL_TABLET | ORAL | Status: DC | PRN
  Filled 2018-11-10: qty 2

## 2018-11-10 MED ORDER — OXYCODONE HCL ER 10 MG PO T12A: 10.0000 mg | EXTENDED_RELEASE_TABLET | Freq: Two times a day (BID) | ORAL | 0 refills | Status: AC

## 2018-11-10 MED ORDER — ONDANSETRON HCL 4 MG PO TABS: 4.0000 mg | ORAL_TABLET | Freq: Four times a day (QID) | ORAL | Status: DC | PRN

## 2018-11-10 MED ORDER — OXYCODONE HCL 5 MG/5ML PO SOLN: 5.0000 mg | Freq: Once | ORAL | Status: AC | PRN

## 2018-11-10 MED ORDER — OXYCODONE HCL 5 MG PO TABS
ORAL_TABLET | ORAL | Status: AC
  Filled 2018-11-10: qty 1

## 2018-11-10 MED ORDER — POTASSIUM 99 MG PO TABS: 99.0000 mg | ORAL_TABLET | ORAL | Status: DC

## 2018-11-10 MED ORDER — MAGNESIUM CITRATE PO SOLN: 1.0000 | Freq: Once | ORAL | Status: DC | PRN

## 2018-11-10 MED ORDER — CEFAZOLIN SODIUM-DEXTROSE 2-4 GM/100ML-% IV SOLN
2.0000 g | Freq: Four times a day (QID) | INTRAVENOUS | Status: AC
  Administered 2018-11-10 – 2018-11-11 (×2): 2 g via INTRAVENOUS
  Filled 2018-11-10 (×3): qty 100

## 2018-11-10 MED ORDER — PROMETHAZINE HCL 25 MG/ML IJ SOLN: 6.2500 mg | INTRAMUSCULAR | Status: DC | PRN

## 2018-11-10 MED ORDER — OXYCODONE HCL 5 MG PO TABS: 5.0000 mg | ORAL_TABLET | Freq: Three times a day (TID) | ORAL | 0 refills | Status: DC | PRN

## 2018-11-10 MED ORDER — MIDAZOLAM HCL 5 MG/5ML IJ SOLN
INTRAMUSCULAR | Status: DC | PRN
  Administered 2018-11-10 (×2): 1 mg via INTRAVENOUS

## 2018-11-10 MED ORDER — CEFAZOLIN SODIUM-DEXTROSE 2-4 GM/100ML-% IV SOLN
INTRAVENOUS | Status: AC
  Administered 2018-11-10: 22:00:00 2000 mg
  Filled 2018-11-10: qty 100

## 2018-11-10 MED ORDER — POVIDONE-IODINE 10 % EX SWAB: 2.0000 "application " | Freq: Once | CUTANEOUS | Status: DC

## 2018-11-10 MED ORDER — DEXAMETHASONE SODIUM PHOSPHATE 10 MG/ML IJ SOLN
10.0000 mg | Freq: Once | INTRAMUSCULAR | Status: AC
  Administered 2018-11-11: 10:00:00 10 mg via INTRAVENOUS
  Filled 2018-11-10: qty 1

## 2018-11-10 MED ORDER — LACTATED RINGERS IV SOLN
INTRAVENOUS | Status: DC
  Administered 2018-11-10: 09:00:00 via INTRAVENOUS

## 2018-11-10 MED ORDER — KETOROLAC TROMETHAMINE 15 MG/ML IJ SOLN
INTRAMUSCULAR | Status: AC
  Filled 2018-11-10: qty 1

## 2018-11-10 MED ORDER — HYDROCHLOROTHIAZIDE 25 MG PO TABS
25.0000 mg | ORAL_TABLET | Freq: Every day | ORAL | Status: DC
  Administered 2018-11-10 – 2018-11-12 (×3): 25 mg via ORAL
  Filled 2018-11-10 (×3): qty 1

## 2018-11-10 MED ORDER — ONDANSETRON HCL 4 MG PO TABS: 4.0000 mg | ORAL_TABLET | Freq: Three times a day (TID) | ORAL | 0 refills | Status: DC | PRN

## 2018-11-10 MED ORDER — SODIUM CHLORIDE 0.9 % IV SOLN
INTRAVENOUS | Status: DC
  Administered 2018-11-10 – 2018-11-11 (×2): via INTRAVENOUS

## 2018-11-10 MED ORDER — FENTANYL CITRATE (PF) 100 MCG/2ML IJ SOLN
25.0000 ug | INTRAMUSCULAR | Status: DC | PRN
  Administered 2018-11-10: 50 ug via INTRAVENOUS
  Administered 2018-11-10: 25 ug via INTRAVENOUS
  Administered 2018-11-10: 14:00:00 50 ug via INTRAVENOUS

## 2018-11-10 MED ORDER — TRANEXAMIC ACID-NACL 1000-0.7 MG/100ML-% IV SOLN
1000.0000 mg | Freq: Once | INTRAVENOUS | Status: AC
  Administered 2018-11-11: 1000 mg via INTRAVENOUS
  Filled 2018-11-10: qty 100

## 2018-11-10 MED ORDER — METOCLOPRAMIDE HCL 5 MG PO TABS: 5.0000 mg | ORAL_TABLET | Freq: Three times a day (TID) | ORAL | Status: DC | PRN

## 2018-11-10 MED ORDER — FENTANYL CITRATE (PF) 100 MCG/2ML IJ SOLN
INTRAMUSCULAR | Status: DC | PRN
  Administered 2018-11-10 (×4): 50 ug via INTRAVENOUS

## 2018-11-10 MED ORDER — DIPHENHYDRAMINE HCL 12.5 MG/5ML PO ELIX: 25.0000 mg | ORAL_SOLUTION | ORAL | Status: DC | PRN

## 2018-11-10 MED ORDER — LACTATED RINGERS IV SOLN: INTRAVENOUS | Status: DC

## 2018-11-10 MED ORDER — SORBITOL 70 % SOLN: 30.0000 mL | Freq: Every day | Status: DC | PRN

## 2018-11-10 MED ORDER — VANCOMYCIN HCL 1 G IV SOLR
INTRAVENOUS | Status: DC | PRN
  Administered 2018-11-10: 1000 mg

## 2018-11-10 MED ORDER — ASPIRIN 81 MG PO CHEW
81.0000 mg | CHEWABLE_TABLET | Freq: Two times a day (BID) | ORAL | Status: DC
  Administered 2018-11-10 – 2018-11-12 (×4): 81 mg via ORAL
  Filled 2018-11-10 (×4): qty 1

## 2018-11-10 MED ORDER — METHOCARBAMOL 750 MG PO TABS: 750.0000 mg | ORAL_TABLET | Freq: Two times a day (BID) | ORAL | 0 refills | Status: DC | PRN

## 2018-11-10 MED ORDER — SENNOSIDES-DOCUSATE SODIUM 8.6-50 MG PO TABS: 1.0000 | ORAL_TABLET | Freq: Every evening | ORAL | 1 refills | Status: DC | PRN

## 2018-11-10 MED ORDER — DOCUSATE SODIUM 100 MG PO CAPS
100.0000 mg | ORAL_CAPSULE | Freq: Two times a day (BID) | ORAL | Status: DC
  Administered 2018-11-10 – 2018-11-12 (×4): 100 mg via ORAL
  Filled 2018-11-10 (×4): qty 1

## 2018-11-10 MED ORDER — KETOROLAC TROMETHAMINE 15 MG/ML IJ SOLN
30.0000 mg | Freq: Four times a day (QID) | INTRAMUSCULAR | Status: AC
  Administered 2018-11-10 – 2018-11-11 (×3): 30 mg via INTRAVENOUS
  Filled 2018-11-10 (×2): qty 2

## 2018-11-10 MED ORDER — FENTANYL CITRATE (PF) 100 MCG/2ML IJ SOLN
INTRAMUSCULAR | Status: AC
  Filled 2018-11-10: qty 2

## 2018-11-10 MED ORDER — MIDAZOLAM HCL 2 MG/2ML IJ SOLN
INTRAMUSCULAR | Status: AC
  Filled 2018-11-10: qty 2

## 2018-11-10 MED ORDER — LORATADINE 10 MG PO TABS: 10.0000 mg | ORAL_TABLET | Freq: Every day | ORAL | Status: DC | PRN

## 2018-11-10 MED ORDER — PROPOFOL 500 MG/50ML IV EMUL
INTRAVENOUS | Status: DC | PRN
  Administered 2018-11-10: 50 ug/kg/min via INTRAVENOUS

## 2018-11-10 MED ORDER — METOCLOPRAMIDE HCL 5 MG/ML IJ SOLN: 5.0000 mg | Freq: Three times a day (TID) | INTRAMUSCULAR | Status: DC | PRN

## 2018-11-10 MED ORDER — ASPIRIN EC 81 MG PO TBEC: 81.0000 mg | DELAYED_RELEASE_TABLET | Freq: Two times a day (BID) | ORAL | 0 refills | Status: DC

## 2018-11-10 MED ORDER — ONDANSETRON HCL 4 MG/2ML IJ SOLN
INTRAMUSCULAR | Status: DC | PRN
  Administered 2018-11-10: 4 mg via INTRAVENOUS

## 2018-11-10 MED ORDER — ONDANSETRON HCL 4 MG/2ML IJ SOLN
4.0000 mg | Freq: Four times a day (QID) | INTRAMUSCULAR | Status: DC | PRN
  Administered 2018-11-10 – 2018-11-11 (×2): 4 mg via INTRAVENOUS
  Filled 2018-11-10 (×2): qty 2

## 2018-11-10 MED ORDER — 0.9 % SODIUM CHLORIDE (POUR BTL) OPTIME
TOPICAL | Status: DC | PRN
  Administered 2018-11-10: 11:00:00 1000 mL

## 2018-11-10 MED ORDER — POLYETHYLENE GLYCOL 3350 17 G PO PACK: 17.0000 g | PACK | Freq: Every day | ORAL | Status: DC | PRN

## 2018-11-10 MED ORDER — SODIUM CHLORIDE 0.9 % IR SOLN
Status: DC | PRN
  Administered 2018-11-10: 3000 mL

## 2018-11-10 MED ORDER — FENTANYL CITRATE (PF) 250 MCG/5ML IJ SOLN
INTRAMUSCULAR | Status: AC
  Filled 2018-11-10: qty 5

## 2018-11-10 MED ORDER — VANCOMYCIN HCL 1000 MG IV SOLR
INTRAVENOUS | Status: AC
  Filled 2018-11-10: qty 1000

## 2018-11-10 MED ORDER — ALUM & MAG HYDROXIDE-SIMETH 200-200-20 MG/5ML PO SUSP: 30.0000 mL | ORAL | Status: DC | PRN

## 2018-11-10 MED ORDER — INSULIN ASPART 100 UNIT/ML ~~LOC~~ SOLN
0.0000 [IU] | Freq: Every day | SUBCUTANEOUS | Status: DC
  Administered 2018-11-11: 22:00:00 2 [IU] via SUBCUTANEOUS

## 2018-11-10 MED ORDER — PROMETHAZINE HCL 25 MG PO TABS: 25.0000 mg | ORAL_TABLET | Freq: Four times a day (QID) | ORAL | 1 refills | Status: DC | PRN

## 2018-11-10 MED ORDER — OXYCODONE HCL 5 MG PO TABS
5.0000 mg | ORAL_TABLET | Freq: Once | ORAL | Status: AC | PRN
  Administered 2018-11-10: 14:00:00 5 mg via ORAL

## 2018-11-10 MED ORDER — OXYCODONE HCL ER 10 MG PO T12A
10.0000 mg | EXTENDED_RELEASE_TABLET | Freq: Two times a day (BID) | ORAL | Status: DC
  Administered 2018-11-10 – 2018-11-12 (×4): 10 mg via ORAL
  Filled 2018-11-10 (×4): qty 1

## 2018-11-10 MED ORDER — GABAPENTIN 300 MG PO CAPS
300.0000 mg | ORAL_CAPSULE | Freq: Three times a day (TID) | ORAL | Status: DC
  Administered 2018-11-10 – 2018-11-12 (×5): 300 mg via ORAL
  Filled 2018-11-10 (×5): qty 1

## 2018-11-10 MED ORDER — BUPIVACAINE-EPINEPHRINE (PF) 0.25% -1:200000 IJ SOLN
INTRAMUSCULAR | Status: AC
  Filled 2018-11-10: qty 30

## 2018-11-10 MED ORDER — INSULIN ASPART 100 UNIT/ML ~~LOC~~ SOLN
0.0000 [IU] | Freq: Three times a day (TID) | SUBCUTANEOUS | Status: DC
  Administered 2018-11-11: 3 [IU] via SUBCUTANEOUS
  Administered 2018-11-11: 08:00:00 2 [IU] via SUBCUTANEOUS
  Administered 2018-11-11: 11 [IU] via SUBCUTANEOUS
  Administered 2018-11-12: 10:00:00 2 [IU] via SUBCUTANEOUS

## 2018-11-10 MED ORDER — CEFAZOLIN SODIUM-DEXTROSE 2-4 GM/100ML-% IV SOLN
2.0000 g | INTRAVENOUS | Status: AC
  Administered 2018-11-10: 2 g via INTRAVENOUS
  Filled 2018-11-10: qty 100

## 2018-11-10 MED ORDER — PRAVASTATIN SODIUM 40 MG PO TABS
40.0000 mg | ORAL_TABLET | Freq: Every day | ORAL | Status: DC
  Administered 2018-11-10 – 2018-11-12 (×3): 40 mg via ORAL
  Filled 2018-11-10 (×3): qty 1

## 2018-11-10 MED ORDER — CEPHALEXIN 500 MG PO CAPS: 500.0000 mg | ORAL_CAPSULE | Freq: Four times a day (QID) | ORAL | 0 refills | Status: DC

## 2018-11-10 MED ORDER — PANTOPRAZOLE SODIUM 40 MG PO TBEC
80.0000 mg | DELAYED_RELEASE_TABLET | Freq: Every day | ORAL | Status: DC
  Administered 2018-11-11: 80 mg via ORAL
  Filled 2018-11-10: qty 2

## 2018-11-10 MED ORDER — BUPIVACAINE IN DEXTROSE 0.75-8.25 % IT SOLN
INTRATHECAL | Status: DC | PRN
  Administered 2018-11-10: 2 mL via INTRATHECAL

## 2018-11-10 SURGICAL SUPPLY — 56 items
ACETAB CUP W/GRIPTION 54 (Plate) ×2 IMPLANT
BAG DECANTER FOR FLEXI CONT (MISCELLANEOUS) ×2 IMPLANT
CELLS DAT CNTRL 66122 CELL SVR (MISCELLANEOUS) ×1 IMPLANT
COVER PERINEAL POST (MISCELLANEOUS) ×2 IMPLANT
COVER SURGICAL LIGHT HANDLE (MISCELLANEOUS) ×2 IMPLANT
COVER WAND RF STERILE (DRAPES) ×2 IMPLANT
CUP ACETAB W/GRIPTION 54 (Plate) ×1 IMPLANT
DRAPE C-ARM 42X72 X-RAY (DRAPES) ×2 IMPLANT
DRAPE POUCH INSTRU U-SHP 10X18 (DRAPES) ×2 IMPLANT
DRAPE STERI IOBAN 125X83 (DRAPES) ×2 IMPLANT
DRAPE U-SHAPE 47X51 STRL (DRAPES) ×4 IMPLANT
DRSG AQUACEL AG ADV 3.5X10 (GAUZE/BANDAGES/DRESSINGS) ×2 IMPLANT
DURAPREP 26ML APPLICATOR (WOUND CARE) ×4 IMPLANT
ELECT BLADE 4.0 EZ CLEAN MEGAD (MISCELLANEOUS) ×2
ELECT REM PT RETURN 9FT ADLT (ELECTROSURGICAL) ×2
ELECTRODE BLDE 4.0 EZ CLN MEGD (MISCELLANEOUS) ×1 IMPLANT
ELECTRODE REM PT RTRN 9FT ADLT (ELECTROSURGICAL) ×1 IMPLANT
GLOVE BIOGEL PI IND STRL 7.0 (GLOVE) ×1 IMPLANT
GLOVE BIOGEL PI INDICATOR 7.0 (GLOVE) ×1
GLOVE ECLIPSE 7.0 STRL STRAW (GLOVE) ×6 IMPLANT
GLOVE SKINSENSE NS SZ7.5 (GLOVE) ×1
GLOVE SKINSENSE STRL SZ7.5 (GLOVE) ×1 IMPLANT
GLOVE SURG SYN 7.5  E (GLOVE) ×4
GLOVE SURG SYN 7.5 E (GLOVE) ×4 IMPLANT
GOWN SRG XL XLNG 56XLVL 4 (GOWN DISPOSABLE) ×1 IMPLANT
GOWN STRL NON-REIN XL XLG LVL4 (GOWN DISPOSABLE) ×1
GOWN STRL REUS W/ TWL LRG LVL3 (GOWN DISPOSABLE) IMPLANT
GOWN STRL REUS W/ TWL XL LVL3 (GOWN DISPOSABLE) ×1 IMPLANT
GOWN STRL REUS W/TWL LRG LVL3 (GOWN DISPOSABLE)
GOWN STRL REUS W/TWL XL LVL3 (GOWN DISPOSABLE) ×1
HANDPIECE INTERPULSE COAX TIP (DISPOSABLE) ×1
HEAD CERAMIC 36 PLUS5 (Hips) ×2 IMPLANT
HOOD PEEL AWAY FLYTE STAYCOOL (MISCELLANEOUS) ×4 IMPLANT
IV NS IRRIG 3000ML ARTHROMATIC (IV SOLUTION) ×2 IMPLANT
KIT BASIN OR (CUSTOM PROCEDURE TRAY) ×2 IMPLANT
LINER NEUTRAL 36ID 54OD (Liner) ×2 IMPLANT
MARKER SKIN DUAL TIP RULER LAB (MISCELLANEOUS) ×2 IMPLANT
NEEDLE SPNL 18GX3.5 QUINCKE PK (NEEDLE) ×2 IMPLANT
PACK TOTAL JOINT (CUSTOM PROCEDURE TRAY) ×2 IMPLANT
PACK UNIVERSAL I (CUSTOM PROCEDURE TRAY) ×2 IMPLANT
RTRCTR WOUND ALEXIS 18CM MED (MISCELLANEOUS) ×2
SAW OSC TIP CART 19.5X105X1.3 (SAW) ×2 IMPLANT
SCREW 6.5MMX25MM (Screw) ×2 IMPLANT
SET HNDPC FAN SPRY TIP SCT (DISPOSABLE) ×1 IMPLANT
STAPLER VISISTAT 35W (STAPLE) IMPLANT
STEM FEM ACTIS STD SZ2 (Stem) ×2 IMPLANT
SUT ETHIBOND 2 V 37 (SUTURE) ×2 IMPLANT
SUT MNCRL AB 4-0 PS2 18 (SUTURE) ×2 IMPLANT
SUT VIC AB 1 CT1 27 (SUTURE) ×1
SUT VIC AB 1 CT1 27XBRD ANBCTR (SUTURE) ×1 IMPLANT
SUT VIC AB 2-0 CT1 27 (SUTURE) ×2
SUT VIC AB 2-0 CT1 TAPERPNT 27 (SUTURE) ×2 IMPLANT
SYRINGE 60CC LL (MISCELLANEOUS) ×2 IMPLANT
TOWEL OR 17X26 10 PK STRL BLUE (TOWEL DISPOSABLE) ×2 IMPLANT
TRAY CATH 16FR W/PLASTIC CATH (SET/KITS/TRAYS/PACK) IMPLANT
YANKAUER SUCT BULB TIP NO VENT (SUCTIONS) ×2 IMPLANT

## 2018-11-10 NOTE — H&P (Signed)
PREOPERATIVE H&P  Chief Complaint: osteoarthritis left hip  HPI: Angelica Sharp is a 53 y.o. female who presents for surgical treatment of osteoarthritis left hip.  She denies any changes in medical history.  Past Medical History:  Diagnosis Date  . Allergy   . Anemia   . Anxiety   . Arthritis   . Borderline hypertension   . Chest pain   . Complication of anesthesia   . Degenerative lumbar disc    L5S1  . Diabetes mellitus without complication ([place])   . Diverticulitis   . Dyspnea   . Edema    lower extremity  . Family history of anesthesia complication    sister had BP complications during anes.  . Fatty liver   . GERD (gastroesophageal reflux disease)   . H/O hiatal hernia   . Headache   . Hypertension   . Pneumonia   . PONV (postoperative nausea and vomiting)   . SOB (shortness of breath)    Past Surgical History:  Procedure Laterality Date  . ABDOMINAL HYSTERECTOMY    . BREAST LUMPECTOMY     3 lumps -- two from right -- one from left  . CESAREAN SECTION    . LAPAROSCOPIC ASSISTED VAGINAL HYSTERECTOMY N/A 09/18/2012   Procedure: LAPAROSCOPIC ASSISTED VAGINAL HYSTERECTOMY;  Surgeon: Linda Hedges, DO;  Location: [facility];  Service: Gynecology;  Laterality: N/A;  BLADDER  DISTENTION  . TUBAL LIGATION     Social History   Socioeconomic History  . Marital status: Divorced    Spouse name: Not on file  . Number of children: 3  . Years of education: Not on file  . Highest education level: Not on file  Occupational History  . 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 Financial resource strain: Not on file  . Food insecurity:    Worry: Not on file    Inability: Not on file  . Transportation needs:    Medical: Not on file    Non-medical: Not on file  Tobacco Use  . Smoking status: Never Smoker  . Smokeless tobacco: Never Used  Substance and Sexual Activity  . Alcohol use: No   . Drug use: No  . Sexual activity: Not Currently  Lifestyle  . Physical activity:    Days per week: Not on file    Minutes per session: Not on file  . Stress: Not on file  Relationships  . Social connections:    Talks on phone: Not on file    Gets together: Not on file    Attends religious service: Not on file    Active member of club or organization: Not on file    Attends meetings of clubs or organizations: Not on file    Relationship status: Not on file  Other Topics Concern  . Not on file  Social History Narrative   Lives with daughter and grandkids.   Family History  Problem Relation Age of Onset  . Heart attack Mother   . Diabetes Mother   . Hyperlipidemia Mother   . Hypertension Mother   . Heart attack Sister   . Pulmonary embolism Father   . Clotting disorder Father   . Diabetes Sister   . Kidney disease Sister        kidney failure  . Heart disease Sister   . Cancer Sister   . Diabetes Son 43  TYPE 1   Allergies  Allergen Reactions  . Morphine And Related Other (See Comments)    Headache and hallucinations  . Septra [Sulfamethoxazole-Trimethoprim] Other (See Comments)    TACHYCARDIA   Prior to Admission medications   Medication Sig Start Date End Date Taking? Authorizing Provider  acetaminophen (TYLENOL) 500 MG tablet Take 1,000 mg by mouth 2 (two) times daily as needed for moderate pain or headache.   Yes [provider]  aspirin EC 81 MG tablet Take 81 mg by mouth daily.   Yes [provider]  Calcium Carb-Cholecalciferol (CALCIUM+D3 PO) Take 1 tablet by mouth daily.   Yes [provider]  esomeprazole (NEXIUM) 40 MG capsule Take 1 capsule (40 mg total) by mouth daily. 08/29/18  Yes Wendie Agreste, MD  hydrochlorothiazide (HYDRODIURIL) 25 MG tablet Take 1 tablet (25 mg total) by mouth daily. 08/29/18  Yes Wendie Agreste, MD  ibuprofen (ADVIL) 200 MG tablet Take 800 mg by mouth 2 (two) times daily as needed for headache  or moderate pain.   Yes [provider]  Liniments (SALONPAS PAIN RELIEF PATCH EX) Place 1 patch onto the skin daily as needed (pain.).   Yes [provider]  loratadine (CLARITIN) 10 MG tablet Take 10 mg by mouth daily as needed for allergies.   Yes [provider]  metFORMIN (GLUCOPHAGE-XR) 750 MG 24 hr tablet Take 750 mg by mouth 2 (two) times daily.  12/17/16  Yes [provider]  methocarbamol (ROBAXIN) 500 MG tablet Take 1 tablet (500 mg total) by mouth 2 (two) times daily. Patient taking differently: Take 500 mg by mouth 2 (two) times daily as needed (muscle spasms).  06/30/18  Yes Kendrick, Caitlyn S, PA-C  Phenazopyridine HCl (AZO URINARY PAIN PO) Take 2 tablets by mouth daily as needed (urinary discomfort).   Yes [provider]  Phenylephrine-Acetaminophen (TYLENOL SINUS+HEADACHE PO) Take 2 tablets by mouth daily as needed (allergies).   Yes [provider]  polyethylene glycol (MIRALAX / GLYCOLAX) packet Take 17 g by mouth daily as needed for mild constipation.    Yes [provider]  Potassium 99 MG TABS Take 99-198 mg by mouth See admin instructions. Take 198 mg in the morning and 99 mg at night   Yes [provider]  pravastatin (PRAVACHOL) 40 MG tablet Take 1 tablet (40 mg total) by mouth daily. Patient taking differently: Take 40 mg by mouth at bedtime.  08/29/18  Yes Wendie Agreste, MD  sodium chloride (OCEAN) 0.65 % SOLN nasal spray Place 1 spray into both nostrils as needed for congestion.   Yes [provider]  ALPRAZolam (XANAX) 0.25 MG tablet Take 0.5-1 tablets (0.125-0.25 mg total) by mouth at bedtime as needed for anxiety. Patient taking differently: Take 0.125-0.25 mg by mouth at bedtime as needed for anxiety or sleep.  05/02/18   Wendie Agreste, MD  Aspirin-Caffeine (BAYER BACK & BODY) 500-32.5 MG TABS Take 2 tablets by mouth 2 (two) times daily as needed (pain).    [provider]   doxycycline (VIBRA-TABS) 100 MG tablet Take 1 tablet (100 mg total) by mouth 2 (two) times daily. Patient not taking: Reported on 10/28/2018 06/18/18   McVey, Gelene Mink, PA-C  glucose blood test strip Use as instructed 05/10/15   Harrison Mons, PA  loperamide (IMODIUM A-D) 2 MG tablet Take 4 mg by mouth 4 (four) times daily as needed for diarrhea or loose stools.    [provider]  ondansetron (ZOFRAN-ODT) 4 MG disintegrating tablet Take 4 mg by mouth every 8 (eight) hours as needed for nausea or vomiting.    [provider]  Tetrahydrozoline HCl (VISINE OP) Place 1 drop into both eyes daily as needed (dry eyes).    [provider]  calcium carbonate 200 MG capsule TWICE WEEKLY  08/29/11  [provider]  ranitidine (ZANTAC) 150 MG capsule Take 150 mg by mouth every evening.  08/29/11  [provider]     Positive ROS: All other systems have been reviewed and were otherwise negative with the exception of those mentioned in the HPI and as above.  Physical Exam: General: Alert, no acute distress Cardiovascular: No pedal edema Respiratory: No cyanosis, no use of accessory musculature GI: abdomen soft Skin: No lesions in the area of chief complaint Neurologic: Sensation intact distally Psychiatric: Patient is competent for consent with normal mood and affect Lymphatic: no lymphedema  MUSCULOSKELETAL: exam stable  Assessment: osteoarthritis left hip  Plan: Plan for Procedure(s): LEFT TOTAL HIP ARTHROPLASTY ANTERIOR APPROACH  The risks benefits and alternatives were discussed with the patient including but not limited to the risks of nonoperative treatment, versus surgical intervention including infection, bleeding, nerve injury,  blood clots, cardiopulmonary complications, morbidity, mortality, among others, and they were willing to proceed.   Preoperative templating of the joint replacement has been completed, documented, and submitted  to the Operating Room personnel in order to optimize intra-operative equipment management.  Eduard Roux, MD   11/10/2018 9:25 AM

## 2018-11-10 NOTE — Anesthesia Preprocedure Evaluation (Addendum)
Anesthesia Evaluation  Patient identified by MRN, date of birth, ID band Patient awake    Reviewed: Allergy & Precautions, NPO status , Patient's Chart, lab work & pertinent test results  History of Anesthesia Complications (+) PONV and history of anesthetic complications  Airway Mallampati: II  TM Distance: >3 FB Neck ROM: Full    Dental  (+) Dental Advisory Given, Teeth Intact   Pulmonary neg pulmonary ROS,    breath sounds clear to auscultation       Cardiovascular hypertension, Pt. on medications  Rhythm:Regular Rate:Normal     Neuro/Psych  Headaches, PSYCHIATRIC DISORDERS Anxiety    GI/Hepatic Neg liver ROS, hiatal hernia, GERD  Medicated and Controlled,  Endo/Other  diabetes, Type 2, Oral Hypoglycemic Agents Obesity   Renal/GU negative Renal ROS     Musculoskeletal  (+) Arthritis ,   Abdominal   Peds  Hematology  (+) anemia ,   Anesthesia Other Findings   Reproductive/Obstetrics  S/p hysterectomy                             Anesthesia Physical Anesthesia Plan  ASA: II  Anesthesia Plan: Spinal   Post-op Pain Management:    Induction:   PONV Risk Score and Plan: 3 and Treatment may vary due to age or medical condition and Propofol infusion  Airway Management Planned: Natural Airway and Simple Face Mask  Additional Equipment: None  Intra-op Plan:   Post-operative Plan:   Informed Consent: I have reviewed the patients History and Physical, chart, labs and discussed the procedure including the risks, benefits and alternatives for the proposed anesthesia with the patient or authorized representative who has indicated his/her understanding and acceptance.       Plan Discussed with: CRNA and Anesthesiologist  Anesthesia Plan Comments: (Labs reviewed, platelets acceptable. Discussed risks and benefits of spinal, including spinal/epidural hematoma, infection, failed  block, and PDPH. Patient expressed understanding and wished to proceed. )       Anesthesia Quick Evaluation

## 2018-11-10 NOTE — Anesthesia Procedure Notes (Signed)
Spinal  Patient location during procedure: OR Start time: 11/10/2018 10:52 AM End time: 11/10/2018 10:58 AM Staffing Anesthesiologist: Oleta Mouse, MD Performed: anesthesiologist  Preanesthetic Checklist Completed: patient identified, site marked, surgical consent, pre-op evaluation, timeout performed, IV checked, risks and benefits discussed and monitors and equipment checked Spinal Block Patient position: sitting Prep: DuraPrep Patient monitoring: heart rate, cardiac monitor, continuous pulse ox and blood pressure Approach: midline Location: L3-4 Injection technique: single-shot Needle Needle type: Sprotte  Needle gauge: 24 G Needle length: 9 cm Assessment Sensory level: T4

## 2018-11-10 NOTE — Transfer of Care (Signed)
Immediate Anesthesia Transfer of Care Note  Patient: Angelica Sharp  Procedure(s) Performed: LEFT TOTAL HIP ARTHROPLASTY ANTERIOR APPROACH (Left Hip)  Patient Location: PACU  Anesthesia Type:Regional and Spinal  Level of Consciousness: awake, alert , oriented and sedated  Airway & Oxygen Therapy: Patient Spontanous Breathing and Patient connected to nasal cannula oxygen  Post-op Assessment: Report given to RN, Post -op Vital signs reviewed and stable and Patient moving all extremities  Post vital signs: Reviewed and stable  Last Vitals:  Vitals Value Taken Time  BP 99/69 11/10/2018  1:08 PM  Temp    Pulse 67 11/10/2018  1:10 PM  Resp 12 11/10/2018  1:10 PM  SpO2 100 % 11/10/2018  1:10 PM  Vitals shown include unvalidated device data.  Last Pain:  Vitals:   11/10/18 0900  PainSc: 2       Patients Stated Pain Goal: 1 (62/82/41 7530)  Complications: No apparent anesthesia complications

## 2018-11-10 NOTE — Discharge Instructions (Signed)

## 2018-11-10 NOTE — Progress Notes (Signed)
PHARMACIST - PHYSICIAN ORDER COMMUNICATION  CONCERNING: P&T Medication Policy on Herbal Medications  DESCRIPTION:  This patient's order for:  Potassium 99 mg   has been noted.  This product(s) is classified as an "herbal" or natural product. Due to a lack of definitive safety studies or FDA approval, nonstandard manufacturing practices, plus the potential risk of unknown drug-drug interactions while on inpatient medications, the Pharmacy and Therapeutics Committee does not permit the use of "herbal" or natural products of this type within [facility].   ACTION TAKEN: The pharmacy department is unable to verify this order at this time and your patient has been informed of this safety policy. Please reevaluate patient's clinical condition at discharge and address if the herbal or natural product(s) should be resumed at that time.   

## 2018-11-10 NOTE — Op Note (Addendum)
LEFT TOTAL HIP ARTHROPLASTY ANTERIOR APPROACH  Procedure Note Klarissa Mcilvain   [id number]  Pre-op Diagnosis: osteoarthritis left hip     Post-op Diagnosis: same   Operative Procedures  1. Total hip replacement; Left hip; uncemented [id number]   Personnel  Surgeon(s): Leandrew Koyanagi, MD  Assist: Madalyn Rob, PA-C; necessary for the timely completion of procedure and due to complexity of procedure.   Anesthesia: spinal  Prosthesis: Depuy Acetabulum: Pinnacle 54 mm Femur: Actis STD 2 Head: 36 mm size: +5 Liner: +0 neutral Bearing Type: ceramic on poly  Total Hip Arthroplasty (Anterior Approach) Op Note:  After informed consent was obtained and the operative extremity marked in the holding area, the patient was brought back to the operating room and placed supine on the HANA table. Next, the operative extremity was prepped and draped in normal sterile fashion. Surgical timeout occurred verifying patient identification, surgical site, surgical procedure and administration of antibiotics.  A modified anterior Smith-Peterson approach to the hip was performed, using the interval between tensor fascia lata and sartorius.  Dissection was carried bluntly down onto the anterior hip capsule. The lateral femoral circumflex vessels were identified and coagulated. A capsulotomy was performed and the capsular flaps tagged for later repair.  Fluoroscopy was utilized to prepare for the femoral neck cut. The neck osteotomy was performed. The femoral head was removed which was exhibited widespread full thickness cartilage loss, the acetabular rim was cleared of soft tissue and attention was turned to reaming the acetabulum.  Sequential reaming was performed under fluoroscopic guidance. We reamed to a size 53 mm, and then impacted the acetabular shell. The liner was then placed after irrigation and attention turned to the femur.  After placing the femoral hook, the leg was taken to externally  rotated, extended and adducted position taking care to perform soft tissue releases to allow for adequate mobilization of the femur. Soft tissue was cleared from the shoulder of the greater trochanter and the hook elevator used to improve exposure of the proximal femur. Sequential broaching performed up to a size 2. Trial neck and head were placed. The leg was brought back up to neutral and the construct reduced. The position and sizing of components, offset and leg lengths were checked using fluoroscopy. Stability of the  construct was checked in extension and external rotation to 90 degrees without any subluxation or impingement of prosthesis. We dislocated the prosthesis, dropped the leg back into position, removed trial components, and irrigated copiously. The final stem and head was then placed, the leg brought back up, the system reduced and fluoroscopy used to verify positioning.  We irrigated, obtained hemostasis and closed the capsule using #2 ethibond suture.  One gram of vancomycin powder was placed in the surgical bed. The fascia was closed with #1 vicryl plus, the deep fat layer was closed with 0 vicryl, the subcutaneous layers closed with 2.0 Vicryl Plus and the skin closed with 4.0 monocryl and steri strips. A sterile dressing was applied. The patient was awakened in the operating room and taken to recovery in stable condition.  All sponge, needle, and instrument counts were correct at the end of the case.   Position: supine  Complications: none.  Time Out: performed   Drains/Packing: none  Estimated blood loss: see anesthesia record  Returned to Recovery Room: in good condition.   Antibiotics: yes   Mechanical VTE (DVT) Prophylaxis: sequential compression devices, TED thigh-high  Chemical VTE (DVT) Prophylaxis: aspirin   Fluid Replacement: see  anesthesia record  Specimens Removed: 1 to pathology   Sponge and Instrument Count Correct? yes   PACU: portable radiograph - low AP    Plan/RTC: Return in 2 weeks for staple removal. Weight Bearing/Load Lower Extremity: full  Hip precautions: none Suture Removal: 2 weeks   N. Eduard Roux, MD [place] 12:30 PM   Implant Name Type Inv. Item Serial No. Manufacturer Lot No. LRB No. Used  ACETABULAR CUP Daisy Blossom 54MM - [id number] Plate ACETABULAR CUP W GRIPTION 54MM  DEPUY SYNTHES [id number] Left 1  LINER NEUTRAL 36ID 54OD - [id number] Liner LINER NEUTRAL 36ID 54OD  DEPUY SYNTHES [id number] Left 1  SCREW 6.5MMX25MM - [id number] Screw SCREW 6.5MMX25MM  DEPUY SYNTHES [id number] Left 1  STEM FEM ACTIS STD SZ2 - [id number] Stem STEM FEM ACTIS STD SZ2  DEPUY SYNTHES [id number] Left 1  HEAD CERAMIC 36 PLUS5 - [id number] Hips HEAD CERAMIC 36 PLUS5  DEPUY SYNTHES [id number] Left 1

## 2018-11-11 ENCOUNTER — Encounter (HOSPITAL_COMMUNITY): Payer: Self-pay | Admitting: *Deleted

## 2018-11-11 LAB — CBC
HCT: 28.4 % — ABNORMAL LOW (ref 36.0–46.0)
Hemoglobin: 8.5 g/dL — ABNORMAL LOW (ref 12.0–15.0)
MCH: 23.9 pg — ABNORMAL LOW (ref 26.0–34.0)
MCHC: 29.9 g/dL — ABNORMAL LOW (ref 30.0–36.0)
MCV: 79.8 fL — ABNORMAL LOW (ref 80.0–100.0)
Platelets: 272 10*3/uL (ref 150–400)
RBC: 3.56 MIL/uL — ABNORMAL LOW (ref 3.87–5.11)
RDW: 14.4 % (ref 11.5–15.5)
WBC: 9.6 10*3/uL (ref 4.0–10.5)
nRBC: 0 % (ref 0.0–0.2)

## 2018-11-11 LAB — GLUCOSE, CAPILLARY
Glucose-Capillary: 122 mg/dL — ABNORMAL HIGH (ref 70–99)
Glucose-Capillary: 169 mg/dL — ABNORMAL HIGH (ref 70–99)
Glucose-Capillary: 315 mg/dL — ABNORMAL HIGH (ref 70–99)
Glucose-Capillary: 234 mg/dL — ABNORMAL HIGH (ref 70–99)

## 2018-11-11 LAB — BASIC METABOLIC PANEL
Anion gap: 10 (ref 5–15)
BUN: 11 mg/dL (ref 6–20)
CO2: 26 mmol/L (ref 22–32)
Calcium: 8.4 mg/dL — ABNORMAL LOW (ref 8.9–10.3)
Chloride: 101 mmol/L (ref 98–111)
Creatinine, Ser: 0.93 mg/dL (ref 0.44–1.00)
GFR calc Af Amer: >60 mL/min (ref >60)
GFR calc non Af Amer: >60 mL/min (ref >60)
Glucose, Bld: 144 mg/dL — ABNORMAL HIGH (ref 70–99)
Potassium: 3.8 mmol/L (ref 3.5–5.1)
Sodium: 137 mmol/L (ref 135–145)

## 2018-11-11 MED ORDER — LACTATED RINGERS IV BOLUS
500.0000 mL | Freq: Once | INTRAVENOUS | Status: AC
  Administered 2018-11-11: 500 mL via INTRAVENOUS

## 2018-11-11 MED ORDER — LACTATED RINGERS IV BOLUS
1000.0000 mL | Freq: Once | INTRAVENOUS | Status: AC
  Administered 2018-11-11: 500 mL via INTRAVENOUS

## 2018-11-11 NOTE — Plan of Care (Signed)
Problem: Education: Goal: Knowledge of the prescribed therapeutic regimen will improve Outcome: Progressing Goal: Understanding of discharge needs will improve Outcome: Progressing   Problem: Activity: Goal: Ability to avoid complications of mobility impairment will improve Outcome: Progressing   Problem: Clinical Measurements: Goal: Postoperative complications will be avoided or minimized Outcome: Progressing   Problem: Pain Management: Goal: Pain level will decrease with appropriate interventions Outcome: Progressing   Problem: Skin Integrity: Goal: Will show signs of wound healing Outcome: Progressing

## 2018-11-11 NOTE — TOC Transition Note (Addendum)
Transition of Care [facility]) - CM/SW Discharge Note   Patient Details  Name: Angelica Sharp MRN: [id number] Date of Birth: August 22, 1965  [facility]) CM/SW Contact:  Sharin Mons, RN Phone Number: 11/11/2018, 8:44 AM   Clinical Narrative:    Admitted with OA of L hip, s/p LEFT TOTAL HIP ARTHROPLASTY  6/1.  Plan: Transition to home today. Home health services in place, [facility] [place] to begin within 48 hrs post d/c. BSC will be delivered to bedside prior to d/c. Pt states already has rolling walker @ home. Pt states has transportation to home and no problems affording Rx meds.  Final next level of care: [place] Barriers to Discharge: Barriers Resolved   Patient Goals and CMS Choice        Discharge Placement                       Discharge Plan and Services                DME Arranged: 3-N-1 DME Agency: [employer] Date DME Agency Contacted: 11/11/18 Time DME Agency Contacted: [phone] Representative spoke with at DME Agency: [place]: PT [place]: [facility] (formerly [employer]) Date [place]: 11/11/18 Time [place]: [phone] Representative spoke with at [place]: [place] ([place]) Interventions     Readmission Risk Interventions No flowsheet data found.

## 2018-11-11 NOTE — Progress Notes (Signed)
Physical Therapy Treatment Patient Details Name: Angelica Sharp MRN: [id number] DOB: 1966-04-06 Today's Date: 11/11/2018    History of Present Illness Pt is a 53 y.o. female s/p L THA.    PT Comments    Pt progressing well with mobility. She required supervision transfers and ambulation 200 feet with RW. Stair training complete. LE HEP performed. Exercise handouts provided.   Follow Up Recommendations  Home health PT;Supervision/Assistance - 24 hour     Equipment Recommendations  None recommended by PT    Recommendations for Other Services       Precautions / Restrictions Precautions Precautions: Fall Precaution Comments: no hip precautions Restrictions Weight Bearing Restrictions: Yes LLE Weight Bearing: Weight bearing as tolerated    Mobility  Bed Mobility Overal bed mobility: Needs Assistance Bed Mobility: Supine to Sit     Supine to sit: Min assist;HOB elevated     General bed mobility comments: +rail, cues for sequencing, increased time and effort  Transfers Overall transfer level: Needs assistance Equipment used: Rolling walker (2 wheeled) Transfers: Sit to/from [facility] Sit to Stand: Supervision Stand pivot transfers: Supervision       General transfer comment: no physical assist. cues for sequencing.  Ambulation/Gait Ambulation/Gait assistance: Supervision Gait Distance (Feet): 200 Feet Assistive device: Rolling walker (2 wheeled) Gait Pattern/deviations: Step-through pattern;Decreased stride length;Antalgic Gait velocity: decreased Gait velocity interpretation: 1.31 - 2.62 ft/sec, indicative of limited community ambulator General Gait Details: steady gait   Stairs Stairs: Yes Stairs assistance: Min guard Stair Management: One rail Right;Forwards Number of Stairs: 3 General stair comments: cues for sequencing   Wheelchair Mobility    Modified Rankin (Stroke Patients Only)       Balance Overall balance assessment:  Mild deficits observed, not formally tested                                          Cognition Arousal/Alertness: Awake/alert Behavior During Therapy: WFL for tasks assessed/performed Overall Cognitive Status: Within Functional Limits for tasks assessed                                        Exercises Total Joint Exercises Ankle Circles/Pumps: AROM;Both;20 reps Quad Sets: AROM;Both;10 reps Short Arc Quad: AROM;Left;10 reps Heel Slides: AROM;Left;5 reps Hip ABduction/ADduction: AROM;Left;5 reps    General Comments        Pertinent Vitals/Pain Pain Assessment: Faces Faces Pain Scale: Hurts a little bit Pain Location: L hip during mobility Pain Descriptors / Indicators: Tightness;Discomfort;Sore Pain Intervention(s): Monitored during session    Home Living Family/patient expects to be discharged to:: Private residence Living Arrangements: Children(adult daughter and 2 grandchildren) Available Help at Discharge: Family;Available 24 hours/day(daughter is taking 1 week off work) Type of Home: House Home Access: Stairs to enter Entrance Stairs-Rails: None Home Layout: One level Home Equipment: Environmental consultant - 2 wheels;Cane - single point      Prior Function Level of Independence: Independent          PT Goals (current goals can now be found in the care plan section) Acute Rehab PT Goals Patient Stated Goal: home PT Goal Formulation: With patient Time For Goal Achievement: 11/18/18 Potential to Achieve Goals: Good Progress towards PT goals: Progressing toward goals    Frequency    7X/week  PT Plan Current plan remains appropriate    Co-evaluation              AM-PAC PT "6 Clicks" Mobility   Outcome Measure  Help needed turning from your back to your side while in a flat bed without using bedrails?: A Little Help needed moving from lying on your back to sitting on the side of a flat bed without using bedrails?: A  Little Help needed moving to and from a bed to a chair (including a wheelchair)?: A Little Help needed standing up from a chair using your arms (e.g., wheelchair or bedside chair)?: A Little Help needed to walk in hospital room?: A Little Help needed climbing 3-5 steps with a railing? : A Little 6 Click Score: 18    End of Session Equipment Utilized During Treatment: Gait belt Activity Tolerance: Patient tolerated treatment well Patient left: in chair;with call bell/phone within reach Nurse Communication: Mobility status PT Visit Diagnosis: Other abnormalities of gait and mobility (R26.89);Difficulty in walking, not elsewhere classified (R26.2);Pain Pain - Right/Left: Left Pain - part of body: Hip     Time: 3220-2542 PT Time Calculation (min) (ACUTE ONLY): 25 min  Charges:  $Gait Training: 8-22 mins $Therapeutic Exercise: 8-22 mins                     Lorrin Goodell, PT  Office # [phone] Pager [phone]    Lorriane Shire 11/11/2018, 12:40 PM

## 2018-11-11 NOTE — Progress Notes (Signed)
Subjective: 1 Day Post-Op Procedure(s) (LRB): LEFT TOTAL HIP ARTHROPLASTY ANTERIOR APPROACH (Left) Patient reports pain as mild.  Patient c/o mild dizziness and fatigue this am.  Objective: Vital signs in last 24 hours: Temp:  [97.6 F (36.4 C)-98.6 F (37 C)] 98.6 F (37 C) (06/02 0044) Pulse Rate:  [60-95] 68 (06/02 0044) Resp:  [7-19] 16 (06/02 0044) BP: (99-144)/(55-81) 99/55 (06/02 0044) SpO2:  [93 %-100 %] 94 % (06/02 0044) Weight:  [95.6 kg] 95.6 kg (06/01 0900)  Intake/Output from previous day: 06/01 0701 - 06/02 0700 In: 2862.5 [P.O.:700; I.V.:1962.5; IV Piggyback:200] Out: 1500 [Urine:1300; Blood:200] Intake/Output this shift: No intake/output data recorded.  Recent Labs    11/11/18 0453  HGB 8.5*   Recent Labs    11/11/18 0453  WBC 9.6  RBC 3.56*  HCT 28.4*  PLT 272   Recent Labs    11/11/18 0453  NA 137  K 3.8  CL 101  CO2 26  BUN 11  CREATININE 0.93  GLUCOSE 144*  CALCIUM 8.4*   No results for input(s): LABPT, INR in the last 72 hours.  Neurologically intact Neurovascular intact Sensation intact distally Intact pulses distally Dorsiflexion/Plantar flexion intact Incision: scant drainage No cellulitis present Compartment soft   Assessment/Plan: 1 Day Post-Op Procedure(s) (LRB): LEFT TOTAL HIP ARTHROPLASTY ANTERIOR APPROACH (Left) Advance diet Up with therapy Discharge home with home health possibly this afternoon as long as she is feeling well and mobilizing well with PT  WBAT LLE ABLA- hgb 8.5 this am. mildly symptomatic- will order fluid bolus and will continue to follow D/c foley       Aundra Dubin 11/11/2018, 7:26 AM

## 2018-11-11 NOTE — Discharge Summary (Addendum)
Patient ID: Angelica Sharp MRN: [id number] DOB/AGE: 55-13-1967 55 y.o.  Admit date: 11/10/2018 Discharge date: 11/12/2018  Admission Diagnoses:  Principal Problem:   Primary osteoarthritis of left hip Active Problems:   Status post total replacement of left hip   Discharge Diagnoses:  Same  Past Medical History:  Diagnosis Date  . Allergy   . Anemia   . Anxiety   . Arthritis   . Borderline hypertension   . Chest pain   . Complication of anesthesia   . Degenerative lumbar disc    L5S1  . Diabetes mellitus without complication ([place])   . Diverticulitis   . Dyspnea   . Edema    lower extremity  . Family history of anesthesia complication    sister had BP complications during anes.  . Fatty liver   . GERD (gastroesophageal reflux disease)   . H/O hiatal hernia   . Headache   . Hypertension   . Pneumonia   . PONV (postoperative nausea and vomiting)   . SOB (shortness of breath)     Surgeries: Procedure(s): LEFT TOTAL HIP ARTHROPLASTY ANTERIOR APPROACH on 11/10/2018   Consultants:   Discharged Condition: Improved  Hospital Course: Angelica Sharp is an 53 y.o. female who was admitted 11/10/2018 for operative treatment ofPrimary osteoarthritis of left hip. Patient has severe unremitting pain that affects sleep, daily activities, and work/hobbies. After pre-op clearance the patient was taken to the operating room on 11/10/2018 and underwent  Procedure(s): LEFT TOTAL HIP ARTHROPLASTY ANTERIOR APPROACH.    Patient was given perioperative antibiotics:  Anti-infectives (From admission, onward)   Start     Dose/Rate Route Frequency Ordered Stop   11/10/18 1700  ceFAZolin (ANCEF) IVPB 2g/100 mL premix     2 g 200 mL/hr over 30 Minutes Intravenous Every 6 hours 11/10/18 1657 11/11/18 0546   11/10/18 1659  ceFAZolin (ANCEF) 2-4 GM/100ML-% IVPB    Note to Pharmacy:  Gwenlyn Fudge   : cabinet override      11/10/18 1659 11/10/18 2216   11/10/18 1033  vancomycin (VANCOCIN) powder   Status:  Discontinued       As needed 11/10/18 1033 11/10/18 1304   11/10/18 0845  ceFAZolin (ANCEF) IVPB 2g/100 mL premix     2 g 200 mL/hr over 30 Minutes Intravenous On call to O.R. 11/10/18 0831 11/10/18 1044   11/10/18 0000  cephALEXin (KEFLEX) 500 MG capsule     500 mg Oral 4 times daily 11/10/18 0930         Patient was given sequential compression devices, early ambulation, and chemoprophylaxis to prevent DVT.  Patient benefited maximally from hospital stay and there were no complications.    Recent vital signs:  Patient Vitals for the past 24 hrs:  BP Temp Temp src Pulse Resp SpO2  11/12/18 0800 113/72 98.2 F (36.8 C) Oral 80 16 94 %  11/12/18 0500 112/63 98.6 F (37 C) Oral 79 18 95 %  11/11/18 2035 126/62 98.6 F (37 C) Oral 89 18 95 %  11/11/18 1751 117/64 (!) 97.5 F (36.4 C) Oral 86 17 93 %  11/11/18 1352 113/66 (!) 97.5 F (36.4 C) Oral 68 18 94 %  11/11/18 1022 95/66 98.2 F (36.8 C) Oral 73 16 93 %     Recent laboratory studies:  Recent Labs    11/11/18 0453 11/12/18 0418  WBC 9.6 10.9*  HGB 8.5* 8.0*  HCT 28.4* 26.2*  PLT 272 230  NA 137  --   K  3.8  --   CL 101  --   CO2 26  --   BUN 11  --   CREATININE 0.93  --   GLUCOSE 144*  --   CALCIUM 8.4*  --      Discharge Medications:   Allergies as of 11/12/2018      Reactions   Morphine And Related Other (See Comments)   Headache and hallucinations   Septra [sulfamethoxazole-trimethoprim] Other (See Comments)   TACHYCARDIA      Medication List    STOP taking these medications   acetaminophen 500 MG tablet Commonly known as:  TYLENOL   ALPRAZolam 0.25 MG tablet Commonly known as:  [employer] 500-32.5 MG Tabs Generic drug:  Aspirin-Caffeine   doxycycline 100 MG tablet Commonly known as:  VIBRA-TABS   ibuprofen 200 MG tablet Commonly known as:  ADVIL   TYLENOL SINUS+HEADACHE PO     TAKE these medications   aspirin EC 81 MG tablet Take 1 tablet (81 mg total) by  mouth 2 (two) times daily. What changed:  when to take this   AZO URINARY PAIN PO Take 2 tablets by mouth daily as needed (urinary discomfort).   CALCIUM+D3 PO Take 1 tablet by mouth daily.   cephALEXin 500 MG capsule Commonly known as:  KEFLEX Take 1 capsule (500 mg total) by mouth 4 (four) times daily.   esomeprazole 40 MG capsule Commonly known as:  NexIUM Take 1 capsule (40 mg total) by mouth daily.   glucose blood test strip Use as instructed   hydrochlorothiazide 25 MG tablet Commonly known as:  HYDRODIURIL Take 1 tablet (25 mg total) by mouth daily.   loperamide 2 MG tablet Commonly known as:  IMODIUM A-D Take 4 mg by mouth 4 (four) times daily as needed for diarrhea or loose stools.   loratadine 10 MG tablet Commonly known as:  CLARITIN Take 10 mg by mouth daily as needed for allergies.   metFORMIN 750 MG 24 hr tablet Commonly known as:  GLUCOPHAGE-XR Take 750 mg by mouth 2 (two) times daily.   methocarbamol 750 MG tablet Commonly known as:  ROBAXIN Take 1 tablet (750 mg total) by mouth 2 (two) times daily as needed for muscle spasms. What changed:    medication strength  how much to take  when to take this  reasons to take this   ondansetron 4 MG disintegrating tablet Commonly known as:  ZOFRAN-ODT Take 4 mg by mouth every 8 (eight) hours as needed for nausea or vomiting.   ondansetron 4 MG tablet Commonly known as:  ZOFRAN Take 1-2 tablets (4-8 mg total) by mouth every 8 (eight) hours as needed for nausea or vomiting.   oxyCODONE 5 MG immediate release tablet Commonly known as:  Oxy IR/ROXICODONE Take 1-3 tablets (5-15 mg total) by mouth 3 (three) times daily as needed.   oxyCODONE 10 mg 12 hr tablet Commonly known as:  OXYCONTIN Take 1 tablet (10 mg total) by mouth every 12 (twelve) hours for 3 days.   polyethylene glycol 17 g packet Commonly known as:  MIRALAX / GLYCOLAX Take 17 g by mouth daily as needed for mild constipation.    Potassium 99 MG Tabs Take 99-198 mg by mouth See admin instructions. Take 198 mg in the morning and 99 mg at night   pravastatin 40 MG tablet Commonly known as:  PRAVACHOL Take 1 tablet (40 mg total) by mouth daily. What changed:  when to take this  promethazine 25 MG tablet Commonly known as:  PHENERGAN Take 1 tablet (25 mg total) by mouth every 6 (six) hours as needed for nausea.   SALONPAS PAIN RELIEF PATCH EX Place 1 patch onto the skin daily as needed (pain.).   senna-docusate 8.6-50 MG tablet Commonly known as:  Senokot S Take 1-2 tablets by mouth at bedtime as needed.   sodium chloride 0.65 % Soln nasal spray Commonly known as:  OCEAN Place 1 spray into both nostrils as needed for congestion.   VISINE OP Place 1 drop into both eyes daily as needed (dry eyes).            Durable Medical Equipment  (From admission, onward)         Start     Ordered   11/10/18 2043  DME Walker rolling  Once    Question:  Patient needs a walker to treat with the following condition  Answer:  History of hip replacement   11/10/18 2042   11/10/18 2043  DME 3 n 1  Once     11/10/18 2042   11/10/18 2043  DME Bedside commode  Once    Question:  Patient needs a bedside commode to treat with the following condition  Answer:  History of hip replacement   11/10/18 2042          Diagnostic Studies: Dg Chest 2 View  Result Date: 10/30/2018 CLINICAL DATA:  Preop hip replacement EXAM: CHEST - 2 VIEW COMPARISON:  06/18/2018 FINDINGS: Heart and mediastinal contours are within normal limits. No focal opacities or effusions. No acute bony abnormality. IMPRESSION: No active cardiopulmonary disease. Electronically Signed   By: Rolm Baptise M.D.   On: 10/30/2018 14:15   Dg Pelvis Portable  Result Date: 11/10/2018 CLINICAL DATA:  LEFT hip arthroplasty EXAM: PORTABLE PELVIS 1-2 VIEWS COMPARISON:  07/25/2018 MR FINDINGS: LEFT total hip arthroplasty identified. No acute fracture or dislocation.  No complicating features are identified. IMPRESSION: LEFT total hip arthroplasty without complicating features. Electronically Signed   By: Margarette Canada M.D.   On: 11/10/2018 13:47   Dg C-arm 1-60 Min  Result Date: 11/10/2018 CLINICAL DATA:  Left hip arthroplasty. EXAM: DG C-ARM 61-120 MIN; OPERATIVE LEFT HIP WITH PELVIS COMPARISON:  MRI left hip dated July 25, 2018. FLUOROSCOPY TIME:  33 seconds. C-arm fluoroscopic images were obtained intraoperatively and submitted for post operative interpretation. FINDINGS: Intraoperative fluoroscopic images demonstrate interval left total hip arthroplasty. Components are well aligned. No acute osseous abnormality. IMPRESSION: Intraoperative fluoroscopic guidance for left total hip arthroplasty. Electronically Signed   By: Titus Dubin M.D.   On: 11/10/2018 13:13   Dg Hip Operative Unilat W Or W/o Pelvis Left  Result Date: 11/10/2018 CLINICAL DATA:  Left hip arthroplasty. EXAM: DG C-ARM 61-120 MIN; OPERATIVE LEFT HIP WITH PELVIS COMPARISON:  MRI left hip dated July 25, 2018. FLUOROSCOPY TIME:  33 seconds. C-arm fluoroscopic images were obtained intraoperatively and submitted for post operative interpretation. FINDINGS: Intraoperative fluoroscopic images demonstrate interval left total hip arthroplasty. Components are well aligned. No acute osseous abnormality. IMPRESSION: Intraoperative fluoroscopic guidance for left total hip arthroplasty. Electronically Signed   By: Titus Dubin M.D.   On: 11/10/2018 13:13    Disposition: Discharge disposition: 01-Home or Self Care         Follow-up Information    Leandrew Koyanagi, MD In 2 weeks.   Specialty:  Orthopedic Surgery Why:  For suture removal, For wound re-check Contact information: [place]  [id number] [phone]        [facility] Follow up.   Specialty:  Home Health Services Why:  home health services arranged Contact information: [place] [address] [phone]        [place] Follow up.   Why:  bedside commode will be delivered to bedside prior to discharge            Signed: Aundra Dubin 11/12/2018, 8:13 AM

## 2018-11-11 NOTE — Evaluation (Signed)
Physical Therapy Evaluation Patient Details Name: Angelica Sharp MRN: [id number] DOB: 14-Mar-1966 Today's Date: 11/11/2018   History of Present Illness  Pt is a 53 y.o. female s/p L THA.  Clinical Impression  Pt is s/p THA resulting in the deficits listed below (see PT Problem List). PTA pt independent with mobility. On eval she required min assist bed mobility and min guard assist transfers with RW. Unable to progress ambulation due to pt feeling lightheaded. O2 removed for mobility with desat to 89%. 1.5 L O2 replaced in recliner with SpO2 94%. Pt will benefit from skilled PT to increase their independence and safety with mobility to allow discharge to the venue listed below. PT to address ambulation, exercises and stair training during 2nd session today, as pt is able.      Follow Up Recommendations Home health PT;Supervision/Assistance - 24 hour    Equipment Recommendations  None recommended by PT    Recommendations for Other Services       Precautions / Restrictions Precautions Precautions: Fall Precaution Comments: no hip precautions Restrictions LLE Weight Bearing: Weight bearing as tolerated      Mobility  Bed Mobility Overal bed mobility: Needs Assistance Bed Mobility: Supine to Sit     Supine to sit: Min assist;HOB elevated     General bed mobility comments: +rail, cues for sequencing, increased time and effort  Transfers Overall transfer level: Needs assistance Equipment used: Rolling walker (2 wheeled) Transfers: Sit to/from [facility] Sit to Stand: Min guard Stand pivot transfers: Min guard       General transfer comment: cues for hand placement, increased time and effort, SPT with RW bed to recliner  Ambulation/Gait             General Gait Details: unable to progress ambulation due to pt feeling lightheaded. This is her first time OOB and hgb 8.5.  Stairs            Wheelchair Mobility    Modified Rankin (Stroke  Patients Only)       Balance Overall balance assessment: Mild deficits observed, not formally tested                                           Pertinent Vitals/Pain Pain Assessment: Faces Faces Pain Scale: Hurts little more Pain Location: L hip during mobility Pain Descriptors / Indicators: Tightness;Discomfort;Sore Pain Intervention(s): Monitored during session;Repositioned    Home Living Family/patient expects to be discharged to:: Private residence Living Arrangements: Children(adult daughter and 2 grandchildren) Available Help at Discharge: Family;Available 24 hours/day(daughter is taking 1 week off work) Type of Home: House Home Access: Stairs to enter Entrance Stairs-Rails: None Technical brewer of Steps: 3 Home Layout: One level Home Equipment: Environmental consultant - 2 wheels;Cane - single point      Prior Function Level of Independence: Independent               Hand Dominance        Extremity/Trunk Assessment   Upper Extremity Assessment Upper Extremity Assessment: Defer to OT evaluation    Lower Extremity Assessment Lower Extremity Assessment: LLE deficits/detail LLE Deficits / Details: s/p THA (direct anterior approach) LLE: Unable to fully assess due to pain    Cervical / Trunk Assessment Cervical / Trunk Assessment: Normal  Communication   Communication: No difficulties  Cognition Arousal/Alertness: Awake/alert Behavior During Therapy: WFL for tasks assessed/performed Overall  Cognitive Status: Within Functional Limits for tasks assessed                                        General Comments      Exercises     Assessment/Plan    PT Assessment Patient needs continued PT services  PT Problem List Decreased balance;Pain;Decreased mobility;Decreased knowledge of use of DME;Cardiopulmonary status limiting activity;Decreased activity tolerance       PT Treatment Interventions DME instruction;Functional  mobility training;Balance training;Patient/family education;Gait training;Therapeutic activities;Stair training;Therapeutic exercise    PT Goals (Current goals can be found in the Care Plan section)  Acute Rehab PT Goals Patient Stated Goal: home PT Goal Formulation: With patient Time For Goal Achievement: 11/18/18 Potential to Achieve Goals: Good    Frequency 7X/week   Barriers to discharge        Co-evaluation               AM-PAC PT "6 Clicks" Mobility  Outcome Measure Help needed turning from your back to your side while in a flat bed without using bedrails?: A Little Help needed moving from lying on your back to sitting on the side of a flat bed without using bedrails?: A Little Help needed moving to and from a bed to a chair (including a wheelchair)?: A Little Help needed standing up from a chair using your arms (e.g., wheelchair or bedside chair)?: A Little Help needed to walk in hospital room?: A Little Help needed climbing 3-5 steps with a railing? : A Lot 6 Click Score: 17    End of Session Equipment Utilized During Treatment: Gait belt;Oxygen Activity Tolerance: Treatment limited secondary to medical complications (Comment)(lightheaded) Patient left: in chair;with call bell/phone within reach Nurse Communication: Mobility status PT Visit Diagnosis: Other abnormalities of gait and mobility (R26.89);Difficulty in walking, not elsewhere classified (R26.2);Pain Pain - Right/Left: Left Pain - part of body: Hip    Time: 4709-6283 PT Time Calculation (min) (ACUTE ONLY): 17 min   Charges:   PT Evaluation $PT Eval Moderate Complexity: 1 Mod          Lorrin Goodell, PT  Office # [phone] Pager [phone]   Lorriane Shire 11/11/2018, 9:32 AM

## 2018-11-11 NOTE — Plan of Care (Signed)
  Problem: Education: Goal: Knowledge of the prescribed therapeutic regimen will improve Outcome: Progressing Goal: Understanding of discharge needs will improve Outcome: Progressing   Problem: Activity: Goal: Ability to avoid complications of mobility impairment will improve Outcome: Progressing   Problem: Clinical Measurements: Goal: Postoperative complications will be avoided or minimized Outcome: Progressing   Problem: Pain Management: Goal: Pain level will decrease with appropriate interventions Outcome: Progressing   Problem: Skin Integrity: Goal: Will show signs of wound healing Outcome: Progressing

## 2018-11-11 NOTE — Progress Notes (Signed)
Erlinda Hong, MD at bedside and made aware of pt's BP after 563mL LR bolus (95/66) and pt's emesis of 468mL brown, undigested food this am. Orders to follow. Will continue to monitor.

## 2018-11-11 NOTE — Social Work (Signed)
CSW acknowledging consult for SNF placement. Per RNCM note pt set up with home health and pt feels okay with returning home.  CSW signing off. Please consult if any additional needs arise.  Alexander Mt, [place] Work [phone]

## 2018-11-12 ENCOUNTER — Encounter (HOSPITAL_COMMUNITY): Payer: Self-pay | Admitting: General Practice

## 2018-11-12 LAB — GLUCOSE, CAPILLARY: Glucose-Capillary: 125 mg/dL — ABNORMAL HIGH (ref 70–99)

## 2018-11-12 LAB — CBC
HCT: 26.2 % — ABNORMAL LOW (ref 36.0–46.0)
Hemoglobin: 8 g/dL — ABNORMAL LOW (ref 12.0–15.0)
MCH: 23.9 pg — ABNORMAL LOW (ref 26.0–34.0)
MCHC: 30.5 g/dL (ref 30.0–36.0)
MCV: 78.2 fL — ABNORMAL LOW (ref 80.0–100.0)
Platelets: 230 10*3/uL (ref 150–400)
RBC: 3.35 MIL/uL — ABNORMAL LOW (ref 3.87–5.11)
RDW: 14.1 % (ref 11.5–15.5)
WBC: 10.9 10*3/uL — ABNORMAL HIGH (ref 4.0–10.5)
nRBC: 0 % (ref 0.0–0.2)

## 2018-11-12 NOTE — Progress Notes (Signed)
Physical Therapy Treatment Patient Details Name: Angelica Sharp MRN: [id number] DOB: 11/23/1965 Today's Date: 11/12/2018    History of Present Illness Pt is a 53 y.o. female s/p L THA.    PT Comments    Pt performed gait training and functional mobility during session this am.  Reviewed all exercises and frequency in HEP.  Pt participated in seated and standing exercises.  Plan for d/c this am to home with f/u HHPT and support from her family.    Follow Up Recommendations  Home health PT;Supervision/Assistance - 24 hour     Equipment Recommendations  None recommended by PT    Recommendations for Other Services       Precautions / Restrictions Precautions Precautions: Fall Precaution Comments: no hip precautions Restrictions Weight Bearing Restrictions: Yes LLE Weight Bearing: Weight bearing as tolerated    Mobility  Bed Mobility               General bed mobility comments: Pt seated edge of bed on arrival and ready to d/c home.   Transfers Overall transfer level: Needs assistance Equipment used: Rolling walker (2 wheeled) Transfers: Sit to/from Stand   Stand pivot transfers: Supervision       General transfer comment: no physical assist. cues for sequencing.  Ambulation/Gait Ambulation/Gait assistance: Supervision Gait Distance (Feet): 300 Feet Assistive device: Rolling walker (2 wheeled) Gait Pattern/deviations: Step-through pattern;Decreased stride length;Antalgic;Decreased stance time - left Gait velocity: decreased   General Gait Details: Cues for gait symmetry and weight shifting.     Stairs Stairs: Yes Stairs assistance: Supervision Stair Management: One rail Right;Forwards;Backwards Number of Stairs: 6 General stair comments: Cues for sequencing, performed x 3 forward with R rail.  Performed x3 forward ascending and backwards descending with R rail to simulate use of door frame at home.      Wheelchair Mobility    Modified Rankin (Stroke  Patients Only)       Balance Overall balance assessment: Mild deficits observed, not formally tested                                          Cognition Arousal/Alertness: Awake/alert Behavior During Therapy: WFL for tasks assessed/performed Overall Cognitive Status: Within Functional Limits for tasks assessed                                        Exercises Total Joint Exercises Ankle Circles/Pumps: (reviewed verbally) Quad Sets: (visually demonstrated) Short Arc Quad: (verbally reviewed) Heel Slides: (verbally reviewed) Hip ABduction/ADduction: AROM;10 reps;Standing;Left(verbally reviewed hip abduction in supine.  Pt performed in standing during session.  ) Long Arc Quad: AROM;Left;10 reps;Seated Knee Flexion: AROM;Left;10 reps;Standing Marching in Standing: AROM;10 reps;Standing;Left Standing Hip Extension: AROM;10 reps;Standing;Left    General Comments        Pertinent Vitals/Pain Pain Assessment: Faces Faces Pain Scale: Hurts little more Pain Location: L hip during mobility Pain Descriptors / Indicators: Tightness;Discomfort;Sore Pain Intervention(s): Monitored during session;Repositioned    Home Living                      Prior Function            PT Goals (current goals can now be found in the care plan section) Acute Rehab PT Goals Patient Stated Goal: home Potential to  Achieve Goals: Good Progress towards PT goals: Progressing toward goals    Frequency    7X/week      PT Plan Current plan remains appropriate    Co-evaluation              AM-PAC PT "6 Clicks" Mobility   Outcome Measure  Help needed turning from your back to your side while in a flat bed without using bedrails?: A Little Help needed moving from lying on your back to sitting on the side of a flat bed without using bedrails?: A Little Help needed moving to and from a bed to a chair (including a wheelchair)?: A Little Help needed  standing up from a chair using your arms (e.g., wheelchair or bedside chair)?: A Little Help needed to walk in hospital room?: A Little Help needed climbing 3-5 steps with a railing? : A Little 6 Click Score: 18    End of Session Equipment Utilized During Treatment: Gait belt Activity Tolerance: Patient tolerated treatment well Patient left: in chair;with call bell/phone within reach Nurse Communication: Mobility status PT Visit Diagnosis: Other abnormalities of gait and mobility (R26.89);Difficulty in walking, not elsewhere classified (R26.2);Pain Pain - Right/Left: Left Pain - part of body: Hip     Time: 9242-6834 PT Time Calculation (min) (ACUTE ONLY): 18 min  Charges:  $Gait Training: 8-22 mins                     Governor Rooks, PTA Acute Rehabilitation Services Pager [phone] Office [phone]     Yamen Castrogiovanni Eli Hose 11/12/2018, 11:05 AM

## 2018-11-12 NOTE — Care Management (Signed)
Notified Tiffany w [facility] that patient will DC today. Spoke w patient and verified that she has RW at home and 3/1 in room to take home. She has a ride home. No other CM needs.

## 2018-11-12 NOTE — Progress Notes (Signed)
Subjective: 2 Days Post-Op Procedure(s) (LRB): LEFT TOTAL HIP ARTHROPLASTY ANTERIOR APPROACH (Left) Patient reports pain as mild.  Feeling much better this am.  No nausea/vomiting, lightheadedness/dizziness  Objective: Vital signs in last 24 hours: Temp:  [97.5 F (36.4 C)-98.6 F (37 C)] 98.2 F (36.8 C) (06/03 0800) Pulse Rate:  [68-89] 80 (06/03 0800) Resp:  [16-18] 16 (06/03 0800) BP: (95-126)/(62-72) 113/72 (06/03 0800) SpO2:  [93 %-95 %] 94 % (06/03 0800)  Intake/Output from previous day: 06/02 0701 - 06/03 0700 In: 2233.8 [P.O.:660; I.V.:1573.8] Out: 1100 [Urine:700; Emesis/NG output:400] Intake/Output this shift: No intake/output data recorded.  Recent Labs    11/11/18 0453 11/12/18 0418  HGB 8.5* 8.0*   Recent Labs    11/11/18 0453 11/12/18 0418  WBC 9.6 10.9*  RBC 3.56* 3.35*  HCT 28.4* 26.2*  PLT 272 230   Recent Labs    11/11/18 0453  NA 137  K 3.8  CL 101  CO2 26  BUN 11  CREATININE 0.93  GLUCOSE 144*  CALCIUM 8.4*   No results for input(s): LABPT, INR in the last 72 hours.  Neurologically intact Neurovascular intact Sensation intact distally Intact pulses distally Dorsiflexion/Plantar flexion intact Incision: scant drainage No cellulitis present Compartment soft   Assessment/Plan: 2 Days Post-Op Procedure(s) (LRB): LEFT TOTAL HIP ARTHROPLASTY ANTERIOR APPROACH (Left) Advance diet Up with therapy D/C IV fluids Discharge home with home health this am (she did well with PT yesterday, so she is ok to leave without having PT this am if she would like) WBAT LLE ABLA- trended down to 8.0, but patient is asymptomatic.  No need for transfusion at this time       Aundra Dubin 11/12/2018, 8:11 AM

## 2018-11-13 ENCOUNTER — Encounter (HOSPITAL_COMMUNITY): Payer: Self-pay | Admitting: Orthopaedic Surgery

## 2018-11-13 NOTE — Plan of Care (Signed)

## 2018-11-13 NOTE — Anesthesia Postprocedure Evaluation (Signed)
Anesthesia Post Note  Patient: Angelica Sharp  Procedure(s) Performed: LEFT TOTAL HIP ARTHROPLASTY ANTERIOR APPROACH (Left Hip)     Patient location during evaluation: PACU Anesthesia Type: MAC and Spinal Level of consciousness: awake and alert Pain management: pain level controlled Vital Signs Assessment: post-procedure vital signs reviewed and stable Respiratory status: spontaneous breathing, nonlabored ventilation, respiratory function stable and patient connected to nasal cannula oxygen Cardiovascular status: stable and blood pressure returned to baseline Postop Assessment: no apparent nausea or vomiting and spinal receding Anesthetic complications: no    Last Vitals:  Vitals:   11/12/18 0500 11/12/18 0800  BP: 112/63 113/72  Pulse: 79 80  Resp: 18 16  Temp: 37 C 36.8 C  SpO2: 95% 94%    Last Pain:  Vitals:   11/12/18 0800  TempSrc: Oral  PainSc: 3                  Mairlyn Tegtmeyer

## 2018-11-14 DIAGNOSIS — E669 Obesity, unspecified: Secondary | ICD-10-CM | POA: Diagnosis not present

## 2018-11-14 DIAGNOSIS — E119 Type 2 diabetes mellitus without complications: Secondary | ICD-10-CM | POA: Diagnosis not present

## 2018-11-14 DIAGNOSIS — Z96642 Presence of left artificial hip joint: Secondary | ICD-10-CM | POA: Diagnosis not present

## 2018-11-14 DIAGNOSIS — I10 Essential (primary) hypertension: Secondary | ICD-10-CM | POA: Diagnosis not present

## 2018-11-14 DIAGNOSIS — K219 Gastro-esophageal reflux disease without esophagitis: Secondary | ICD-10-CM | POA: Diagnosis not present

## 2018-11-14 DIAGNOSIS — F419 Anxiety disorder, unspecified: Secondary | ICD-10-CM | POA: Diagnosis not present

## 2018-11-14 DIAGNOSIS — G8929 Other chronic pain: Secondary | ICD-10-CM | POA: Diagnosis not present

## 2018-11-14 DIAGNOSIS — Z7984 Long term (current) use of oral hypoglycemic drugs: Secondary | ICD-10-CM | POA: Diagnosis not present

## 2018-11-14 DIAGNOSIS — M79673 Pain in unspecified foot: Secondary | ICD-10-CM | POA: Diagnosis not present

## 2018-11-14 DIAGNOSIS — Z6832 Body mass index (BMI) 32.0-32.9, adult: Secondary | ICD-10-CM | POA: Diagnosis not present

## 2018-11-14 DIAGNOSIS — Z471 Aftercare following joint replacement surgery: Secondary | ICD-10-CM | POA: Diagnosis not present

## 2018-11-14 DIAGNOSIS — K5730 Diverticulosis of large intestine without perforation or abscess without bleeding: Secondary | ICD-10-CM | POA: Diagnosis not present

## 2018-11-14 DIAGNOSIS — D649 Anemia, unspecified: Secondary | ICD-10-CM | POA: Diagnosis not present

## 2018-11-14 DIAGNOSIS — M5137 Other intervertebral disc degeneration, lumbosacral region: Secondary | ICD-10-CM | POA: Diagnosis not present

## 2018-11-14 DIAGNOSIS — E785 Hyperlipidemia, unspecified: Secondary | ICD-10-CM | POA: Diagnosis not present

## 2018-11-17 ENCOUNTER — Telehealth: Payer: Self-pay | Admitting: Orthopaedic Surgery

## 2018-11-17 NOTE — Telephone Encounter (Signed)
[place] Physical Therapist with [facility] called in said she needs verbal orders for this pt for at home physical therapy for 1 time a week for 1 week and 3 times a week for 2 weeks.  [phone] You can leave a detailed vm.

## 2018-11-17 NOTE — Telephone Encounter (Signed)
Called Verdis Frederickson to approve orders.

## 2018-11-18 DIAGNOSIS — Z6832 Body mass index (BMI) 32.0-32.9, adult: Secondary | ICD-10-CM | POA: Diagnosis not present

## 2018-11-18 DIAGNOSIS — E119 Type 2 diabetes mellitus without complications: Secondary | ICD-10-CM | POA: Diagnosis not present

## 2018-11-18 DIAGNOSIS — F419 Anxiety disorder, unspecified: Secondary | ICD-10-CM | POA: Diagnosis not present

## 2018-11-18 DIAGNOSIS — I10 Essential (primary) hypertension: Secondary | ICD-10-CM | POA: Diagnosis not present

## 2018-11-18 DIAGNOSIS — M5137 Other intervertebral disc degeneration, lumbosacral region: Secondary | ICD-10-CM | POA: Diagnosis not present

## 2018-11-18 DIAGNOSIS — D649 Anemia, unspecified: Secondary | ICD-10-CM | POA: Diagnosis not present

## 2018-11-18 DIAGNOSIS — E669 Obesity, unspecified: Secondary | ICD-10-CM | POA: Diagnosis not present

## 2018-11-18 DIAGNOSIS — M79673 Pain in unspecified foot: Secondary | ICD-10-CM | POA: Diagnosis not present

## 2018-11-18 DIAGNOSIS — Z471 Aftercare following joint replacement surgery: Secondary | ICD-10-CM | POA: Diagnosis not present

## 2018-11-18 DIAGNOSIS — Z7984 Long term (current) use of oral hypoglycemic drugs: Secondary | ICD-10-CM | POA: Diagnosis not present

## 2018-11-18 DIAGNOSIS — E785 Hyperlipidemia, unspecified: Secondary | ICD-10-CM | POA: Diagnosis not present

## 2018-11-18 DIAGNOSIS — K219 Gastro-esophageal reflux disease without esophagitis: Secondary | ICD-10-CM | POA: Diagnosis not present

## 2018-11-18 DIAGNOSIS — Z96642 Presence of left artificial hip joint: Secondary | ICD-10-CM | POA: Diagnosis not present

## 2018-11-18 DIAGNOSIS — K5730 Diverticulosis of large intestine without perforation or abscess without bleeding: Secondary | ICD-10-CM | POA: Diagnosis not present

## 2018-11-18 DIAGNOSIS — G8929 Other chronic pain: Secondary | ICD-10-CM | POA: Diagnosis not present

## 2018-11-18 MED ORDER — OXYCODONE-ACETAMINOPHEN 5-325 MG PO TABS: 1.0000 | ORAL_TABLET | Freq: Three times a day (TID) | ORAL | 0 refills | Status: DC | PRN

## 2018-11-18 NOTE — Telephone Encounter (Signed)
Patient is calling asking for a refill on oxyCODONE (OXY IR/ROXICODONE) 5 MG immediate release tablet please advise?

## 2018-11-18 NOTE — Telephone Encounter (Signed)
Just sent in percocet

## 2018-11-20 DIAGNOSIS — G8929 Other chronic pain: Secondary | ICD-10-CM | POA: Diagnosis not present

## 2018-11-20 DIAGNOSIS — M79673 Pain in unspecified foot: Secondary | ICD-10-CM | POA: Diagnosis not present

## 2018-11-20 DIAGNOSIS — K5730 Diverticulosis of large intestine without perforation or abscess without bleeding: Secondary | ICD-10-CM | POA: Diagnosis not present

## 2018-11-20 DIAGNOSIS — Z7984 Long term (current) use of oral hypoglycemic drugs: Secondary | ICD-10-CM | POA: Diagnosis not present

## 2018-11-20 DIAGNOSIS — E785 Hyperlipidemia, unspecified: Secondary | ICD-10-CM | POA: Diagnosis not present

## 2018-11-20 DIAGNOSIS — D649 Anemia, unspecified: Secondary | ICD-10-CM | POA: Diagnosis not present

## 2018-11-20 DIAGNOSIS — Z6832 Body mass index (BMI) 32.0-32.9, adult: Secondary | ICD-10-CM | POA: Diagnosis not present

## 2018-11-20 DIAGNOSIS — Z471 Aftercare following joint replacement surgery: Secondary | ICD-10-CM | POA: Diagnosis not present

## 2018-11-20 DIAGNOSIS — E119 Type 2 diabetes mellitus without complications: Secondary | ICD-10-CM | POA: Diagnosis not present

## 2018-11-20 DIAGNOSIS — E669 Obesity, unspecified: Secondary | ICD-10-CM | POA: Diagnosis not present

## 2018-11-20 DIAGNOSIS — K219 Gastro-esophageal reflux disease without esophagitis: Secondary | ICD-10-CM | POA: Diagnosis not present

## 2018-11-20 DIAGNOSIS — F419 Anxiety disorder, unspecified: Secondary | ICD-10-CM | POA: Diagnosis not present

## 2018-11-20 DIAGNOSIS — I10 Essential (primary) hypertension: Secondary | ICD-10-CM | POA: Diagnosis not present

## 2018-11-20 DIAGNOSIS — M5137 Other intervertebral disc degeneration, lumbosacral region: Secondary | ICD-10-CM | POA: Diagnosis not present

## 2018-11-20 DIAGNOSIS — Z96642 Presence of left artificial hip joint: Secondary | ICD-10-CM | POA: Diagnosis not present

## 2018-11-21 DIAGNOSIS — I10 Essential (primary) hypertension: Secondary | ICD-10-CM | POA: Diagnosis not present

## 2018-11-21 DIAGNOSIS — Z96642 Presence of left artificial hip joint: Secondary | ICD-10-CM | POA: Diagnosis not present

## 2018-11-21 DIAGNOSIS — G8929 Other chronic pain: Secondary | ICD-10-CM | POA: Diagnosis not present

## 2018-11-21 DIAGNOSIS — M5137 Other intervertebral disc degeneration, lumbosacral region: Secondary | ICD-10-CM | POA: Diagnosis not present

## 2018-11-21 DIAGNOSIS — Z471 Aftercare following joint replacement surgery: Secondary | ICD-10-CM | POA: Diagnosis not present

## 2018-11-21 DIAGNOSIS — Z7984 Long term (current) use of oral hypoglycemic drugs: Secondary | ICD-10-CM | POA: Diagnosis not present

## 2018-11-21 DIAGNOSIS — F419 Anxiety disorder, unspecified: Secondary | ICD-10-CM | POA: Diagnosis not present

## 2018-11-21 DIAGNOSIS — K219 Gastro-esophageal reflux disease without esophagitis: Secondary | ICD-10-CM | POA: Diagnosis not present

## 2018-11-21 DIAGNOSIS — E785 Hyperlipidemia, unspecified: Secondary | ICD-10-CM | POA: Diagnosis not present

## 2018-11-21 DIAGNOSIS — D649 Anemia, unspecified: Secondary | ICD-10-CM | POA: Diagnosis not present

## 2018-11-21 DIAGNOSIS — E119 Type 2 diabetes mellitus without complications: Secondary | ICD-10-CM | POA: Diagnosis not present

## 2018-11-21 DIAGNOSIS — K5730 Diverticulosis of large intestine without perforation or abscess without bleeding: Secondary | ICD-10-CM | POA: Diagnosis not present

## 2018-11-21 DIAGNOSIS — Z6832 Body mass index (BMI) 32.0-32.9, adult: Secondary | ICD-10-CM | POA: Diagnosis not present

## 2018-11-21 DIAGNOSIS — E669 Obesity, unspecified: Secondary | ICD-10-CM | POA: Diagnosis not present

## 2018-11-21 DIAGNOSIS — M79673 Pain in unspecified foot: Secondary | ICD-10-CM | POA: Diagnosis not present

## 2018-11-24 DIAGNOSIS — Z96642 Presence of left artificial hip joint: Secondary | ICD-10-CM | POA: Diagnosis not present

## 2018-11-24 DIAGNOSIS — Z471 Aftercare following joint replacement surgery: Secondary | ICD-10-CM | POA: Diagnosis not present

## 2018-11-24 DIAGNOSIS — M5137 Other intervertebral disc degeneration, lumbosacral region: Secondary | ICD-10-CM | POA: Diagnosis not present

## 2018-11-24 DIAGNOSIS — K5730 Diverticulosis of large intestine without perforation or abscess without bleeding: Secondary | ICD-10-CM | POA: Diagnosis not present

## 2018-11-24 DIAGNOSIS — E785 Hyperlipidemia, unspecified: Secondary | ICD-10-CM | POA: Diagnosis not present

## 2018-11-24 DIAGNOSIS — G8929 Other chronic pain: Secondary | ICD-10-CM | POA: Diagnosis not present

## 2018-11-24 DIAGNOSIS — Z7984 Long term (current) use of oral hypoglycemic drugs: Secondary | ICD-10-CM | POA: Diagnosis not present

## 2018-11-24 DIAGNOSIS — M79673 Pain in unspecified foot: Secondary | ICD-10-CM | POA: Diagnosis not present

## 2018-11-24 DIAGNOSIS — F419 Anxiety disorder, unspecified: Secondary | ICD-10-CM | POA: Diagnosis not present

## 2018-11-24 DIAGNOSIS — Z6832 Body mass index (BMI) 32.0-32.9, adult: Secondary | ICD-10-CM | POA: Diagnosis not present

## 2018-11-24 DIAGNOSIS — E669 Obesity, unspecified: Secondary | ICD-10-CM | POA: Diagnosis not present

## 2018-11-24 DIAGNOSIS — E119 Type 2 diabetes mellitus without complications: Secondary | ICD-10-CM | POA: Diagnosis not present

## 2018-11-24 DIAGNOSIS — K219 Gastro-esophageal reflux disease without esophagitis: Secondary | ICD-10-CM | POA: Diagnosis not present

## 2018-11-24 DIAGNOSIS — D649 Anemia, unspecified: Secondary | ICD-10-CM | POA: Diagnosis not present

## 2018-11-24 DIAGNOSIS — I10 Essential (primary) hypertension: Secondary | ICD-10-CM | POA: Diagnosis not present

## 2018-11-25 ENCOUNTER — Ambulatory Visit (INDEPENDENT_AMBULATORY_CARE_PROVIDER_SITE_OTHER): Payer: BC Managed Care – PPO | Admitting: Physician Assistant

## 2018-11-25 ENCOUNTER — Encounter: Payer: Self-pay | Admitting: Orthopaedic Surgery

## 2018-11-25 ENCOUNTER — Other Ambulatory Visit: Payer: Self-pay

## 2018-11-25 DIAGNOSIS — Z96642 Presence of left artificial hip joint: Secondary | ICD-10-CM

## 2018-11-25 MED ORDER — HYDROCODONE-ACETAMINOPHEN 5-325 MG PO TABS: 1.0000 | ORAL_TABLET | Freq: Two times a day (BID) | ORAL | 0 refills | Status: DC | PRN

## 2018-11-25 NOTE — Progress Notes (Signed)
Post-Op Visit Note   Patient: Angelica Sharp           Date of Birth: Oct 18, 1965           MRN: [id number] Visit Date: 11/25/2018 PCP: Wendie Agreste, MD   Assessment & Plan:  Chief Complaint:  Chief Complaint  Patient presents with  . Left Hip - Routine Post Op   Visit Diagnoses:  1. Status post total hip replacement, left     Plan: Patient is a pleasant 53 year old female who presents our clinic today 15 days status post left anterior total hip replacement, date of surgery 11/10/2018.  She has been doing well.  She has been ambulating with a cane.  She is getting home health physical therapy 2-3 times a week.  She is taking 1-2 pain pills per day.  Overall, doing very well.  Examination of left hip reveals well healing surgical incision without evidence of infection or cellulitis.  She does have a small area of swelling to the very distal aspect of the incision.  Calves are soft and nontender.  Today, Steri-Strips were applied.  I have refilled her Norco.  I have given her a prescription for outpatient physical therapy.  She will follow-up with [place] in 4 weeks time for repeat evaluation and x-rays.  Call with concerns or questions in the meantime.  Follow-Up Instructions: Return in about 4 weeks (around 12/23/2018).   Orders:  No orders of the defined types were placed in this encounter.  Meds ordered this encounter  Medications  . HYDROcodone-acetaminophen (NORCO) 5-325 MG tablet    Sig: Take 1-2 tablets by mouth 2 (two) times daily as needed for moderate pain.    Dispense:  20 tablet    Refill:  0    Imaging: No new imaging  PMFS History: Patient Active Problem List   Diagnosis Date Noted  . Status post total replacement of left hip 11/10/2018  . Primary osteoarthritis of left hip 08/22/2018  . Essential hypertension 11/25/2017  . Foot pain 11/23/2017  . Chronic joint pain 03/29/2015  . DM II (diabetes mellitus, type II), controlled ([place]) 10/01/2014  . Obesity  03/12/2014  . 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 Hyperlipidemia 09/10/2013  . Family history of cancer 08/30/2012  . GERD (gastroesophageal reflux disease) 07/03/2011   Past Medical History:  Diagnosis Date  . Allergy   . Anemia   . Anxiety   . Arthritis   . Borderline hypertension   . Chest pain   . Complication of anesthesia   . Degenerative lumbar disc    L5S1  . Diabetes mellitus without complication ([place])   . Diverticulitis   . Dyspnea   . Edema    lower extremity  . Family history of anesthesia complication    sister had BP complications during anes.  . Fatty liver   . GERD (gastroesophageal reflux disease)   . H/O hiatal hernia   . Headache   . Hypertension   . Pneumonia   . PONV (postoperative nausea and vomiting)   . SOB (shortness of breath)     Family History  Problem Relation Age of Onset  . Heart attack Mother   . Diabetes Mother   . Hyperlipidemia Mother   . Hypertension Mother   . Heart attack Sister   . Pulmonary embolism Father   . Clotting disorder Father   . Diabetes Sister   . Kidney disease Sister        kidney failure  .  Heart disease Sister   . Cancer Sister   . Diabetes Son 77       TYPE 1    Past Surgical History:  Procedure Laterality Date  . ABDOMINAL HYSTERECTOMY    . BREAST LUMPECTOMY     3 lumps -- two from right -- one from left  . CESAREAN SECTION    . LAPAROSCOPIC ASSISTED VAGINAL HYSTERECTOMY N/A 09/18/2012   Procedure: LAPAROSCOPIC ASSISTED VAGINAL HYSTERECTOMY;  Surgeon: Linda Hedges, DO;  Location: [facility];  Service: Gynecology;  Laterality: N/A;  BLADDER  DISTENTION  . TOTAL HIP ARTHROPLASTY Left 11/10/2018   Procedure: LEFT TOTAL HIP ARTHROPLASTY ANTERIOR APPROACH;  Surgeon: Leandrew Koyanagi, MD;  Location: [facility];  Service: Orthopedics;  Laterality: Left;  . TUBAL LIGATION     Social History   Occupational History  . 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 Smoking status: Never Smoker  . Smokeless tobacco: Never Used  Substance and Sexual Activity  . Alcohol use: No  . Drug use: No  . Sexual activity: Not Currently

## 2018-11-26 DIAGNOSIS — F419 Anxiety disorder, unspecified: Secondary | ICD-10-CM | POA: Diagnosis not present

## 2018-11-26 DIAGNOSIS — K219 Gastro-esophageal reflux disease without esophagitis: Secondary | ICD-10-CM | POA: Diagnosis not present

## 2018-11-26 DIAGNOSIS — M5137 Other intervertebral disc degeneration, lumbosacral region: Secondary | ICD-10-CM | POA: Diagnosis not present

## 2018-11-26 DIAGNOSIS — Z96642 Presence of left artificial hip joint: Secondary | ICD-10-CM | POA: Diagnosis not present

## 2018-11-26 DIAGNOSIS — E119 Type 2 diabetes mellitus without complications: Secondary | ICD-10-CM | POA: Diagnosis not present

## 2018-11-26 DIAGNOSIS — D649 Anemia, unspecified: Secondary | ICD-10-CM | POA: Diagnosis not present

## 2018-11-26 DIAGNOSIS — Z6832 Body mass index (BMI) 32.0-32.9, adult: Secondary | ICD-10-CM | POA: Diagnosis not present

## 2018-11-26 DIAGNOSIS — E785 Hyperlipidemia, unspecified: Secondary | ICD-10-CM | POA: Diagnosis not present

## 2018-11-26 DIAGNOSIS — E669 Obesity, unspecified: Secondary | ICD-10-CM | POA: Diagnosis not present

## 2018-11-26 DIAGNOSIS — Z471 Aftercare following joint replacement surgery: Secondary | ICD-10-CM | POA: Diagnosis not present

## 2018-11-26 DIAGNOSIS — K5730 Diverticulosis of large intestine without perforation or abscess without bleeding: Secondary | ICD-10-CM | POA: Diagnosis not present

## 2018-11-26 DIAGNOSIS — G8929 Other chronic pain: Secondary | ICD-10-CM | POA: Diagnosis not present

## 2018-11-26 DIAGNOSIS — I10 Essential (primary) hypertension: Secondary | ICD-10-CM | POA: Diagnosis not present

## 2018-11-26 DIAGNOSIS — M79673 Pain in unspecified foot: Secondary | ICD-10-CM | POA: Diagnosis not present

## 2018-11-26 DIAGNOSIS — Z7984 Long term (current) use of oral hypoglycemic drugs: Secondary | ICD-10-CM | POA: Diagnosis not present

## 2018-11-28 DIAGNOSIS — E119 Type 2 diabetes mellitus without complications: Secondary | ICD-10-CM | POA: Diagnosis not present

## 2018-11-28 DIAGNOSIS — Z471 Aftercare following joint replacement surgery: Secondary | ICD-10-CM | POA: Diagnosis not present

## 2018-11-28 DIAGNOSIS — K219 Gastro-esophageal reflux disease without esophagitis: Secondary | ICD-10-CM | POA: Diagnosis not present

## 2018-11-28 DIAGNOSIS — E785 Hyperlipidemia, unspecified: Secondary | ICD-10-CM | POA: Diagnosis not present

## 2018-11-28 DIAGNOSIS — Z7984 Long term (current) use of oral hypoglycemic drugs: Secondary | ICD-10-CM | POA: Diagnosis not present

## 2018-11-28 DIAGNOSIS — Z6832 Body mass index (BMI) 32.0-32.9, adult: Secondary | ICD-10-CM | POA: Diagnosis not present

## 2018-11-28 DIAGNOSIS — K5730 Diverticulosis of large intestine without perforation or abscess without bleeding: Secondary | ICD-10-CM | POA: Diagnosis not present

## 2018-11-28 DIAGNOSIS — F419 Anxiety disorder, unspecified: Secondary | ICD-10-CM | POA: Diagnosis not present

## 2018-11-28 DIAGNOSIS — I10 Essential (primary) hypertension: Secondary | ICD-10-CM | POA: Diagnosis not present

## 2018-11-28 DIAGNOSIS — E669 Obesity, unspecified: Secondary | ICD-10-CM | POA: Diagnosis not present

## 2018-11-28 DIAGNOSIS — G8929 Other chronic pain: Secondary | ICD-10-CM | POA: Diagnosis not present

## 2018-11-28 DIAGNOSIS — Z96642 Presence of left artificial hip joint: Secondary | ICD-10-CM | POA: Diagnosis not present

## 2018-11-28 DIAGNOSIS — M5137 Other intervertebral disc degeneration, lumbosacral region: Secondary | ICD-10-CM | POA: Diagnosis not present

## 2018-11-28 DIAGNOSIS — M79673 Pain in unspecified foot: Secondary | ICD-10-CM | POA: Diagnosis not present

## 2018-11-28 DIAGNOSIS — D649 Anemia, unspecified: Secondary | ICD-10-CM | POA: Diagnosis not present

## 2018-12-04 ENCOUNTER — Encounter: Payer: Self-pay | Admitting: Orthopaedic Surgery

## 2018-12-08 NOTE — Telephone Encounter (Signed)
Will you let her know that this can be normal. Should get better with time. Let us know if still bothersome at her f/u appt

## 2018-12-16 ENCOUNTER — Encounter: Payer: Self-pay | Admitting: Orthopaedic Surgery

## 2018-12-16 ENCOUNTER — Other Ambulatory Visit: Payer: Self-pay | Admitting: Orthopaedic Surgery

## 2018-12-16 MED ORDER — OXYCODONE-ACETAMINOPHEN 5-325 MG PO TABS: 1.0000 | ORAL_TABLET | Freq: Every day | ORAL | 0 refills | Status: DC | PRN

## 2018-12-16 NOTE — Telephone Encounter (Signed)
We can refill it #20.  Thanks.

## 2018-12-23 DIAGNOSIS — Z1231 Encounter for screening mammogram for malignant neoplasm of breast: Secondary | ICD-10-CM | POA: Diagnosis not present

## 2018-12-23 LAB — HM MAMMOGRAPHY

## 2018-12-24 DIAGNOSIS — I10 Essential (primary) hypertension: Secondary | ICD-10-CM | POA: Diagnosis not present

## 2018-12-24 DIAGNOSIS — E119 Type 2 diabetes mellitus without complications: Secondary | ICD-10-CM | POA: Diagnosis not present

## 2018-12-24 DIAGNOSIS — Z7984 Long term (current) use of oral hypoglycemic drugs: Secondary | ICD-10-CM | POA: Diagnosis not present

## 2018-12-24 DIAGNOSIS — E782 Mixed hyperlipidemia: Secondary | ICD-10-CM | POA: Diagnosis not present

## 2018-12-29 ENCOUNTER — Encounter: Payer: Self-pay | Admitting: Family Medicine

## 2019-01-02 ENCOUNTER — Ambulatory Visit (INDEPENDENT_AMBULATORY_CARE_PROVIDER_SITE_OTHER): Payer: BC Managed Care – PPO

## 2019-01-02 ENCOUNTER — Ambulatory Visit (INDEPENDENT_AMBULATORY_CARE_PROVIDER_SITE_OTHER): Payer: BC Managed Care – PPO | Admitting: Physician Assistant

## 2019-01-02 ENCOUNTER — Encounter: Payer: Self-pay | Admitting: Physician Assistant

## 2019-01-02 VITALS — Ht 67.0 in | Wt 210.0 lb

## 2019-01-02 DIAGNOSIS — Z96642 Presence of left artificial hip joint: Secondary | ICD-10-CM | POA: Diagnosis not present

## 2019-01-02 NOTE — Progress Notes (Signed)
Post-Op Visit Note   Patient: Angelica Sharp           Date of Birth: [birth year]           MRN: [id number] Visit Date: 01/02/2019 PCP: Wendie Agreste, MD   Assessment & Plan:  Chief Complaint:  Chief Complaint  Patient presents with  . Left Hip - Routine Post Op    11/10/18 left total hip    Visit Diagnoses:  1. Status post total hip replacement, left     Plan: Patient is a pleasant 53 year old female who presents our clinic today 53 days status post left anterior total hip replacement, date of surgery 11/10/2018.  She has been doing well.  The pain she was getting in her buttocks has resolved.  She notes an occasional groin pain which has become less frequent.  She is still getting occasional tightness when trying to cross her left leg over the right.  Overall, doing very well.  At this point, she will continue to advance with activity as tolerated.  She will continue with her home exercise program.  Dental prophylaxis reinforced.  Follow-up with [place] in 6 weeks time for repeat evaluation.  Follow-Up Instructions: Return in about 6 weeks (around 02/13/2019).   Orders:  Orders Placed This Encounter  Procedures  . XR HIP UNILAT W OR W/O PELVIS 2-3 VIEWS LEFT   No orders of the defined types were placed in this encounter.   Imaging: Xr Hip Unilat W Or W/o Pelvis 2-3 Views Left  Result Date: 01/02/2019 X-rays demonstrate a well-seated prosthesis without complication   PMFS History: Patient Active Problem List   Diagnosis Date Noted  . Status post total replacement of left hip 11/10/2018  . Primary osteoarthritis of left hip 08/22/2018  . Essential hypertension 11/25/2017  . Foot pain 11/23/2017  . Chronic joint pain 03/29/2015  . DM II (diabetes mellitus, type II), controlled ([place]) 10/01/2014  . Obesity 03/12/2014  . 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 Hyperlipidemia 09/10/2013  . Family history of cancer 08/30/2012  . GERD (gastroesophageal reflux disease)  07/03/2011   Past Medical History:  Diagnosis Date  . Allergy   . Anemia   . Anxiety   . Arthritis   . Borderline hypertension   . Chest pain   . Complication of anesthesia   . Degenerative lumbar disc    L5S1  . Diabetes mellitus without complication ([place])   . Diverticulitis   . Dyspnea   . Edema    lower extremity  . Family history of anesthesia complication    sister had BP complications during anes.  . Fatty liver   . GERD (gastroesophageal reflux disease)   . H/O hiatal hernia   . Headache   . Hypertension   . Pneumonia   . PONV (postoperative nausea and vomiting)   . SOB (shortness of breath)     Family History  Problem Relation Age of Onset  . Heart attack Mother   . Diabetes Mother   . Hyperlipidemia Mother   . Hypertension Mother   . Heart attack Sister   . Pulmonary embolism Father   . Clotting disorder Father   . Diabetes Sister   . Kidney disease Sister        kidney failure  . Heart disease Sister   . Cancer Sister   . Diabetes Son 37       TYPE 1    Past Surgical History:  Procedure Laterality Date  . ABDOMINAL  HYSTERECTOMY    . BREAST LUMPECTOMY     3 lumps -- two from right -- one from left  . CESAREAN SECTION    . LAPAROSCOPIC ASSISTED VAGINAL HYSTERECTOMY N/A 09/18/2012   Procedure: LAPAROSCOPIC ASSISTED VAGINAL HYSTERECTOMY;  Surgeon: Linda Hedges, DO;  Location: [facility];  Service: Gynecology;  Laterality: N/A;  BLADDER  DISTENTION  . TOTAL HIP ARTHROPLASTY Left 11/10/2018   Procedure: LEFT TOTAL HIP ARTHROPLASTY ANTERIOR APPROACH;  Surgeon: Leandrew Koyanagi, MD;  Location: [place] [facility];  Service: Orthopedics;  Laterality: Left;  . TUBAL LIGATION     Social History   Occupational History  . 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 Smoking status: Never Smoker  . Smokeless tobacco: Never Used  Substance and Sexual Activity  . Alcohol use: No  . Drug use: No  .  Sexual activity: Not Currently

## 2019-02-13 ENCOUNTER — Ambulatory Visit: Payer: BC Managed Care – PPO | Admitting: Orthopaedic Surgery

## 2019-02-17 ENCOUNTER — Encounter: Payer: Self-pay | Admitting: Orthopaedic Surgery

## 2019-02-19 ENCOUNTER — Other Ambulatory Visit: Payer: Self-pay

## 2019-02-19 ENCOUNTER — Encounter: Payer: Self-pay | Admitting: Emergency Medicine

## 2019-02-19 ENCOUNTER — Telehealth (INDEPENDENT_AMBULATORY_CARE_PROVIDER_SITE_OTHER): Payer: BC Managed Care – PPO | Admitting: Emergency Medicine

## 2019-02-19 VITALS — Ht 67.0 in | Wt 210.0 lb

## 2019-02-19 DIAGNOSIS — R6889 Other general symptoms and signs: Secondary | ICD-10-CM

## 2019-02-19 DIAGNOSIS — R05 Cough: Secondary | ICD-10-CM | POA: Diagnosis not present

## 2019-02-19 DIAGNOSIS — R059 Cough, unspecified: Secondary | ICD-10-CM

## 2019-02-19 DIAGNOSIS — Z20822 Contact with and (suspected) exposure to covid-19: Secondary | ICD-10-CM

## 2019-02-19 MED ORDER — AZITHROMYCIN 250 MG PO TABS: ORAL_TABLET | ORAL | 0 refills | Status: DC

## 2019-02-19 MED ORDER — BENZONATATE 200 MG PO CAPS: 200.0000 mg | ORAL_CAPSULE | Freq: Two times a day (BID) | ORAL | 0 refills | Status: DC | PRN

## 2019-02-19 MED ORDER — HYDROCODONE-HOMATROPINE 5-1.5 MG/5ML PO SYRP: 5.0000 mL | ORAL_SOLUTION | Freq: Every evening | ORAL | 0 refills | Status: DC | PRN

## 2019-02-19 NOTE — Progress Notes (Signed)
Telemedicine Encounter- SOAP NOTE Established Patient  This telephone encounter was conducted with the patient's (or proxy's) verbal consent via audio telecommunications: yes/no: Yes Patient was instructed to have this encounter in a suitably private space; and to only have persons present to whom they give permission to participate. In addition, patient identity was confirmed by use of name plus two identifiers (DOB and address).  I discussed the limitations, risks, security and privacy concerns of performing an evaluation and management service by telephone and the availability of in person appointments. I also discussed with the patient that there may be a patient responsible charge related to this service. The patient expressed understanding and agreed to proceed.  I spent a total of TIME; 0 MIN TO 60 MIN: 15 minutes talking with the patient or their proxy.  No chief complaint on file. Cough and flulike symptoms  Subjective   Angelica Sharp is a 53 y.o. female established patient. Telephone visit today complaining of flulike symptoms that started 3 days ago with dry cough, low-grade fever, congestion, generalized achiness and malaise.  Has some dyspnea on exertion.  Denies wheezing, chest pain, nausea, vomiting, diarrhea or any other significant symptoms.  No one else sick at home.  She has been working from home the past several weeks.  HPI   Patient Active Problem List   Diagnosis Date Noted  . Status post total replacement of left hip 11/10/2018  . Primary osteoarthritis of left hip 08/22/2018  . Essential hypertension 11/25/2017  . Foot pain 11/23/2017  . Chronic joint pain 03/29/2015  . DM II (diabetes mellitus, type II), controlled ([place]) 10/01/2014  . Obesity 03/12/2014  . 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 Hyperlipidemia 09/10/2013  . Family history of cancer 08/30/2012  . GERD (gastroesophageal reflux disease) 07/03/2011    Past Medical History:  Diagnosis  Date  . Allergy   . Anemia   . Anxiety   . Arthritis   . Borderline hypertension   . Chest pain   . Complication of anesthesia   . Degenerative lumbar disc    L5S1  . Diabetes mellitus without complication ([place])   . Diverticulitis   . Dyspnea   . Edema    lower extremity  . Family history of anesthesia complication    sister had BP complications during anes.  . Fatty liver   . GERD (gastroesophageal reflux disease)   . H/O hiatal hernia   . Headache   . Hypertension   . Pneumonia   . PONV (postoperative nausea and vomiting)   . SOB (shortness of breath)     Current Outpatient Medications  Medication Sig Dispense Refill  . aspirin EC 81 MG tablet Take 1 tablet (81 mg total) by mouth 2 (two) times daily. 84 tablet 0  . Calcium Carb-Cholecalciferol (CALCIUM+D3 PO) Take 1 tablet by mouth daily.    Marland Kitchen esomeprazole (NEXIUM) 40 MG capsule Take 1 capsule (40 mg total) by mouth daily. 90 capsule 1  . glucose blood test strip Use as instructed 100 each 3  . hydrochlorothiazide (HYDRODIURIL) 25 MG tablet Take 1 tablet (25 mg total) by mouth daily. 90 tablet 3  . loperamide (IMODIUM A-D) 2 MG tablet Take 4 mg by mouth 4 (four) times daily as needed for diarrhea or loose stools.    . metFORMIN (GLUCOPHAGE-XR) 750 MG 24 hr tablet Take 750 mg by mouth 2 (two) times daily.     . ondansetron (ZOFRAN) 4 MG tablet Take 1-2 tablets (4-8  mg total) by mouth every 8 (eight) hours as needed for nausea or vomiting. 40 tablet 0  . Phenazopyridine HCl (AZO URINARY PAIN PO) Take 2 tablets by mouth daily as needed (urinary discomfort).    . polyethylene glycol (MIRALAX / GLYCOLAX) packet Take 17 g by mouth daily as needed for mild constipation.     . Potassium 99 MG TABS Take 99-198 mg by mouth See admin instructions. Take 198 mg in the morning and 99 mg at night    . pravastatin (PRAVACHOL) 40 MG tablet Take 1 tablet (40 mg total) by mouth daily. (Patient taking differently: Take 40 mg by mouth at  bedtime. ) 90 tablet 3  . Tetrahydrozoline HCl (VISINE OP) Place 1 drop into both eyes daily as needed (dry eyes).    Marland Kitchen HYDROcodone-acetaminophen (NORCO) 5-325 MG tablet Take 1-2 tablets by mouth 2 (two) times daily as needed for moderate pain. (Patient not taking: Reported on 02/19/2019) 20 tablet 0  . Liniments (SALONPAS PAIN RELIEF PATCH EX) Place 1 patch onto the skin daily as needed (pain.).    Marland Kitchen loratadine (CLARITIN) 10 MG tablet Take 10 mg by mouth daily as needed for allergies.    . methocarbamol (ROBAXIN) 750 MG tablet Take 1 tablet (750 mg total) by mouth 2 (two) times daily as needed for muscle spasms. (Patient not taking: Reported on 02/19/2019) 60 tablet 0  . ondansetron (ZOFRAN-ODT) 4 MG disintegrating tablet Take 4 mg by mouth every 8 (eight) hours as needed for nausea or vomiting.    Marland Kitchen oxyCODONE (OXY IR/ROXICODONE) 5 MG immediate release tablet Take 1-3 tablets (5-15 mg total) by mouth 3 (three) times daily as needed. (Patient not taking: Reported on 02/19/2019) 30 tablet 0  . oxyCODONE-acetaminophen (PERCOCET) 5-325 MG tablet Take 1-2 tablets by mouth daily as needed for severe pain. (Patient not taking: Reported on 02/19/2019) 20 tablet 0  . promethazine (PHENERGAN) 25 MG tablet Take 1 tablet (25 mg total) by mouth every 6 (six) hours as needed for nausea. (Patient not taking: Reported on 02/19/2019) 30 tablet 1  . senna-docusate (SENOKOT S) 8.6-50 MG tablet Take 1-2 tablets by mouth at bedtime as needed. (Patient not taking: Reported on 02/19/2019) 30 tablet 1  . sodium chloride (OCEAN) 0.65 % SOLN nasal spray Place 1 spray into both nostrils as needed for congestion.     No current facility-administered medications for this visit.     Allergies  Allergen Reactions  . Morphine And Related Other (See Comments)    Headache and hallucinations  . Septra [Sulfamethoxazole-Trimethoprim] Other (See Comments)    TACHYCARDIA    Social History   Socioeconomic History  . Marital status:  Divorced    Spouse name: Not on file  . Number of children: 3  . Years of education: Not on file  . Highest education level: Not on file  Occupational History  . 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 Financial resource strain: Not on file  . Food insecurity    Worry: Not on file    Inability: Not on file  . Transportation needs    Medical: Not on file    Non-medical: Not on file  Tobacco Use  . Smoking status: Never Smoker  . Smokeless tobacco: Never Used  Substance and Sexual Activity  . Alcohol use: No  . Drug use: No  . Sexual activity: Not Currently  Lifestyle  . Physical activity    Days per week: Not on file    Minutes per session: Not on file  . Stress: Not on file  Relationships  . Social Herbalist on phone: Not on file    Gets together: Not on file    Attends religious service: Not on file    Active member of club or organization: Not on file    Attends meetings of clubs or organizations: Not on file    Relationship status: Not on file  . Intimate partner violence    Fear of current or ex partner: Not on file    Emotionally abused: Not on file    Physically abused: Not on file    Forced sexual activity: Not on file  Other Topics Concern  . Not on file  Social History Narrative   Lives with daughter and grandkids.    Review of Systems  Constitutional: Positive for fever and malaise/fatigue.  HENT: Positive for congestion. Negative for sore throat.   Respiratory: Positive for cough and shortness of breath. Negative for hemoptysis, sputum production and wheezing.   Cardiovascular: Negative.  Negative for chest pain.  Gastrointestinal: Negative.  Negative for abdominal pain, blood in stool, diarrhea, nausea and vomiting.  Genitourinary: Negative.   Skin: Negative.  Negative for rash.  Neurological: Negative for dizziness and headaches.  All other systems  reviewed and are negative.   Objective  Alert and oriented x3 in no apparent respiratory distress Vitals as reported by the patient: Today's Vitals   02/19/19 1649  Weight: 210 lb (95.3 kg)  Height: 5\' 7"  (1.702 m)    There are no diagnoses linked to this encounter. Diagnoses and all orders for this visit:  Cough -     azithromycin (ZITHROMAX) 250 MG tablet; Sig as indicated -     benzonatate (TESSALON) 200 MG capsule; Take 1 capsule (200 mg total) by mouth 2 (two) times daily as needed for cough. -     HYDROcodone-homatropine (HYCODAN) 5-1.5 MG/5ML syrup; Take 5 mLs by mouth at bedtime as needed for cough.  Flu-like symptoms -     azithromycin (ZITHROMAX) 250 MG tablet; Sig as indicated  Suspected Covid-19 Virus Infection -     Novel Coronavirus, NAA ([facility])     I discussed the assessment and treatment plan with the patient. The patient was provided an opportunity to ask questions and all were answered. The patient agreed with the plan and demonstrated an understanding of the instructions.   The patient was advised to call back or seek an in-person evaluation if the symptoms worsen or if the condition fails to improve as anticipated.  I provided 15 minutes of non-face-to-face time during this encounter.  Horald Pollen, MD  Primary Care at [facility]

## 2019-02-19 NOTE — Progress Notes (Signed)
Called patient to triage. Patient states she has a headache, cough with yellow mucus, fever 100.2 last night and nausea for 3-4 days. Patient states she had a left hip replacement 11/10/2018 and she will discuss it.

## 2019-02-20 ENCOUNTER — Other Ambulatory Visit: Payer: Self-pay

## 2019-02-20 DIAGNOSIS — Z20822 Contact with and (suspected) exposure to covid-19: Secondary | ICD-10-CM

## 2019-02-22 LAB — NOVEL CORONAVIRUS, NAA: SARS-CoV-2, NAA: NOT DETECTED

## 2019-03-04 ENCOUNTER — Ambulatory Visit: Payer: BLUE CROSS/BLUE SHIELD | Admitting: Family Medicine

## 2019-03-24 DIAGNOSIS — E119 Type 2 diabetes mellitus without complications: Secondary | ICD-10-CM | POA: Diagnosis not present

## 2019-03-31 ENCOUNTER — Encounter: Payer: Self-pay | Admitting: Orthopaedic Surgery

## 2019-03-31 ENCOUNTER — Other Ambulatory Visit: Payer: Self-pay | Admitting: Orthopaedic Surgery

## 2019-03-31 MED ORDER — AMOXICILLIN 500 MG PO CAPS: 2000.0000 mg | ORAL_CAPSULE | Freq: Once | ORAL | 6 refills | Status: AC

## 2019-04-03 ENCOUNTER — Telehealth: Payer: BC Managed Care – PPO | Admitting: Nurse Practitioner

## 2019-04-03 DIAGNOSIS — J019 Acute sinusitis, unspecified: Secondary | ICD-10-CM | POA: Diagnosis not present

## 2019-04-03 DIAGNOSIS — B9789 Other viral agents as the cause of diseases classified elsewhere: Secondary | ICD-10-CM | POA: Diagnosis not present

## 2019-04-03 MED ORDER — FLUTICASONE PROPIONATE 50 MCG/ACT NA SUSP: 2.0000 | Freq: Every day | NASAL | 0 refills | Status: DC

## 2019-04-03 NOTE — Progress Notes (Signed)
We are sorry that you are not feeling well.  Here is how we plan to help!  Based on what you have shared with me it looks like you have sinusitis.  Sinusitis is inflammation and infection in the sinus cavities of the head.  Based on your presentation I believe you most likely have Acute Viral Sinusitis.This is an infection most likely caused by a virus. There is not specific treatment for viral sinusitis other than to help you with the symptoms until the infection runs its course.  You may use an oral decongestant such as Mucinex D or if you have glaucoma or high blood pressure use plain Mucinex. Saline nasal spray help and can safely be used as often as needed for congestion, I have prescribed: Fluticasone nasal spray two sprays in each nostril once a day. I also suggest taking Ceterizine or Zyrtec 10mg  daily until your symptoms improve.  This can be purchased over the counter.    Some authorities believe that zinc sprays or the use of Echinacea may shorten the course of your symptoms.  Sinus infections are not as easily transmitted as other respiratory infection, however we still recommend that you avoid close contact with loved ones, especially the very young and elderly.  Remember to wash your hands thoroughly throughout the day as this is the number one way to prevent the spread of infection! If your symptoms do not improve, I would suggest COVID-19 testing or following up with your PCP.  Home Care:  Only take medications as instructed by your medical team.  Do not take these medications with alcohol.  A steam or ultrasonic humidifier can help congestion.  You can place a towel over your head and breathe in the steam from hot water coming from a faucet.  Avoid close contacts especially the very young and the elderly.  Cover your mouth when you cough or sneeze.  Always remember to wash your hands.  Get Help Right Away If:  You develop worsening fever or sinus pain.  You develop a severe  head ache or visual changes.  Your symptoms persist after you have completed your treatment plan.  Make sure you  Understand these instructions.  Will watch your condition.  Will get help right away if you are not doing well or get worse.  Your e-visit answers were reviewed by a board certified advanced clinical practitioner to complete your personal care plan.  Depending on the condition, your plan could have included both over the counter or prescription medications.  If there is a problem please reply  once you have received a response from your provider.  Your safety is important to [place].  If you have drug allergies check your prescription carefully.    You can use MyChart to ask questions about today's visit, request a non-urgent call back, or ask for a work or school excuse for 24 hours related to this e-Visit. If it has been greater than 24 hours you will need to follow up with your provider, or enter a new e-Visit to address those concerns.  You will get an e-mail in the next two days asking about your experience.  I hope that your e-visit has been valuable and will speed your recovery. Thank you for using e-visits.  I have spent approximately 5-10 minutes reviewing and documenting in the patient's chart.

## 2019-05-11 ENCOUNTER — Other Ambulatory Visit: Payer: Self-pay | Admitting: Family Medicine

## 2019-05-11 DIAGNOSIS — K219 Gastro-esophageal reflux disease without esophagitis: Secondary | ICD-10-CM

## 2019-06-06 ENCOUNTER — Telehealth: Payer: BC Managed Care – PPO | Admitting: Nurse Practitioner

## 2019-06-06 DIAGNOSIS — R059 Cough, unspecified: Secondary | ICD-10-CM

## 2019-06-06 DIAGNOSIS — Z20828 Contact with and (suspected) exposure to other viral communicable diseases: Secondary | ICD-10-CM

## 2019-06-06 DIAGNOSIS — Z20822 Contact with and (suspected) exposure to covid-19: Secondary | ICD-10-CM

## 2019-06-06 DIAGNOSIS — R05 Cough: Secondary | ICD-10-CM

## 2019-06-06 MED ORDER — AZITHROMYCIN 250 MG PO TABS: ORAL_TABLET | ORAL | 0 refills | Status: DC

## 2019-06-06 MED ORDER — BENZONATATE 100 MG PO CAPS: 100.0000 mg | ORAL_CAPSULE | Freq: Three times a day (TID) | ORAL | 0 refills | Status: DC | PRN

## 2019-06-06 NOTE — Progress Notes (Signed)
We are sorry that you are not feeling well.  Here is how we plan to help!  Based on your presentation I believe you most likely have A cough due to bacteria.  When patients have a fever and a productive cough with a change in color or increased sputum production, we are concerned about bacterial bronchitis.  If left untreated it can progress to pneumonia.  If your symptoms do not improve with your treatment plan it is important that you contact your provider.   I have prescribed Azithromyin 250 mg: two tablets now and then one tablet daily for 4 additonal days    In addition you may use A prescription cough medication called Tessalon Perles 100mg . You may take 1-2 capsules every 8 hours as needed for your cough.  I ALSO RECOMMEND COVID TESTING!   Testing Information: The COVID-19 Community Testing sites will begin testing BY APPOINTMENT ONLY.  You can schedule online at [email or web address]  If you do not have access to a smart phone or computer you may call [phone] for an appointment.  Testing Locations: Appointment schedule is 8 am to 3:30 pm at all sites  [facility] indoors at [address], [place] [address] [facility]  indoors at [place]. [address], [place], [place] [address] [place] indoors at [address], [place] [address]  Additional testing sites in the Community:  . For [facility] Testing sites in [facility]  FaceUpdate.uy  . For Pop-up testing sites in [place]  BowlDirectory.co.uk  . For Testing sites with regular hours https://onsms.org/[place]/  . For [place] [email or web address].si  . For [facility] BasicJet.[email or web address]  . For [facility] testing  in [place] and [employer] BasicJet.[email or web address]  . For [facility] testing in [facility]   https://lhi.care/covidtesting  For  more information about community testing call [phone]   From your responses in the eVisit questionnaire you describe inflammation in the upper respiratory tract which is causing a significant cough.  This is commonly called Bronchitis and has four common causes:    Allergies  Viral Infections  Acid Reflux  Bacterial Infection Allergies, viruses and acid reflux are treated by controlling symptoms or eliminating the cause. An example might be a cough caused by taking certain blood pressure medications. You stop the cough by changing the medication. Another example might be a cough caused by acid reflux. Controlling the reflux helps control the cough.  USE OF BRONCHODILATOR ("RESCUE") INHALERS: There is a risk from using your bronchodilator too frequently.  The risk is that over-reliance on a medication which only relaxes the muscles surrounding the breathing tubes can reduce the effectiveness of medications prescribed to reduce swelling and congestion of the tubes themselves.  Although you feel brief relief from the bronchodilator inhaler, your asthma may actually be worsening with the tubes becoming more swollen and filled with mucus.  This can delay other crucial treatments, such as oral steroid medications. If you need to use a bronchodilator inhaler daily, several times per day, you should discuss this with your provider.  There are probably better treatments that could be used to keep your asthma under control.     HOME CARE . Only take medications as instructed by your medical team. . Complete the entire course of an antibiotic. . Drink plenty of fluids and get plenty of rest. . Avoid close contacts especially the very young and the elderly . Cover your mouth  if you cough or cough  into your sleeve. . Always remember to wash your hands . A steam or ultrasonic humidifier can help congestion.   GET HELP RIGHT AWAY IF: . You develop worsening fever. . You become short of breath . You cough up blood. . Your symptoms persist after you have completed your treatment plan MAKE SURE YOU   Understand these instructions.  Will watch your condition.  Will get help right away if you are not doing well or get worse.  Your e-visit answers were reviewed by a board certified advanced clinical practitioner to complete your personal care plan.  Depending on the condition, your plan could have included both over the counter or prescription medications. If there is a problem please reply  once you have received a response from your provider. Your safety is important to [place].  If you have drug allergies check your prescription carefully.    You can use MyChart to ask questions about today's visit, request a non-urgent call back, or ask for a work or school excuse for 24 hours related to this e-Visit. If it has been greater than 24 hours you will need to follow up with your provider, or enter a new e-Visit to address those concerns. You will get an e-mail in the next two days asking about your experience.  I hope that your e-visit has been valuable and will speed your recovery. Thank you for using e-visits.  5-10 minutes spent reviewing and documenting in chart.

## 2019-06-10 DIAGNOSIS — Z20828 Contact with and (suspected) exposure to other viral communicable diseases: Secondary | ICD-10-CM | POA: Diagnosis not present

## 2019-06-21 ENCOUNTER — Telehealth: Payer: BC Managed Care – PPO | Admitting: Family

## 2019-06-21 DIAGNOSIS — R079 Chest pain, unspecified: Secondary | ICD-10-CM

## 2019-06-21 DIAGNOSIS — R059 Cough, unspecified: Secondary | ICD-10-CM

## 2019-06-21 DIAGNOSIS — R05 Cough: Secondary | ICD-10-CM

## 2019-06-21 DIAGNOSIS — R062 Wheezing: Secondary | ICD-10-CM

## 2019-06-21 NOTE — Progress Notes (Signed)
Based on what you shared with me, I feel your condition warrants further evaluation and I recommend that you be seen for a face to face office visit.   Given your chest pain with cough you need to be seen face to face to rule out pneumonia.     NOTE: If you entered your credit card information for this eVisit, you will not be charged. You may see a "hold" on your card for the $35 but that hold will drop off and you will not have a charge processed.   If you are having a true medical emergency please call 911.      For an urgent face to face visit, [facility] has five urgent care centers for your convenience:      NEW:  [facility] [place] at [place] Get Driving Directions [id number] [place], [place] [address] . 10 am - 6pm Monday - Friday    [facility] [place]) Get Driving Directions [id number] [address] [place], [place] [address] . 10 am to 8 pm Monday-Friday . 12 pm to 8 pm [facility] at [facility] Get Driving Directions [id number] [place], [place] [employer] [place], [place] [address] . 8 am to 8 pm Monday-Friday . 9 am to 6 pm Saturday . 11 am to 6 pm Sunday     [facility] at [facility] Get Driving Directions  [id number] [address] [place], [place] [address] . 8 am to 8 pm Monday-Friday . 8 am to 4 pm [facility] at [place] Get Driving Directions [id number] [place] [place], [place] [address] . 12 pm to 6 pm Monday-Friday      Your e-visit answers were reviewed by a board certified advanced clinical practitioner to complete your personal care plan.  Thank you for using e-Visits.

## 2019-07-02 ENCOUNTER — Telehealth: Payer: BC Managed Care – PPO | Admitting: Physician Assistant

## 2019-07-02 DIAGNOSIS — N390 Urinary tract infection, site not specified: Secondary | ICD-10-CM

## 2019-07-02 MED ORDER — CEPHALEXIN 500 MG PO CAPS: ORAL_CAPSULE | ORAL | 0 refills | Status: DC

## 2019-07-02 NOTE — Progress Notes (Signed)

## 2019-08-06 ENCOUNTER — Other Ambulatory Visit: Payer: Self-pay | Admitting: Family Medicine

## 2019-08-06 DIAGNOSIS — K219 Gastro-esophageal reflux disease without esophagitis: Secondary | ICD-10-CM

## 2019-08-06 NOTE — Telephone Encounter (Signed)
Requested Prescriptions  Pending Prescriptions Disp Refills  . esomeprazole (NEXIUM) 40 MG capsule [Pharmacy Med Name: Esomeprazole Magnesium 40 MG Oral Capsule Delayed Release] 90 capsule 0    Sig: Take 1 capsule by mouth once daily     Gastroenterology: Proton Pump Inhibitors Passed - 08/06/2019  8:16 AM      Passed - Valid encounter within last 12 months    Recent Outpatient Visits          5 months ago Cough   Primary Care at [facility], Ines Bloomer, MD   11 months ago Annual physical exam   Primary Care at [facility], MD   11 months ago Screening for thyroid disorder   Primary Care at [facility], Ranell Patrick, MD   1 year ago Acute hip pain, left   Primary Care at [facility], Arlie Solomons, MD   1 year ago Cough   Primary Care at [facility], Gelene Mink, PA-C      Future Appointments            In 3 weeks Carlota Raspberry, Ranell Patrick, MD Primary Care at [place], [facility]

## 2019-08-31 ENCOUNTER — Encounter: Payer: Self-pay | Admitting: Family Medicine

## 2019-08-31 ENCOUNTER — Ambulatory Visit (INDEPENDENT_AMBULATORY_CARE_PROVIDER_SITE_OTHER): Payer: BC Managed Care – PPO | Admitting: Family Medicine

## 2019-08-31 ENCOUNTER — Other Ambulatory Visit: Payer: Self-pay

## 2019-08-31 VITALS — BP 118/69 | HR 95 | Temp 98.6°F | Ht 67.0 in | Wt 208.0 lb

## 2019-08-31 DIAGNOSIS — E119 Type 2 diabetes mellitus without complications: Secondary | ICD-10-CM | POA: Diagnosis not present

## 2019-08-31 DIAGNOSIS — E785 Hyperlipidemia, unspecified: Secondary | ICD-10-CM | POA: Diagnosis not present

## 2019-08-31 DIAGNOSIS — I10 Essential (primary) hypertension: Secondary | ICD-10-CM | POA: Diagnosis not present

## 2019-08-31 DIAGNOSIS — Z0001 Encounter for general adult medical examination with abnormal findings: Secondary | ICD-10-CM | POA: Diagnosis not present

## 2019-08-31 DIAGNOSIS — L989 Disorder of the skin and subcutaneous tissue, unspecified: Secondary | ICD-10-CM

## 2019-08-31 DIAGNOSIS — Z0000 Encounter for general adult medical examination without abnormal findings: Secondary | ICD-10-CM

## 2019-08-31 DIAGNOSIS — R609 Edema, unspecified: Secondary | ICD-10-CM | POA: Diagnosis not present

## 2019-08-31 DIAGNOSIS — Z1159 Encounter for screening for other viral diseases: Secondary | ICD-10-CM | POA: Diagnosis not present

## 2019-08-31 MED ORDER — HYDROCHLOROTHIAZIDE 25 MG PO TABS: 25.0000 mg | ORAL_TABLET | Freq: Every day | ORAL | 3 refills | Status: DC

## 2019-08-31 MED ORDER — PRAVASTATIN SODIUM 40 MG PO TABS: 40.0000 mg | ORAL_TABLET | Freq: Every day | ORAL | 3 refills | Status: DC

## 2019-08-31 NOTE — Patient Instructions (Addendum)
If you would like the shingles vaccine, that can be requested from your pharmacy.   I do recommend the covid vaccine, please let me know if there are questions.  Here is a link to information about the COVID-19 vaccine: [email or web address] COVID-19 Vaccine Information can be found at: [employer]uk For questions related to vaccine distribution or appointments, please email [email or web address] or call [phone].   I will refer you to dermatology to evaluate the area on your leg.   No med changes at this time.   I will check A1c and other labs. Please let me know if there are questions. Thanks for coming in today.   Keeping You Healthy  Get These Tests  Blood Pressure- Have your blood pressure checked by your healthcare provider at least once a year.  Normal blood pressure is 120/80.  Weight- Have your body mass index (BMI) calculated to screen for obesity.  BMI is a measure of body fat based on height and weight.  You can calculate your own BMI at [employer]  Cholesterol- Have your cholesterol checked every year.  Diabetes- Have your blood sugar checked every year if you have high blood pressure, high cholesterol, a family history of diabetes or if you are overweight.  Pap Test - Have a pap test every 1 to 5 years if you have been sexually active.  If you are older than 65 and recent pap tests have been normal you may not need additional pap tests.  In addition, if you have had a hysterectomy  for benign disease additional pap tests are not necessary.  Mammogram-Yearly mammograms are essential for early detection of breast cancer  Screening for Colon Cancer- Colonoscopy starting at age 80. Screening may begin sooner depending on your family history and other health conditions.  Follow up colonoscopy as directed by your Gastroenterologist.  Screening for Osteoporosis- Screening begins  at age 33 with bone density scanning, sooner if you are at higher risk for developing Osteoporosis.  Get these medicines  Calcium with Vitamin D- Your body requires 1200-1500 mg of Calcium a day and [phone] IU of Vitamin D a day.  You can only absorb 500 mg of Calcium at a time therefore Calcium must be taken in 2 or 3 separate doses throughout the day.  Hormones- Hormone therapy has been associated with increased risk for certain cancers and heart disease.  Talk to your healthcare provider about if you need relief from menopausal symptoms.  Aspirin- Ask your healthcare provider about taking Aspirin to prevent Heart Disease and Stroke.  Get these Immuniztions  Flu shot- Every fall  Pneumonia shot- Once after the age of 44; if you are younger ask your healthcare provider if you need a pneumonia shot.  Tetanus- Every ten years.  Zostavax- Once after the age of 75 to prevent shingles.  Take these steps  Don't smoke- Your healthcare provider can help you quit. For tips on how to quit, ask your healthcare provider or go to www.smokefree.gov or call 1-800 QUIT-NOW.  Be physically active- Exercise 5 days a week for a minimum of 30 minutes.  If you are not already physically active, start slow and gradually work up to 30 minutes of moderate physical activity.  Try walking, dancing, bike riding, swimming, etc.  Eat a healthy diet- Eat a variety of healthy foods such as fruits, vegetables, whole grains, low fat milk, low fat cheeses, yogurt, lean meats, chicken, fish, eggs, dried beans, tofu, etc.  For more information go to www.thenutritionsource.org  Dental  visit- Brush and floss teeth twice daily; visit your dentist twice a year.  Eye exam- Visit your Optometrist or Ophthalmologist yearly.  Drink alcohol in moderation- Limit alcohol intake to one drink or less a day.  Never drink and drive.  Depression- Your emotional health is as important as your physical health.  If you're feeling down  or losing interest in things you normally enjoy, please talk to your healthcare provider.  Seat Belts- can save your life; always wear one  Smoke/Carbon Monoxide detectors- These detectors need to be installed on the appropriate level of your home.  Replace batteries at least once a year.  Violence- If anyone is threatening or hurting you, please tell your healthcare provider.  Living Will/ Health care power of attorney- Discuss with your healthcare provider and family.   If you have lab work done today you will be contacted with your lab results within the next 2 weeks.  If you have not heard from [place] then please contact us. The fastest way to get your results is to register for My Chart.   IF you received an x-ray today, you will receive an invoice from [facility]. Please contact [facility] at [phone] with questions or concerns regarding your invoice.   IF you received labwork today, you will receive an invoice from [facility]. Please contact [facility] at [phone] with questions or concerns regarding your invoice.   Our billing staff will not be able to assist you with questions regarding bills from these companies.  You will be contacted with the lab results as soon as they are available. The fastest way to get your results is to activate your My Chart account. Instructions are located on the last page of this paperwork. If you have not heard from [place] regarding the results in 2 weeks, please contact this office.

## 2019-08-31 NOTE — Progress Notes (Signed)
Subjective:  Patient ID: Angelica Sharp, female    DOB: 15-May-1966  Age: 54 y.o. MRN: [id number]  CC:  Chief Complaint  Patient presents with  . Annual Exam    pt states she feels fine today with no complants. pt states her medication work fine with no side effects.    HPI Angelica Sharp presents for annual exam, med review.   Diabetes: Followed by endocrinology- Dr. Posey Pronto. Visits once per year.  Last A1c 6.5.  Requests repeat today.  Metformin 500mg  tid.  Microalbumin: normal ratio.  Optho, foot exam, pneumovax: foot exam at endocrine in 12/24/2018 at endocrine. Yearly follow up with optho - 03/30/19  On calcium/vit d supplement  Lab Results  Component Value Date   HGBA1C 7.3 (H) 08/31/2019   HGBA1C 6.8 (H) 10/30/2018   HGBA1C 6.6 (H) 08/28/2018   Lab Results  Component Value Date   MICROALBUR 0.8 03/29/2015   LDLCALC 96 08/31/2019   CREATININE 0.87 08/31/2019   Hypertension: hctz 25mg  qd,  Home readings: rare - doing well. No new side effects with meds.  BP Readings from Last 3 Encounters:  08/31/19 118/69  11/12/18 113/72  10/30/18 (!) 133/52   Lab Results  Component Value Date   CREATININE 0.87 08/31/2019   Hyperlipidemia: Pravastatin 40mg  qd. No new myalgias, side effects.  Lab Results  Component Value Date   CHOL 190 08/31/2019   HDL 42 08/31/2019   LDLCALC 96 08/31/2019   TRIG 307 (H) 08/31/2019   CHOLHDL 4.5 (H) 08/31/2019   Lab Results  Component Value Date   ALT 38 (H) 08/31/2019   AST 51 (H) 08/31/2019   ALKPHOS 117 08/31/2019   BILITOT 0.2 08/31/2019    GERD: nexium 40mg  qd. Managing symptoms well with daily use. No breakthrough symptoms usually. Ketchup as trigger only.  Allergic Rhinitis: Stable with flonase as needed.   S/p L hip THR 11/2018 - Dr. Erlinda Hong.  Bump on left lower leg: Present for 2 years, same size. No other lesions.  No skin cancers in past. No derm. No pain, itching.   Cancer screening: Colon: colonoscopy 06/12/11 Pap  testing: hysterectomy in 2014, for benign reasons. Plan on vulvar exam today. No new lesions.  Breast - mammogram 12/23/18.   Immunization History  Administered Date(s) Administered  . Influenza Whole 04/18/2010  . Influenza,inj,Quad PF,6+ Mos 03/24/2013, 03/12/2014, 03/29/2015, 08/29/2018  . Pneumococcal Conjugate-13 07/23/2014  . Tdap 04/18/2011  shingrix: has not had- declines for now.  covid 19 vaccine - not planning on getting that at this time.   Depression screen [facility] 2/9 08/31/2019 02/19/2019 08/29/2018 07/01/2018 06/18/2018  Decreased Interest 0 0 0 0 0  Down, Depressed, Hopeless 0 0 0 0 0  PHQ - 2 Score 0 0 0 0 0  Altered sleeping - - - 0 -  Tired, decreased energy - - - 0 -  Change in appetite - - - 0 -  Feeling bad or failure about yourself  - - - 0 -  Trouble concentrating - - - 0 -  Moving slowly or fidgety/restless - - - 0 -  Suicidal thoughts - - - 0 -  PHQ-9 Score - - - 0 -  Difficult doing work/chores - - - Not difficult at all -    Hearing Screening   125Hz  250Hz  500Hz  1000Hz  2000Hz  3000Hz  4000Hz  6000Hz  8000Hz   Right ear:           Left ear:  Visual Acuity Screening   Right eye Left eye Both eyes  Without correction:     With correction: 20/13 20/70 20/13   R eye distance, left eye close up.   Dental: ongoing care - procedure few weeks ago. Delayed cleaning.   Exercise: restarting walking with weather change and hip recovery.   History Patient Active Problem List   Diagnosis Date Noted  . Status post total replacement of left hip 11/10/2018  . Primary osteoarthritis of left hip 08/22/2018  . Essential hypertension 11/25/2017  . Foot pain 11/23/2017  . Chronic joint pain 03/29/2015  . DM II (diabetes mellitus, type II), controlled ([place]) 10/01/2014  . Obesity 03/12/2014  . 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 Hyperlipidemia 09/10/2013  . Family history of cancer 08/30/2012  . GERD (gastroesophageal reflux disease) 07/03/2011   Past  Medical History:  Diagnosis Date  . Allergy   . Anemia   . Anxiety   . Arthritis   . Borderline hypertension   . Chest pain   . Complication of anesthesia   . Degenerative lumbar disc    L5S1  . Diabetes mellitus without complication ([place])   . Diverticulitis   . Dyspnea   . Edema    lower extremity  . Family history of anesthesia complication    sister had BP complications during anes.  . Fatty liver   . GERD (gastroesophageal reflux disease)   . H/O hiatal hernia   . Headache   . Hypertension   . Pneumonia   . PONV (postoperative nausea and vomiting)   . SOB (shortness of breath)    Past Surgical History:  Procedure Laterality Date  . ABDOMINAL HYSTERECTOMY    . BREAST LUMPECTOMY     3 lumps -- two from right -- one from left  . CESAREAN SECTION    . LAPAROSCOPIC ASSISTED VAGINAL HYSTERECTOMY N/A 09/18/2012   Procedure: LAPAROSCOPIC ASSISTED VAGINAL HYSTERECTOMY;  Surgeon: Linda Hedges, DO;  Location: [facility];  Service: Gynecology;  Laterality: N/A;  BLADDER  DISTENTION  . TOTAL HIP ARTHROPLASTY Left 11/10/2018   Procedure: LEFT TOTAL HIP ARTHROPLASTY ANTERIOR APPROACH;  Surgeon: Leandrew Koyanagi, MD;  Location: [facility];  Service: Orthopedics;  Laterality: Left;  . TUBAL LIGATION     Allergies  Allergen Reactions  . Morphine And Related Other (See Comments)    Headache and hallucinations  . Septra [Sulfamethoxazole-Trimethoprim] Other (See Comments)    TACHYCARDIA   Prior to Admission medications   Medication Sig Start Date End Date Taking? Authorizing Provider  Calcium Carb-Cholecalciferol (CALCIUM+D3 PO) Take 1 tablet by mouth daily.   Yes [provider]  cephALEXin (KEFLEX) 500 MG capsule 1 cap po bid x 7 days 07/02/19  Yes Muthersbaugh, Jarrett Soho, PA-C  esomeprazole (NEXIUM) 40 MG capsule Take 1 capsule by mouth once daily 08/06/19  Yes Wendie Agreste, MD  glucose blood test strip Use as instructed 05/10/15  Yes Jeffery, Chelle, PA  hydrochlorothiazide  (HYDRODIURIL) 25 MG tablet Take 1 tablet (25 mg total) by mouth daily. 08/29/18  Yes Wendie Agreste, MD  loperamide (IMODIUM A-D) 2 MG tablet Take 4 mg by mouth 4 (four) times daily as needed for diarrhea or loose stools.   Yes [provider]  metFORMIN (GLUCOPHAGE-XR) 750 MG 24 hr tablet Take 750 mg by mouth 2 (two) times daily.  12/17/16  Yes [provider]  ondansetron (ZOFRAN-ODT) 4 MG disintegrating tablet Take 4 mg by mouth every 8 (eight) hours as needed for nausea  or vomiting.   Yes [provider]  oxyCODONE-acetaminophen (PERCOCET) 5-325 MG tablet Take 1-2 tablets by mouth daily as needed for severe pain. 12/16/18  Yes Leandrew Koyanagi, MD  Phenazopyridine HCl (AZO URINARY PAIN PO) Take 2 tablets by mouth daily as needed (urinary discomfort).   Yes [provider]  polyethylene glycol (MIRALAX / GLYCOLAX) packet Take 17 g by mouth daily as needed for mild constipation.    Yes [provider]  Potassium 99 MG TABS Take 99-198 mg by mouth See admin instructions. Take 198 mg in the morning and 99 mg at night   Yes [provider]  pravastatin (PRAVACHOL) 40 MG tablet Take 1 tablet (40 mg total) by mouth daily. Patient taking differently: Take 40 mg by mouth at bedtime.  08/29/18  Yes Wendie Agreste, MD  senna-docusate (SENOKOT S) 8.6-50 MG tablet Take 1-2 tablets by mouth at bedtime as needed. 11/10/18  Yes Leandrew Koyanagi, MD  sodium chloride (OCEAN) 0.65 % SOLN nasal spray Place 1 spray into both nostrils as needed for congestion.   Yes [provider]  aspirin EC 81 MG tablet Take 1 tablet (81 mg total) by mouth 2 (two) times daily. Patient not taking: Reported on 08/31/2019 11/10/18   Leandrew Koyanagi, MD  azithromycin (ZITHROMAX Z-PAK) 250 MG tablet As directed Patient not taking: Reported on 08/31/2019 06/06/19   Chevis Pretty, FNP  benzonatate (TESSALON PERLES) 100 MG capsule Take 1 capsule (100 mg total) by mouth 3 (three)  times daily as needed for cough. Patient not taking: Reported on 08/31/2019 06/06/19   Chevis Pretty, FNP  fluticasone [facility]) 50 MCG/ACT nasal spray Place 2 sprays into both nostrils daily for 10 days. 04/03/19 04/13/19  Kara Dies, NP  HYDROcodone-acetaminophen (NORCO) 5-325 MG tablet Take 1-2 tablets by mouth 2 (two) times daily as needed for moderate pain. Patient not taking: Reported on 02/19/2019 11/25/18   Aundra Dubin, PA-C  HYDROcodone-homatropine [facility]) 5-1.5 MG/5ML syrup Take 5 mLs by mouth at bedtime as needed for cough. Patient not taking: Reported on 08/31/2019 02/19/19   Horald Pollen, MD  Liniments [facility] PAIN RELIEF PATCH EX) Place 1 patch onto the skin daily as needed (pain.).    [provider]  loratadine (CLARITIN) 10 MG tablet Take 10 mg by mouth daily as needed for allergies.    [provider]  methocarbamol (ROBAXIN) 750 MG tablet Take 1 tablet (750 mg total) by mouth 2 (two) times daily as needed for muscle spasms. Patient not taking: Reported on 02/19/2019 11/10/18   Leandrew Koyanagi, MD  ondansetron (ZOFRAN) 4 MG tablet Take 1-2 tablets (4-8 mg total) by mouth every 8 (eight) hours as needed for nausea or vomiting. Patient not taking: Reported on 08/31/2019 11/10/18   Leandrew Koyanagi, MD  oxyCODONE (OXY IR/ROXICODONE) 5 MG immediate release tablet Take 1-3 tablets (5-15 mg total) by mouth 3 (three) times daily as needed. Patient not taking: Reported on 02/19/2019 11/10/18   Leandrew Koyanagi, MD  promethazine (PHENERGAN) 25 MG tablet Take 1 tablet (25 mg total) by mouth every 6 (six) hours as needed for nausea. Patient not taking: Reported on 02/19/2019 11/10/18   Leandrew Koyanagi, MD  Tetrahydrozoline HCl (VISINE OP) Place 1 drop into both eyes daily as needed (dry eyes).    [provider]  calcium carbonate 200 MG capsule TWICE WEEKLY  08/29/11  [provider]  ranitidine (ZANTAC) 150 MG capsule Take 150 mg  by mouth every  evening.  08/29/11  [provider]   Social History   Socioeconomic History  . Marital status: Divorced    Spouse name: Not on file  . Number of children: 3  . Years of education: Not on file  . Highest education level: Not on file  Occupational History  . 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 Smoking status: Never Smoker  . Smokeless tobacco: Never Used  Substance and Sexual Activity  . Alcohol use: No  . Drug use: No  . Sexual activity: Not Currently  Other Topics Concern  . Not on file  Social History Narrative   Lives with daughter and grandkids.   Social Determinants of Health   Financial Resource Strain:   . Difficulty of Paying Living Expenses:   Food Insecurity:   . Worried About Charity fundraiser in the Last Year:   . Arboriculturist in the Last Year:   Transportation Needs:   . Film/video editor (Medical):   Marland Kitchen Lack of Transportation (Non-Medical):   Physical Activity:   . Days of Exercise per Week:   . Minutes of Exercise per Session:   Stress:   . Feeling of Stress :   Social Connections:   . Frequency of Communication with Friends and Family:   . Frequency of Social Gatherings with Friends and Family:   . Attends Religious Services:   . Active Member of Clubs or Organizations:   . Attends Archivist Meetings:   Marland Kitchen Marital Status:   Intimate Partner Violence:   . Fear of Current or Ex-Partner:   . Emotionally Abused:   Marland Kitchen Physically Abused:   . Sexually Abused:     Review of Systems   Objective:   Vitals:   08/31/19 1320  BP: 118/69  Pulse: 95  Temp: 98.6 F (37 C)  TempSrc: Temporal  SpO2: 99%  Weight: 208 lb (94.3 kg)  Height: 5\' 7"  (1.702 m)     Physical Exam Exam conducted with a chaperone present.  Constitutional:      Appearance: She is well-developed.  HENT:     Head: Normocephalic and atraumatic.     Right Ear:  External ear normal.     Left Ear: External ear normal.  Eyes:     Conjunctiva/sclera: Conjunctivae normal.     Pupils: Pupils are equal, round, and reactive to light.  Neck:     Thyroid: No thyromegaly.  Cardiovascular:     Rate and Rhythm: Normal rate and regular rhythm.     Heart sounds: Normal heart sounds. No murmur.  Pulmonary:     Effort: Pulmonary effort is normal. No respiratory distress.     Breath sounds: Normal breath sounds. No wheezing.  Abdominal:     General: Bowel sounds are normal.     Palpations: Abdomen is soft.     Tenderness: There is no abdominal tenderness.  Genitourinary:    Exam position: Lithotomy position.     Pubic Area: No rash (few small skin tags. ).      Labia:        Right: No rash, tenderness, lesion or injury.        Left: No rash, tenderness, lesion or injury.      Vagina: Normal.     Uterus: Absent.      Adnexa: Right adnexa normal and  left adnexa normal.       Right: No mass or tenderness.         Left: No mass or tenderness.       Comments: Noah Delaine present.  Musculoskeletal:        General: No tenderness. Normal range of motion.     Cervical back: Normal range of motion and neck supple.  Lymphadenopathy:     Cervical: No cervical adenopathy.  Skin:    General: Skin is warm and dry.     Findings: No rash.       Neurological:     Mental Status: She is alert and oriented to person, place, and time.  Psychiatric:        Behavior: Behavior normal.        Thought Content: Thought content normal.        Assessment & Plan:  Rakisha Hee is a 54 y.o. female . Annual physical exam  - -anticipatory guidance as below in AVS, screening labs above. Health maintenance items as above in HPI discussed/recommended as applicable.   -Immunizations including COVID-19 vaccine, Shingrix were discussed, declined at this time.  Essential hypertension - Plan: Comprehensive metabolic panel, hydrochlorothiazide (HYDRODIURIL) 25 MG tablet Edema,  unspecified type - Plan: hydrochlorothiazide (HYDRODIURIL) 25 MG tablet   --Stable, continue HCTZ same dose  Need for hepatitis C screening test - Plan: Hepatitis C antibody  Hyperlipidemia, unspecified hyperlipidemia type - Plan: Lipid panel, pravastatin (PRAVACHOL) 40 MG tablet  -Tolerating pravastatin, continue same, lipids pending.  Type 2 diabetes mellitus without complication, without long-term current use of insulin (HCC) - Plan: Hemoglobin A1c, Microalbumin / creatinine urine ratio  -Followed by endocrinology, will check A1c, coordinate care with her endocrinologist.  Skin lesion of left leg - Plan: Ambulatory referral to Dermatology  -Possible dermatofibroma.  No concerning features.  Will refer to dermatology for second opinion.  Meds ordered this encounter  Medications  . hydrochlorothiazide (HYDRODIURIL) 25 MG tablet    Sig: Take 1 tablet (25 mg total) by mouth daily.    Dispense:  90 tablet    Refill:  3  . pravastatin (PRAVACHOL) 40 MG tablet    Sig: Take 1 tablet (40 mg total) by mouth at bedtime.    Dispense:  90 tablet    Refill:  3   Patient Instructions    If you would like the shingles vaccine, that can be requested from your pharmacy.   I do recommend the covid vaccine, please let me know if there are questions.  Here is a link to information about the COVID-19 vaccine: [employer] COVID-19 Vaccine Information can be found at: ShippingScam.co.uk For questions related to vaccine distribution or appointments, please email [email or web address] or call [phone].   I will refer you to dermatology to evaluate the area on your leg.   No med changes at this time.   I will check A1c and other labs. Please let me know if there are questions. Thanks for coming in today.   Keeping You Healthy  Get These Tests  Blood Pressure- Have your blood pressure checked by your  healthcare provider at least once a year.  Normal blood pressure is 120/80.  Weight- Have your body mass index (BMI) calculated to screen for obesity.  BMI is a measure of body fat based on height and weight.  You can calculate your own BMI at GravelBags.it  Cholesterol- Have your cholesterol checked every year.  Diabetes- Have your blood sugar checked every year if you have  high blood pressure, high cholesterol, a family history of diabetes or if you are overweight.  Pap Test - Have a pap test every 1 to 5 years if you have been sexually active.  If you are older than 65 and recent pap tests have been normal you may not need additional pap tests.  In addition, if you have had a hysterectomy  for benign disease additional pap tests are not necessary.  Mammogram-Yearly mammograms are essential for early detection of breast cancer  Screening for Colon Cancer- Colonoscopy starting at age 81. Screening may begin sooner depending on your family history and other health conditions.  Follow up colonoscopy as directed by your Gastroenterologist.  Screening for Osteoporosis- Screening begins at age 70 with bone density scanning, sooner if you are at higher risk for developing Osteoporosis.  Get these medicines  Calcium with Vitamin D- Your body requires 1200-1500 mg of Calcium a day and [phone] IU of Vitamin D a day.  You can only absorb 500 mg of Calcium at a time therefore Calcium must be taken in 2 or 3 separate doses throughout the day.  Hormones- Hormone therapy has been associated with increased risk for certain cancers and heart disease.  Talk to your healthcare provider about if you need relief from menopausal symptoms.  Aspirin- Ask your healthcare provider about taking Aspirin to prevent Heart Disease and Stroke.  Get these Immuniztions  Flu shot- Every fall  Pneumonia shot- Once after the age of 36; if you are younger ask your healthcare provider if you need a pneumonia  shot.  Tetanus- Every ten years.  Zostavax- Once after the age of 25 to prevent shingles.  Take these steps  Don't smoke- Your healthcare provider can help you quit. For tips on how to quit, ask your healthcare provider or go to www.smokefree.gov or call 1-800 QUIT-NOW.  Be physically active- Exercise 5 days a week for a minimum of 30 minutes.  If you are not already physically active, start slow and gradually work up to 30 minutes of moderate physical activity.  Try walking, dancing, bike riding, swimming, etc.  Eat a healthy diet- Eat a variety of healthy foods such as fruits, vegetables, whole grains, low fat milk, low fat cheeses, yogurt, lean meats, chicken, fish, eggs, dried beans, tofu, etc.  For more information go to www.thenutritionsource.org  Dental visit- Brush and floss teeth twice daily; visit your dentist twice a year.  Eye exam- Visit your Optometrist or Ophthalmologist yearly.  Drink alcohol in moderation- Limit alcohol intake to one drink or less a day.  Never drink and drive.  Depression- Your emotional health is as important as your physical health.  If you're feeling down or losing interest in things you normally enjoy, please talk to your healthcare provider.  Seat Belts- can save your life; always wear one  Smoke/Carbon Monoxide detectors- These detectors need to be installed on the appropriate level of your home.  Replace batteries at least once a year.  Violence- If anyone is threatening or hurting you, please tell your healthcare provider.  Living Will/ Health care power of attorney- Discuss with your healthcare provider and family.   If you have lab work done today you will be contacted with your lab results within the next 2 weeks.  If you have not heard from [place] then please contact us. The fastest way to get your results is to register for My Chart.   IF you received an x-ray today, you will receive an  invoice from [facility]. Please contact  [facility] at [phone] with questions or concerns regarding your invoice.   IF you received labwork today, you will receive an invoice from [facility]. Please contact [facility] at [phone] with questions or concerns regarding your invoice.   Our billing staff will not be able to assist you with questions regarding bills from these companies.  You will be contacted with the lab results as soon as they are available. The fastest way to get your results is to activate your My Chart account. Instructions are located on the last page of this paperwork. If you have not heard from [place] regarding the results in 2 weeks, please contact this office.         Signed, [name], MD [facility]

## 2019-09-01 ENCOUNTER — Encounter: Payer: Self-pay | Admitting: Family Medicine

## 2019-09-01 LAB — LIPID PANEL
Chol/HDL Ratio: 4.5 ratio — ABNORMAL HIGH (ref 0.0–4.4)
Cholesterol, Total: 190 mg/dL (ref 100–199)
HDL: 42 mg/dL (ref >39)
LDL Chol Calc (NIH): 96 mg/dL (ref 0–99)
Triglycerides: 307 mg/dL — ABNORMAL HIGH (ref 0–149)
VLDL Cholesterol Cal: 52 mg/dL — ABNORMAL HIGH (ref 5–40)

## 2019-09-01 LAB — COMPREHENSIVE METABOLIC PANEL
ALT: 38 IU/L — ABNORMAL HIGH (ref 0–32)
AST: 51 IU/L — ABNORMAL HIGH (ref 0–40)
Albumin/Globulin Ratio: 1.8 (ref 1.2–2.2)
Albumin: 4.7 g/dL (ref 3.8–4.9)
Alkaline Phosphatase: 117 IU/L (ref 39–117)
BUN/Creatinine Ratio: 15 (ref 9–23)
BUN: 13 mg/dL (ref 6–24)
Bilirubin Total: 0.2 mg/dL (ref 0.0–1.2)
CO2: 23 mmol/L (ref 20–29)
Calcium: 9.9 mg/dL (ref 8.7–10.2)
Chloride: 102 mmol/L (ref 96–106)
Creatinine, Ser: 0.87 mg/dL (ref 0.57–1.00)
GFR calc Af Amer: 87 mL/min/{1.73_m2} (ref >59)
GFR calc non Af Amer: 75 mL/min/{1.73_m2} (ref >59)
Globulin, Total: 2.6 g/dL (ref 1.5–4.5)
Glucose: 104 mg/dL — ABNORMAL HIGH (ref 65–99)
Potassium: 4.4 mmol/L (ref 3.5–5.2)
Sodium: 140 mmol/L (ref 134–144)
Total Protein: 7.3 g/dL (ref 6.0–8.5)

## 2019-09-01 LAB — MICROALBUMIN / CREATININE URINE RATIO
Creatinine, Urine: 186.8 mg/dL
Microalb/Creat Ratio: 7 mg/g creat (ref 0–29)
Microalbumin, Urine: 13.6 ug/mL

## 2019-09-01 LAB — HEPATITIS C ANTIBODY: Hep C Virus Ab: <0.1 s/co ratio (ref 0.0–0.9)

## 2019-09-01 LAB — HEMOGLOBIN A1C
Est. average glucose Bld gHb Est-mCnc: 163 mg/dL
Hgb A1c MFr Bld: 7.3 % — ABNORMAL HIGH (ref 4.8–5.6)

## 2019-10-20 ENCOUNTER — Other Ambulatory Visit: Payer: Self-pay | Admitting: Family Medicine

## 2019-10-20 DIAGNOSIS — K219 Gastro-esophageal reflux disease without esophagitis: Secondary | ICD-10-CM

## 2019-10-20 DIAGNOSIS — I10 Essential (primary) hypertension: Secondary | ICD-10-CM

## 2019-10-20 DIAGNOSIS — R609 Edema, unspecified: Secondary | ICD-10-CM

## 2019-10-22 ENCOUNTER — Telehealth: Payer: BC Managed Care – PPO | Admitting: Family

## 2019-10-22 DIAGNOSIS — B9689 Other specified bacterial agents as the cause of diseases classified elsewhere: Secondary | ICD-10-CM | POA: Diagnosis not present

## 2019-10-22 DIAGNOSIS — J019 Acute sinusitis, unspecified: Secondary | ICD-10-CM | POA: Diagnosis not present

## 2019-10-22 NOTE — Progress Notes (Signed)

## 2019-10-23 ENCOUNTER — Encounter: Payer: Self-pay | Admitting: Family Medicine

## 2019-10-23 MED ORDER — AMOXICILLIN-POT CLAVULANATE 875-125 MG PO TABS: 1.0000 | ORAL_TABLET | Freq: Two times a day (BID) | ORAL | 0 refills | Status: DC

## 2019-10-23 NOTE — Telephone Encounter (Signed)
This patient was seen in your E-Visit clinic yesterday. Please Advise on message below.

## 2019-10-27 ENCOUNTER — Ambulatory Visit: Payer: Self-pay

## 2019-10-27 ENCOUNTER — Other Ambulatory Visit: Payer: Self-pay

## 2019-10-27 ENCOUNTER — Encounter: Payer: Self-pay | Admitting: Orthopaedic Surgery

## 2019-10-27 ENCOUNTER — Ambulatory Visit: Payer: BC Managed Care – PPO | Admitting: Orthopaedic Surgery

## 2019-10-27 DIAGNOSIS — Z96642 Presence of left artificial hip joint: Secondary | ICD-10-CM | POA: Diagnosis not present

## 2019-10-27 NOTE — Progress Notes (Signed)
Office Visit Note   Patient: Angelica Sharp           Date of Birth: 1965-09-03           MRN: [id number] Visit Date: 10/27/2019              Requested by: Wendie Agreste, MD [address] [place],  [place] [address] PCP: Wendie Agreste, MD   Assessment & Plan: Visit Diagnoses:  1. Status post total hip replacement, left     Plan: Impression is 1 year status post left total hip replacement.  Angelica Sharp has done very well overall.  Dental prophylaxis was reinforced today.  Recheck in another year with standing AP pelvis and lateral hip.  Follow-Up Instructions: Return in about 1 year (around 10/26/2020).   Orders:  Orders Placed This Encounter  Procedures  . XR HIP UNILAT W OR W/O PELVIS 2-3 VIEWS LEFT   No orders of the defined types were placed in this encounter.     Procedures: No procedures performed   Clinical Data: No additional findings.   Subjective: Chief Complaint  Patient presents with  . Left Hip - Pain    Angelica Sharp is a 54 year old female who is now 1 year status post left total hip replacement.  She is doing well overall.  Occasionally she has some groin pains or wheN she bends forward she has some low back pain.  She is walking for exercise.  Not taking any medications and overall she is very happy with her outcome.   Review of Systems   Objective: Vital Signs: LMP 08/10/2012 Comment: ovaries remain//a.c.  Physical Exam  Ortho Exam Left hip shows a fully healed surgical scar.  Great range of motion without pain.  Normal strength. Specialty Comments:  No specialty comments available.  Imaging: XR HIP UNILAT W OR W/O PELVIS 2-3 VIEWS LEFT  Result Date: 10/27/2019 Stable total hip replacement without complication    PMFS History: Patient Active Problem List   Diagnosis Date Noted  . Status post total replacement of left hip 11/10/2018  . Primary osteoarthritis of left hip 08/22/2018  . Essential hypertension 11/25/2017  . Foot pain  11/23/2017  . Chronic joint pain 03/29/2015  . DM II (diabetes mellitus, type II), controlled ([place]) 10/01/2014  . Obesity 03/12/2014  . 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 Hyperlipidemia 09/10/2013  . Family history of cancer 08/30/2012  . GERD (gastroesophageal reflux disease) 07/03/2011   Past Medical History:  Diagnosis Date  . Allergy   . Anemia   . Anxiety   . Arthritis   . Borderline hypertension   . Chest pain   . Complication of anesthesia   . Degenerative lumbar disc    L5S1  . Diabetes mellitus without complication ([place])   . Diverticulitis   . Dyspnea   . Edema    lower extremity  . Family history of anesthesia complication    sister had BP complications during anes.  . Fatty liver   . GERD (gastroesophageal reflux disease)   . H/O hiatal hernia   . Headache   . Hypertension   . Pneumonia   . PONV (postoperative nausea and vomiting)   . SOB (shortness of breath)     Family History  Problem Relation Age of Onset  . Heart attack Mother   . Diabetes Mother   . Hyperlipidemia Mother   . Hypertension Mother   . Heart attack Sister   . Pulmonary embolism Father   . Clotting  disorder Father   . Diabetes Sister   . Kidney disease Sister        kidney failure  . Heart disease Sister   . Cancer Sister   . Diabetes Son 52       TYPE 1    Past Surgical History:  Procedure Laterality Date  . ABDOMINAL HYSTERECTOMY    . BREAST LUMPECTOMY     3 lumps -- two from right -- one from left  . CESAREAN SECTION    . LAPAROSCOPIC ASSISTED VAGINAL HYSTERECTOMY N/A 09/18/2012   Procedure: LAPAROSCOPIC ASSISTED VAGINAL HYSTERECTOMY;  Surgeon: Linda Hedges, DO;  Location: [facility];  Service: Gynecology;  Laterality: N/A;  BLADDER  DISTENTION  . TOTAL HIP ARTHROPLASTY Left 11/10/2018   Procedure: LEFT TOTAL HIP ARTHROPLASTY ANTERIOR APPROACH;  Surgeon: Leandrew Koyanagi, MD;  Location: [facility];  Service: Orthopedics;  Laterality: Left;  . TUBAL LIGATION     Social History     Occupational History  . 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 Smoking status: Never Smoker  . Smokeless tobacco: Never Used  Substance and Sexual Activity  . Alcohol use: No  . Drug use: No  . Sexual activity: Not Currently

## 2019-11-06 ENCOUNTER — Telehealth: Payer: BC Managed Care – PPO | Admitting: Physician Assistant

## 2019-11-06 DIAGNOSIS — R1032 Left lower quadrant pain: Secondary | ICD-10-CM

## 2019-11-06 DIAGNOSIS — M545 Low back pain, unspecified: Secondary | ICD-10-CM

## 2019-11-06 NOTE — Progress Notes (Signed)
Based on what you shared with me, I feel your condition warrants further evaluation and I recommend that you be seen for a face to face office visit.  Given the information that you have provided, I agree that your symptoms are more likely to be diverticulitis.  However, I have a history of diabetes and a previous history of diverticulitis it would be best for you to be evaluated in person with a set of vitals and abdominal exam to ensure no other complicating factors or red flags.  You may make a same-day appointment with your primary care, present to any of the below listed urgent cares or the emergency department if your symptoms worsen.   NOTE: If you entered your credit card information for this eVisit, you will not be charged. You may see a "hold" on your card for the $35 but that hold will drop off and you will not have a charge processed.   If you are having a true medical emergency please call 911.      For an urgent face to face visit, [facility] has five urgent care centers for your convenience:      NEW:  [facility] Urgent [place] [facility] [place] Get Driving Directions [id number] [address] [place], [place] [address] . 10 am - 6pm Monday - Friday    [facility]) Get Driving Directions [phone] [address] [place], [place] [address] . 10 am to 8 pm Monday-Friday . 12 pm to 8 pm [facility] Care at [facility] Get Driving Directions [id number] [place], [place], [place] [address] . 8 am to 8 pm Monday-Friday . 9 am to 6 pm Saturday . 11 am to 6 pm Sunday     [facility] at [facility] Get Driving Directions  [id number] [address] [place], [place] [address] . 8 am to 8 pm Monday-Friday . 8 am to 4 pm [facility] Urgent Care at [place] Get Driving Directions [id number] [address] [place],  [address] . 12 pm to 6 pm Monday-Friday      Your e-visit answers were reviewed by a board certified advanced clinical practitioner to complete your personal care plan.  Thank you for using e-Visits.   Greater than 5 minutes, yet less than 10 minutes of time have been spent researching, coordinating, and implementing care for this patient today

## 2019-11-07 DIAGNOSIS — N390 Urinary tract infection, site not specified: Secondary | ICD-10-CM | POA: Diagnosis not present

## 2019-11-10 ENCOUNTER — Encounter: Payer: Self-pay | Admitting: Registered Nurse

## 2019-11-10 ENCOUNTER — Other Ambulatory Visit: Payer: Self-pay

## 2019-11-10 ENCOUNTER — Ambulatory Visit: Payer: BC Managed Care – PPO | Admitting: Registered Nurse

## 2019-11-10 VITALS — BP 161/73 | HR 71 | Temp 97.6°F | Ht 67.0 in | Wt 209.8 lb

## 2019-11-10 DIAGNOSIS — R1030 Lower abdominal pain, unspecified: Secondary | ICD-10-CM | POA: Diagnosis not present

## 2019-11-10 DIAGNOSIS — N3000 Acute cystitis without hematuria: Secondary | ICD-10-CM | POA: Diagnosis not present

## 2019-11-10 LAB — POCT URINALYSIS DIP (MANUAL ENTRY)
Bilirubin, UA: NEGATIVE
Blood, UA: NEGATIVE
Glucose, UA: NEGATIVE mg/dL
Ketones, POC UA: NEGATIVE mg/dL
Nitrite, UA: POSITIVE — AB
Protein Ur, POC: NEGATIVE mg/dL
Spec Grav, UA: 1.02 (ref 1.010–1.025)
Urobilinogen, UA: 0.2 E.U./dL
pH, UA: 5.5 (ref 5.0–8.0)

## 2019-11-10 MED ORDER — NITROFURANTOIN MONOHYD MACRO 100 MG PO CAPS: 100.0000 mg | ORAL_CAPSULE | Freq: Two times a day (BID) | ORAL | 0 refills | Status: AC

## 2019-11-10 MED ORDER — PHENAZOPYRIDINE HCL 95 MG PO TABS: 95.0000 mg | ORAL_TABLET | Freq: Three times a day (TID) | ORAL | 0 refills | Status: DC | PRN

## 2019-11-10 NOTE — Patient Instructions (Signed)
° ° ° °  If you have lab work done today you will be contacted with your lab results within the next 2 weeks.  If you have not heard from us then please contact us. The fastest way to get your results is to register for My Chart. ° ° °IF you received an x-ray today, you will receive an invoice from [facility]. Please contact [facility] at [phone] with questions or concerns regarding your invoice.  ° °IF you received labwork today, you will receive an invoice from [facility]. Please contact [facility] at [phone] with questions or concerns regarding your invoice.  ° °Our billing staff will not be able to assist you with questions regarding bills from these companies. ° °You will be contacted with the lab results as soon as they are available. The fastest way to get your results is to activate your My Chart account. Instructions are located on the last page of this paperwork. If you have not heard from us regarding the results in 2 weeks, please contact this office. °  ° ° ° °

## 2019-11-10 NOTE — Progress Notes (Signed)
Acute Office Visit  Subjective:    Patient ID: Angelica Sharp, female    DOB: Jan 15, 1966, 54 y.o.   MRN: [id number]  Chief Complaint  Patient presents with  . Back Pain    Pt stated that she has been experiencing some abdominal/back pain since 11/05/2019    HPI Patient is in today for lower midline abdominal pain and flank pain.  Started 11/05/19. Had virtual visit - could not be treated without labs Was seen at urgent care on Saturday, POCT UA showed UTI, given cipro, finished course with no improvement Still having some dysuria, frequency, and urgency. Flank pain and suprapubic pain present No nvd, chills, fevers, sweats.  No other concerns at this time. She does not suspect diverticulitis.  Past Medical History:  Diagnosis Date  . Allergy   . Anemia   . Anxiety   . Arthritis   . Borderline hypertension   . Chest pain   . Complication of anesthesia   . Degenerative lumbar disc    L5S1  . Diabetes mellitus without complication ([place])   . Diverticulitis   . Dyspnea   . Edema    lower extremity  . Family history of anesthesia complication    sister had BP complications during anes.  . Fatty liver   . GERD (gastroesophageal reflux disease)   . H/O hiatal hernia   . Headache   . Hypertension   . Pneumonia   . PONV (postoperative nausea and vomiting)   . SOB (shortness of breath)     Past Surgical History:  Procedure Laterality Date  . ABDOMINAL HYSTERECTOMY    . BREAST LUMPECTOMY     3 lumps -- two from right -- one from left  . CESAREAN SECTION    . LAPAROSCOPIC ASSISTED VAGINAL HYSTERECTOMY N/A 09/18/2012   Procedure: LAPAROSCOPIC ASSISTED VAGINAL HYSTERECTOMY;  Surgeon: Linda Hedges, DO;  Location: [facility];  Service: Gynecology;  Laterality: N/A;  BLADDER  DISTENTION  . TOTAL HIP ARTHROPLASTY Left 11/10/2018   Procedure: LEFT TOTAL HIP ARTHROPLASTY ANTERIOR APPROACH;  Surgeon: Leandrew Koyanagi, MD;  Location: [facility];  Service: Orthopedics;  Laterality: Left;  . TUBAL  LIGATION      Family History  Problem Relation Age of Onset  . Heart attack Mother   . Diabetes Mother   . Hyperlipidemia Mother   . Hypertension Mother   . Heart attack Sister   . Pulmonary embolism Father   . Clotting disorder Father   . Diabetes Sister   . Kidney disease Sister        kidney failure  . Heart disease Sister   . Cancer Sister   . Diabetes Son 55       TYPE 1    Social History   Socioeconomic History  . Marital status: Divorced    Spouse name: Not on file  . Number of children: 3  . Years of education: Not on file  . Highest education level: Not on file  Occupational History  . 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 Smoking status: Never Smoker  . Smokeless tobacco: Never Used  Substance and Sexual Activity  . Alcohol use: No  . Drug use: No  . Sexual activity: Not Currently  Other Topics Concern  . Not on file  Social History Narrative   Lives with daughter and grandkids.   Social Determinants of Health  Financial Resource Strain:   . Difficulty of Paying Living Expenses:   Food Insecurity:   . Worried About Charity fundraiser in the Last Year:   . Arboriculturist in the Last Year:   Transportation Needs:   . Film/video editor (Medical):   Marland Kitchen Lack of Transportation (Non-Medical):   Physical Activity:   . Days of Exercise per Week:   . Minutes of Exercise per Session:   Stress:   . Feeling of Stress :   Social Connections:   . Frequency of Communication with Friends and Family:   . Frequency of Social Gatherings with Friends and Family:   . Attends Religious Services:   . Active Member of Clubs or Organizations:   . Attends Archivist Meetings:   Marland Kitchen Marital Status:   Intimate Partner Violence:   . Fear of Current or Ex-Partner:   . Emotionally Abused:   Marland Kitchen Physically Abused:   . Sexually Abused:     Outpatient Medications Prior to  Visit  Medication Sig Dispense Refill  . Calcium Carb-Cholecalciferol (CALCIUM+D3 PO) Take 1 tablet by mouth daily.    Marland Kitchen esomeprazole (NEXIUM) 40 MG capsule Take 1 capsule by mouth once daily 90 capsule 0  . glucose blood test strip Use as instructed 100 each 3  . hydrochlorothiazide (HYDRODIURIL) 25 MG tablet Take 1 tablet by mouth once daily 90 tablet 0  . loperamide (IMODIUM A-D) 2 MG tablet Take 4 mg by mouth 4 (four) times daily as needed for diarrhea or loose stools.    . metFORMIN (GLUCOPHAGE-XR) 750 MG 24 hr tablet Take 750 mg by mouth 2 (two) times daily.     . ondansetron (ZOFRAN-ODT) 4 MG disintegrating tablet Take 4 mg by mouth every 8 (eight) hours as needed for nausea or vomiting.    Marland Kitchen Phenazopyridine HCl (AZO URINARY PAIN PO) Take 2 tablets by mouth daily as needed (urinary discomfort).    . polyethylene glycol (MIRALAX / GLYCOLAX) packet Take 17 g by mouth daily as needed for mild constipation.     . Potassium 99 MG TABS Take 99-198 mg by mouth See admin instructions. Take 198 mg in the morning and 99 mg at night    . pravastatin (PRAVACHOL) 40 MG tablet Take 1 tablet (40 mg total) by mouth at bedtime. 90 tablet 3  . fluticasone (FLONASE) 50 MCG/ACT nasal spray Place 2 sprays into both nostrils daily for 10 days. 16 g 0  . amoxicillin-clavulanate (AUGMENTIN) 875-125 MG tablet Take 1 tablet by mouth 2 (two) times daily. 20 tablet 0   No facility-administered medications prior to visit.    Allergies  Allergen Reactions  . Morphine And Related Other (See Comments)    Headache and hallucinations  . Septra [Sulfamethoxazole-Trimethoprim] Other (See Comments)    TACHYCARDIA    Review of Systems Per hpi, otherwise negative    Objective:    Physical Exam Constitutional:      Appearance: Normal appearance.  HENT:     Head: Normocephalic.     Mouth/Throat:     Mouth: Mucous membranes are moist.     Pharynx: Oropharynx is clear.  Cardiovascular:     Rate and Rhythm:  Normal rate and regular rhythm.  Pulmonary:     Effort: Pulmonary effort is normal. No respiratory distress.  Abdominal:     General: Abdomen is flat. Bowel sounds are normal. There is no distension.  Musculoskeletal:        General: No  swelling. Normal range of motion.  Skin:    General: Skin is warm and dry.     Capillary Refill: Capillary refill takes less than 2 seconds.     Coloration: Skin is not jaundiced or pale.     Findings: No bruising, erythema, lesion or rash.  Neurological:     General: No focal deficit present.     Mental Status: She is alert and oriented to person, place, and time. Mental status is at baseline.  Psychiatric:        Mood and Affect: Mood normal.        Behavior: Behavior normal.        Thought Content: Thought content normal.        Judgment: Judgment normal.     BP (!) 161/73 (BP Location: Right Arm, Patient Position: Sitting, Cuff Size: Normal)   Pulse 71   Temp 97.6 F (36.4 C) (Temporal)   Ht 5\' 7"  (1.702 m)   Wt 209 lb 12.8 oz (95.2 kg)   LMP 08/10/2012 Comment: ovaries remain//a.c.  SpO2 96%   BMI 32.86 kg/m  Wt Readings from Last 3 Encounters:  11/10/19 209 lb 12.8 oz (95.2 kg)  08/31/19 208 lb (94.3 kg)  02/19/19 210 lb (95.3 kg)    Health Maintenance Due  Topic Date Due  . COVID-19 Vaccine (1) Never done  . FOOT EXAM  08/29/2019  . OPHTHALMOLOGY EXAM  11/10/2019    There are no preventive care reminders to display for this patient.   Lab Results  Component Value Date   TSH 1.410 08/28/2018   Lab Results  Component Value Date   WBC 10.9 (H) 11/12/2018   HGB 8.0 (L) 11/12/2018   HCT 26.2 (L) 11/12/2018   MCV 78.2 (L) 11/12/2018   PLT 230 11/12/2018   Lab Results  Component Value Date   NA 140 08/31/2019   K 4.4 08/31/2019   CO2 23 08/31/2019   GLUCOSE 104 (H) 08/31/2019   BUN 13 08/31/2019   CREATININE 0.87 08/31/2019   BILITOT 0.2 08/31/2019   ALKPHOS 117 08/31/2019   AST 51 (H) 08/31/2019   ALT 38 (H)  08/31/2019   PROT 7.3 08/31/2019   ALBUMIN 4.7 08/31/2019   CALCIUM 9.9 08/31/2019   ANIONGAP 10 11/11/2018   Lab Results  Component Value Date   CHOL 190 08/31/2019   Lab Results  Component Value Date   HDL 42 08/31/2019   Lab Results  Component Value Date   LDLCALC 96 08/31/2019   Lab Results  Component Value Date   TRIG 307 (H) 08/31/2019   Lab Results  Component Value Date   CHOLHDL 4.5 (H) 08/31/2019   Lab Results  Component Value Date   HGBA1C 7.3 (H) 08/31/2019       Assessment & Plan:   Problem List Items Addressed This Visit    None    Visit Diagnoses    Lower abdominal pain    -  Primary   Relevant Medications   phenazopyridine (PYRIDIUM) 95 MG tablet   Other Relevant Orders   POCT urinalysis dipstick (Completed)   Acute cystitis without hematuria       Relevant Medications   nitrofurantoin, macrocrystal-monohydrate, (MACROBID) 100 MG capsule   Other Relevant Orders   Urine Culture       Meds ordered this encounter  Medications  . nitrofurantoin, macrocrystal-monohydrate, (MACROBID) 100 MG capsule    Sig: Take 1 capsule (100 mg total) by mouth 2 (two) times daily for  5 days.    Dispense:  10 capsule    Refill:  0    Order Specific Question:   Supervising Provider    Answer:   Carlota Raspberry, JEFFREY R [[id number]]  . phenazopyridine (PYRIDIUM) 95 MG tablet    Sig: Take 1 tablet (95 mg total) by mouth 3 (three) times daily as needed for pain.    Dispense:  10 tablet    Refill:  0    Order Specific Question:   Supervising Provider    Answer:   Carlota Raspberry, JEFFREY R [[id number]]   PLAN  macrobid and azo  poct shows ongoing uti  Culture sent  Return precautions reviewed  Patient encouraged to call clinic with any questions, comments, or concerns.  Maximiano Coss, NP

## 2019-11-12 LAB — URINE CULTURE

## 2019-11-15 ENCOUNTER — Telehealth: Payer: BC Managed Care – PPO | Admitting: Family

## 2019-11-15 DIAGNOSIS — R399 Unspecified symptoms and signs involving the genitourinary system: Secondary | ICD-10-CM

## 2019-11-15 NOTE — Progress Notes (Signed)
Based on what you shared with me, I feel your condition warrants further evaluation and I recommend that you be seen for a face to face office visit.  Given your recurrent symptoms, you need to call your PCP. I reviewed your Urine culture and it was negative for infection. You need a referral to see a Urologists.    NOTE: If you entered your credit card information for this eVisit, you will not be charged. You may see a "hold" on your card for the $35 but that hold will drop off and you will not have a charge processed.   If you are having a true medical emergency please call 911.      For an urgent face to face visit, [facility] has five urgent care centers for your convenience:      NEW:  [facility] Health Urgent [place] Get Driving Directions [phone] [place], [place] [address] . 10 am - 6pm Monday - Friday    [facility]) Get Driving Directions [phone] [address] [place], [place] [address] . 10 am to 8 pm Monday-Friday . 12 pm to 8 pm [facility]gent [facility] at [facility] Get Driving Directions [phone] [place], [place], [place] [address] . 8 am to 8 pm Monday-Friday . 9 am to 6 pm Saturday . 11 am to 6 pm Sunday     [facility] at [facility] Get Driving Directions  [phone] [address] [place], [place] [address] . 8 am to 8 pm Monday-Friday . 8 am to 4 pm [facility] Urgent Care at [place] Get Driving Directions [phone] [address] [place], [place] [address] . 12 pm to 6 pm Monday-Friday      Your e-visit answers were reviewed by a board certified advanced clinical practitioner to complete your personal care plan.  Thank you for using e-Visits.

## 2019-11-16 ENCOUNTER — Other Ambulatory Visit: Payer: Self-pay | Admitting: Registered Nurse

## 2019-11-16 ENCOUNTER — Encounter: Payer: Self-pay | Admitting: Orthopaedic Surgery

## 2019-11-16 ENCOUNTER — Encounter: Payer: Self-pay | Admitting: Family Medicine

## 2019-11-16 DIAGNOSIS — R1084 Generalized abdominal pain: Secondary | ICD-10-CM

## 2019-11-16 NOTE — Telephone Encounter (Signed)
Pt is on the last day of abx and is still having some pain in the lower abdomen and some swelling, pt states she does have hxt of diverticulitis, was treated for UTI via urgent care 5/29 follow up in our office 6/1

## 2019-11-17 ENCOUNTER — Ambulatory Visit (INDEPENDENT_AMBULATORY_CARE_PROVIDER_SITE_OTHER): Payer: BC Managed Care – PPO | Admitting: Registered Nurse

## 2019-11-17 ENCOUNTER — Other Ambulatory Visit: Payer: Self-pay

## 2019-11-17 DIAGNOSIS — R1084 Generalized abdominal pain: Secondary | ICD-10-CM

## 2019-11-18 LAB — CBC WITH DIFFERENTIAL/PLATELET
Basophils Absolute: 0.1 10*3/uL (ref 0.0–0.2)
Basos: 1 %
EOS (ABSOLUTE): 0.2 10*3/uL (ref 0.0–0.4)
Eos: 2 %
Hematocrit: 33.1 % — ABNORMAL LOW (ref 34.0–46.6)
Hemoglobin: 9.5 g/dL — ABNORMAL LOW (ref 11.1–15.9)
Immature Grans (Abs): 0.1 10*3/uL (ref 0.0–0.1)
Immature Granulocytes: 1 %
Lymphocytes Absolute: 1.4 10*3/uL (ref 0.7–3.1)
Lymphs: 15 %
MCH: 19.2 pg — ABNORMAL LOW (ref 26.6–33.0)
MCHC: 28.7 g/dL — ABNORMAL LOW (ref 31.5–35.7)
MCV: 67 fL — ABNORMAL LOW (ref 79–97)
Monocytes Absolute: 0.3 10*3/uL (ref 0.1–0.9)
Monocytes: 4 %
Neutrophils Absolute: 6.9 10*3/uL (ref 1.4–7.0)
Neutrophils: 77 %
Platelets: 401 10*3/uL (ref 150–450)
RBC: 4.94 x10E6/uL (ref 3.77–5.28)
RDW: 16.5 % — ABNORMAL HIGH (ref 11.7–15.4)
WBC: 8.9 10*3/uL (ref 3.4–10.8)

## 2019-11-18 LAB — COMPREHENSIVE METABOLIC PANEL
ALT: 40 IU/L — ABNORMAL HIGH (ref 0–32)
AST: 44 IU/L — ABNORMAL HIGH (ref 0–40)
Albumin/Globulin Ratio: 1.7 (ref 1.2–2.2)
Albumin: 4.5 g/dL (ref 3.8–4.9)
Alkaline Phosphatase: 123 IU/L — ABNORMAL HIGH (ref 48–121)
BUN/Creatinine Ratio: 13 (ref 9–23)
BUN: 12 mg/dL (ref 6–24)
Bilirubin Total: 0.5 mg/dL (ref 0.0–1.2)
CO2: 22 mmol/L (ref 20–29)
Calcium: 9.6 mg/dL (ref 8.7–10.2)
Chloride: 99 mmol/L (ref 96–106)
Creatinine, Ser: 0.92 mg/dL (ref 0.57–1.00)
GFR calc Af Amer: 81 mL/min/{1.73_m2} (ref >59)
GFR calc non Af Amer: 70 mL/min/{1.73_m2} (ref >59)
Globulin, Total: 2.7 g/dL (ref 1.5–4.5)
Glucose: 155 mg/dL — ABNORMAL HIGH (ref 65–99)
Potassium: 4.3 mmol/L (ref 3.5–5.2)
Sodium: 136 mmol/L (ref 134–144)
Total Protein: 7.2 g/dL (ref 6.0–8.5)

## 2019-11-18 LAB — LIPASE: Lipase: 23 U/L (ref 14–72)

## 2019-11-18 LAB — AMYLASE: Amylase: 35 U/L (ref 31–110)

## 2019-11-20 ENCOUNTER — Encounter: Payer: Self-pay | Admitting: Registered Nurse

## 2019-11-24 ENCOUNTER — Encounter: Payer: Self-pay | Admitting: Registered Nurse

## 2019-11-24 ENCOUNTER — Ambulatory Visit: Payer: BC Managed Care – PPO | Admitting: Registered Nurse

## 2019-11-24 ENCOUNTER — Telehealth: Payer: Self-pay | Admitting: Family Medicine

## 2019-11-24 ENCOUNTER — Other Ambulatory Visit: Payer: Self-pay

## 2019-11-24 VITALS — BP 122/61 | HR 89 | Temp 97.4°F | Resp 18 | Ht 67.0 in | Wt 212.6 lb

## 2019-11-24 DIAGNOSIS — R002 Palpitations: Secondary | ICD-10-CM | POA: Diagnosis not present

## 2019-11-24 DIAGNOSIS — E119 Type 2 diabetes mellitus without complications: Secondary | ICD-10-CM | POA: Diagnosis not present

## 2019-11-24 LAB — BASIC METABOLIC PANEL
BUN/Creatinine Ratio: 7 — ABNORMAL LOW (ref 9–23)
BUN: 7 mg/dL (ref 6–24)
CO2: 18 mmol/L — ABNORMAL LOW (ref 20–29)
Calcium: 9 mg/dL (ref 8.7–10.2)
Chloride: 105 mmol/L (ref 96–106)
Creatinine, Ser: 1.04 mg/dL — ABNORMAL HIGH (ref 0.57–1.00)
GFR calc Af Amer: 70 mL/min/{1.73_m2} (ref >59)
GFR calc non Af Amer: 61 mL/min/{1.73_m2} (ref >59)
Glucose: 197 mg/dL — ABNORMAL HIGH (ref 65–99)
Potassium: 4 mmol/L (ref 3.5–5.2)
Sodium: 138 mmol/L (ref 134–144)

## 2019-11-24 LAB — POCT GLYCOSYLATED HEMOGLOBIN (HGB A1C): Hemoglobin A1C: 7.2 % — AB (ref 4.0–5.6)

## 2019-11-24 NOTE — Progress Notes (Signed)
Established Patient Office Visit  Subjective:  Patient ID: Angelica Sharp, female    DOB: 08-25-1965  Age: 54 y.o. MRN: [id number]  CC:  Chief Complaint  Patient presents with  . Diabetes    patient would like to recheck her A1c and also discuss heart pal that started on friday,    HPI Angelica Sharp presents for palpitations.  Onset Friday - feeling like heart has been racing, beating heavy, and occasionally dropping beats. Feels like rhythm has been generally regular but rate is labile. Some lightheadedness, dizziness, but no change to vision, LOC, chest pain, claudications, or dependent edema. No recent change to diet or medication. Has been on hctz for some time - has had hx of hypokalemia but supplements regularly now.   Does have family hx of chf and heart disease in her sister and mother. Both have had adverse cardiovascular events.   Past Medical History:  Diagnosis Date  . Allergy   . Anemia   . Anxiety   . Arthritis   . Borderline hypertension   . Chest pain   . Complication of anesthesia   . Degenerative lumbar disc    [id number]  . Diabetes mellitus without complication ([place])   . Diverticulitis   . Dyspnea   . Edema    lower extremity  . Family history of anesthesia complication    sister had BP complications during anes.  . Fatty liver   . GERD (gastroesophageal reflux disease)   . H/O hiatal hernia   . Headache   . Hypertension   . Pneumonia   . PONV (postoperative nausea and vomiting)   . SOB (shortness of breath)     Past Surgical History:  Procedure Laterality Date  . ABDOMINAL HYSTERECTOMY    . BREAST LUMPECTOMY     3 lumps -- two from right -- one from left  . CESAREAN SECTION    . LAPAROSCOPIC ASSISTED VAGINAL HYSTERECTOMY N/A 09/18/2012   Procedure: LAPAROSCOPIC ASSISTED VAGINAL HYSTERECTOMY;  Surgeon: Linda Hedges, DO;  Location: [facility];  Service: Gynecology;  Laterality: N/A;  BLADDER  DISTENTION  . TOTAL HIP ARTHROPLASTY Left 11/10/2018    Procedure: LEFT TOTAL HIP ARTHROPLASTY ANTERIOR APPROACH;  Surgeon: Leandrew Koyanagi, MD;  Location: [facility];  Service: Orthopedics;  Laterality: Left;  . TUBAL LIGATION      Family History  Problem Relation Age of Onset  . Heart attack Mother   . Diabetes Mother   . Hyperlipidemia Mother   . Hypertension Mother   . Heart attack Sister   . Pulmonary embolism Father   . Clotting disorder Father   . Diabetes Sister   . Kidney disease Sister        kidney failure  . Heart disease Sister   . Cancer Sister   . Diabetes Son 72       TYPE 1    Social History   Socioeconomic History  . Marital status: Divorced    Spouse name: Not on file  . Number of children: 3  . Years of education: Not on file  . Highest education level: Not on file  Occupational History  . 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 Smoking status: Never Smoker  . Smokeless tobacco: Never Used  Vaping Use  . Vaping Use: Never used  Substance and Sexual Activity  . Alcohol use: No  . Drug use:  No  . Sexual activity: Not Currently  Other Topics Concern  . Not on file  Social History Narrative   Lives with daughter and grandkids.   Social Determinants of Health   Financial Resource Strain:   . Difficulty of Paying Living Expenses:   Food Insecurity:   . Worried About Charity fundraiser in the Last Year:   . Arboriculturist in the Last Year:   Transportation Needs:   . Film/video editor (Medical):   Marland Kitchen Lack of Transportation (Non-Medical):   Physical Activity:   . Days of Exercise per Week:   . Minutes of Exercise per Session:   Stress:   . Feeling of Stress :   Social Connections:   . Frequency of Communication with Friends and Family:   . Frequency of Social Gatherings with Friends and Family:   . Attends Religious Services:   . Active Member of Clubs or Organizations:   . Attends Archivist Meetings:    Marland Kitchen Marital Status:   Intimate Partner Violence:   . Fear of Current or Ex-Partner:   . Emotionally Abused:   Marland Kitchen Physically Abused:   . Sexually Abused:     Outpatient Medications Prior to Visit  Medication Sig Dispense Refill  . Calcium Carb-Cholecalciferol (CALCIUM+D3 PO) Take 1 tablet by mouth daily.    Marland Kitchen esomeprazole (NEXIUM) 40 MG capsule Take 1 capsule by mouth once daily 90 capsule 0  . glucose blood test strip Use as instructed 100 each 3  . hydrochlorothiazide (HYDRODIURIL) 25 MG tablet Take 1 tablet by mouth once daily 90 tablet 0  . loperamide (IMODIUM A-D) 2 MG tablet Take 4 mg by mouth 4 (four) times daily as needed for diarrhea or loose stools.    . metFORMIN (GLUCOPHAGE-XR) 750 MG 24 hr tablet Take 750 mg by mouth 2 (two) times daily.     . ondansetron (ZOFRAN-ODT) 4 MG disintegrating tablet Take 4 mg by mouth every 8 (eight) hours as needed for nausea or vomiting.    . phenazopyridine (PYRIDIUM) 95 MG tablet Take 1 tablet (95 mg total) by mouth 3 (three) times daily as needed for pain. 10 tablet 0  . Phenazopyridine HCl (AZO URINARY PAIN PO) Take 2 tablets by mouth daily as needed (urinary discomfort).    . polyethylene glycol (MIRALAX / GLYCOLAX) packet Take 17 g by mouth daily as needed for mild constipation.     . Potassium 99 MG TABS Take 99-198 mg by mouth See admin instructions. Take 198 mg in the morning and 99 mg at night    . fluticasone (FLONASE) 50 MCG/ACT nasal spray Place 2 sprays into both nostrils daily for 10 days. 16 g 0  . pravastatin (PRAVACHOL) 40 MG tablet Take 1 tablet (40 mg total) by mouth at bedtime. (Patient not taking: Reported on 11/24/2019) 90 tablet 3   No facility-administered medications prior to visit.    Allergies  Allergen Reactions  . Morphine And Related Other (See Comments)    Headache and hallucinations  . Septra [Sulfamethoxazole-Trimethoprim] Other (See Comments)    TACHYCARDIA    ROS Review of Systems  Constitutional:  Negative.  Negative for activity change, appetite change, chills, diaphoresis, fatigue, fever and unexpected weight change.  HENT: Negative.   Eyes: Negative.   Respiratory: Negative.   Cardiovascular: Positive for palpitations. Negative for chest pain and leg swelling.  Gastrointestinal: Negative.   Endocrine: Negative.   Genitourinary: Negative.   Musculoskeletal: Negative.   Skin:  Negative.   Allergic/Immunologic: Negative.   Neurological: Positive for dizziness and light-headedness. Negative for tremors, seizures, syncope, facial asymmetry, speech difficulty, weakness, numbness and headaches.  Hematological: Negative.   Psychiatric/Behavioral: Negative.   All other systems reviewed and are negative.     Objective:    Physical Exam Cardiovascular:     Rate and Rhythm: Regular rhythm. Tachycardia present.     Pulses: Normal pulses.     Heart sounds: Normal heart sounds. No murmur heard.  No friction rub. No gallop.      Comments: Of note - dropped beats. 2 beats abnormal during 1 minute of listening at [place]. No clear murmur noted, unclear etiology.  Pulmonary:     Effort: Pulmonary effort is normal. No respiratory distress.     Breath sounds: Normal breath sounds. No stridor. No wheezing, rhonchi or rales.  Chest:     Chest wall: No tenderness.  Skin:    General: Skin is warm and dry.     Capillary Refill: Capillary refill takes less than 2 seconds.     Coloration: Skin is not jaundiced or pale.     Findings: No bruising, erythema, lesion or rash.  Neurological:     General: No focal deficit present.     Mental Status: She is oriented to person, place, and time. Mental status is at baseline.  Psychiatric:        Mood and Affect: Mood normal.        Behavior: Behavior normal.        Thought Content: Thought content normal.        Judgment: Judgment normal.     BP 122/61   Pulse 89   Temp (!) 97.4 F (36.3 C) (Temporal)   Resp 18   Ht 5\' 7"  (1.702 m)   Wt  212 lb 9.6 oz (96.4 kg)   LMP 08/10/2012 Comment: ovaries remain//a.c.  SpO2 96%   BMI 33.30 kg/m  Wt Readings from Last 3 Encounters:  11/24/19 212 lb 9.6 oz (96.4 kg)  11/10/19 209 lb 12.8 oz (95.2 kg)  08/31/19 208 lb (94.3 kg)     Health Maintenance Due  Topic Date Due  . OPHTHALMOLOGY EXAM  11/10/2019    There are no preventive care reminders to display for this patient.  Lab Results  Component Value Date   TSH 1.410 08/28/2018   Lab Results  Component Value Date   WBC 8.9 11/17/2019   HGB 9.5 (L) 11/17/2019   HCT 33.1 (L) 11/17/2019   MCV 67 (L) 11/17/2019   PLT 401 11/17/2019   Lab Results  Component Value Date   NA 136 11/17/2019   K 4.3 11/17/2019   CO2 22 11/17/2019   GLUCOSE 155 (H) 11/17/2019   BUN 12 11/17/2019   CREATININE 0.92 11/17/2019   BILITOT 0.5 11/17/2019   ALKPHOS 123 (H) 11/17/2019   AST 44 (H) 11/17/2019   ALT 40 (H) 11/17/2019   PROT 7.2 11/17/2019   ALBUMIN 4.5 11/17/2019   CALCIUM 9.6 11/17/2019   ANIONGAP 10 11/11/2018   Lab Results  Component Value Date   CHOL 190 08/31/2019   Lab Results  Component Value Date   HDL 42 08/31/2019   Lab Results  Component Value Date   LDLCALC 96 08/31/2019   Lab Results  Component Value Date   TRIG 307 (H) 08/31/2019   Lab Results  Component Value Date   CHOLHDL 4.5 (H) 08/31/2019   Lab Results  Component Value Date  HGBA1C 7.2 (A) 11/24/2019      Assessment & Plan:   Problem List Items Addressed This Visit      Endocrine   DM II (diabetes mellitus, type II), controlled ([place])   Relevant Orders   POCT glycosylated hemoglobin (Hb A1C) (Completed)    Other Visit Diagnoses    Palpitations    -  Primary   Relevant Orders   Iron, TIBC and Ferritin Panel   CBC with Differential/Platelet   POCT glycosylated hemoglobin (Hb A1C) (Completed)   TSH   Basic Metabolic Panel   EKG 01-XBLT (Completed)   Basic Metabolic Panel   Ambulatory referral to Cardiology      No  orders of the defined types were placed in this encounter.   Follow-up: No follow-ups on file.   PLAN  EKG shows one abnormal beat with a biphasic - type T wave, though atypical even for a biphasic t wave. Isolated to this single beat. Otherwise rate and rhythm is regular. Interpretation reads short PR, correlated on EKG reading, Left Axis, old infarct (not clear on ekg interpretation) and nonspecific T abnormality. Biphasic t wave may suggest hypokalemia or ischemia but isolation to a single beat is concerning.   Urgent referral to Cardiology - Dr Jenkins Rouge, who she has established with in the past.  Strict ER precautions  Stat BMP - concern for hypokalemia. Other labs collected and will follow up as warranted  Continue K supplement, hold HCTZ until instructed otherwise. Close follow up pending lab results.   Patient encouraged to call clinic with any questions, comments, or concerns.  Maximiano Coss, NP

## 2019-11-24 NOTE — Patient Instructions (Signed)
° ° ° °  If you have lab work done today you will be contacted with your lab results within the next 2 weeks.  If you have not heard from us then please contact us. The fastest way to get your results is to register for My Chart. ° ° °IF you received an x-ray today, you will receive an invoice from [facility]. Please contact [facility] at [phone] with questions or concerns regarding your invoice.  ° °IF you received labwork today, you will receive an invoice from [facility]. Please contact [facility] at [phone] with questions or concerns regarding your invoice.  ° °Our billing staff will not be able to assist you with questions regarding bills from these companies. ° °You will be contacted with the lab results as soon as they are available. The fastest way to get your results is to activate your My Chart account. Instructions are located on the last page of this paperwork. If you have not heard from us regarding the results in 2 weeks, please contact this office. °  ° ° ° °

## 2019-11-24 NOTE — Telephone Encounter (Signed)
9:39 PM Stat lab call received from answering service. Creatinine 1.04, glucose 197, normal potassium.  Note reviewed from today with concern for potassium, but that is normal.  Will route telephone call to ordering provider.

## 2019-11-25 ENCOUNTER — Encounter: Payer: Self-pay | Admitting: Registered Nurse

## 2019-11-25 ENCOUNTER — Telehealth: Payer: Self-pay

## 2019-11-25 LAB — BASIC METABOLIC PANEL
BUN/Creatinine Ratio: 8 — ABNORMAL LOW (ref 9–23)
BUN: 7 mg/dL (ref 6–24)
CO2: 19 mmol/L — ABNORMAL LOW (ref 20–29)
Calcium: 9.1 mg/dL (ref 8.7–10.2)
Chloride: 104 mmol/L (ref 96–106)
Creatinine, Ser: 0.89 mg/dL (ref 0.57–1.00)
GFR calc Af Amer: 84 mL/min/{1.73_m2} (ref >59)
GFR calc non Af Amer: 73 mL/min/{1.73_m2} (ref >59)
Glucose: 179 mg/dL — ABNORMAL HIGH (ref 65–99)
Potassium: 4.3 mmol/L (ref 3.5–5.2)
Sodium: 139 mmol/L (ref 134–144)

## 2019-11-25 LAB — CBC WITH DIFFERENTIAL/PLATELET
Basophils Absolute: 0.1 10*3/uL (ref 0.0–0.2)
Basos: 1 %
EOS (ABSOLUTE): 0.2 10*3/uL (ref 0.0–0.4)
Eos: 2 %
Hematocrit: 31.1 % — ABNORMAL LOW (ref 34.0–46.6)
Hemoglobin: 9 g/dL — ABNORMAL LOW (ref 11.1–15.9)
Immature Grans (Abs): 0.1 10*3/uL (ref 0.0–0.1)
Immature Granulocytes: 1 %
Lymphocytes Absolute: 1.8 10*3/uL (ref 0.7–3.1)
Lymphs: 17 %
MCH: 19 pg — ABNORMAL LOW (ref 26.6–33.0)
MCHC: 28.9 g/dL — ABNORMAL LOW (ref 31.5–35.7)
MCV: 66 fL — ABNORMAL LOW (ref 79–97)
Monocytes Absolute: 0.4 10*3/uL (ref 0.1–0.9)
Monocytes: 4 %
Neutrophils Absolute: 8.3 10*3/uL — ABNORMAL HIGH (ref 1.4–7.0)
Neutrophils: 75 %
Platelets: 424 10*3/uL (ref 150–450)
RBC: 4.74 x10E6/uL (ref 3.77–5.28)
RDW: 17.2 % — ABNORMAL HIGH (ref 11.7–15.4)
WBC: 10.9 10*3/uL — ABNORMAL HIGH (ref 3.4–10.8)

## 2019-11-25 LAB — IRON,TIBC AND FERRITIN PANEL
Ferritin: 11 ng/mL — ABNORMAL LOW (ref 15–150)
Iron Saturation: 6 % — CL (ref 15–55)
Iron: 27 ug/dL (ref 27–159)
Total Iron Binding Capacity: 466 ug/dL — ABNORMAL HIGH (ref 250–450)
UIBC: 439 ug/dL — ABNORMAL HIGH (ref 131–425)

## 2019-11-25 LAB — TSH: TSH: 0.982 u[IU]/mL (ref 0.450–4.500)

## 2019-11-25 NOTE — Progress Notes (Signed)
Hello -   If we could call Ms Borak - potassium is normal. She needs to resume HCTZ and potassium supplements Continue iron supplements  I have placed referral for cardiology   We need to repeat CBC w diff next week, ideally m, tu, or wed.  Kathrin Ruddy, NP

## 2019-11-25 NOTE — Telephone Encounter (Signed)
Copied from [place] [id number]. Topic: Referral - Status >> Nov 25, 2019 10:43 AM Yvette Rack wrote: Reason for CRM: Pt requests referral to Dr. Quay Burow (cardiologist) at [facility]    This is already being handled elsewhere

## 2019-11-25 NOTE — Telephone Encounter (Signed)
Pt wonders if you know why her Hemoglobin dropped more but stated she would be in Tuesday for repeat blood work.

## 2019-11-25 NOTE — Telephone Encounter (Signed)
Pt would like her referral for Dr Johnsie Cancel to be switched to Dr. Gwenlyn Found as she has been recommended him by someone. She will need a new referral as the other was for Dr Johnsie Cancel specifically. Thank you.

## 2019-11-26 ENCOUNTER — Other Ambulatory Visit: Payer: Self-pay | Admitting: Emergency Medicine

## 2019-11-26 DIAGNOSIS — D72829 Elevated white blood cell count, unspecified: Secondary | ICD-10-CM

## 2019-11-27 ENCOUNTER — Telehealth: Payer: Self-pay | Admitting: Registered Nurse

## 2019-11-27 ENCOUNTER — Other Ambulatory Visit: Payer: Self-pay | Admitting: Registered Nurse

## 2019-11-27 DIAGNOSIS — R002 Palpitations: Secondary | ICD-10-CM

## 2019-11-27 NOTE — Telephone Encounter (Signed)
11/27/19 left message for patient to return call to schedule referral w/Dr. Gwenlyn Found

## 2019-12-01 ENCOUNTER — Ambulatory Visit: Payer: BC Managed Care – PPO | Admitting: Registered Nurse

## 2019-12-01 ENCOUNTER — Other Ambulatory Visit: Payer: Self-pay

## 2019-12-01 ENCOUNTER — Ambulatory Visit
Admission: RE | Admit: 2019-12-01 | Discharge: 2019-12-01 | Disposition: A | Discharge status: Home / Self Care | Payer: BC Managed Care – PPO | Source: Ambulatory Visit | Attending: Registered Nurse | Admitting: Registered Nurse

## 2019-12-01 DIAGNOSIS — R1084 Generalized abdominal pain: Secondary | ICD-10-CM

## 2019-12-01 DIAGNOSIS — K5730 Diverticulosis of large intestine without perforation or abscess without bleeding: Secondary | ICD-10-CM | POA: Diagnosis not present

## 2019-12-01 DIAGNOSIS — D72829 Elevated white blood cell count, unspecified: Secondary | ICD-10-CM

## 2019-12-01 DIAGNOSIS — R002 Palpitations: Secondary | ICD-10-CM

## 2019-12-02 ENCOUNTER — Encounter: Payer: Self-pay | Admitting: Registered Nurse

## 2019-12-02 ENCOUNTER — Encounter: Payer: Self-pay | Admitting: Orthopaedic Surgery

## 2019-12-02 DIAGNOSIS — K449 Diaphragmatic hernia without obstruction or gangrene: Secondary | ICD-10-CM | POA: Insufficient documentation

## 2019-12-02 LAB — CBC WITH DIFFERENTIAL/PLATELET
Basophils Absolute: 0.1 10*3/uL (ref 0.0–0.2)
Basos: 1 %
EOS (ABSOLUTE): 0.2 10*3/uL (ref 0.0–0.4)
Eos: 2 %
Hematocrit: 32.9 % — ABNORMAL LOW (ref 34.0–46.6)
Hemoglobin: 9.5 g/dL — ABNORMAL LOW (ref 11.1–15.9)
Immature Grans (Abs): 0 10*3/uL (ref 0.0–0.1)
Immature Granulocytes: 1 %
Lymphocytes Absolute: 1.5 10*3/uL (ref 0.7–3.1)
Lymphs: 18 %
MCH: 19.5 pg — ABNORMAL LOW (ref 26.6–33.0)
MCHC: 28.9 g/dL — ABNORMAL LOW (ref 31.5–35.7)
MCV: 67 fL — ABNORMAL LOW (ref 79–97)
Monocytes Absolute: 0.4 10*3/uL (ref 0.1–0.9)
Monocytes: 4 %
Neutrophils Absolute: 6 10*3/uL (ref 1.4–7.0)
Neutrophils: 74 %
Platelets: 357 10*3/uL (ref 150–450)
RBC: 4.88 x10E6/uL (ref 3.77–5.28)
RDW: 19.6 % — ABNORMAL HIGH (ref 11.7–15.4)
WBC: 8.2 10*3/uL (ref 3.4–10.8)

## 2019-12-02 LAB — VITAMIN B12: Vitamin B-12: 1112 pg/mL (ref 232–1245)

## 2019-12-03 ENCOUNTER — Encounter: Payer: Self-pay | Admitting: Registered Nurse

## 2019-12-03 NOTE — Telephone Encounter (Signed)
Spoke with pt to let her know that the lab will call and have the iron added on and we will give her the results when they come in.

## 2019-12-08 ENCOUNTER — Ambulatory Visit: Payer: BC Managed Care – PPO | Admitting: Internal Medicine

## 2019-12-08 ENCOUNTER — Encounter: Payer: Self-pay | Admitting: Internal Medicine

## 2019-12-08 VITALS — BP 120/80 | HR 92 | Ht 67.0 in | Wt 210.4 lb

## 2019-12-08 DIAGNOSIS — R1013 Epigastric pain: Secondary | ICD-10-CM

## 2019-12-08 DIAGNOSIS — R935 Abnormal findings on diagnostic imaging of other abdominal regions, including retroperitoneum: Secondary | ICD-10-CM

## 2019-12-08 DIAGNOSIS — Z01818 Encounter for other preprocedural examination: Secondary | ICD-10-CM

## 2019-12-08 DIAGNOSIS — D509 Iron deficiency anemia, unspecified: Secondary | ICD-10-CM | POA: Diagnosis not present

## 2019-12-08 DIAGNOSIS — K219 Gastro-esophageal reflux disease without esophagitis: Secondary | ICD-10-CM

## 2019-12-08 LAB — IRON,TIBC AND FERRITIN PANEL
Ferritin: 14 ng/mL — ABNORMAL LOW (ref 15–150)
Iron Saturation: 86 % (ref 15–55)
Iron: 420 ug/dL (ref 27–159)
Total Iron Binding Capacity: 487 ug/dL — ABNORMAL HIGH (ref 250–450)
UIBC: 67 ug/dL — ABNORMAL LOW (ref 131–425)

## 2019-12-08 LAB — SPECIMEN STATUS REPORT

## 2019-12-08 MED ORDER — SUPREP BOWEL PREP KIT 17.5-3.13-1.6 GM/177ML PO SOLN: 1.0000 | ORAL | 0 refills | Status: DC

## 2019-12-08 NOTE — Progress Notes (Signed)
Dr. Carlota Raspberry -   Wanted to run these results by you and get your thoughts. I think some of the lability is coming from restarting her iron supplementation but wanted to get your thoughts on the rapid shift.   Thanks,  Kathrin Ruddy, NP

## 2019-12-08 NOTE — Patient Instructions (Signed)
You have been scheduled for an endoscopy and colonoscopy. Please follow the written instructions given to you at your visit today. Please pick up your prep supplies at the pharmacy within the next 1-3 days. If you use inhalers (even only as needed), please bring them with you on the day of your procedure. Your physician has requested that you go to www.startemmi.com and enter the access code given to you at your visit today. This web site gives a general overview about your procedure. However, you should still follow specific instructions given to you by our office regarding your preparation for the procedure.  Continue Nexium.  Continue oral iron.  If you are age 72 or older, your body mass index should be between 23-30. Your Body mass index is 32.95 kg/m. If this is out of the aforementioned range listed, please consider follow up with your Primary Care Provider.  If you are age 27 or younger, your body mass index should be between 19-25. Your Body mass index is 32.95 kg/m. If this is out of the aformentioned range listed, please consider follow up with your Primary Care Provider.   Due to recent changes in healthcare laws, you may see the results of your imaging and laboratory studies on MyChart before your provider has had a chance to review them.  We understand that in some cases there may be results that are confusing or concerning to you. Not all laboratory results come back in the same time frame and the provider may be waiting for multiple results in order to interpret others.  Please give [place] 48 hours in order for your provider to thoroughly review all the results before contacting the office for clarification of your results.

## 2019-12-08 NOTE — Progress Notes (Signed)
Patient ID: Angelica Sharp, female   DOB: 08-03-1965, 54 y.o.   MRN: [id number] HPI: Angelica Sharp is a 54 year old female with a past medical history of GERD, diverticulosis, hypertension, hyperlipidemia who is seen in consult at the request of Dr. Carlota Raspberry to evaluate upper abdominal pain, iron deficiency anemia and abnormal GI imaging.  She is here alone today.  She reports that she has a longstanding history of heartburn dating back more than a decade.  She has been on Nexium at some dose for over 10 years.  She is currently using Nexium 40 mg a day.  Her symptoms warranting PPI were traditional heartburn indigestion.  Her heartburn at this point is well controlled.  She does occasionally have globus sensation but denies solid and liquid food dysphagia.  No odynophagia.  No nausea or vomiting.  No early satiety.  She does have epigastric and upper bandlike abdominal pain which is brought on by eating.  She will occasionally feel pressure between her shoulder blades.  She reports her bowel movements have been regular though she can vary between constipation and diarrhea.  She will use MiraLAX on occasion to avoid constipation.  She has a history of diverticulitis having been hospitalized for the same on 1 occasion.  She does not recall an episode of diverticulitis in the last few years.  No blood in her stool or melena.  She was told recently she has low iron and started oral iron about 1 week ago..  She also started B12.  No unexplained weight loss.  Family history notable for a sister with an unknown diagnosed abdominal cancer.  She had a very elevated CA-125.  She also had CHF and kidney failure.  Her sister died in her 74s.  She reports that she had initially started having irregular heartbeat and palpitations as well as a funny feeling in her chest.  No specific chest pain or dyspnea.  She would feel skipped beats.  This led to discovery of her iron deficiency anemia and she has been referred to see Dr.  Gwenlyn Found tomorrow.  She recalls a colonoscopy roughly 8 years ago which she was told was normal with the exception of diverticulosis.  She is divorced.  3 children.  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  Never tobacco user.  No alcohol use.  Past Medical History:  Diagnosis Date  . Allergy   . Anemia   . Anxiety   . Arthritis   . Borderline hypertension   . Chest pain   . Complication of anesthesia   . Degenerative lumbar disc    L5S1  . Diabetes mellitus without complication ([place])   . Diverticulitis   . Dyspnea   . Edema    lower extremity  . Family history of anesthesia complication    sister had BP complications during anes.  . Fatty liver   . GERD (gastroesophageal reflux disease)   . H/O hiatal hernia   . Headache   . HLD (hyperlipidemia)   . Hypertension   . Pneumonia   . PONV (postoperative nausea and vomiting)   . SOB (shortness of breath)     Past Surgical History:  Procedure Laterality Date  . BREAST LUMPECTOMY     4 lumps -- two from right -- one from left  . LAPAROSCOPIC ASSISTED VAGINAL HYSTERECTOMY N/A 09/18/2012   Procedure: LAPAROSCOPIC ASSISTED VAGINAL HYSTERECTOMY;  Surgeon: Linda Hedges, DO;  Location: [facility];  Service: Gynecology;  Laterality: N/A;  BLADDER  DISTENTION  . TOTAL HIP  ARTHROPLASTY Left 11/10/2018   Procedure: LEFT TOTAL HIP ARTHROPLASTY ANTERIOR APPROACH;  Surgeon: Leandrew Koyanagi, MD;  Location: [facility];  Service: Orthopedics;  Laterality: Left;  . TUBAL LIGATION      Outpatient Medications Prior to Visit  Medication Sig Dispense Refill  . Calcium Carb-Cholecalciferol (CALCIUM+D3 PO) Take 1 tablet by mouth daily.    Marland Kitchen esomeprazole (NEXIUM) 40 MG capsule Take 1 capsule by mouth once daily 90 capsule 0  . glucose blood test strip Use as instructed 100 each 3  . hydrochlorothiazide (HYDRODIURIL) 25 MG tablet Take 1 tablet by mouth once daily 90 tablet 0  . loperamide (IMODIUM A-D) 2 MG tablet Take 4 mg by mouth 4 (four) times daily as needed for  diarrhea or loose stools.    . metFORMIN (GLUCOPHAGE) 500 MG tablet Take 1 tablet by mouth in the morning, at noon, and at bedtime.    . ondansetron (ZOFRAN-ODT) 4 MG disintegrating tablet Take 4 mg by mouth every 8 (eight) hours as needed for nausea or vomiting.    Marland Kitchen Phenazopyridine HCl (AZO URINARY PAIN PO) Take 2 tablets by mouth daily as needed (urinary discomfort).    . polyethylene glycol (MIRALAX / GLYCOLAX) packet Take 17 g by mouth daily as needed for mild constipation.     . Potassium 99 MG TABS Take 99-198 mg by mouth See admin instructions. Take 198 mg in the morning and 99 mg at night    . pravastatin (PRAVACHOL) 40 MG tablet Take 1 tablet (40 mg total) by mouth at bedtime. 90 tablet 3  . fluticasone (FLONASE) 50 MCG/ACT nasal spray Place 2 sprays into both nostrils daily for 10 days. 16 g 0  . metFORMIN (GLUCOPHAGE-XR) 750 MG 24 hr tablet Take 750 mg by mouth 2 (two) times daily.     . phenazopyridine (PYRIDIUM) 95 MG tablet Take 1 tablet (95 mg total) by mouth 3 (three) times daily as needed for pain. 10 tablet 0   No facility-administered medications prior to visit.    Allergies  Allergen Reactions  . Sulfa Antibiotics Hives and Other (See Comments)    Heart Rate goes up  . Morphine And Related Other (See Comments)    Headache and hallucinations  . Septra [Sulfamethoxazole-Trimethoprim] Other (See Comments)    TACHYCARDIA    Family History  Problem Relation Age of Onset  . Heart attack Mother   . Diabetes Mother   . Hyperlipidemia Mother   . Hypertension Mother   . Stroke Sister   . Pulmonary embolism Father   . Clotting disorder Father   . Diabetes Sister   . Kidney disease Sister        kidney failure  . Heart disease Sister   . Cancer Sister        abdominal  . Diabetes Son 58       TYPE 1  . Kidney cancer Sister   . Crohn's disease Niece   . Cancer Nephew        gallbladder    Social History   Tobacco Use  . Smoking status: Never Smoker  .  Smokeless tobacco: Never Used  Vaping Use  . Vaping Use: Never used  Substance Use Topics  . Alcohol use: No  . Drug use: No    ROS: As per history of present illness, otherwise negative  BP 120/80 (BP Location: Left Arm, Patient Position: Sitting, Cuff Size: Normal)   Pulse 92   Ht 5\' 7"  (1.702 m) Comment: height  measured without shoes  Wt 210 lb 6 oz (95.4 kg)   LMP 08/10/2012 Comment: ovaries remain//a.c.  BMI 32.95 kg/m  Constitutional: Well-developed and well-nourished. No distress. HEENT: Normocephalic and atraumatic.Conjunctivae are normal.  No scleral icterus. Neck: Neck supple. Trachea midline. Cardiovascular: Normal rate, regular rhythm and intact distal pulses. No M/R/G Pulmonary/chest: Effort normal and breath sounds normal. No wheezing, rales or rhonchi. Abdominal: Soft, nontender, nondistended. Bowel sounds active throughout. There are no masses palpable. No hepatosplenomegaly. Extremities: no clubbing, cyanosis, or edema Neurological: Alert and oriented to person place and time. Skin: Skin is warm and dry.  Psychiatric: Normal mood and affect. Behavior is normal.  RELEVANT LABS AND IMAGING: CBC    Component Value Date/Time   WBC 8.2 12/01/2019 1525   WBC 10.9 (H) 11/12/2018 0418   RBC 4.88 12/01/2019 1525   RBC 3.35 (L) 11/12/2018 0418   HGB 9.5 (L) 12/01/2019 1525   HCT 32.9 (L) 12/01/2019 1525   PLT 357 12/01/2019 1525   MCV 67 (L) 12/01/2019 1525   MCH 19.5 (L) 12/01/2019 1525   MCH 23.9 (L) 11/12/2018 0418   MCHC 28.9 (L) 12/01/2019 1525   MCHC 30.5 11/12/2018 0418   RDW 19.6 (H) 12/01/2019 1525   LYMPHSABS 1.5 12/01/2019 1525   MONOABS 0.4 10/30/2018 1355   EOSABS 0.2 12/01/2019 1525   BASOSABS 0.1 12/01/2019 1525    CMP     Component Value Date/Time   NA 138 11/24/2019 1719   K 4.0 11/24/2019 1719   CL 105 11/24/2019 1719   CO2 18 (L) 11/24/2019 1719   GLUCOSE 197 (H) 11/24/2019 1719   GLUCOSE 144 (H) 11/11/2018 0453   BUN 7 11/24/2019  1719   CREATININE 1.04 (H) 11/24/2019 1719   CREATININE 1.01 05/10/2015 1240   CALCIUM 9.0 11/24/2019 1719   PROT 7.2 11/17/2019 1030   ALBUMIN 4.5 11/17/2019 1030   AST 44 (H) 11/17/2019 1030   ALT 40 (H) 11/17/2019 1030   ALKPHOS 123 (H) 11/17/2019 1030   BILITOT 0.5 11/17/2019 1030   GFRNONAA 61 11/24/2019 1719   GFRNONAA 82 10/01/2014 1106   GFRAA 70 11/24/2019 1719   GFRAA >89 10/01/2014 1106   Iron/TIBC/Ferritin/ %Sat    Component Value Date/Time   IRON 420 (HH) 12/01/2019 1525   TIBC 487 (H) 12/01/2019 1525   FERRITIN 14 (L) 12/01/2019 1525   IRONPCTSAT 86 (HH) 12/01/2019 1525     CT ABDOMEN AND PELVIS WITHOUT CONTRAST   TECHNIQUE: Multidetector CT imaging of the abdomen and pelvis was performed following the standard protocol without IV contrast.   COMPARISON:  CT 03/08/2017   FINDINGS: Lower chest: Some stable bandlike areas of opacity in the left lung base could reflect atelectasis or scarring. Lung bases otherwise clear. Hypoattenuation of the cardiac blood pool relative the myocardium compatible with anemia. Cardiac size within normal limits. Trace pericardial fluid similar to prior and likely physiologically normal for this patient.   Hepatobiliary: No worrisome liver lesions. Few subcentimeter hypoattenuating foci unchanged from prior, too small to characterize fully on this examination but likely benign. No visible worrisome liver lesions. Normal gallbladder and biliary tree without visible calcified gallstone.   Pancreas: Partial fatty replacement of the pancreas most pronounced at the pancreatic head and uncinate. No inflammation, ductal dilatation or discernible pancreatic lesion.   Spleen: Borderline splenomegaly.  No concerning splenic lesion.   Adrenals/Urinary Tract: Normal adrenal glands. Kidneys are symmetric in size and normally position. Partially exophytic fluid attenuation cyst measuring 1.6 cm  again seen arising from the inferior  pole left kidney. No concerning visible or contour deforming renal lesions. Stable mild bilateral symmetric perinephric stranding, a nonspecific finding though may correlate with either age or decreased renal function. No urolithiasis or hydronephrosis. Urinary bladder is largely decompressed at the time of exam and therefore poorly evaluated by CT imaging. No gross bladder abnormality.   Stomach/Bowel: Small sliding-type hiatal hernia. Some irregular asymmetric nodular thickening is present in the distal thoracic esophagus, similar to comparison in retrospect (2/10). Distal stomach and duodenum are unremarkable. No small bowel thickening or dilatation. A normal appendix is visualized. Scattered right and left colon diverticula are present. No focal diverticular inflammation to suggest acute diverticulitis. Contrast media traverses to the level of the rectum without evidence of bowel obstruction.   Vascular/Lymphatic: Minimal plaque in the abdominal aorta. No aneurysm or ectasia. No other significant vascular findings. No suspicious or enlarged lymph nodes in the included lymphatic chains.   Reproductive: Post hysterectomy. Few phleboliths. Some reticular changes about the left ovary likely chronic scarring, similar to prior.   Other: No abdominopelvic free fluid or free gas. No bowel containing hernias.   Musculoskeletal: Multilevel degenerative changes are present in the imaged portions of the spine. Additional mild degenerative changes in the pelvis and right hip. Prior left hip total arthroplasty in expected alignment without acute complication.   IMPRESSION: 1. No acute CT abnormality to provide cause for patient's abdominal pain. 2. Diverticulosis without evidence of acute diverticulitis. Diverticulosis may present as a source of GI bleed even in the absence of inflammation. 3. Hypoattenuation of the cardiac blood pool relative to the myocardium suggestive of anemia,  compatible with patient history. 4. Small sliding-type hiatal hernia with some persistent irregular asymmetric nodular thickening in the distal thoracic esophagus. Could reflect sequela of prior esophagitis, however, consider correlation with outpatient upper endoscopy to exclude underlying malignancy. 5. Borderline splenomegaly. 6. Aortic Atherosclerosis (ICD10-I70.0).     Electronically Signed   By: Lovena Le M.D.   On: 12/02/2019 05:30  ASSESSMENT/PLAN: 54 year old female with a past medical history of GERD, diverticulosis, hypertension, hyperlipidemia who is seen in consult at the request of Dr. Carlota Raspberry to evaluate upper abdominal pain, iron deficiency anemia and abnormal GI imaging.   1. IDA/history of GERD/abnormal GI imaging showing hiatal hernia with nodular thickening of the distal esophagus --we discussed her symptoms today.  Her bandlike abdominal pain may relate to uncontrolled reflux but very likely to her hiatal hernia.  This may eventually need to be repaired.  Given the abnormal imaging and iron deficiency I have recommended that we pursue endoscopic evaluation. --Upper endoscopy and colonoscopy in the [place] --Continue oral iron supplementation --Continue Nexium 40 mg daily  2.  Palpitations --she will be seen by Dr. Gwenlyn Found with cardiology tomorrow      SW:HQPRFF, Ranell Patrick, [address] [place],  [place] [address]

## 2019-12-09 ENCOUNTER — Encounter: Payer: Self-pay | Admitting: Registered Nurse

## 2019-12-09 ENCOUNTER — Other Ambulatory Visit: Payer: Self-pay

## 2019-12-09 ENCOUNTER — Ambulatory Visit: Payer: BC Managed Care – PPO | Admitting: Cardiovascular Disease

## 2019-12-09 ENCOUNTER — Encounter: Payer: Self-pay | Admitting: Cardiovascular Disease

## 2019-12-09 VITALS — BP 136/84 | HR 70 | Ht 67.0 in | Wt 210.2 lb

## 2019-12-09 DIAGNOSIS — R0609 Other forms of dyspnea: Secondary | ICD-10-CM

## 2019-12-09 DIAGNOSIS — R002 Palpitations: Secondary | ICD-10-CM | POA: Insufficient documentation

## 2019-12-09 DIAGNOSIS — R079 Chest pain, unspecified: Secondary | ICD-10-CM

## 2019-12-09 DIAGNOSIS — E782 Mixed hyperlipidemia: Secondary | ICD-10-CM

## 2019-12-09 DIAGNOSIS — R0789 Other chest pain: Secondary | ICD-10-CM

## 2019-12-09 DIAGNOSIS — Z8249 Family history of ischemic heart disease and other diseases of the circulatory system: Secondary | ICD-10-CM | POA: Diagnosis not present

## 2019-12-09 DIAGNOSIS — R0600 Dyspnea, unspecified: Secondary | ICD-10-CM | POA: Diagnosis not present

## 2019-12-09 DIAGNOSIS — I10 Essential (primary) hypertension: Secondary | ICD-10-CM

## 2019-12-09 NOTE — Assessment & Plan Note (Signed)
Atypical chest pain with occasional pain in her chest but also between her shoulder blades.  There is a family history of heart disease with other cardiac risk factors include hypertension, hyperlipidemia and diabetes.  I am going to get a coronary calcium score to risk stratify

## 2019-12-09 NOTE — Progress Notes (Signed)
12/09/2019 [place]   13-Dec-1965  [id number]  Primary Physician Wendie Agreste, MD Primary Cardiologist: Lorretta Harp MD Lupe Carney, [place]  HPI:  Angelica Sharp is a 54 y.o. mildly overweight divorced Caucasian female mother of 3, grandmother for grandchildren who works at [employer] in [employer] department.  She was referred by Maximiano Coss , NP for evaluation of dyspnea, atypical chest pain and palpitations.  Risk factors include treated hypertension, diabetes and hyperlipidemia.  She does not smoke.  She has had a brothers had stenting.  She is never had a heart attack or stroke.  She is noticed dyspnea over the last month with some atypical chest pain rating to her back.  She also had palpitations for last 4 weeks.  She drinks 1 cup of caffeinated beverages a day.  She exercises minimally and walks on occasion.   Current Meds  Medication Sig   Calcium Carb-Cholecalciferol (CALCIUM+D3 PO) Take 1 tablet by mouth daily.   Cyanocobalamin (VITAMIN B-12 PO) Take by mouth.   esomeprazole (NEXIUM) 40 MG capsule Take 1 capsule by mouth once daily   Ferrous Sulfate (IRON PO) Take by mouth.   glucose blood test strip Use as instructed   hydrochlorothiazide (HYDRODIURIL) 25 MG tablet Take 1 tablet by mouth once daily   loperamide (IMODIUM A-D) 2 MG tablet Take 4 mg by mouth 4 (four) times daily as needed for diarrhea or loose stools.   metFORMIN (GLUCOPHAGE) 500 MG tablet Take 1 tablet by mouth in the morning, at noon, and at bedtime.   ondansetron (ZOFRAN-ODT) 4 MG disintegrating tablet Take 4 mg by mouth every 8 (eight) hours as needed for nausea or vomiting.   Phenazopyridine HCl (AZO URINARY PAIN PO) Take 2 tablets by mouth daily as needed (urinary discomfort).   polyethylene glycol (MIRALAX / GLYCOLAX) packet Take 17 g by mouth daily as needed for mild constipation.    Potassium 99 MG TABS Take 99-198 mg by mouth See admin instructions. Take 198 mg in the  morning and 99 mg at night   pravastatin (PRAVACHOL) 40 MG tablet Take 1 tablet (40 mg total) by mouth at bedtime.     Allergies  Allergen Reactions   Sulfa Antibiotics Hives and Other (See Comments)    Heart Rate goes up   Morphine And Related Other (See Comments)    Headache and hallucinations   Septra [Sulfamethoxazole-Trimethoprim] Other (See Comments)    TACHYCARDIA    Social History   Socioeconomic History   Marital status: Divorced    Spouse name: Not on file   Number of children: 3   Years of education: Not on file   Highest education level: Not on file  Occupational History   Occupation: Mortgage    Employer: [employer]    Comment: [employer] - now works from home  Tobacco Use   Smoking status: Never Smoker   Smokeless tobacco: Never Used  Scientific laboratory technician Use: Never used  Substance and Sexual Activity   Alcohol use: No   Drug use: No   Sexual activity: Not Currently  Other Topics Concern   Not on file  Social History Narrative   Lives with daughter and grandkids.   Social Determinants of Health   Financial Resource Strain:    Difficulty of Paying Living Expenses:   Food Insecurity:    Worried About Charity fundraiser in the Last Year:    [employer]  in the Last Year:   Transportation Needs:    Film/video editor (Medical):    Lack of Transportation (Non-Medical):   Physical Activity:    Days of Exercise per Week:    Minutes of Exercise per Session:   Stress:    Feeling of Stress :   Social Connections:    Frequency of Communication with Friends and Family:    Frequency of Social Gatherings with Friends and Family:    Attends Religious Services:    Active Member of Clubs or Organizations:    Attends Music therapist:    Marital Status:   Intimate Partner Violence:    Fear of Current or Ex-Partner:    Emotionally Abused:    Physically Abused:    Sexually Abused:       Review of Systems: General: negative for chills, fever, night sweats or weight changes.  Cardiovascular: negative for chest pain, dyspnea on exertion, edema, orthopnea, palpitations, paroxysmal nocturnal dyspnea or shortness of breath Dermatological: negative for rash Respiratory: negative for cough or wheezing Urologic: negative for hematuria Abdominal: negative for nausea, vomiting, diarrhea, bright red blood per rectum, melena, or hematemesis Neurologic: negative for visual changes, syncope, or dizziness All other systems reviewed and are otherwise negative except as noted above.    Blood pressure 136/84, pulse 70, height 5\' 7"  (1.702 m), weight 210 lb 3.2 oz (95.3 kg), last menstrual period 08/10/2012, SpO2 98 %.  General appearance: alert and no distress Neck: no adenopathy, no carotid bruit, no JVD, supple, symmetrical, trachea midline and thyroid not enlarged, symmetric, no tenderness/mass/nodules Lungs: clear to auscultation bilaterally Heart: regular rate and rhythm, S1, S2 normal, no murmur, click, rub or gallop Extremities: extremities normal, atraumatic, no cyanosis or edema Pulses: 2+ and symmetric Skin: Skin color, texture, turgor normal. No rashes or lesions Neurologic: Alert and oriented X 3, normal strength and tone. Normal symmetric reflexes. Normal coordination and gait  EKG not performed today  ASSESSMENT AND PLAN:   Palpitations History of palpitations that started over the last 4 weeks occurring on a daily basis.  She drinks 1 caffeinated drink a day.  We will get a 2-week Zio patch to further evaluate  Dyspnea on exertion Dyspnea on exertion over the last several weeks with minimal activity.  She is somewhat anemic scheduled for upper endoscopy later next month by Dr. Hilarie Fredrickson.  I am to get a 2D echo to further evaluate  Atypical chest pain Atypical chest pain with occasional pain in her chest but also between her shoulder blades.  There is a family history  of heart disease with other cardiac risk factors include hypertension, hyperlipidemia and diabetes.  I am going to get a coronary calcium score to risk stratify  Hyperlipidemia History of hyperlipidemia on statin therapy with lipid profile performed 08/31/2019 revealing total cholesterol 190, LDL of 83 and HDL 42  Essential hypertension History of essential potential blood pressure measured today 136/84.  She is on hydrochlorothiazide.  Family history of heart disease Family history of heart disease with a brother who is had stenting.      Lorretta Harp MD FACP,FACC,FAHA, [facility] 12/09/2019 4:14 PM

## 2019-12-09 NOTE — Patient Instructions (Signed)
Medication Instructions:  NO CHANGE *If you need a refill on your cardiac medications before your next appointment, please call your pharmacy*   Lab Work: If you have labs (blood work) drawn today and your tests are completely normal, you will receive your results only by: Marland Kitchen MyChart Message (if you have MyChart) OR . A paper copy in the mail If you have any lab test that is abnormal or we need to change your treatment, we will call you to review the results.   Testing/Procedures: Your physician has requested that you have an echocardiogram. Echocardiography is a painless test that uses sound waves to create images of your heart. It provides your doctor with information about the size and shape of your heart and how well your heart's chambers and valves are working. This procedure takes approximately one hour. There are no restrictions for this procedure.[place] Monitor Instructions   Your physician has requested you wear your ZIO patch monitor__14____days.   This is a single patch monitor.  [employer] supplies one patch monitor per enrollment.  Additional stickers are not available.   Please do not apply patch if you will be having a Nuclear Stress Test, Echocardiogram, Cardiac CT, MRI, or Chest Xray during the time frame you would be wearing the monitor. The patch cannot be worn during these tests.  You cannot remove and re-apply the ZIO XT patch monitor.   Your ZIO patch monitor will be sent USPS Priority mail from [facility] directly to your home address. The monitor may also be mailed to a PO BOX if home delivery is not available.   It may take 3-5 days to receive your monitor after you have been enrolled.   Once you have received you monitor, please review enclosed instructions.  Your monitor has already been registered assigning a specific monitor serial # to you.   Applying the monitor    Shave hair from upper left chest.   Hold abrader disc by orange tab.  Rub abrader in 40 strokes over left upper chest as indicated in your monitor instructions.   Clean area with 4 enclosed alcohol pads .  Use all pads to assure are is cleaned thoroughly.  Let dry.   Apply patch as indicated in monitor instructions.  Patch will be place under collarbone on left side of chest with arrow pointing upward.   Rub patch adhesive wings for 2 minutes.Remove white label marked "1".  Remove white label marked "2".  Rub patch adhesive wings for 2 additional minutes.   While looking in a mirror, press and release button in center of patch.  A small green light will flash 3-4 times .  This will be your only indicator the monitor has been turned on.     Do not shower for the first 24 hours.  You may shower after the first 24 hours.   Press button if you feel a symptom. You will hear a small click.  Record Date, Time and Symptom in the Patient Log Book.   When you are ready to remove patch, follow instructions on last 2 pages of Patient Log Book.  Stick patch monitor onto last page of Patient Log Book.   Place Patient Log Book in [place] box.  Use locking tab on box and tape box closed securely.  The [employer] has [employer] on it.  Please place in mailbox  as soon as possible.  Your physician should have your test results approximately 7 days after the monitor has been mailed back to [facility].   Call Brooke at [phone] if you have questions regarding your ZIO XT patch monitor.  Call them immediately if you see an orange light blinking on your monitor.   If your monitor falls off in less than 4 days contact our Monitor department at [phone].  If your monitor becomes loose or falls off after 4 days call [employer] at [phone] for suggestions on securing your monitor.        Follow-Up: At [facility], you and your health needs are our priority.   As part of our continuing mission to provide you with exceptional heart care, we have created designated Provider Care Teams.  These Care Teams include your primary Cardiologist (physician) and Advanced Practice Providers (APPs -  Physician Assistants and Nurse Practitioners) who all work together to provide you with the care you need, when you need it.  We recommend signing up for the patient portal called "MyChart".  Sign up information is provided on this After Visit Summary.  MyChart is used to connect with patients for Virtual Visits (Telemedicine).  Patients are able to view lab/test results, encounter notes, upcoming appointments, etc.  Non-urgent messages can be sent to your provider as well.   To learn more about what you can do with MyChart, go to [email or web address].    Your next appointment:    Your physician recommends that you schedule a follow-up appointment in: [place]

## 2019-12-09 NOTE — Assessment & Plan Note (Signed)
History of palpitations that started over the last 4 weeks occurring on a daily basis.  She drinks 1 caffeinated drink a day.  We will get a 2-week Zio patch to further evaluate

## 2019-12-09 NOTE — Assessment & Plan Note (Signed)
History of essential potential blood pressure measured today 136/84.  She is on hydrochlorothiazide.

## 2019-12-09 NOTE — Assessment & Plan Note (Signed)
History of hyperlipidemia on statin therapy with lipid profile performed 08/31/2019 revealing total cholesterol 190, LDL of 83 and HDL 42

## 2019-12-09 NOTE — Assessment & Plan Note (Signed)
Family history of heart disease with a brother who is had stenting.

## 2019-12-09 NOTE — Assessment & Plan Note (Signed)
Dyspnea on exertion over the last several weeks with minimal activity.  She is somewhat anemic scheduled for upper endoscopy later next month by Dr. Hilarie Fredrickson.  I am to get a 2D echo to further evaluate

## 2019-12-10 ENCOUNTER — Encounter: Payer: Self-pay | Admitting: *Deleted

## 2019-12-10 NOTE — Progress Notes (Signed)
Patient ID: Angelica Sharp, female   DOB: 11/16/1965, 54 y.o.   MRN: [id number] Patient enrolled for [employer] to ship a 14 day ZIO XT long term holter monitor to her home.

## 2019-12-15 ENCOUNTER — Ambulatory Visit (INDEPENDENT_AMBULATORY_CARE_PROVIDER_SITE_OTHER): Payer: BC Managed Care – PPO

## 2019-12-15 DIAGNOSIS — R002 Palpitations: Secondary | ICD-10-CM | POA: Diagnosis not present

## 2019-12-24 ENCOUNTER — Encounter: Payer: Self-pay | Admitting: Family Medicine

## 2019-12-24 DIAGNOSIS — Z1231 Encounter for screening mammogram for malignant neoplasm of breast: Secondary | ICD-10-CM | POA: Diagnosis not present

## 2019-12-28 ENCOUNTER — Encounter: Payer: Self-pay | Admitting: Internal Medicine

## 2019-12-29 ENCOUNTER — Ambulatory Visit (HOSPITAL_COMMUNITY): Payer: BC Managed Care – PPO | Attending: Cardiology

## 2019-12-29 ENCOUNTER — Other Ambulatory Visit: Payer: Self-pay

## 2019-12-29 ENCOUNTER — Ambulatory Visit (INDEPENDENT_AMBULATORY_CARE_PROVIDER_SITE_OTHER)
Admission: RE | Admit: 2019-12-29 | Discharge: 2019-12-29 | Disposition: A | Discharge status: Home / Self Care | Payer: Self-pay | Source: Ambulatory Visit | Attending: Cardiovascular Disease | Admitting: Cardiovascular Disease

## 2019-12-29 DIAGNOSIS — R079 Chest pain, unspecified: Secondary | ICD-10-CM

## 2019-12-29 DIAGNOSIS — R0609 Other forms of dyspnea: Secondary | ICD-10-CM

## 2019-12-29 DIAGNOSIS — R0600 Dyspnea, unspecified: Secondary | ICD-10-CM | POA: Diagnosis not present

## 2019-12-29 DIAGNOSIS — J8410 Pulmonary fibrosis, unspecified: Secondary | ICD-10-CM | POA: Diagnosis not present

## 2019-12-30 LAB — ECHOCARDIOGRAM COMPLETE
Area-P 1/2: 3.61 cm2
S' Lateral: 3 cm

## 2019-12-31 ENCOUNTER — Other Ambulatory Visit: Payer: Self-pay | Admitting: Internal Medicine

## 2019-12-31 ENCOUNTER — Ambulatory Visit (INDEPENDENT_AMBULATORY_CARE_PROVIDER_SITE_OTHER): Payer: BC Managed Care – PPO

## 2019-12-31 DIAGNOSIS — Z1159 Encounter for screening for other viral diseases: Secondary | ICD-10-CM

## 2019-12-31 LAB — SARS CORONAVIRUS 2 (TAT 6-24 HRS): SARS Coronavirus 2: NEGATIVE

## 2020-01-04 ENCOUNTER — Ambulatory Visit (AMBULATORY_SURGERY_CENTER): Payer: BC Managed Care – PPO | Admitting: Internal Medicine

## 2020-01-04 ENCOUNTER — Encounter: Payer: Self-pay | Admitting: Internal Medicine

## 2020-01-04 ENCOUNTER — Other Ambulatory Visit: Payer: Self-pay

## 2020-01-04 VITALS — BP 108/51 | HR 64 | Temp 97.5°F | Resp 17 | Ht 67.0 in | Wt 210.0 lb

## 2020-01-04 DIAGNOSIS — K5730 Diverticulosis of large intestine without perforation or abscess without bleeding: Secondary | ICD-10-CM

## 2020-01-04 DIAGNOSIS — K219 Gastro-esophageal reflux disease without esophagitis: Secondary | ICD-10-CM

## 2020-01-04 DIAGNOSIS — K649 Unspecified hemorrhoids: Secondary | ICD-10-CM | POA: Diagnosis not present

## 2020-01-04 DIAGNOSIS — K449 Diaphragmatic hernia without obstruction or gangrene: Secondary | ICD-10-CM | POA: Diagnosis not present

## 2020-01-04 DIAGNOSIS — D123 Benign neoplasm of transverse colon: Secondary | ICD-10-CM | POA: Diagnosis not present

## 2020-01-04 DIAGNOSIS — D509 Iron deficiency anemia, unspecified: Secondary | ICD-10-CM | POA: Diagnosis not present

## 2020-01-04 DIAGNOSIS — K317 Polyp of stomach and duodenum: Secondary | ICD-10-CM

## 2020-01-04 DIAGNOSIS — R935 Abnormal findings on diagnostic imaging of other abdominal regions, including retroperitoneum: Secondary | ICD-10-CM

## 2020-01-04 MED ORDER — ESOMEPRAZOLE MAGNESIUM 40 MG PO PACK
40.0000 mg | PACK | Freq: Two times a day (BID) | ORAL | 0 refills | Status: DC
Start: 2020-01-04 — End: 2020-02-16

## 2020-01-04 MED ORDER — SODIUM CHLORIDE 0.9 % IV SOLN: 500.0000 mL | Freq: Once | INTRAVENOUS | Status: DC

## 2020-01-04 NOTE — Progress Notes (Signed)
PT taken to PACU. Monitors in place. VSS. Report given to RN. 

## 2020-01-04 NOTE — Op Note (Addendum)
[place] Patient Name: Angelica Sharp Procedure Date: 01/04/2020 2:07 PM MRN: [id number] Endoscopist: Jerene Bears , MD Age: 54 Referring MD:  Date of Birth: [birth year] Gender: Female Account #: [id number] Procedure:                Upper GI endoscopy Indications:              Upper abdominal pain, Iron deficiency anemia,                            Gastro-esophageal reflux disease, Abnormal CT of                            the GI tract showing esophageal thickening Medicines:                Monitored Anesthesia Care Procedure:                Pre-Anesthesia Assessment:                           - Prior to the procedure, a History and Physical                            was performed, and patient medications and                            allergies were reviewed. The patient's tolerance of                            previous anesthesia was also reviewed. The risks                            and benefits of the procedure and the sedation                            options and risks were discussed with the patient.                            All questions were answered, and informed consent                            was obtained. Prior Anticoagulants: The patient has                            taken no previous anticoagulant or antiplatelet                            agents. ASA Grade Assessment: II - A patient with                            mild systemic disease. After reviewing the risks                            and benefits, the patient was deemed in  satisfactory condition to undergo the procedure.                           After obtaining informed consent, the endoscope was                            passed under direct vision. Throughout the                            procedure, the patient's blood pressure, pulse, and                            oxygen saturations were monitored continuously. The                            Endoscope was  introduced through the mouth, and                            advanced to the second part of duodenum. The upper                            GI endoscopy was accomplished without difficulty.                            The patient tolerated the procedure well. Scope In: Scope Out: Findings:                 The Z-line was irregular and was found 38 cm from                            the incisors. Biopsies were taken with a cold                            forceps to exclude Barrett's esophagus (if present                            this is short-segment, C0M1) for histology.                           A 1 cm sliding-type hiatal hernia was present.                           Multiple small sessile polyps were found in the                            gastric fundus and in the gastric body. These have                            the typical appearance of benign fundic gland                            polyps. Biopsies were taken from multiple polyps  for sampling with a cold forceps for histology.                           The entire examined stomach was otherwise normal.                            Biopsies were taken with a cold forceps for                            histology and Helicobacter pylori testing given                            iron deficiency..                           The examined duodenum was normal. Biopsies for                            histology were taken with a cold forceps for                            evaluation of celiac disease. Complications:            No immediate complications. Estimated Blood Loss:     Estimated blood loss was minimal. Impression:               - Z-line irregular, 38 cm from the incisors.                            Biopsied.                           - 1 cm hiatal hernia.                           - Multiple, benign, gastric polyps. Biopsied.                           - Normal stomach. Biopsied.                           -  Normal examined duodenum. Biopsied. Recommendation:           - Patient has a contact number available for                            emergencies. The signs and symptoms of potential                            delayed complications were discussed with the                            patient. Return to normal activities tomorrow.                            Written discharge instructions were provided to the  patient.                           - Resume previous diet.                           - Continue present medications.                           - Await pathology results.                           - See the other procedure note for documentation of                            additional recommendations. Jerene Bears, MD 01/04/2020 2:56:32 PM This report has been signed electronically. Addendum Number: 1   Addendum Date: 01/04/2020 4:22:50 PM      During recovery room conversation the patient reported improved in upper       abdominal pain. She is having frequent throat clearing symptoms.       Heartburn overall is improved. She is using Nexium 40 mg daily.      I recommended trial of Nexium 40 mg BID-AC to see if throating clearing       symptom is secondary to uncontrolled acid reflux.      She will try this x 1 month and then let me know if symptoms improve.      She will need follow-up appointment in 3-4 months and further       recommendations to be based on follow-up iron studies and pathology       results. Jerene Bears, MD 01/04/2020 4:24:39 PM This report has been signed electronically.

## 2020-01-04 NOTE — Progress Notes (Signed)
Called to room to assist during endoscopic procedure.  Patient ID and intended procedure confirmed with present staff. Received instructions for my participation in the procedure from the performing physician.  

## 2020-01-04 NOTE — Op Note (Signed)
[place] Patient Name: Angelica Sharp Procedure Date: 01/04/2020 2:06 PM MRN: [id number] Endoscopist: Jerene Bears , MD Age: 54 Referring MD:  Date of Birth: 05/03/1966 Gender: Female Account #: [id number] Procedure:                Colonoscopy Indications:              Iron deficiency anemia Medicines:                Monitored Anesthesia Care Procedure:                Pre-Anesthesia Assessment:                           - Prior to the procedure, a History and Physical                            was performed, and patient medications and                            allergies were reviewed. The patient's tolerance of                            previous anesthesia was also reviewed. The risks                            and benefits of the procedure and the sedation                            options and risks were discussed with the patient.                            All questions were answered, and informed consent                            was obtained. Prior Anticoagulants: The patient has                            taken no previous anticoagulant or antiplatelet                            agents. ASA Grade Assessment: II - A patient with                            mild systemic disease. After reviewing the risks                            and benefits, the patient was deemed in                            satisfactory condition to undergo the procedure.                           After obtaining informed consent, the colonoscope  was passed under direct vision. Throughout the                            procedure, the patient's blood pressure, pulse, and                            oxygen saturations were monitored continuously. The                            Colonoscope was introduced through the anus and                            advanced to the cecum, identified by appendiceal                            orifice and ileocecal valve. The  colonoscopy was                            performed without difficulty. The patient tolerated                            the procedure well. The quality of the bowel                            preparation was good. The ileocecal valve,                            appendiceal orifice, and rectum were photographed. Scope In: 2:32:07 PM Scope Out: 2:47:46 PM Scope Withdrawal Time: 0 hours 11 minutes 4 seconds  Total Procedure Duration: 0 hours 15 minutes 39 seconds  Findings:                 A 6 mm polyp was found in the transverse colon. The                            polyp was sessile. The polyp was removed with a                            cold snare. Resection and retrieval were complete.                           Multiple small-mouthed diverticula were found in                            the sigmoid colon, descending colon and transverse                            colon.                           Retroflexion in the rectum was not performed due to                            narrow rectal vault. No evidence of abnormality                            (  other than hemorrhoids as below) in the rectum or                            anal canal.                           Internal hemorrhoids were found. The hemorrhoids                            were medium-sized. Complications:            No immediate complications. Estimated Blood Loss:     Estimated blood loss was minimal. Impression:               - One 6 mm polyp in the transverse colon, removed                            with a cold snare. Resected and retrieved.                           - Diverticulosis in the sigmoid colon, in the                            descending colon and in the transverse colon.                           - Internal hemorrhoids. Recommendation:           - Patient has a contact number available for                            emergencies. The signs and symptoms of potential                            delayed  complications were discussed with the                            patient. Return to normal activities tomorrow.                            Written discharge instructions were provided to the                            patient.                           - Resume previous diet.                           - Continue present medications including iron                            supplementation.                           - Await pathology results.                           -  Repeat CBC, ferritin + IBC in 1 month.                           - Video capsule endoscopy to be considered based on                            pathology results and response to oral iron                            supplementation.                           - Repeat colonoscopy is recommended. The                            colonoscopy date will be determined after pathology                            results from today's exam become available for                            review. Jerene Bears, MD 01/04/2020 3:01:10 PM This report has been signed electronically.

## 2020-01-04 NOTE — Patient Instructions (Addendum)
YOU HAD AN ENDOSCOPIC PROCEDURE TODAY AT [facility]:   Refer to the procedure report that was given to you for any specific questions about what was found during the examination.  If the procedure report does not answer your questions, please call your gastroenterologist to clarify.  If you requested that your care partner not be given the details of your procedure findings, then the procedure report has been included in a sealed envelope for you to review at your convenience later.  YOU SHOULD EXPECT: Some feelings of bloating in the abdomen. Passage of more gas than usual.  Walking can help get rid of the air that was put into your GI tract during the procedure and reduce the bloating. If you had a lower endoscopy (such as a colonoscopy or flexible sigmoidoscopy) you may notice spotting of blood in your stool or on the toilet paper. If you underwent a bowel prep for your procedure, you may not have a normal bowel movement for a few days.  Please Note:  You might notice some irritation and congestion in your nose or some drainage.  This is from the oxygen used during your procedure.  There is no need for concern and it should clear up in a day or so.  SYMPTOMS TO REPORT IMMEDIATELY:   Following lower endoscopy (colonoscopy or flexible sigmoidoscopy):  Excessive amounts of blood in the stool  Significant tenderness or worsening of abdominal pains  Swelling of the abdomen that is new, acute  Fever of 100F or higher   Following upper endoscopy (EGD)  Vomiting of blood or coffee ground material  New chest pain or pain under the shoulder blades  Painful or persistently difficult swallowing  New shortness of breath  Fever of 100F or higher  Black, tarry-looking stools  For urgent or emergent issues, a gastroenterologist can be reached at any hour by calling [phone]. Do not use MyChart messaging for urgent concerns.    DIET:  We do recommend a small meal at first, but  then you may proceed to your regular diet.  Drink plenty of fluids but you should avoid alcoholic beverages for 24 hours.  MEDICATIONS: Continue present medications including iron supplementation. Also, Increase Nexium to 40 mg by mouth twice daily. RX has been electronically sent to your preferred pharmacy.  FOLLOW UP: Repeat CBD, ferritin and IBC in 1 month. Consider video capsule endoscopy based on pathology results and response to oral iron supplementation. Dr. Hilarie Fredrickson will discuss this with you once the pathology results are returned.  Please see handouts given to you by your recovery nurse.  ACTIVITY:  You should plan to take it easy for the rest of today and you should NOT DRIVE or use heavy machinery until tomorrow (because of the sedation medicines used during the test).    FOLLOW UP: Our staff will call the number listed on your records 48-72 hours following your procedure to check on you and address any questions or concerns that you may have regarding the information given to you following your procedure. If we do not reach you, we will leave a message.  We will attempt to reach you two times.  During this call, we will ask if you have developed any symptoms of COVID 19. If you develop any symptoms (ie: fever, flu-like symptoms, shortness of breath, cough etc.) before then, please call [phone].  If you test positive for Covid 19 in the 2 weeks post procedure, please call and report this information  to [place].    If any biopsies were taken you will be contacted by phone or by letter within the next 1-3 weeks.  Please call us at [phone] if you have not heard about the biopsies in 3 weeks.   Thank you for allowing [place] to provide for your healthcare needs today.   SIGNATURES/CONFIDENTIALITY: You and/or your care partner have signed paperwork which will be entered into your electronic medical record.  These signatures attest to the fact that that the information above on your After  Visit Summary has been reviewed and is understood.  Full responsibility of the confidentiality of this discharge information lies with you and/or your care-partner.

## 2020-01-06 ENCOUNTER — Telehealth: Payer: Self-pay

## 2020-01-06 NOTE — Telephone Encounter (Signed)
  Follow up Call-  Call back number 01/04/2020  Post procedure Call Back phone  # [phone]  Permission to leave phone message Yes  Some recent data might be hidden     Patient questions:  Do you have a fever, pain , or abdominal swelling? No. Pain Score  0 *  Have you tolerated food without any problems? Yes.    Have you been able to return to your normal activities? Yes.    Do you have any questions about your discharge instructions: Diet   No. Medications  No. Follow up visit  No.  Do you have questions or concerns about your Care? No.  Actions: * If pain score is 4 or above: No action needed, pain <4.  1. Have you developed a fever since your procedure? no  2.   Have you had an respiratory symptoms (SOB or cough) since your procedure? no  3.   Have you tested positive for COVID 19 since your procedure no  4.   Have you had any family members/close contacts diagnosed with the COVID 19 since your procedure?  no   If yes to any of these questions please route to Joylene John, RN and Erenest Rasher, RN

## 2020-01-11 ENCOUNTER — Encounter: Payer: Self-pay | Admitting: Internal Medicine

## 2020-01-12 ENCOUNTER — Other Ambulatory Visit: Payer: Self-pay

## 2020-01-12 DIAGNOSIS — D509 Iron deficiency anemia, unspecified: Secondary | ICD-10-CM

## 2020-01-13 DIAGNOSIS — R002 Palpitations: Secondary | ICD-10-CM | POA: Diagnosis not present

## 2020-01-19 ENCOUNTER — Other Ambulatory Visit: Payer: Self-pay

## 2020-01-19 DIAGNOSIS — D509 Iron deficiency anemia, unspecified: Secondary | ICD-10-CM

## 2020-01-19 NOTE — Telephone Encounter (Signed)
Patient preference not to proceed with VCE is noted. Let patient know she can return for CBC, IBC+ferritin, continue oral iron Hold on VCE for now pending results of iron studies Thanks JMP

## 2020-01-20 ENCOUNTER — Other Ambulatory Visit (INDEPENDENT_AMBULATORY_CARE_PROVIDER_SITE_OTHER): Payer: BC Managed Care – PPO

## 2020-01-20 DIAGNOSIS — D509 Iron deficiency anemia, unspecified: Secondary | ICD-10-CM

## 2020-01-20 LAB — CBC WITH DIFFERENTIAL/PLATELET
Basophils Absolute: 0.1 10*3/uL (ref 0.0–0.1)
Basophils Relative: 1.1 % (ref 0.0–3.0)
Eosinophils Absolute: 0.2 10*3/uL (ref 0.0–0.7)
Eosinophils Relative: 2.2 % (ref 0.0–5.0)
HCT: 39.9 % (ref 36.0–46.0)
Hemoglobin: 12.3 g/dL (ref 12.0–15.0)
Lymphocytes Relative: 19.5 % (ref 12.0–46.0)
Lymphs Abs: 1.7 10*3/uL (ref 0.7–4.0)
MCHC: 30.8 g/dL (ref 30.0–36.0)
MCV: 74.9 fl — ABNORMAL LOW (ref 78.0–100.0)
Monocytes Absolute: 0.4 10*3/uL (ref 0.1–1.0)
Monocytes Relative: 4.2 % (ref 3.0–12.0)
Neutro Abs: 6.2 10*3/uL (ref 1.4–7.7)
Neutrophils Relative %: 73 % (ref 43.0–77.0)
Platelets: 260 10*3/uL (ref 150.0–400.0)
RBC: 5.33 Mil/uL — ABNORMAL HIGH (ref 3.87–5.11)
RDW: 22.1 % — ABNORMAL HIGH (ref 11.5–15.5)
WBC: 8.5 10*3/uL (ref 4.0–10.5)

## 2020-01-20 LAB — IBC + FERRITIN
Ferritin: 18.9 ng/mL (ref 10.0–291.0)
Iron: 34 ug/dL — ABNORMAL LOW (ref 42–145)
Saturation Ratios: 6.4 % — ABNORMAL LOW (ref 20.0–50.0)
Transferrin: 382 mg/dL — ABNORMAL HIGH (ref 212.0–360.0)

## 2020-01-21 ENCOUNTER — Other Ambulatory Visit: Payer: Self-pay

## 2020-01-21 DIAGNOSIS — D509 Iron deficiency anemia, unspecified: Secondary | ICD-10-CM

## 2020-01-27 ENCOUNTER — Other Ambulatory Visit: Payer: Self-pay | Admitting: Family Medicine

## 2020-01-27 DIAGNOSIS — I10 Essential (primary) hypertension: Secondary | ICD-10-CM

## 2020-01-27 DIAGNOSIS — R609 Edema, unspecified: Secondary | ICD-10-CM

## 2020-01-27 NOTE — Telephone Encounter (Signed)
Requested Prescriptions  Pending Prescriptions Disp Refills  . hydrochlorothiazide (HYDRODIURIL) 25 MG tablet [Pharmacy Med Name: hydroCHLOROthiazide 25 MG Oral Tablet] 90 tablet 0    Sig: Take 1 tablet by mouth once daily     Cardiovascular: Diuretics - Thiazide Failed - 01/27/2020  8:00 PM      Failed - Cr in normal range and within 360 days    Creat  Date Value Ref Range Status  05/10/2015 1.01 0.50 - 1.05 mg/dL Final   Creatinine, Ser  Date Value Ref Range Status  11/24/2019 1.04 (H) 0.57 - 1.00 mg/dL Final         Passed - Ca in normal range and within 360 days    Calcium  Date Value Ref Range Status  11/24/2019 9.0 8.7 - 10.2 mg/dL Final         Passed - K in normal range and within 360 days    Potassium  Date Value Ref Range Status  11/24/2019 4.0 3.5 - 5.2 mmol/L Final         Passed - Na in normal range and within 360 days    Sodium  Date Value Ref Range Status  11/24/2019 138 134 - 144 mmol/L Final         Passed - Last BP in normal range    BP Readings from Last 1 Encounters:  01/04/20 (!) 108/51         Passed - Valid encounter within last 6 months    Recent Outpatient Visits          2 months ago Palpitations   [facility] [place], NP   2 months ago Lower abdominal pain   [facility], Delfino Lovett, NP   4 months ago Annual physical exam   [facility], Ranell Patrick, MD   11 months ago Cough   [facility], Ines Bloomer, MD   1 year ago Annual physical exam   [facility], Ranell Patrick, MD      Future Appointments            In 1 week Gwenlyn Found Pearletha Forge, MD [place], [facility]

## 2020-02-09 ENCOUNTER — Other Ambulatory Visit: Payer: Self-pay

## 2020-02-09 ENCOUNTER — Encounter: Payer: Self-pay | Admitting: Cardiovascular Disease

## 2020-02-09 ENCOUNTER — Ambulatory Visit: Payer: BC Managed Care – PPO | Admitting: Cardiovascular Disease

## 2020-02-09 VITALS — BP 124/84 | HR 88 | Ht 67.0 in | Wt 211.4 lb

## 2020-02-09 DIAGNOSIS — R002 Palpitations: Secondary | ICD-10-CM | POA: Diagnosis not present

## 2020-02-09 NOTE — Progress Notes (Signed)
Ms. Wires returns today for follow-up of her outpatient test done in the evaluation of dyspnea, atypical chest pain and palpitations.  Since being on iron repletion her symptoms have pretty much resolved.  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  An event monitor showed occasional PACs and short runs of SVT and her coronary calcium score was 0 suggesting her pain probably was not cardiovascular in nature.  I am getting a 2D echo in a year to look at her thoracic aorta and see her back after that.  She has no symptoms of toenail and then I will see her back as needed except for annual 2D echocardiograms.  Lorretta Harp, M.D., [place], [facility], [facility] [address] [place], [place]  [address]  [phone] 02/09/2020 5:29 PM

## 2020-02-09 NOTE — Patient Instructions (Signed)
Medication Instructions:  Your physician recommends that you continue on your current medications as directed. Please refer to the Current Medication list given to you today.  *If you need a refill on your cardiac medications before your next appointment, please call your pharmacy*   Lab Work: NONE If you have labs (blood work) drawn today and your tests are completely normal, you will receive your results only by: Marland Kitchen MyChart Message (if you have MyChart) OR . A paper copy in the mail If you have any lab test that is abnormal or we need to change your treatment, we will call you to review the results.   Testing/Procedures: Your physician has requested that you have an echocardiogram. Echocardiography is a painless test that uses sound waves to create images of your heart. It provides your doctor with information about the size and shape of your heart and how well your heart's chambers and valves are working. This procedure takes approximately one hour. There are no restrictions for this procedure. TO BE DONE IN AUGUST 2020   Follow-Up: At [facility], you and your health needs are our priority.  As part of our continuing mission to provide you with exceptional heart care, we have created designated Provider Care Teams.  These Care Teams include your primary Cardiologist (physician) and Advanced Practice Providers (APPs -  Physician Assistants and Nurse Practitioners) who all work together to provide you with the care you need, when you need it.  We recommend signing up for the patient portal called "MyChart".  Sign up information is provided on this After Visit Summary.  MyChart is used to connect with patients for Virtual Visits (Telemedicine).  Patients are able to view lab/test results, encounter notes, upcoming appointments, etc.  Non-urgent messages can be sent to your provider as well.   To learn more about what you can do with MyChart, go to [email or web address].    Your next  appointment:   12 month(s)AFTER THE ECHO You will receive a reminder letter in the mail two months in advance. If you don't receive a letter, please call our office to schedule the follow-up appointment.  The format for your next appointment:   In Person  Provider:   You may see DR Gwenlyn Found  or one of the following Advanced Practice Providers on your designated Care Team:    Kerin Ransom, PA-C  [place], [place]  Coletta Memos, [place]

## 2020-02-10 ENCOUNTER — Other Ambulatory Visit: Payer: Self-pay | Admitting: *Deleted

## 2020-02-10 DIAGNOSIS — E119 Type 2 diabetes mellitus without complications: Secondary | ICD-10-CM | POA: Diagnosis not present

## 2020-02-10 DIAGNOSIS — I77810 Thoracic aortic ectasia: Secondary | ICD-10-CM

## 2020-02-10 DIAGNOSIS — I10 Essential (primary) hypertension: Secondary | ICD-10-CM | POA: Diagnosis not present

## 2020-02-10 DIAGNOSIS — E782 Mixed hyperlipidemia: Secondary | ICD-10-CM | POA: Diagnosis not present

## 2020-02-10 DIAGNOSIS — I7121 Aneurysm of the ascending aorta, without rupture: Secondary | ICD-10-CM

## 2020-02-10 DIAGNOSIS — I712 Thoracic aortic aneurysm, without rupture: Secondary | ICD-10-CM

## 2020-02-15 ENCOUNTER — Other Ambulatory Visit: Payer: Self-pay | Admitting: Internal Medicine

## 2020-02-17 ENCOUNTER — Encounter: Payer: Self-pay | Admitting: Registered Nurse

## 2020-02-17 ENCOUNTER — Telehealth (INDEPENDENT_AMBULATORY_CARE_PROVIDER_SITE_OTHER): Payer: BC Managed Care – PPO | Admitting: Registered Nurse

## 2020-02-17 ENCOUNTER — Other Ambulatory Visit: Payer: Self-pay

## 2020-02-17 VITALS — Temp 100.5°F

## 2020-02-17 DIAGNOSIS — R0602 Shortness of breath: Secondary | ICD-10-CM

## 2020-02-17 MED ORDER — GUAIFENESIN-DM 100-10 MG/5ML PO SYRP: 5.0000 mL | ORAL_SOLUTION | ORAL | 0 refills | Status: DC | PRN

## 2020-02-17 MED ORDER — ALBUTEROL SULFATE HFA 108 (90 BASE) MCG/ACT IN AERS: 2.0000 | INHALATION_SPRAY | Freq: Four times a day (QID) | RESPIRATORY_TRACT | 0 refills | Status: DC | PRN

## 2020-02-18 ENCOUNTER — Encounter: Payer: Self-pay | Admitting: Registered Nurse

## 2020-02-20 DIAGNOSIS — Z20822 Contact with and (suspected) exposure to covid-19: Secondary | ICD-10-CM | POA: Diagnosis not present

## 2020-02-20 IMAGING — DX PORTABLE PELVIS 1-2 VIEWS
2 series · 2 of 2 positions shown · non-contrast
Comparison: 07/25/2018 MR

CLINICAL DATA: LEFT hip arthroplasty

EXAM:
PORTABLE PELVIS 1-2 VIEWS

[pelvis ap (1 of 2)]
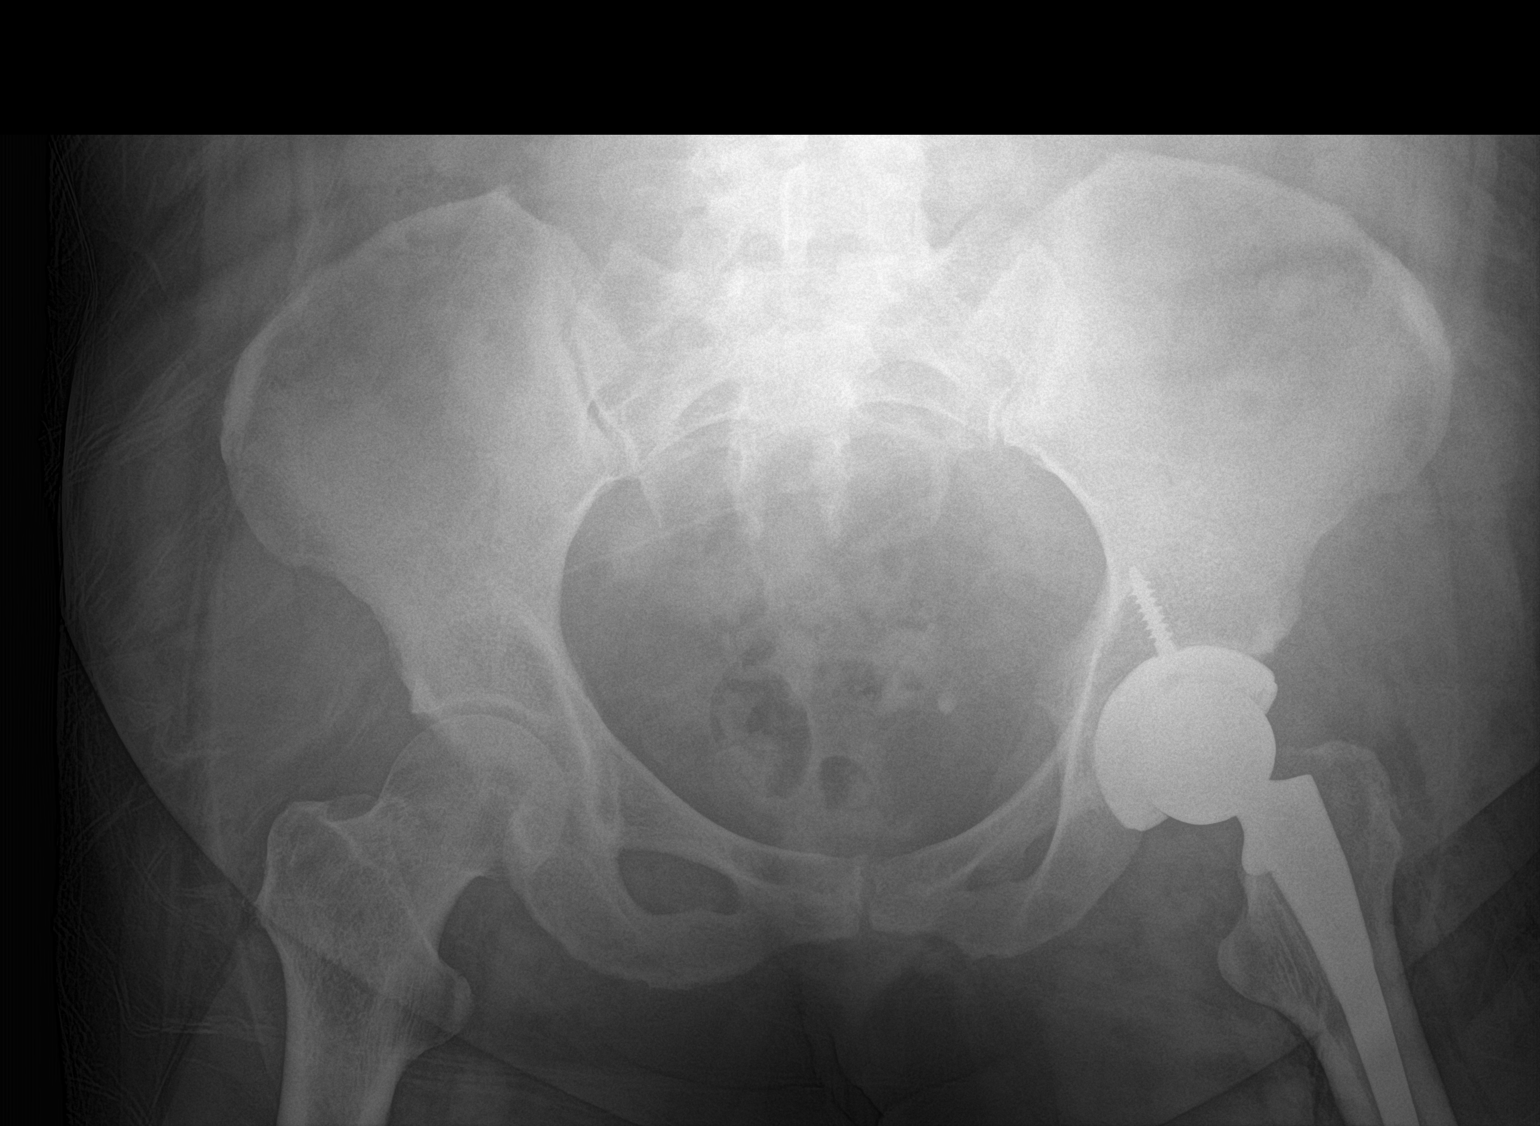

[pelvis ap (2 of 2)]
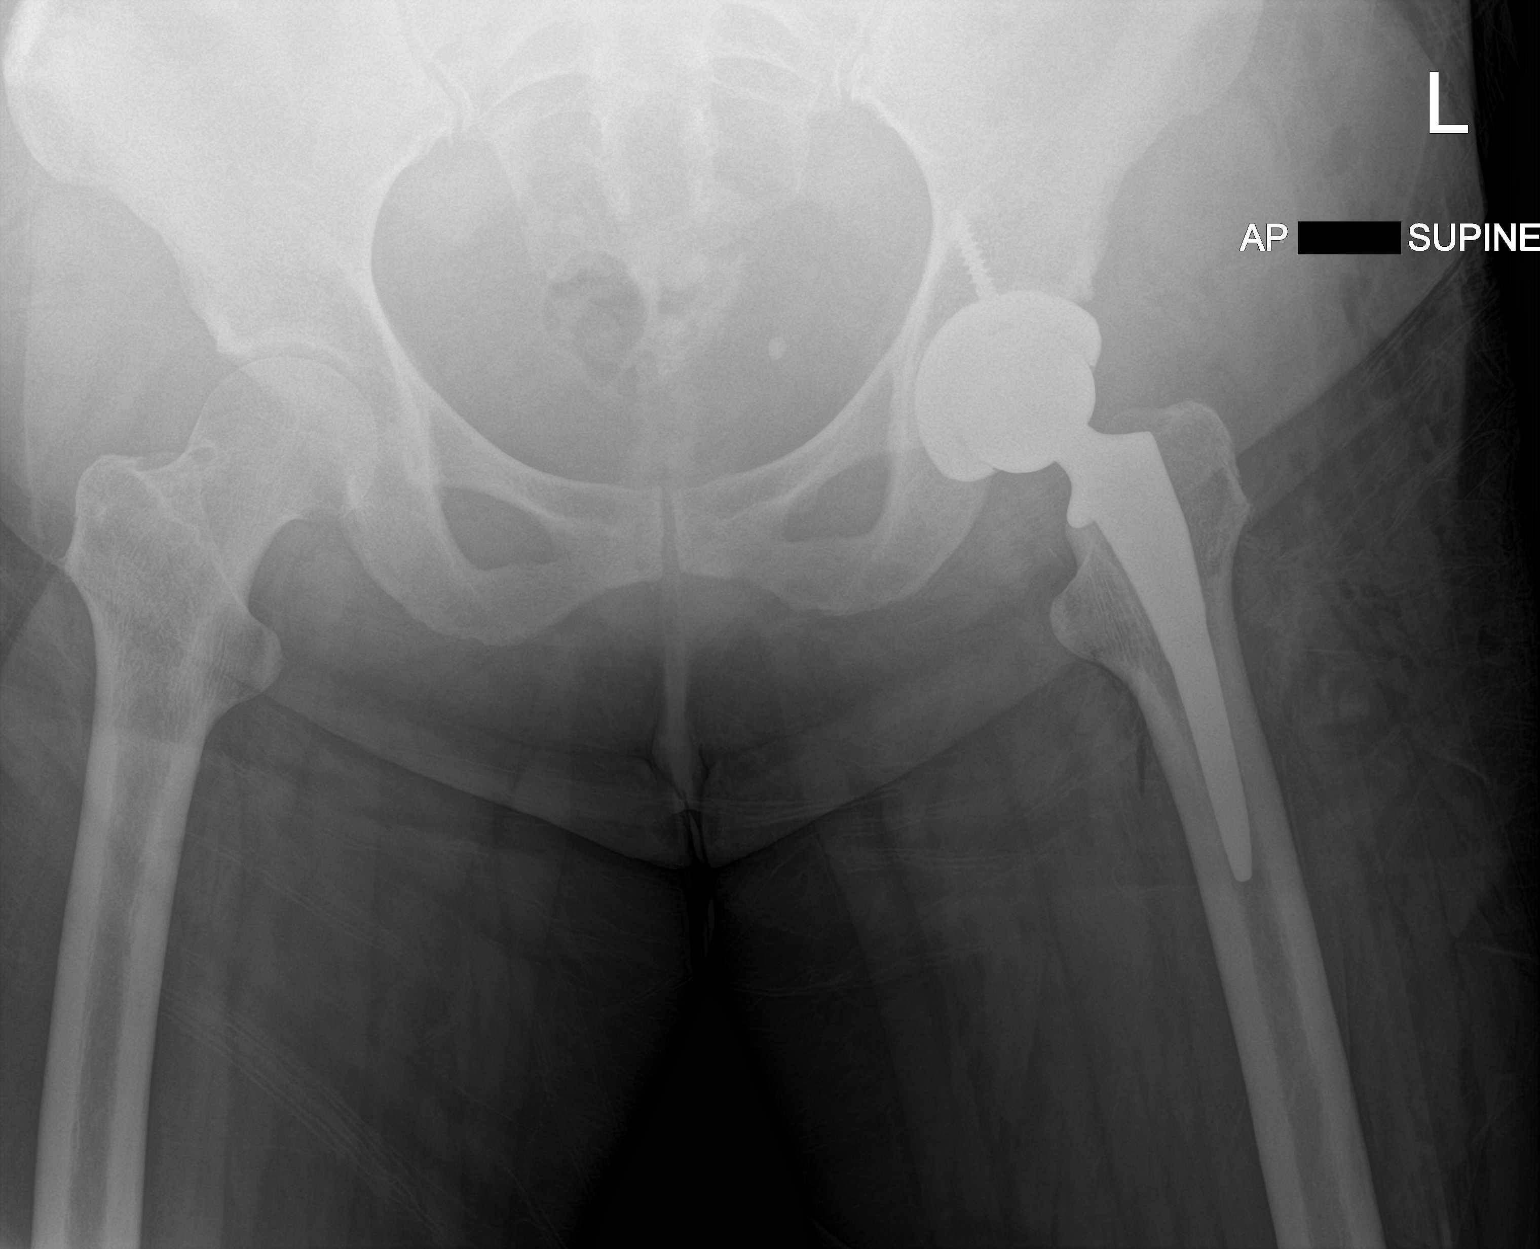

[2 of 2 positions shown; findings below may reference images not displayed]

FINDINGS: LEFT total hip arthroplasty identified.

No acute fracture or dislocation.

No complicating features are identified.
IMPRESSION: LEFT total hip arthroplasty without complicating features.

## 2020-02-22 ENCOUNTER — Encounter: Payer: Self-pay | Admitting: Registered Nurse

## 2020-02-22 DIAGNOSIS — Z20822 Contact with and (suspected) exposure to covid-19: Secondary | ICD-10-CM | POA: Diagnosis not present

## 2020-02-23 ENCOUNTER — Other Ambulatory Visit: Payer: Self-pay | Admitting: Registered Nurse

## 2020-02-24 ENCOUNTER — Ambulatory Visit (HOSPITAL_COMMUNITY)
Admission: RE | Admit: 2020-02-24 | Discharge: 2020-02-24 | Disposition: A | Discharge status: Home / Self Care | Payer: BC Managed Care – PPO | Source: Ambulatory Visit | Attending: Pulmonary Disease | Admitting: Pulmonary Disease

## 2020-02-24 ENCOUNTER — Other Ambulatory Visit: Payer: Self-pay | Admitting: Infectious Diseases

## 2020-02-24 ENCOUNTER — Other Ambulatory Visit: Payer: Self-pay | Admitting: Registered Nurse

## 2020-02-24 DIAGNOSIS — E782 Mixed hyperlipidemia: Secondary | ICD-10-CM | POA: Insufficient documentation

## 2020-02-24 DIAGNOSIS — E119 Type 2 diabetes mellitus without complications: Secondary | ICD-10-CM | POA: Diagnosis not present

## 2020-02-24 DIAGNOSIS — I10 Essential (primary) hypertension: Secondary | ICD-10-CM | POA: Insufficient documentation

## 2020-02-24 DIAGNOSIS — R110 Nausea: Secondary | ICD-10-CM

## 2020-02-24 DIAGNOSIS — U071 COVID-19: Secondary | ICD-10-CM

## 2020-02-24 MED ORDER — FAMOTIDINE IN NACL 20-0.9 MG/50ML-% IV SOLN: 20.0000 mg | Freq: Once | INTRAVENOUS | Status: DC | PRN

## 2020-02-24 MED ORDER — METHYLPREDNISOLONE SODIUM SUCC 125 MG IJ SOLR: 125.0000 mg | Freq: Once | INTRAMUSCULAR | Status: DC | PRN

## 2020-02-24 MED ORDER — SODIUM CHLORIDE 0.9 % IV SOLN: INTRAVENOUS | Status: DC | PRN

## 2020-02-24 MED ORDER — ONDANSETRON HCL 4 MG/2ML IJ SOLN
4.0000 mg | Freq: Once | INTRAMUSCULAR | Status: AC
  Administered 2020-02-24: 4 mg via INTRAVENOUS
  Filled 2020-02-24: qty 2

## 2020-02-24 MED ORDER — EPINEPHRINE 0.3 MG/0.3ML IJ SOAJ: 0.3000 mg | Freq: Once | INTRAMUSCULAR | Status: DC | PRN

## 2020-02-24 MED ORDER — ONDANSETRON HCL 8 MG PO TABS: 8.0000 mg | ORAL_TABLET | Freq: Three times a day (TID) | ORAL | 0 refills | Status: DC | PRN

## 2020-02-24 MED ORDER — ONDANSETRON 4 MG PO TBDP: 4.0000 mg | ORAL_TABLET | Freq: Three times a day (TID) | ORAL | 0 refills | Status: DC | PRN

## 2020-02-24 MED ORDER — SODIUM CHLORIDE 0.9 % IV SOLN
1200.0000 mg | Freq: Once | INTRAVENOUS | Status: AC
  Administered 2020-02-24: 1200 mg via INTRAVENOUS
  Filled 2020-02-24: qty 10

## 2020-02-24 MED ORDER — ALBUTEROL SULFATE HFA 108 (90 BASE) MCG/ACT IN AERS: 2.0000 | INHALATION_SPRAY | Freq: Once | RESPIRATORY_TRACT | Status: DC | PRN

## 2020-02-24 MED ORDER — DIPHENHYDRAMINE HCL 50 MG/ML IJ SOLN: 50.0000 mg | Freq: Once | INTRAMUSCULAR | Status: DC | PRN

## 2020-02-24 NOTE — Progress Notes (Signed)
  Diagnosis: COVID-19  Physician:Dr Wright  Procedure: [facility] Med: casirivimab\imdevimab infusion - Provided patient with casirivimab\imdevimab fact sheet for patients, parents and caregivers prior to infusion.  Complications: No immediate complications noted.  Discharge: Discharged home   Angelica Sharp 02/24/2020  

## 2020-02-24 NOTE — Discharge Instructions (Signed)
10 Things You Can Do to Manage Your COVID-19 Symptoms at Home If you have possible or confirmed COVID-19: 1. Stay home from work and school. And stay away from other public places. If you must go out, avoid using any kind of public transportation, ridesharing, or taxis. 2. Monitor your symptoms carefully. If your symptoms get worse, call your healthcare provider immediately. 3. Get rest and stay hydrated. 4. If you have a medical appointment, call the healthcare provider ahead of time and tell them that you have or may have COVID-19. 5. For medical emergencies, call 911 and notify the dispatch personnel that you have or may have COVID-19. 6. Cover your cough and sneezes with a tissue or use the inside of your elbow. 7. Wash your hands often with soap and water for at least 20 seconds or clean your hands with an alcohol-based hand sanitizer that contains at least 60% alcohol. 8. As much as possible, stay in a specific room and away from other people in your home. Also, you should use a separate bathroom, if available. If you need to be around other people in or outside of the home, wear a mask. 9. Avoid sharing personal items with other people in your household, like dishes, towels, and bedding. 10. Clean all surfaces that are touched often, like counters, tabletops, and doorknobs. Use household cleaning sprays or wipes according to the label instructions. What types of side effects do monoclonal antibody drugs cause?  Common side effects  In general, the more common side effects caused by monoclonal antibody drugs include: Allergic reactions, such as hives or itching Flu-like signs and symptoms, including chills, fatigue, fever, and muscle aches and pains Nausea, vomiting Diarrhea Skin rashes Low blood pressure   The CDC is recommending patients who receive monoclonal antibody treatments wait at least 90 days before being vaccinated.  11. Currently, there are no data on the safety and  efficacy of mRNA COVID-19 vaccines in persons who received monoclonal antibodies or convalescent plasma as part of COVID-19 treatment. Based on the estimated half-life of such therapies as well as evidence suggesting that reinfection is uncommon in the 90 days after initial infection, vaccination should be deferred for at least 90 days, as a precautionary measure until additional information becomes available, to avoid interference of the antibody treatment with vaccine-induced immune responses. cdc.gov/coronavirus 12/10/2018 This information is not intended to replace advice given to you by your health care provider. Make sure you discuss any questions you have with your health care provider. Document Revised: 05/14/2019 Document Reviewed: 05/14/2019 Elsevier Patient Education  2020 Elsevier Inc.  

## 2020-02-24 NOTE — Progress Notes (Signed)
I connected by phone with Angelica Sharp on 02/24/2020 at 2:57 PM to discuss the potential use of a new treatment for mild to moderate COVID-19 viral infection in non-hospitalized patients.  This patient is a 54 y.o. female that meets the FDA criteria for Emergency Use Authorization of COVID monoclonal antibody casirivimab/imdevimab.  Has a (+) direct SARS-CoV-2 viral test result  Has mild or moderate COVID-19   Is NOT hospitalized due to COVID-19  Is within 10 days of symptom onset  Has at least one of the high risk factor(s) for progression to severe COVID-19 and/or hospitalization as defined in EUA.  Specific high risk criteria : BMI > 25 and Cardiovascular disease or hypertension   I have spoken and communicated the following to the patient or parent/caregiver regarding COVID monoclonal antibody treatment:  1. FDA has authorized the emergency use for the treatment of mild to moderate COVID-19 in adults and pediatric patients with positive results of direct SARS-CoV-2 viral testing who are 1 years of age and older weighing at least 40 kg, and who are at high risk for progressing to severe COVID-19 and/or hospitalization.  2. The significant known and potential risks and benefits of COVID monoclonal antibody, and the extent to which such potential risks and benefits are unknown.  3. Information on available alternative treatments and the risks and benefits of those alternatives, including clinical trials.  4. Patients treated with COVID monoclonal antibody should continue to self-isolate and use infection control measures (e.g., wear mask, isolate, social distance, avoid sharing personal items, clean and disinfect "high touch" surfaces, and frequent handwashing) according to CDC guidelines.   5. The patient or parent/caregiver has the option to accept or refuse COVID monoclonal antibody treatment.  After reviewing this information with the patient, The patient agreed to proceed with  receiving casirivimab\imdevimab infusion and will be provided a copy of the Fact sheet prior to receiving the infusion. [name] 02/24/2020 2:57 PM

## 2020-02-28 ENCOUNTER — Encounter: Payer: Self-pay | Admitting: Registered Nurse

## 2020-03-02 NOTE — Telephone Encounter (Signed)
Please Advise

## 2020-03-02 NOTE — Telephone Encounter (Signed)
Message sent thru MyChart 

## 2020-03-04 ENCOUNTER — Ambulatory Visit (INDEPENDENT_AMBULATORY_CARE_PROVIDER_SITE_OTHER): Payer: BC Managed Care – PPO | Admitting: Registered Nurse

## 2020-03-04 ENCOUNTER — Other Ambulatory Visit: Payer: Self-pay

## 2020-03-04 ENCOUNTER — Ambulatory Visit (INDEPENDENT_AMBULATORY_CARE_PROVIDER_SITE_OTHER): Payer: BC Managed Care – PPO

## 2020-03-04 VITALS — BP 122/78 | HR 86 | Temp 97.6°F | Ht 67.0 in | Wt 199.4 lb

## 2020-03-04 DIAGNOSIS — R0602 Shortness of breath: Secondary | ICD-10-CM

## 2020-03-04 DIAGNOSIS — U071 COVID-19: Secondary | ICD-10-CM

## 2020-03-04 DIAGNOSIS — J1282 Pneumonia due to coronavirus disease 2019: Secondary | ICD-10-CM

## 2020-03-04 DIAGNOSIS — R5383 Other fatigue: Secondary | ICD-10-CM | POA: Diagnosis not present

## 2020-03-04 MED ORDER — AMOXICILLIN-POT CLAVULANATE 875-125 MG PO TABS: 1.0000 | ORAL_TABLET | Freq: Two times a day (BID) | ORAL | 0 refills | Status: DC

## 2020-03-04 MED ORDER — PREDNISONE 10 MG (21) PO TBPK: ORAL_TABLET | ORAL | 0 refills | Status: DC

## 2020-03-04 NOTE — Patient Instructions (Signed)
° ° ° °  If you have lab work done today you will be contacted with your lab results within the next 2 weeks.  If you have not heard from us then please contact us. The fastest way to get your results is to register for My Chart. ° ° °IF you received an x-ray today, you will receive an invoice from [facility]. Please contact  Radiology at [phone] with questions or concerns regarding your invoice.  ° °IF you received labwork today, you will receive an invoice from [facility]. Please contact [facility] at [phone] with questions or concerns regarding your invoice.  ° °Our billing staff will not be able to assist you with questions regarding bills from these companies. ° °You will be contacted with the lab results as soon as they are available. The fastest way to get your results is to activate your My Chart account. Instructions are located on the last page of this paperwork. If you have not heard from us regarding the results in 2 weeks, please contact this office. °  ° ° ° °

## 2020-03-05 LAB — CBC WITH DIFFERENTIAL
Basophils Absolute: 0.1 10*3/uL (ref 0.0–0.2)
Basos: 1 %
EOS (ABSOLUTE): 0.1 10*3/uL (ref 0.0–0.4)
Eos: 1 %
Hematocrit: 44.7 % (ref 34.0–46.6)
Hemoglobin: 13.6 g/dL (ref 11.1–15.9)
Immature Grans (Abs): 0.1 10*3/uL (ref 0.0–0.1)
Immature Granulocytes: 1 %
Lymphocytes Absolute: 1.8 10*3/uL (ref 0.7–3.1)
Lymphs: 21 %
MCH: 23.5 pg — ABNORMAL LOW (ref 26.6–33.0)
MCHC: 30.4 g/dL — ABNORMAL LOW (ref 31.5–35.7)
MCV: 77 fL — ABNORMAL LOW (ref 79–97)
Monocytes Absolute: 0.5 10*3/uL (ref 0.1–0.9)
Monocytes: 6 %
Neutrophils Absolute: 6.1 10*3/uL (ref 1.4–7.0)
Neutrophils: 70 %
RBC: 5.79 x10E6/uL — ABNORMAL HIGH (ref 3.77–5.28)
RDW: 16.1 % — ABNORMAL HIGH (ref 11.7–15.4)
WBC: 8.6 10*3/uL (ref 3.4–10.8)

## 2020-03-05 LAB — COMPREHENSIVE METABOLIC PANEL
ALT: 33 IU/L — ABNORMAL HIGH (ref 0–32)
AST: 30 IU/L (ref 0–40)
Albumin/Globulin Ratio: 1.5 (ref 1.2–2.2)
Albumin: 4.4 g/dL (ref 3.8–4.9)
Alkaline Phosphatase: 98 IU/L (ref 44–121)
BUN/Creatinine Ratio: 12 (ref 9–23)
BUN: 11 mg/dL (ref 6–24)
Bilirubin Total: <0.2 mg/dL (ref 0.0–1.2)
CO2: 21 mmol/L (ref 20–29)
Calcium: 9.5 mg/dL (ref 8.7–10.2)
Chloride: 106 mmol/L (ref 96–106)
Creatinine, Ser: 0.91 mg/dL (ref 0.57–1.00)
GFR calc Af Amer: 82 mL/min/{1.73_m2} (ref >59)
GFR calc non Af Amer: 71 mL/min/{1.73_m2} (ref >59)
Globulin, Total: 2.9 g/dL (ref 1.5–4.5)
Glucose: 125 mg/dL — ABNORMAL HIGH (ref 65–99)
Potassium: 4.4 mmol/L (ref 3.5–5.2)
Sodium: 145 mmol/L — ABNORMAL HIGH (ref 134–144)
Total Protein: 7.3 g/dL (ref 6.0–8.5)

## 2020-03-05 LAB — IRON,TIBC AND FERRITIN PANEL
Ferritin: 65 ng/mL (ref 15–150)
Iron Saturation: 16 % (ref 15–55)
Iron: 58 ug/dL (ref 27–159)
Total Iron Binding Capacity: 354 ug/dL (ref 250–450)
UIBC: 296 ug/dL (ref 131–425)

## 2020-03-05 LAB — TSH: TSH: 0.887 u[IU]/mL (ref 0.450–4.500)

## 2020-03-15 ENCOUNTER — Encounter: Payer: Self-pay | Admitting: Family Medicine

## 2020-03-17 ENCOUNTER — Telehealth: Payer: Self-pay | Admitting: Family Medicine

## 2020-03-17 ENCOUNTER — Encounter: Payer: Self-pay | Admitting: Family Medicine

## 2020-03-17 NOTE — Telephone Encounter (Signed)
Received paperwork from [place]. Paperwork is needing to be filled out be we send it off. Paperwork was left in providers box beside nurses station. Please advise.

## 2020-03-18 NOTE — Telephone Encounter (Signed)
Yes if you wouldn't mind printing them and putting them in my Red box that would be great, can fill them out by the end of the day  Kathrin Ruddy, NP

## 2020-03-18 NOTE — Telephone Encounter (Signed)
Short term disability forms from [employer] has been received and placed in covering providers folder to be completed. This form is from when she had COVID.   I have called the pt and informed her that we are working on it. I have also received some dates that would be helpful for the form. She was out of work from 02/17/20 until 9/21/2. She was took the test at [facility] on 02/20/20 and got an antibiotic infusion on 02/25/20. These forms should cover time that she was out without any pay. She is now back to work with full functions since 03/01/20.  Also, she had some lung inflammation and cough that she completed antibiotics this past weekend. She feels much better but unsure if a follow up appointment is needed. I have informed her we will give it another week and if she doesn't have increased improvement then make a f/u appointment. She stated understanding.

## 2020-03-23 ENCOUNTER — Other Ambulatory Visit: Payer: Self-pay

## 2020-03-23 ENCOUNTER — Ambulatory Visit: Payer: BC Managed Care – PPO | Admitting: Registered Nurse

## 2020-03-23 ENCOUNTER — Ambulatory Visit (INDEPENDENT_AMBULATORY_CARE_PROVIDER_SITE_OTHER): Payer: BC Managed Care – PPO

## 2020-03-23 ENCOUNTER — Encounter: Payer: Self-pay | Admitting: Registered Nurse

## 2020-03-23 VITALS — BP 136/85 | HR 90 | Temp 98.7°F | Resp 18 | Ht 67.0 in | Wt 205.0 lb

## 2020-03-23 DIAGNOSIS — R058 Other specified cough: Secondary | ICD-10-CM

## 2020-03-23 DIAGNOSIS — R350 Frequency of micturition: Secondary | ICD-10-CM | POA: Diagnosis not present

## 2020-03-23 DIAGNOSIS — R062 Wheezing: Secondary | ICD-10-CM | POA: Diagnosis not present

## 2020-03-23 DIAGNOSIS — R059 Cough, unspecified: Secondary | ICD-10-CM | POA: Diagnosis not present

## 2020-03-23 LAB — POCT URINALYSIS DIP (CLINITEK)
Bilirubin, UA: NEGATIVE
Blood, UA: NEGATIVE
Glucose, UA: NEGATIVE mg/dL
Ketones, POC UA: NEGATIVE mg/dL
Leukocytes, UA: NEGATIVE
Nitrite, UA: NEGATIVE
POC PROTEIN,UA: NEGATIVE
Spec Grav, UA: 1.015 (ref 1.010–1.025)
Urobilinogen, UA: 0.2 E.U./dL
pH, UA: 5.5 (ref 5.0–8.0)

## 2020-03-23 MED ORDER — MONTELUKAST SODIUM 10 MG PO TABS: 10.0000 mg | ORAL_TABLET | Freq: Every day | ORAL | 3 refills | Status: DC

## 2020-03-23 MED ORDER — QVAR REDIHALER 40 MCG/ACT IN AERB: 1.0000 | INHALATION_SPRAY | Freq: Two times a day (BID) | RESPIRATORY_TRACT | 2 refills | Status: DC

## 2020-03-23 NOTE — Patient Instructions (Signed)
° ° ° °  If you have lab work done today you will be contacted with your lab results within the next 2 weeks.  If you have not heard from us then please contact us. The fastest way to get your results is to register for My Chart. ° ° °IF you received an x-ray today, you will receive an invoice from [facility]. Please contact [facility] at [phone] with questions or concerns regarding your invoice.  ° °IF you received labwork today, you will receive an invoice from [facility]. Please contact [facility] at [phone] with questions or concerns regarding your invoice.  ° °Our billing staff will not be able to assist you with questions regarding bills from these companies. ° °You will be contacted with the lab results as soon as they are available. The fastest way to get your results is to activate your My Chart account. Instructions are located on the last page of this paperwork. If you have not heard from us regarding the results in 2 weeks, please contact this office. °  ° ° ° °

## 2020-03-23 NOTE — Progress Notes (Signed)
Acute Office Visit  Subjective:    Patient ID: Angelica Sharp, female    DOB: 1965-12-26, 54 y.o.   MRN: [id number]  Chief Complaint  Patient presents with  . Follow-up    Patient states she is here for a follow up from having Covid-19. Patient states she still have some trouble breathing and a cough.Per patient she has been having some frequent urniation as well.    HPI Patient is in today for ongoing difficulty breathing Does not get lightheaded- no loc But does have some coughing with green mucus and some shob  Occasional Relieved by albuterol mostly Concern for ongoing pna - finished course of augmentin without issue No other sick contacts No other symptoms - feeling well otherwise  Past Medical History:  Diagnosis Date  . Allergy   . Anemia   . Anxiety   . Arthritis   . Borderline hypertension   . Chest pain   . Complication of anesthesia   . Degenerative lumbar disc    L5S1  . Diabetes mellitus without complication ([place])   . Diverticulitis   . Dyspnea   . Edema    lower extremity  . Family history of anesthesia complication    sister had BP complications during anes.  . Fatty liver   . GERD (gastroesophageal reflux disease)   . H/O hiatal hernia   . Headache   . HLD (hyperlipidemia)   . Hypertension   . Pneumonia   . PONV (postoperative nausea and vomiting)   . SOB (shortness of breath)     Past Surgical History:  Procedure Laterality Date  . BREAST LUMPECTOMY     4 lumps -- two from right -- one from left  . LAPAROSCOPIC ASSISTED VAGINAL HYSTERECTOMY N/A 09/18/2012   Procedure: LAPAROSCOPIC ASSISTED VAGINAL HYSTERECTOMY;  Surgeon: Linda Hedges, DO;  Location: [facility];  Service: Gynecology;  Laterality: N/A;  BLADDER  DISTENTION  . TOTAL HIP ARTHROPLASTY Left 11/10/2018   Procedure: LEFT TOTAL HIP ARTHROPLASTY ANTERIOR APPROACH;  Surgeon: Leandrew Koyanagi, MD;  Location: [facility];  Service: Orthopedics;  Laterality: Left;  . TUBAL LIGATION      Family  History  Problem Relation Age of Onset  . Heart attack Mother   . Diabetes Mother   . Hyperlipidemia Mother   . Hypertension Mother   . Stroke Sister   . Pulmonary embolism Father   . Clotting disorder Father   . Diabetes Sister   . Kidney disease Sister        kidney failure  . Heart disease Sister   . Cancer Sister        abdominal  . Diabetes Son 5       TYPE 1  . Kidney cancer Sister   . Crohn's disease Niece   . Cancer Nephew        gallbladder  . Stomach cancer Neg Hx   . Rectal cancer Neg Hx   . Esophageal cancer Neg Hx   . Colon cancer Neg Hx     Social History   Socioeconomic History  . Marital status: Divorced    Spouse name: Not on file  . Number of children: 3  . Years of education: Not on file  . Highest education level: Not on file  Occupational History  . 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 Smoking status: Never Smoker  . Smokeless  tobacco: Never Used  Vaping Use  . Vaping Use: Never used  Substance and Sexual Activity  . Alcohol use: No  . Drug use: No  . Sexual activity: Not Currently  Other Topics Concern  . Not on file  Social History Narrative   Lives with daughter and grandkids.   Social Determinants of Health   Financial Resource Strain:   . Difficulty of Paying Living Expenses: Not on file  Food Insecurity:   . Worried About Charity fundraiser in the Last Year: Not on file  . Ran Out of Food in the Last Year: Not on file  Transportation Needs:   . Lack of Transportation (Medical): Not on file  . Lack of Transportation (Non-Medical): Not on file  Physical Activity:   . Days of Exercise per Week: Not on file  . Minutes of Exercise per Session: Not on file  Stress:   . Feeling of Stress : Not on file  Social Connections:   . Frequency of Communication with Friends and Family: Not on file  . Frequency of Social Gatherings with Friends and  Family: Not on file  . Attends Religious Services: Not on file  . Active Member of Clubs or Organizations: Not on file  . Attends Archivist Meetings: Not on file  . Marital Status: Not on file  Intimate Partner Violence:   . Fear of Current or Ex-Partner: Not on file  . Emotionally Abused: Not on file  . Physically Abused: Not on file  . Sexually Abused: Not on file    Outpatient Medications Prior to Visit  Medication Sig Dispense Refill  . albuterol (VENTOLIN HFA) 108 (90 Base) MCG/ACT inhaler Inhale 2 puffs into the lungs every 6 (six) hours as needed for wheezing or shortness of breath. (Patient not taking: Reported on 03/23/2020) 8 g 0  . amoxicillin-clavulanate (AUGMENTIN) 875-125 MG tablet Take 1 tablet by mouth 2 (two) times daily. 14 tablet 0  . Calcium Carb-Cholecalciferol (CALCIUM+D3 PO) Take 1 tablet by mouth daily.    . Cyanocobalamin (VITAMIN B-12 PO) Take by mouth.    . esomeprazole (NEXIUM) 40 MG capsule Take 1 capsule by mouth twice daily 60 capsule 2  . Ferrous Sulfate (IRON PO) Take by mouth.    Marland Kitchen glucose blood test strip Use as instructed 100 each 3  . guaiFENesin-dextromethorphan (ROBITUSSIN DM) 100-10 MG/5ML syrup Take 5 mLs by mouth every 4 (four) hours as needed for cough. 118 mL 0  . hydrochlorothiazide (HYDRODIURIL) 25 MG tablet Take 1 tablet by mouth once daily 90 tablet 1  . loperamide (IMODIUM A-D) 2 MG tablet Take 4 mg by mouth 4 (four) times daily as needed for diarrhea or loose stools.     . metFORMIN (GLUCOPHAGE) 500 MG tablet Take 1 tablet by mouth in the morning, at noon, and at bedtime.    . naproxen sodium (ALEVE) 220 MG tablet Take 220 mg by mouth.    . ondansetron (ZOFRAN ODT) 4 MG disintegrating tablet Take 1 tablet (4 mg total) by mouth every 8 (eight) hours as needed for nausea or vomiting. 20 tablet 0  . ondansetron (ZOFRAN) 8 MG tablet Take 1 tablet (8 mg total) by mouth every 8 (eight) hours as needed for nausea or vomiting. 20 tablet  0  . ondansetron (ZOFRAN-ODT) 4 MG disintegrating tablet Take 4 mg by mouth every 8 (eight) hours as needed for nausea or vomiting.    Marland Kitchen Phenazopyridine HCl (AZO URINARY PAIN PO) Take 2  tablets by mouth daily as needed (urinary discomfort).     . polyethylene glycol (MIRALAX / GLYCOLAX) packet Take 17 g by mouth daily as needed for mild constipation.     . Potassium 99 MG TABS Take 99-198 mg by mouth See admin instructions. Take 198 mg in the morning and 99 mg at night    . pravastatin (PRAVACHOL) 40 MG tablet Take 1 tablet (40 mg total) by mouth at bedtime. 90 tablet 3  . predniSONE (STERAPRED UNI-PAK 21 TAB) 10 MG (21) TBPK tablet Take per package instructions. Do not skip doses. Finish entire supply. (Patient not taking: Reported on 03/23/2020) 1 each 0   No facility-administered medications prior to visit.    Allergies  Allergen Reactions  . Sulfa Antibiotics Hives and Other (See Comments)    Heart Rate goes up  . Morphine And Related Other (See Comments)    Headache and hallucinations  . Septra [Sulfamethoxazole-Trimethoprim] Other (See Comments)    TACHYCARDIA    Review of Systems  Constitutional: Negative.   HENT: Negative.   Eyes: Negative.   Respiratory: Positive for cough, shortness of breath and wheezing.   Cardiovascular: Negative.   Gastrointestinal: Negative.   Genitourinary: Negative.   Musculoskeletal: Negative.   Skin: Negative.   Neurological: Negative.   Psychiatric/Behavioral: Negative.        Objective:    Physical Exam Vitals and nursing note reviewed.  Constitutional:      General: She is not in acute distress.    Appearance: Normal appearance. She is not ill-appearing, toxic-appearing or diaphoretic.  Cardiovascular:     Rate and Rhythm: Normal rate and regular rhythm.  Pulmonary:     Effort: Pulmonary effort is normal. No respiratory distress.     Breath sounds: No wheezing.  Skin:    General: Skin is warm and dry.  Neurological:      General: No focal deficit present.     Mental Status: She is alert and oriented to person, place, and time. Mental status is at baseline.  Psychiatric:        Mood and Affect: Mood normal.        Behavior: Behavior normal.        Thought Content: Thought content normal.        Judgment: Judgment normal.     BP 136/85   Pulse 90   Temp 98.7 F (37.1 C) (Temporal)   Resp 18   Ht 5\' 7"  (1.702 m)   Wt 205 lb (93 kg)   LMP 08/10/2012 Comment: ovaries remain//a.c.  SpO2 97%   BMI 32.11 kg/m  Wt Readings from Last 3 Encounters:  03/23/20 205 lb (93 kg)  03/04/20 199 lb 6.4 oz (90.4 kg)  02/09/20 211 lb 6.4 oz (95.9 kg)    There are no preventive care reminders to display for this patient.  There are no preventive care reminders to display for this patient.   Lab Results  Component Value Date   TSH 0.887 03/04/2020   Lab Results  Component Value Date   WBC 8.6 03/04/2020   HGB 13.6 03/04/2020   HCT 44.7 03/04/2020   MCV 77 (L) 03/04/2020   PLT 260.0 01/20/2020   Lab Results  Component Value Date   NA 145 (H) 03/04/2020   K 4.4 03/04/2020   CO2 21 03/04/2020   GLUCOSE 125 (H) 03/04/2020   BUN 11 03/04/2020   CREATININE 0.91 03/04/2020   BILITOT <0.2 03/04/2020   ALKPHOS 98 03/04/2020  AST 30 03/04/2020   ALT 33 (H) 03/04/2020   PROT 7.3 03/04/2020   ALBUMIN 4.4 03/04/2020   CALCIUM 9.5 03/04/2020   ANIONGAP 10 11/11/2018   Lab Results  Component Value Date   CHOL 190 08/31/2019   Lab Results  Component Value Date   HDL 42 08/31/2019   Lab Results  Component Value Date   LDLCALC 96 08/31/2019   Lab Results  Component Value Date   TRIG 307 (H) 08/31/2019   Lab Results  Component Value Date   CHOLHDL 4.5 (H) 08/31/2019   Lab Results  Component Value Date   HGBA1C 7.2 (A) 11/24/2019       Assessment & Plan:   Problem List Items Addressed This Visit    None    Visit Diagnoses    Frequent urination    -  Primary   Relevant Orders   POCT  URINALYSIS DIP (CLINITEK) (Completed)   Productive cough       Relevant Medications   beclomethasone (QVAR REDIHALER) 40 MCG/ACT inhaler   montelukast (SINGULAIR) 10 MG tablet   Other Relevant Orders   DG Chest 2 View (Completed)   Wheezing       Relevant Medications   beclomethasone (QVAR REDIHALER) 40 MCG/ACT inhaler   montelukast (SINGULAIR) 10 MG tablet   Allergic cough       Relevant Medications   beclomethasone (QVAR REDIHALER) 40 MCG/ACT inhaler   montelukast (SINGULAIR) 10 MG tablet       Meds ordered this encounter  Medications  . beclomethasone (QVAR REDIHALER) 40 MCG/ACT inhaler    Sig: Inhale 1 puff into the lungs 2 (two) times daily.    Dispense:  2 each    Refill:  2    Order Specific Question:   Supervising Provider    Answer:   Carlota Raspberry, JEFFREY R [[id number]]  . montelukast (SINGULAIR) 10 MG tablet    Sig: Take 1 tablet (10 mg total) by mouth at bedtime.    Dispense:  30 tablet    Refill:  3    Order Specific Question:   Supervising Provider    Answer:   Carlota Raspberry, JEFFREY R [[id number]]   PLAN  Dg chest shows no ongoing pna, no infiltrates.  Suspect allergies / reactive airway  Start qvar 1 puff twice daily and montelukast 10mg  PO qhs  Return if symptoms worsen or fail to improve  Patient encouraged to call clinic with any questions, comments, or concerns.  Maximiano Coss, NP

## 2020-03-29 NOTE — Telephone Encounter (Signed)
Per Jerene Pitch and Orland Mustard form has been faxed and sent to scan.

## 2020-04-13 ENCOUNTER — Other Ambulatory Visit: Payer: Self-pay | Admitting: Orthopaedic Surgery

## 2020-04-13 ENCOUNTER — Encounter: Payer: Self-pay | Admitting: Orthopaedic Surgery

## 2020-04-13 MED ORDER — PREDNISONE 10 MG (21) PO TBPK: ORAL_TABLET | ORAL | 0 refills | Status: DC

## 2020-04-14 ENCOUNTER — Other Ambulatory Visit: Payer: Self-pay | Admitting: Orthopaedic Surgery

## 2020-04-14 DIAGNOSIS — J1282 Pneumonia due to coronavirus disease 2019: Secondary | ICD-10-CM

## 2020-04-14 DIAGNOSIS — U071 COVID-19: Secondary | ICD-10-CM

## 2020-04-14 MED ORDER — PREDNISONE 10 MG (21) PO TBPK: ORAL_TABLET | ORAL | 0 refills | Status: DC

## 2020-04-18 ENCOUNTER — Encounter: Payer: Self-pay | Admitting: Orthopaedic Surgery

## 2020-04-19 ENCOUNTER — Other Ambulatory Visit: Payer: Self-pay | Admitting: Orthopaedic Surgery

## 2020-04-19 DIAGNOSIS — E119 Type 2 diabetes mellitus without complications: Secondary | ICD-10-CM | POA: Diagnosis not present

## 2020-04-19 MED ORDER — DICLOFENAC SODIUM 75 MG PO TBEC: 75.0000 mg | DELAYED_RELEASE_TABLET | Freq: Two times a day (BID) | ORAL | 2 refills | Status: DC
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
## 2020-05-08 ENCOUNTER — Encounter: Payer: Self-pay | Admitting: Registered Nurse

## 2020-05-08 NOTE — Progress Notes (Signed)
Established Patient Office Visit  Subjective:  Patient ID: Angelica Sharp, female    DOB: 04/29/1966  Age: 54 y.o. MRN: [id number]  CC:  Chief Complaint  Patient presents with  . Follow-up    COVID    HPI Angelica Sharp presents for still having some symptoms, but overall much improved No new concerns Still having some cough now with some production Otherwise symptoms near resolved.   Past Medical History:  Diagnosis Date  . Allergy   . Anemia   . Anxiety   . Arthritis   . Borderline hypertension   . Chest pain   . Complication of anesthesia   . Degenerative lumbar disc    L5S1  . Diabetes mellitus without complication ([place])   . Diverticulitis   . Dyspnea   . Edema    lower extremity  . Family history of anesthesia complication    sister had BP complications during anes.  . Fatty liver   . GERD (gastroesophageal reflux disease)   . H/O hiatal hernia   . Headache   . HLD (hyperlipidemia)   . Hypertension   . Pneumonia   . PONV (postoperative nausea and vomiting)   . SOB (shortness of breath)     Past Surgical History:  Procedure Laterality Date  . BREAST LUMPECTOMY     4 lumps -- two from right -- one from left  . LAPAROSCOPIC ASSISTED VAGINAL HYSTERECTOMY N/A 09/18/2012   Procedure: LAPAROSCOPIC ASSISTED VAGINAL HYSTERECTOMY;  Surgeon: Linda Hedges, DO;  Location: [facility];  Service: Gynecology;  Laterality: N/A;  BLADDER  DISTENTION  . TOTAL HIP ARTHROPLASTY Left 11/10/2018   Procedure: LEFT TOTAL HIP ARTHROPLASTY ANTERIOR APPROACH;  Surgeon: Leandrew Koyanagi, MD;  Location: [facility];  Service: Orthopedics;  Laterality: Left;  . TUBAL LIGATION      Family History  Problem Relation Age of Onset  . Heart attack Mother   . Diabetes Mother   . Hyperlipidemia Mother   . Hypertension Mother   . Stroke Sister   . Pulmonary embolism Father   . Clotting disorder Father   . Diabetes Sister   . Kidney disease Sister        kidney failure  . Heart disease Sister   .  Cancer Sister        abdominal  . Diabetes Son 30       TYPE 1  . Kidney cancer Sister   . Crohn's disease Niece   . Cancer Nephew        gallbladder  . Stomach cancer Neg Hx   . Rectal cancer Neg Hx   . Esophageal cancer Neg Hx   . Colon cancer Neg Hx     Social History   Socioeconomic History  . Marital status: Divorced    Spouse name: Not on file  . Number of children: 3  . Years of education: Not on file  . Highest education level: Not on file  Occupational History  . 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 Smoking status: Never Smoker  . Smokeless tobacco: Never Used  Vaping Use  . Vaping Use: Never used  Substance and Sexual Activity  . Alcohol use: No  . Drug use: No  . Sexual activity: Not Currently  Other Topics Concern  . Not on file  Social History Narrative   Lives with daughter and grandkids.   Social  Determinants of Health   Financial Resource Strain:   . Difficulty of Paying Living Expenses: Not on file  Food Insecurity:   . Worried About Charity fundraiser in the Last Year: Not on file  . Ran Out of Food in the Last Year: Not on file  Transportation Needs:   . Lack of Transportation (Medical): Not on file  . Lack of Transportation (Non-Medical): Not on file  Physical Activity:   . Days of Exercise per Week: Not on file  . Minutes of Exercise per Session: Not on file  Stress:   . Feeling of Stress : Not on file  Social Connections:   . Frequency of Communication with Friends and Family: Not on file  . Frequency of Social Gatherings with Friends and Family: Not on file  . Attends Religious Services: Not on file  . Active Member of Clubs or Organizations: Not on file  . Attends Archivist Meetings: Not on file  . Marital Status: Not on file  Intimate Partner Violence:   . Fear of Current or Ex-Partner: Not on file  . Emotionally Abused: Not on file   . Physically Abused: Not on file  . Sexually Abused: Not on file    Outpatient Medications Prior to Visit  Medication Sig Dispense Refill  . albuterol (VENTOLIN HFA) 108 (90 Base) MCG/ACT inhaler Inhale 2 puffs into the lungs every 6 (six) hours as needed for wheezing or shortness of breath. (Patient not taking: Reported on 03/23/2020) 8 g 0  . Calcium Carb-Cholecalciferol (CALCIUM+D3 PO) Take 1 tablet by mouth daily.    . Cyanocobalamin (VITAMIN B-12 PO) Take by mouth.    . esomeprazole (NEXIUM) 40 MG capsule Take 1 capsule by mouth twice daily 60 capsule 2  . Ferrous Sulfate (IRON PO) Take by mouth.    Marland Kitchen glucose blood test strip Use as instructed 100 each 3  . guaiFENesin-dextromethorphan (ROBITUSSIN DM) 100-10 MG/5ML syrup Take 5 mLs by mouth every 4 (four) hours as needed for cough. 118 mL 0  . hydrochlorothiazide (HYDRODIURIL) 25 MG tablet Take 1 tablet by mouth once daily 90 tablet 1  . loperamide (IMODIUM A-D) 2 MG tablet Take 4 mg by mouth 4 (four) times daily as needed for diarrhea or loose stools.     . metFORMIN (GLUCOPHAGE) 500 MG tablet Take 1 tablet by mouth in the morning, at noon, and at bedtime.    . naproxen sodium (ALEVE) 220 MG tablet Take 220 mg by mouth.    . ondansetron (ZOFRAN ODT) 4 MG disintegrating tablet Take 1 tablet (4 mg total) by mouth every 8 (eight) hours as needed for nausea or vomiting. 20 tablet 0  . ondansetron (ZOFRAN) 8 MG tablet Take 1 tablet (8 mg total) by mouth every 8 (eight) hours as needed for nausea or vomiting. 20 tablet 0  . ondansetron (ZOFRAN-ODT) 4 MG disintegrating tablet Take 4 mg by mouth every 8 (eight) hours as needed for nausea or vomiting.    Marland Kitchen Phenazopyridine HCl (AZO URINARY PAIN PO) Take 2 tablets by mouth daily as needed (urinary discomfort).     . polyethylene glycol (MIRALAX / GLYCOLAX) packet Take 17 g by mouth daily as needed for mild constipation.     . Potassium 99 MG TABS Take 99-198 mg by mouth See admin instructions.  Take 198 mg in the morning and 99 mg at night    . pravastatin (PRAVACHOL) 40 MG tablet Take 1 tablet (40 mg total) by mouth  at bedtime. 90 tablet 3   No facility-administered medications prior to visit.    Allergies  Allergen Reactions  . Sulfa Antibiotics Hives and Other (See Comments)    Heart Rate goes up  . Morphine And Related Other (See Comments)    Headache and hallucinations  . Septra [Sulfamethoxazole-Trimethoprim] Other (See Comments)    TACHYCARDIA    ROS Review of Systems  Constitutional: Negative.   HENT: Negative.   Eyes: Negative.   Respiratory: Negative.   Cardiovascular: Negative.   Gastrointestinal: Negative.   Genitourinary: Negative.   Musculoskeletal: Negative.   Skin: Negative.   Neurological: Negative.   Psychiatric/Behavioral: Negative.   All other systems reviewed and are negative.     Objective:    Physical Exam Vitals and nursing note reviewed.  Constitutional:      General: She is not in acute distress.    Appearance: Normal appearance. She is normal weight. She is not ill-appearing, toxic-appearing or diaphoretic.  Cardiovascular:     Rate and Rhythm: Normal rate and regular rhythm.     Heart sounds: Normal heart sounds. No murmur heard.  No friction rub. No gallop.   Pulmonary:     Effort: Pulmonary effort is normal. No respiratory distress.     Breath sounds: No stridor. Wheezing (mild throughout) present. No rhonchi or rales.  Chest:     Chest wall: No tenderness.  Skin:    General: Skin is warm and dry.  Neurological:     General: No focal deficit present.     Mental Status: She is alert and oriented to person, place, and time. Mental status is at baseline.  Psychiatric:        Mood and Affect: Mood normal.        Behavior: Behavior normal.        Thought Content: Thought content normal.        Judgment: Judgment normal.     BP 122/78 (BP Location: Right Arm, Patient Position: Sitting, Cuff Size: Normal)   Pulse 86    Temp 97.6 F (36.4 C) (Temporal)   Ht 5\' 7"  (1.702 m)   Wt 199 lb 6.4 oz (90.4 kg)   LMP 08/10/2012 Comment: ovaries remain//a.c.  SpO2 94%   BMI 31.23 kg/m  Wt Readings from Last 3 Encounters:  03/23/20 205 lb (93 kg)  03/04/20 199 lb 6.4 oz (90.4 kg)  02/09/20 211 lb 6.4 oz (95.9 kg)     Health Maintenance Due  Topic Date Due  . COVID-19 Vaccine (1) Never done  . FOOT EXAM  08/29/2019    There are no preventive care reminders to display for this patient.  Lab Results  Component Value Date   TSH 0.887 03/04/2020   Lab Results  Component Value Date   WBC 8.6 03/04/2020   HGB 13.6 03/04/2020   HCT 44.7 03/04/2020   MCV 77 (L) 03/04/2020   PLT 260.0 01/20/2020   Lab Results  Component Value Date   NA 145 (H) 03/04/2020   K 4.4 03/04/2020   CO2 21 03/04/2020   GLUCOSE 125 (H) 03/04/2020   BUN 11 03/04/2020   CREATININE 0.91 03/04/2020   BILITOT <0.2 03/04/2020   ALKPHOS 98 03/04/2020   AST 30 03/04/2020   ALT 33 (H) 03/04/2020   PROT 7.3 03/04/2020   ALBUMIN 4.4 03/04/2020   CALCIUM 9.5 03/04/2020   ANIONGAP 10 11/11/2018   Lab Results  Component Value Date   CHOL 190 08/31/2019   Lab Results  Component Value Date   HDL 42 08/31/2019   Lab Results  Component Value Date   LDLCALC 96 08/31/2019   Lab Results  Component Value Date   TRIG 307 (H) 08/31/2019   Lab Results  Component Value Date   CHOLHDL 4.5 (H) 08/31/2019   Lab Results  Component Value Date   HGBA1C 7.2 (A) 11/24/2019      Assessment & Plan:   Problem List Items Addressed This Visit    None    Visit Diagnoses    Shortness of breath    -  Primary   Relevant Orders   DG Chest 2 View (Completed)   CBC With Differential (Completed)   Iron, TIBC and Ferritin Panel (Completed)   Comprehensive metabolic panel (Completed)   TSH (Completed)   COVID-19       Relevant Orders   DG Chest 2 View (Completed)   CBC With Differential (Completed)   Iron, TIBC and Ferritin Panel  (Completed)   Comprehensive metabolic panel (Completed)   TSH (Completed)   Other fatigue       Relevant Orders   CBC With Differential (Completed)   Iron, TIBC and Ferritin Panel (Completed)   Comprehensive metabolic panel (Completed)   TSH (Completed)   Pneumonia due to COVID-19 virus       Relevant Medications   amoxicillin-clavulanate (AUGMENTIN) 875-125 MG tablet      Meds ordered this encounter  Medications  . amoxicillin-clavulanate (AUGMENTIN) 875-125 MG tablet    Sig: Take 1 tablet by mouth 2 (two) times daily.    Dispense:  14 tablet    Refill:  0    Order Specific Question:   Supervising Provider    Answer:   Carlota Raspberry, JEFFREY R [[id number]]  . DISCONTD: predniSONE (STERAPRED UNI-PAK 21 TAB) 10 MG (21) TBPK tablet    Sig: Take per package instructions. Do not skip doses. Finish entire supply.    Dispense:  1 each    Refill:  0    Order Specific Question:   Supervising Provider    Answer:   Carlota Raspberry, JEFFREY R [[id number]]    Follow-up: No follow-ups on file.   PLAN  cxr showing low lung volume and bibasalar atelectasis  Will draw labs including follow up on anemia  augmentin 7 day course and prednisone taper for symptoms  Return prn  Consider referral to pulmonology if symptoms persist  Patient encouraged to call clinic with any questions, comments, or concerns.   Maximiano Coss, NP

## 2020-05-19 ENCOUNTER — Other Ambulatory Visit: Payer: Self-pay | Admitting: Internal Medicine

## 2020-06-08 ENCOUNTER — Other Ambulatory Visit: Payer: Self-pay | Admitting: Orthopaedic Surgery

## 2020-06-09 ENCOUNTER — Encounter: Payer: Self-pay | Admitting: Orthopaedic Surgery

## 2020-06-09 MED ORDER — OMEPRAZOLE 40 MG PO CPDR: 40.0000 mg | DELAYED_RELEASE_CAPSULE | Freq: Every day | ORAL | 0 refills | Status: DC

## 2020-06-13 ENCOUNTER — Other Ambulatory Visit: Payer: Self-pay | Admitting: Physician Assistant

## 2020-06-13 MED ORDER — DICLOFENAC SODIUM 75 MG PO TBEC
75.0000 mg | DELAYED_RELEASE_TABLET | Freq: Two times a day (BID) | ORAL | 0 refills | Status: DC | PRN
Start: 2020-06-13 — End: 2020-10-11

## 2020-06-13 MED ORDER — OMEPRAZOLE 40 MG PO CPDR: 40.0000 mg | DELAYED_RELEASE_CAPSULE | Freq: Two times a day (BID) | ORAL | 0 refills | Status: DC

## 2020-06-13 NOTE — Telephone Encounter (Signed)
Sent in 3 months

## 2020-06-13 NOTE — Addendum Note (Signed)
Addended by: Richardson Chiquito on: 06/13/2020 09:53 AM   Modules accepted: Orders

## 2020-06-26 ENCOUNTER — Telehealth: Payer: BC Managed Care – PPO | Admitting: Family

## 2020-06-26 DIAGNOSIS — R059 Cough, unspecified: Secondary | ICD-10-CM | POA: Diagnosis not present

## 2020-06-26 MED ORDER — PREDNISONE 10 MG (21) PO TBPK: ORAL_TABLET | ORAL | 0 refills | Status: DC

## 2020-06-26 MED ORDER — AZITHROMYCIN 250 MG PO TABS: ORAL_TABLET | ORAL | 0 refills | Status: DC

## 2020-06-26 MED ORDER — BENZONATATE 100 MG PO CAPS: 100.0000 mg | ORAL_CAPSULE | Freq: Three times a day (TID) | ORAL | 0 refills | Status: DC | PRN

## 2020-06-26 NOTE — Progress Notes (Signed)
We are sorry that you are not feeling well.  Here is how we plan to help!  Based on your presentation I believe you most likely have A cough due to bacteria.  When patients have a fever and a productive cough with a change in color or increased sputum production, we are concerned about bacterial bronchitis.  If left untreated it can progress to pneumonia.  If your symptoms do not improve with your treatment plan it is important that you contact your provider.   I have prescribed Azithromyin 250 mg: two tablets now and then one tablet daily for 4 additonal days    In addition you may use A non-prescription cough medication called Robitussin DAC. Take 2 teaspoons every 8 hours or Delsym: take 2 teaspoons every 12 hours., A non-prescription cough medication called Mucinex DM: take 2 tablets every 12 hours. and A prescription cough medication called Tessalon Perles 100mg. You may take 1-2 capsules every 8 hours as needed for your cough.  Prednisone 10 mg daily for 6 days (see taper instructions below)  Directions for 6 day taper: Day 1: 2 tablets before breakfast, 1 after both lunch & dinner and 2 at bedtime Day 2: 1 tab before breakfast, 1 after both lunch & dinner and 2 at bedtime Day 3: 1 tab at each meal & 1 at bedtime Day 4: 1 tab at breakfast, 1 at lunch, 1 at bedtime Day 5: 1 tab at breakfast & 1 tab at bedtime Day 6: 1 tab at breakfast   From your responses in the eVisit questionnaire you describe inflammation in the upper respiratory tract which is causing a significant cough.  This is commonly called Bronchitis and has four common causes:    Allergies  Viral Infections  Acid Reflux  Bacterial Infection Allergies, viruses and acid reflux are treated by controlling symptoms or eliminating the cause. An example might be a cough caused by taking certain blood pressure medications. You stop the cough by changing the medication. Another example might be a cough caused by acid reflux.  Controlling the reflux helps control the cough.  USE OF BRONCHODILATOR ("RESCUE") INHALERS: There is a risk from using your bronchodilator too frequently.  The risk is that over-reliance on a medication which only relaxes the muscles surrounding the breathing tubes can reduce the effectiveness of medications prescribed to reduce swelling and congestion of the tubes themselves.  Although you feel brief relief from the bronchodilator inhaler, your asthma may actually be worsening with the tubes becoming more swollen and filled with mucus.  This can delay other crucial treatments, such as oral steroid medications. If you need to use a bronchodilator inhaler daily, several times per day, you should discuss this with your provider.  There are probably better treatments that could be used to keep your asthma under control.     HOME CARE . Only take medications as instructed by your medical team. . Complete the entire course of an antibiotic. . Drink plenty of fluids and get plenty of rest. . Avoid close contacts especially the very young and the elderly . Cover your mouth if you cough or cough into your sleeve. . Always remember to wash your hands . A steam or ultrasonic humidifier can help congestion.   GET HELP RIGHT AWAY IF: . You develop worsening fever. . You become short of breath . You cough up blood. . Your symptoms persist after you have completed your treatment plan MAKE SURE YOU   Understand these instructions.    Will watch your condition.  Will get help right away if you are not doing well or get worse.  Your e-visit answers were reviewed by a board certified advanced clinical practitioner to complete your personal care plan.  Depending on the condition, your plan could have included both over the counter or prescription medications. If there is a problem please reply  once you have received a response from your provider. Your safety is important to us.  If you have drug allergies  check your prescription carefully.    You can use MyChart to ask questions about today's visit, request a non-urgent call back, or ask for a work or school excuse for 24 hours related to this e-Visit. If it has been greater than 24 hours you will need to follow up with your provider, or enter a new e-Visit to address those concerns. You will get an e-mail in the next two days asking about your experience.  I hope that your e-visit has been valuable and will speed your recovery. Thank you for using e-visits.  Approximately 5 minutes was spent documenting and reviewing patient's chart.    

## 2020-07-11 ENCOUNTER — Encounter: Payer: Self-pay | Admitting: Orthopaedic Surgery

## 2020-07-13 ENCOUNTER — Ambulatory Visit: Payer: Self-pay

## 2020-07-13 ENCOUNTER — Other Ambulatory Visit: Payer: Self-pay

## 2020-07-13 ENCOUNTER — Ambulatory Visit: Payer: BC Managed Care – PPO | Admitting: Orthopaedic Surgery

## 2020-07-13 DIAGNOSIS — M545 Low back pain, unspecified: Secondary | ICD-10-CM | POA: Diagnosis not present

## 2020-07-13 DIAGNOSIS — M25552 Pain in left hip: Secondary | ICD-10-CM | POA: Diagnosis not present

## 2020-07-13 DIAGNOSIS — M25572 Pain in left ankle and joints of left foot: Secondary | ICD-10-CM

## 2020-07-13 MED ORDER — PREDNISONE 10 MG (21) PO TBPK: ORAL_TABLET | ORAL | 0 refills | Status: DC

## 2020-07-13 MED ORDER — METHOCARBAMOL 500 MG PO TABS: 500.0000 mg | ORAL_TABLET | Freq: Two times a day (BID) | ORAL | 0 refills | Status: DC | PRN

## 2020-07-13 NOTE — Progress Notes (Signed)
Office Visit Note   Patient: Angelica Sharp           Date of Birth: 1966/01/29           MRN: [id number] Visit Date: 07/13/2020              Requested by: Wendie Agreste, MD [address] [place],  [place] [address] PCP: Wendie Agreste, MD   Assessment & Plan: Visit Diagnoses:  1. Pain in left hip   2. Low back pain, unspecified back pain laterality, unspecified chronicity, unspecified whether sciatica present   3. Pain in left ankle and joints of left foot     Plan: Impression is exacerbation of left lower back pain from recent fall.  We have discussed treating this with steroids and muscle relaxers.  I do not believe she injured her hip in any way.  In regards to her ankle, we will provide her with an ASO brace.  She will ice and elevate as needed for pain and swelling.  She will follow up with [place] as needed.  Follow-Up Instructions: Return if symptoms worsen or fail to improve.   Orders:  Orders Placed This Encounter  Procedures  . XR HIP UNILAT W OR W/O PELVIS 2-3 VIEWS LEFT  . XR Lumbar Spine 2-3 Views  . XR Ankle Complete Left   Meds ordered this encounter  Medications  . predniSONE (STERAPRED UNI-PAK 21 TAB) 10 MG (21) TBPK tablet    Sig: Take as directed    Dispense:  21 tablet    Refill:  0      Procedures: No procedures performed   Clinical Data: No additional findings.   Subjective: Chief Complaint  Patient presents with  . Left Hip - Pain  . Lower Back - Pain  . Left Ankle - Pain    HPI patient is a pleasant 55 year old female who comes in today with pain to the left lower back, left hip and left ankle.  This all began after falling backwards while standing on her bathroom vanity painting the walls.  The space was somewhat small so she fell hitting the walls and in a somewhat crumpling position.  She has had occasional pain to the left lower back, left groin and left anterior thigh since.  These are very intermittent and are most aggravating  with walking a lot.  She is also complaining of left ankle pain following a fall causing an inversion injury to the left ankle back in December.  The pain there has improved quite a bit but she still has slight discomfort in lingering swelling.  Review of Systems as detailed in HPI.  All others reviewed and are negative.   Objective: Vital Signs: LMP 08/10/2012 Comment: ovaries remain//a.c.  Physical Exam well-developed and well-nourished female no acute distress.  Alert and oriented x3.  Ortho Exam left hip exam reveals a negative logroll negative FADIR.  Lumbar exam shows no spinous tenderness.  She has mild to moderate tenderness to the lower left lumbar paraspinous musculature.  No focal weakness.  Left ankle shows mild swelling.  Mild tenderness to the distal fibula.  She has slight increased pain with eversion.  She is neurovascular intact distally.  Specialty Comments:  No specialty comments available.  Imaging: XR Ankle Complete Left  Result Date: 07/13/2020 No acute or structural abnormalities  XR HIP UNILAT W OR W/O PELVIS 2-3 VIEWS LEFT  Result Date: 07/13/2020 Well-seated prosthesis without complication  XR Lumbar Spine 2-3 Views  Result  Date: 07/13/2020 Disc space narrowing L5-S1.  No other acute findings    PMFS History: Patient Active Problem List   Diagnosis Date Noted  . Family history of heart disease 12/09/2019  . Palpitations 12/09/2019  . Sliding hiatal hernia 12/02/2019  . Status post total replacement of left hip 11/10/2018  . Primary osteoarthritis of left hip 08/22/2018  . Essential hypertension 11/25/2017  . Foot pain 11/23/2017  . Chronic joint pain 03/29/2015  . DM II (diabetes mellitus, type II), controlled ([facility] [facility]) 10/01/2014  . Obesity 03/12/2014  . 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 Hyperlipidemia 09/10/2013  . Family history of cancer 08/30/2012  . GERD (gastroesophageal reflux disease) 07/03/2011  . Dyspnea on exertion 01/24/2010  .  Atypical chest pain 01/24/2010   Past Medical History:  Diagnosis Date  . Allergy   . Anemia   . Anxiety   . Arthritis   . Borderline hypertension   . Chest pain   . Complication of anesthesia   . Degenerative lumbar disc    L5S1  . Diabetes mellitus without complication ([place])   . Diverticulitis   . Dyspnea   . Edema    lower extremity  . Family history of anesthesia complication    sister had BP complications during anes.  . Fatty liver   . GERD (gastroesophageal reflux disease)   . H/O hiatal hernia   . Headache   . HLD (hyperlipidemia)   . Hypertension   . Pneumonia   . PONV (postoperative nausea and vomiting)   . SOB (shortness of breath)     Family History  Problem Relation Age of Onset  . Heart attack Mother   . Diabetes Mother   . Hyperlipidemia Mother   . Hypertension Mother   . Stroke Sister   . Pulmonary embolism Father   . Clotting disorder Father   . Diabetes Sister   . Kidney disease Sister        kidney failure  . Heart disease Sister   . Cancer Sister        abdominal  . Diabetes Son 36       TYPE 1  . Kidney cancer Sister   . Crohn's disease Niece   . Cancer Nephew        gallbladder  . Stomach cancer Neg Hx   . Rectal cancer Neg Hx   . Esophageal cancer Neg Hx   . Colon cancer Neg Hx     Past Surgical History:  Procedure Laterality Date  . BREAST LUMPECTOMY     4 lumps -- two from right -- one from left  . LAPAROSCOPIC ASSISTED VAGINAL HYSTERECTOMY N/A 09/18/2012   Procedure: LAPAROSCOPIC ASSISTED VAGINAL HYSTERECTOMY;  Surgeon: Linda Hedges, DO;  Location: [facility];  Service: Gynecology;  Laterality: N/A;  BLADDER  DISTENTION  . TOTAL HIP ARTHROPLASTY Left 11/10/2018   Procedure: LEFT TOTAL HIP ARTHROPLASTY ANTERIOR APPROACH;  Surgeon: Leandrew Koyanagi, MD;  Location: [facility];  Service: Orthopedics;  Laterality: Left;  . TUBAL LIGATION     Social History   Occupational History  . 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 Smoking status: Never Smoker  . Smokeless tobacco: Never Used  Vaping Use  . Vaping Use: Never used  Substance and Sexual Activity  . Alcohol use: No  . Drug use: No  . Sexual activity: Not Currently

## 2020-07-25 ENCOUNTER — Other Ambulatory Visit: Payer: Self-pay | Admitting: Family Medicine

## 2020-07-25 DIAGNOSIS — I10 Essential (primary) hypertension: Secondary | ICD-10-CM

## 2020-07-25 DIAGNOSIS — R609 Edema, unspecified: Secondary | ICD-10-CM

## 2020-07-25 NOTE — Telephone Encounter (Signed)
Requested Prescriptions  Pending Prescriptions Disp Refills  . hydrochlorothiazide (HYDRODIURIL) 25 MG tablet [Pharmacy Med Name: hydroCHLOROthiazide 25 MG Oral Tablet] 90 tablet 0    Sig: Take 1 tablet by mouth once daily     Cardiovascular: Diuretics - Thiazide Failed - 07/25/2020  2:01 PM      Failed - Na in normal range and within 360 days    Sodium  Date Value Ref Range Status  03/04/2020 145 (H) 134 - 144 mmol/L Final         Passed - Ca in normal range and within 360 days    Calcium  Date Value Ref Range Status  03/04/2020 9.5 8.7 - 10.2 mg/dL Final         Passed - Cr in normal range and within 360 days    Creat  Date Value Ref Range Status  05/10/2015 1.01 0.50 - 1.05 mg/dL Final   Creatinine, Ser  Date Value Ref Range Status  03/04/2020 0.91 0.57 - 1.00 mg/dL Final         Passed - K in normal range and within 360 days    Potassium  Date Value Ref Range Status  03/04/2020 4.4 3.5 - 5.2 mmol/L Final         Passed - Last BP in normal range    BP Readings from Last 1 Encounters:  03/23/20 136/85         Passed - Valid encounter within last 6 months    Recent Outpatient Visits          4 months ago Frequent urination   [facility], Delfino Lovett, NP   4 months ago Shortness of breath   [facility], Delfino Lovett, NP   5 months ago Shortness of breath   [facility], Delfino Lovett, NP   8 months ago Palpitations   [facility], Delfino Lovett, NP   8 months ago Lower abdominal pain   [facility], Delfino Lovett, NP

## 2020-08-24 ENCOUNTER — Telehealth: Payer: BC Managed Care – PPO | Admitting: Family

## 2020-08-24 DIAGNOSIS — J069 Acute upper respiratory infection, unspecified: Secondary | ICD-10-CM | POA: Diagnosis not present

## 2020-08-24 MED ORDER — FLUTICASONE PROPIONATE 50 MCG/ACT NA SUSP: 2.0000 | Freq: Every day | NASAL | 6 refills | Status: DC

## 2020-08-24 MED ORDER — BENZONATATE 100 MG PO CAPS: 100.0000 mg | ORAL_CAPSULE | Freq: Three times a day (TID) | ORAL | 0 refills | Status: DC | PRN

## 2020-08-24 NOTE — Progress Notes (Signed)

## 2020-08-31 DIAGNOSIS — E119 Type 2 diabetes mellitus without complications: Secondary | ICD-10-CM | POA: Diagnosis not present

## 2020-08-31 DIAGNOSIS — K219 Gastro-esophageal reflux disease without esophagitis: Secondary | ICD-10-CM | POA: Diagnosis not present

## 2020-08-31 DIAGNOSIS — Z1321 Encounter for screening for nutritional disorder: Secondary | ICD-10-CM | POA: Diagnosis not present

## 2020-08-31 DIAGNOSIS — Z7689 Persons encountering health services in other specified circumstances: Secondary | ICD-10-CM | POA: Diagnosis not present

## 2020-09-01 ENCOUNTER — Other Ambulatory Visit: Payer: Self-pay | Admitting: Family Medicine

## 2020-09-01 DIAGNOSIS — E785 Hyperlipidemia, unspecified: Secondary | ICD-10-CM

## 2020-09-01 NOTE — Telephone Encounter (Signed)
Requested Prescriptions  Pending Prescriptions Disp Refills  . pravastatin (PRAVACHOL) 40 MG tablet [Pharmacy Med Name: Pravastatin Sodium 40 MG Oral Tablet] 90 tablet 0    Sig: TAKE 1 TABLET BY MOUTH AT BEDTIME     Cardiovascular:  Antilipid - Statins Failed - 09/01/2020  5:30 AM      Failed - Total Cholesterol in normal range and within 360 days    Cholesterol, Total  Date Value Ref Range Status  08/31/2019 190 100 - 199 mg/dL Final         Failed - LDL in normal range and within 360 days    LDL Chol Calc (NIH)  Date Value Ref Range Status  08/31/2019 96 0 - 99 mg/dL Final         Failed - HDL in normal range and within 360 days    HDL  Date Value Ref Range Status  08/31/2019 42 >39 mg/dL Final         Failed - Triglycerides in normal range and within 360 days    Triglycerides  Date Value Ref Range Status  08/31/2019 307 (H) 0 - 149 mg/dL Final         Passed - Patient is not pregnant      Passed - Valid encounter within last 12 months    Recent Outpatient Visits          5 months ago Frequent urination   [facility], Delfino Lovett, NP   6 months ago Shortness of breath   [facility], Delfino Lovett, NP   6 months ago Shortness of breath   [facility] Delfino Lovett, NP   9 months ago Palpitations   [facility], Delfino Lovett, NP   9 months ago Lower abdominal pain   [facility], Delfino Lovett, NP

## 2020-10-05 ENCOUNTER — Other Ambulatory Visit: Payer: Self-pay | Admitting: Physician Assistant

## 2020-10-10 ENCOUNTER — Other Ambulatory Visit: Payer: Self-pay | Admitting: Orthopaedic Surgery

## 2020-10-12 ENCOUNTER — Encounter: Payer: Self-pay | Admitting: Orthopaedic Surgery

## 2020-10-13 ENCOUNTER — Other Ambulatory Visit: Payer: Self-pay | Admitting: Physician Assistant

## 2020-10-13 MED ORDER — DICLOFENAC SODIUM 75 MG PO TBEC: 75.0000 mg | DELAYED_RELEASE_TABLET | Freq: Two times a day (BID) | ORAL | 0 refills | Status: DC

## 2020-10-13 NOTE — Telephone Encounter (Signed)
Sent in 180.  Will you make sure she checks with pcp to ensure they are ok with her taking this twice daily for 6 months or longer as we have already filled 180 once before

## 2020-10-26 ENCOUNTER — Other Ambulatory Visit: Payer: Self-pay | Admitting: Family Medicine

## 2020-10-26 DIAGNOSIS — R609 Edema, unspecified: Secondary | ICD-10-CM

## 2020-10-26 DIAGNOSIS — I10 Essential (primary) hypertension: Secondary | ICD-10-CM

## 2020-10-27 NOTE — Telephone Encounter (Signed)
Based on previous note, I would suspect coming from back.  She can try alternating nsaids with tylenol.  We can start her in PT as well.  If no better, have her come back in for reevaluation

## 2020-11-02 ENCOUNTER — Other Ambulatory Visit: Payer: Self-pay | Admitting: Physician Assistant

## 2020-11-02 ENCOUNTER — Telehealth: Payer: BC Managed Care – PPO | Admitting: Family

## 2020-11-02 DIAGNOSIS — K047 Periapical abscess without sinus: Secondary | ICD-10-CM | POA: Diagnosis not present

## 2020-11-02 MED ORDER — CLINDAMYCIN HCL 300 MG PO CAPS: 300.0000 mg | ORAL_CAPSULE | Freq: Four times a day (QID) | ORAL | 0 refills | Status: AC

## 2020-11-02 NOTE — Progress Notes (Signed)
E-Visit for Dental Pain  We are sorry that you are not feeling well.  Here is how we plan to help!  Based on what you have shared with me in the questionnaire, it sounds like you have dental abscess.   Clindamycin 300mg  4 times per day for 7 days  It is imperative that you see a dentist within 10 days of this eVisit to determine the cause of the dental pain and be sure it is adequately treated  A toothache or tooth pain is caused when the nerve in the root of a tooth or surrounding a tooth is irritated. Dental (tooth) infection, decay, injury, or loss of a tooth are the most common causes of dental pain. Pain may also occur after an extraction (tooth is pulled out). Pain sometimes originates from other areas and radiates to the jaw, thus appearing to be tooth pain.Bacteria growing inside your mouth can contribute to gum disease and dental decay, both of which can cause pain. A toothache occurs from inflammation of the central portion of the tooth called pulp. The pulp contains nerve endings that are very sensitive to pain. Inflammation to the pulp or pulpitis may be caused by dental cavities, trauma, and infection.    HOME CARE:   For toothaches: . Over-the-counter pain medications such as acetaminophen or ibuprofen may be used. Take these as directed on the package while you arrange for a dental appointment. . Avoid very cold or hot foods, because they may make the pain worse. . You may get relief from biting on a cotton ball soaked in oil of cloves. You can get oil of cloves at most drug stores.  For jaw pain: .  Aspirin may be helpful for problems in the joint of the jaw in adults. . If pain happens every time you open your mouth widely, the temporomandibular joint (TMJ) may be the source of the pain. Yawning or taking a large bite of food may worsen the pain. An appointment with your doctor or dentist will help you find the cause.     GET HELP RIGHT AWAY IF:  . You have a high fever  or chills . If you have had a recent head or face injury and develop headache, light headedness, nausea, vomiting, or other symptoms that concern you after an injury to your face or mouth, you could have a more serious injury in addition to your dental injury. . A facial rash associated with a toothache: This condition may improve with medication. Contact your doctor for them to decide what is appropriate. . Any jaw pain occurring with chest pain: Although jaw pain is most commonly caused by dental disease, it is sometimes referred pain from other areas. People with heart disease, especially people who have had stents placed, people with diabetes, or those who have had heart surgery may have jaw pain as a symptom of heart attack or angina. If your jaw or tooth pain is associated with lightheadedness, sweating, or shortness of breath, you should see a doctor as soon as possible. . Trouble swallowing or excessive pain or bleeding from gums: If you have a history of a weakened immune system, diabetes, or steroid use, you may be more susceptible to infections. Infections can often be more severe and extensive or caused by unusual organisms. Dental and gum infections in people with these conditions may require more aggressive treatment. An abscess may need draining or IV antibiotics, for example.  MAKE SURE YOU    Understand these instructions.  Will watch your condition.  Will get help right away if you are not doing well or get worse.  Thank you for choosing an e-visit. Your e-visit answers were reviewed by a board certified advanced clinical practitioner to complete your personal care plan. Depending upon the condition, your plan could have included both over the counter or prescription medications. Please review your pharmacy choice. Make sure the pharmacy is open so you can pick up prescription now. If there is a problem, you may contact your provider through [employer] and have the  prescription routed to another pharmacy. Your safety is important to [place]. If you have drug allergies check your prescription carefully.  For the next 24 hours you can use MyChart to ask questions about today's visit, request a non-urgent call back, or ask for a work or school excuse. You will get an email in the next two days asking about your experience. I hope that your e-visit has been valuable and will speed your recovery.  Approximately 5 minutes was spent documenting and reviewing patient's chart.

## 2020-11-29 DIAGNOSIS — Z0100 Encounter for examination of eyes and vision without abnormal findings: Secondary | ICD-10-CM | POA: Diagnosis not present

## 2020-11-29 DIAGNOSIS — K219 Gastro-esophageal reflux disease without esophagitis: Secondary | ICD-10-CM | POA: Diagnosis not present

## 2020-11-29 DIAGNOSIS — Z136 Encounter for screening for cardiovascular disorders: Secondary | ICD-10-CM | POA: Diagnosis not present

## 2020-11-29 DIAGNOSIS — D529 Folate deficiency anemia, unspecified: Secondary | ICD-10-CM | POA: Diagnosis not present

## 2020-11-29 DIAGNOSIS — E119 Type 2 diabetes mellitus without complications: Secondary | ICD-10-CM | POA: Diagnosis not present

## 2020-11-30 ENCOUNTER — Telehealth: Payer: Self-pay | Admitting: *Deleted

## 2020-11-30 NOTE — Telephone Encounter (Signed)
Left message for patient to call, ECG reviewed and compared with last ECG by dr berry. Left message for patient to call to discuss getting an appointment to be seen sooner than august.

## 2020-11-30 NOTE — Telephone Encounter (Signed)
Spoke with pt, the ECG we received was done at her yearly physical. She reports she is not really having any problems. She said they told her if she did not have an echo already scheduled they would be ordering one. Aware will show the last ECG from 6/21 to dr berry to compare with the one received.

## 2020-11-30 NOTE — Telephone Encounter (Signed)
Received fax ECG from [facility]. There are changes from previous. Need to find out if she is having any problems. She has an echo and follow up appointment in august.

## 2020-11-30 NOTE — Telephone Encounter (Signed)
Patient scheduled to see Almyra Deforest pa-c Monday 12-05-20 @ 2:45 pm if she can come to that appointment.

## 2020-11-30 NOTE — Telephone Encounter (Signed)
Patient was returning call 

## 2020-12-01 NOTE — Telephone Encounter (Signed)
Spoke with pt, aware of appointment date and time.

## 2020-12-05 ENCOUNTER — Other Ambulatory Visit: Payer: Self-pay

## 2020-12-05 ENCOUNTER — Ambulatory Visit: Payer: BC Managed Care – PPO | Admitting: Physician Assistant

## 2020-12-05 VITALS — BP 132/88 | HR 72 | Ht 67.0 in | Wt 211.0 lb

## 2020-12-05 DIAGNOSIS — E119 Type 2 diabetes mellitus without complications: Secondary | ICD-10-CM

## 2020-12-05 DIAGNOSIS — R9431 Abnormal electrocardiogram [ECG] [EKG]: Secondary | ICD-10-CM | POA: Diagnosis not present

## 2020-12-05 DIAGNOSIS — I10 Essential (primary) hypertension: Secondary | ICD-10-CM | POA: Diagnosis not present

## 2020-12-05 DIAGNOSIS — E785 Hyperlipidemia, unspecified: Secondary | ICD-10-CM

## 2020-12-05 NOTE — Patient Instructions (Addendum)
Medication Instructions:  Your physician recommends that you continue on your current medications as directed. Please refer to the Current Medication list given to you today.  *If you need a refill on your cardiac medications before your next appointment, please call your pharmacy*  Lab Work: NONE ordered at this time of appointment   If you have labs (blood work) drawn today and your tests are completely normal, you will receive your results only by: [facility] (if you have MyChart) OR A paper copy in the mail If you have any lab test that is abnormal or we need to change your treatment, we will call you to review the results.  Testing/Procedures: NONE ordered at this time of appointment   Follow-Up: At [facility], you and your health needs are our priority.  As part of our continuing mission to provide you with exceptional heart care, we have created designated Provider Care Teams.  These Care Teams include your primary Cardiologist (physician) and Advanced Practice Providers (APPs -  Physician Assistants and Nurse Practitioners) who all work together to provide you with the care you need, when you need it.  Your next appointment:   As scheduled-02/08/21 at 1:30 PM   The format for your next appointment:   In Person  Provider:   Quay Burow, MD  Other Instructions

## 2020-12-05 NOTE — Progress Notes (Signed)
Cardiology Office Note:    Date:  12/07/2020   ID:  Angelica Sharp, DOB [birth year], MRN [id number]  PCP:  Barnetta Chapel, NP   [facility] Providers Cardiologist:  Quay Burow, MD     Referring MD: Wendie Agreste, MD   Chief Complaint  Patient presents with   Follow-up    Seen for Dr. Gwenlyn Found, abnormal EKG     History of Present Illness:    Angelica Sharp is a 55 y.o. female with a hx of hypertension, hyperlipidemia, DM 2 and GERD.  Patient was initially referred to cardiology service for evaluation of palpitation, dyspnea and atypical chest pain.  She never had a heart attack or stroke in the past.  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  Her monitor obtained in August 2021 showed predominantly sinus rhythm with occasional PVCs and couplets, short runs of SVT.  Coronary calcium score was 0.  By the time she followed up with Dr. Gwenlyn Found in August 2021, her symptom has largely resolved.  Annual 2D echo was recommended for dilated aorta.  Patient presents today for evaluation of abnormal EKG.  She was just seen at her PCPs office on 11/29/2020 for annual physical when an EKG was repeated that noted the T wave inversion in V1 and V2.  Looking back, she had a prior T wave inversion on previous EKG in 2020 and 2021.  However she had a similar T wave inversion on EKG back in 2017.  She had upper back pain recently however no obvious chest pain.  Last month, she also had pain radiating down the left arm but this has resolved.  I discussed her case with Dr. Martinique DOD, at this time, given the previous reassuring echocardiogram and coronary calcium score from 2021, we do not recommend any further work-up unless she has chest pain with exertion.  Past Medical History:  Diagnosis Date   Allergy    Anemia    Anxiety    Arthritis    Borderline hypertension    Chest pain    Complication of anesthesia    Degenerative  lumbar disc    L5S1   Diabetes mellitus without complication (HCC)    Diverticulitis    Dyspnea    Edema    lower extremity   Family history of anesthesia complication    sister had BP complications during anes.   Fatty liver    GERD (gastroesophageal reflux disease)    H/O hiatal hernia    Headache    HLD (hyperlipidemia)    Hypertension    Pneumonia    PONV (postoperative nausea and vomiting)    SOB (shortness of breath)     Past Surgical History:  Procedure Laterality Date   BREAST LUMPECTOMY     4 lumps -- two from right -- one from left   LAPAROSCOPIC ASSISTED VAGINAL HYSTERECTOMY N/A 09/18/2012   Procedure: LAPAROSCOPIC ASSISTED VAGINAL HYSTERECTOMY;  Surgeon: Linda Hedges, DO;  Location: [facility];  Service: Gynecology;  Laterality: N/A;  BLADDER  DISTENTION   TOTAL HIP ARTHROPLASTY Left 11/10/2018   Procedure: LEFT TOTAL HIP ARTHROPLASTY ANTERIOR APPROACH;  Surgeon: Leandrew Koyanagi, MD;  Location: [facility];  Service: Orthopedics;  Laterality: Left;   TUBAL LIGATION      Current Medications: Current Meds  Medication Sig   Calcium Carb-Cholecalciferol (CALCIUM+D3 PO) Take 1 tablet by mouth daily.   diclofenac (VOLTAREN) 75 MG EC tablet Take 1  tablet (75 mg total) by mouth 2 (two) times daily.   Ferrous Sulfate (IRON PO) Take by mouth.   hydrochlorothiazide (HYDRODIURIL) 25 MG tablet Take 1 tablet by mouth once daily   loperamide (IMODIUM A-D) 2 MG tablet Take 4 mg by mouth 4 (four) times daily as needed for diarrhea or loose stools.    metFORMIN (GLUCOPHAGE) 500 MG tablet Take 1 tablet by mouth in the morning, at noon, and at bedtime.   omeprazole (PRILOSEC) 40 MG capsule Take 1 capsule (40 mg total) by mouth in the morning and at bedtime.   Phenazopyridine HCl (AZO URINARY PAIN PO) Take 2 tablets by mouth daily as needed (urinary discomfort).    polyethylene glycol (MIRALAX / GLYCOLAX) packet Take 17 g by mouth daily as needed for mild constipation.    Potassium 99 MG TABS Take  99-198 mg by mouth See admin instructions. Take 198 mg in the morning and 99 mg at night   pravastatin (PRAVACHOL) 40 MG tablet TAKE 1 TABLET BY MOUTH AT BEDTIME     Allergies:   Sulfa antibiotics, Morphine and related, and Septra [sulfamethoxazole-trimethoprim]   Social History   Socioeconomic History   Marital status: Divorced    Spouse name: Not on file   Number of children: 3   Years of education: Not on file   Highest education level: Not on file  Occupational History   Occupation: Mortgage    Employer: [employer]    Comment: [employer] - now works from home  Tobacco Use   Smoking status: Never   Smokeless tobacco: Never  Vaping Use   Vaping Use: Never used  Substance and Sexual Activity   Alcohol use: No   Drug use: No   Sexual activity: Not Currently  Other Topics Concern   Not on file  Social History Narrative   Lives with daughter and grandkids.   Social Determinants of Health   Financial Resource Strain: Not on file  Food Insecurity: Not on file  Transportation Needs: Not on file  Physical Activity: Not on file  Stress: Not on file  Social Connections: Not on file     Family History: The patient's family history includes Cancer in her nephew and sister; Clotting disorder in her father; Crohn's disease in her niece; Diabetes in her mother and sister; Diabetes (age of onset: 75) in her son; Heart attack in her mother; Heart disease in her sister; Hyperlipidemia in her mother; Hypertension in her mother; Kidney cancer in her sister; Kidney disease in her sister; Pulmonary embolism in her father; Stroke in her sister. There is no history of Stomach cancer, Rectal cancer, Esophageal cancer, or Colon cancer.  ROS:   Please see the history of present illness.     All other systems reviewed and are negative.  EKGs/Labs/Other Studies Reviewed:    The following studies were reviewed today:  Echo 12/29/2019 1. Left ventricular ejection fraction, by  estimation, is 55 to 60%. The  left ventricle has normal function. The left ventricle has no regional  wall motion abnormalities. There is mild left ventricular hypertrophy.  Left ventricular diastolic parameters  were normal.   2. Right ventricular systolic function is normal. The right ventricular  size is normal. Tricuspid regurgitation signal is inadequate for assessing  PA pressure.   3. The mitral valve is normal in structure. No evidence of mitral valve  regurgitation.   4. The aortic valve was not well visualized. Aortic valve regurgitation  is not  visualized. No aortic stenosis is present.   5. Aortic dilatation noted. There is dilatation of the ascending aorta  measuring 41 mm.   6. The inferior vena cava is normal in size with greater than 50%  respiratory variability, suggesting right atrial pressure of 3 mmHg.   EKG:  EKG is ordered today.  The ekg ordered today demonstrates normal sinus rhythm, T wave inversion in lead V1 and V2  Recent Labs: 01/20/2020: Platelets 260.0 03/04/2020: ALT 33; BUN 11; Creatinine, Ser 0.91; Hemoglobin 13.6; Potassium 4.4; Sodium 145; TSH 0.887  Recent Lipid Panel    Component Value Date/Time   CHOL 190 08/31/2019 1447   TRIG 307 (H) 08/31/2019 1447   HDL 42 08/31/2019 1447   CHOLHDL 4.5 (H) 08/31/2019 1447   CHOLHDL 6.9 (H) 03/29/2015 1249   VLDL 49 (H) 03/29/2015 1249   LDLCALC 96 08/31/2019 1447     Risk Assessment/Calculations:           Physical Exam:    VS:  BP 132/88   Pulse 72   Ht 5\' 7"  (1.702 m)   Wt 211 lb (95.7 kg)   LMP 08/10/2012 Comment: ovaries remain//a.c.  SpO2 96%   BMI 33.05 kg/m     Wt Readings from Last 3 Encounters:  12/05/20 211 lb (95.7 kg)  03/23/20 205 lb (93 kg)  03/04/20 199 lb 6.4 oz (90.4 kg)     GEN:  Well nourished, well developed in no acute distress HEENT: Normal NECK: No JVD; No carotid bruits LYMPHATICS: No lymphadenopathy CARDIAC: RRR, no murmurs, rubs, gallops RESPIRATORY:   Clear to auscultation without rales, wheezing or rhonchi  ABDOMEN: Soft, non-tender, non-distended MUSCULOSKELETAL:  No edema; No deformity  SKIN: Warm and dry NEUROLOGIC:  Alert and oriented x 3 PSYCHIATRIC:  Normal affect   ASSESSMENT:    1. Abnormal EKG   2. Essential hypertension   3. Hyperlipidemia LDL goal <70   4. Controlled type 2 diabetes mellitus without complication, without long-term current use of insulin (HCC)    PLAN:    In order of problems listed above:  Abnormal EKG: Patient presented today to evaluate abnormal EKG that showed T wave inversion in lead V1 and V2.  Looking back, this was present on the previous EKG obtained back in 2017, however EKG from 2021 at 2021 both showed upright T waves.  She had a coronary calcium score of 0 in 2021.  I discussed her case with DOD Dr. Martinique, given no obvious anginal symptoms and a reassuring study from last year, we do not recommend any further work-up.  Hypertension: Blood pressure stable  Hyperlipidemia: On pravastatin  DM2: Managed by primary care provider.         Medication Adjustments/Labs and Tests Ordered: Current medicines are reviewed at length with the patient today.  Concerns regarding medicines are outlined above.  Orders Placed This Encounter  Procedures   EKG 12-Lead   No orders of the defined types were placed in this encounter.   Patient Instructions  Medication Instructions:  Your physician recommends that you continue on your current medications as directed. Please refer to the Current Medication list given to you today.  *If you need a refill on your cardiac medications before your next appointment, please call your pharmacy*  Lab Work: NONE ordered at this time of appointment   If you have labs (blood work) drawn today and your tests are completely normal, you will receive your results only by: [place] (if you have  MyChart) OR A paper copy in the mail If you have any lab test  that is abnormal or we need to change your treatment, we will call you to review the results.  Testing/Procedures: NONE ordered at this time of appointment   Follow-Up: At [facility], you and your health needs are our priority.  As part of our continuing mission to provide you with exceptional heart care, we have created designated Provider Care Teams.  These Care Teams include your primary Cardiologist (physician) and Advanced Practice Providers (APPs -  Physician Assistants and Nurse Practitioners) who all work together to provide you with the care you need, when you need it.  Your next appointment:   As scheduled-02/08/21 at 1:30 PM   The format for your next appointment:   In Person  Provider:   Quay Burow, MD  Other Instructions    Signed, Almyra Deforest, [place]  12/07/2020 11:13 PM    [place]

## 2020-12-07 ENCOUNTER — Encounter: Payer: Self-pay | Admitting: Physician Assistant

## 2020-12-29 DIAGNOSIS — Z1231 Encounter for screening mammogram for malignant neoplasm of breast: Secondary | ICD-10-CM | POA: Diagnosis not present

## 2021-01-25 DIAGNOSIS — Z780 Asymptomatic menopausal state: Secondary | ICD-10-CM | POA: Diagnosis not present

## 2021-01-28 ENCOUNTER — Encounter: Payer: Self-pay | Admitting: Physician Assistant

## 2021-01-28 ENCOUNTER — Telehealth: Payer: BC Managed Care – PPO | Admitting: Physician Assistant

## 2021-01-28 DIAGNOSIS — N390 Urinary tract infection, site not specified: Secondary | ICD-10-CM

## 2021-01-28 MED ORDER — NITROFURANTOIN MONOHYD MACRO 100 MG PO CAPS: 100.0000 mg | ORAL_CAPSULE | Freq: Two times a day (BID) | ORAL | 0 refills | Status: DC

## 2021-01-28 NOTE — Progress Notes (Signed)
E-Visit for Urinary Problems  We are sorry that you are not feeling well.  Here is how we plan to help!  Based on what you shared with me it looks like you most likely have a simple urinary tract infection.  A UTI (Urinary Tract Infection) is a bacterial infection of the bladder.  Most cases of urinary tract infections are simple to treat but a key part of your care is to encourage you to drink plenty of fluids and watch your symptoms carefully.  I have prescribed MacroBid 100 mg twice a day for 5 days.  Your symptoms should gradually improve. Call us if the burning in your urine worsens, you develop worsening fever, back pain or pelvic pain or if your symptoms do not resolve after completing the antibiotic.  Urinary tract infections can be prevented by drinking plenty of water to keep your body hydrated.  Also be sure when you wipe, wipe from front to back and don't hold it in!  If possible, empty your bladder every 4 hours.  HOME CARE Drink plenty of fluids Compete the full course of the antibiotics even if the symptoms resolve Remember, when you need to go.go. Holding in your urine can increase the likelihood of getting a UTI! GET HELP RIGHT AWAY IF: You cannot urinate You get a high fever Worsening back pain occurs You see blood in your urine You feel sick to your stomach or throw up You feel like you are going to pass out  MAKE SURE YOU  Understand these instructions. Will watch your condition. Will get help right away if you are not doing well or get worse.   Thank you for choosing an e-visit.  Your e-visit answers were reviewed by a board certified advanced clinical practitioner to complete your personal care plan. Depending upon the condition, your plan could have included both over the counter or prescription medications.  Please review your pharmacy choice. Make sure the pharmacy is open so you can pick up prescription now. If there is a problem, you may contact your  provider through [employer] and have the prescription routed to another pharmacy.  Your safety is important to [place]. If you have drug allergies check your prescription carefully.   For the next 24 hours you can use MyChart to ask questions about today's visit, request a non-urgent call back, or ask for a work or school excuse. You will get an email in the next two days asking about your experience. I hope that your e-visit has been valuable and will speed your recovery. I spent 5-10 minutes on review and completion of this note- Lacy Duverney [facility]

## 2021-01-31 ENCOUNTER — Ambulatory Visit (HOSPITAL_COMMUNITY): Payer: BC Managed Care – PPO | Attending: Cardiology

## 2021-01-31 ENCOUNTER — Other Ambulatory Visit: Payer: Self-pay

## 2021-01-31 DIAGNOSIS — I77810 Thoracic aortic ectasia: Secondary | ICD-10-CM | POA: Diagnosis not present

## 2021-01-31 LAB — ECHOCARDIOGRAM COMPLETE
Area-P 1/2: 3.21 cm2
S' Lateral: 2.7 cm

## 2021-02-08 ENCOUNTER — Ambulatory Visit: Payer: BC Managed Care – PPO | Admitting: Cardiovascular Disease

## 2021-02-08 ENCOUNTER — Encounter: Payer: Self-pay | Admitting: Cardiovascular Disease

## 2021-02-08 ENCOUNTER — Other Ambulatory Visit: Payer: Self-pay

## 2021-02-08 VITALS — BP 133/72 | HR 70 | Ht 67.0 in | Wt 213.0 lb

## 2021-02-08 DIAGNOSIS — I712 Thoracic aortic aneurysm, without rupture: Secondary | ICD-10-CM

## 2021-02-08 DIAGNOSIS — I10 Essential (primary) hypertension: Secondary | ICD-10-CM

## 2021-02-08 DIAGNOSIS — R002 Palpitations: Secondary | ICD-10-CM

## 2021-02-08 DIAGNOSIS — E782 Mixed hyperlipidemia: Secondary | ICD-10-CM | POA: Diagnosis not present

## 2021-02-08 DIAGNOSIS — I7121 Aneurysm of the ascending aorta, without rupture: Secondary | ICD-10-CM

## 2021-02-08 DIAGNOSIS — R0789 Other chest pain: Secondary | ICD-10-CM | POA: Diagnosis not present

## 2021-02-08 NOTE — Assessment & Plan Note (Signed)
History of palpitations with event monitoring showing occasional PACs and short runs of SVT which improved with the on iron repletion therapy.

## 2021-02-08 NOTE — Patient Instructions (Signed)
  Testing/Procedures:  Your physician has requested that you have an echocardiogram. Echocardiography is a painless test that uses sound waves to create images of your heart. It provides your doctor with information about the size and shape of your heart and how well your heart's chambers and valves are working. This procedure takes approximately one hour. There are no restrictions for this procedure. [place] 02/2023   Follow-Up: At [facility], you and your health needs are our priority.  As part of our continuing mission to provide you with exceptional heart care, we have created designated Provider Care Teams.  These Care Teams include your primary Cardiologist (physician) and Advanced Practice Providers (APPs -  Physician Assistants and Nurse Practitioners) who all work together to provide you with the care you need, when you need it.  We recommend signing up for the patient portal called "MyChart".  Sign up information is provided on this After Visit Summary.  MyChart is used to connect with patients for Virtual Visits (Telemedicine).  Patients are able to view lab/test results, encounter notes, upcoming appointments, etc.  Non-urgent messages can be sent to your provider as well.   To learn more about what you can do with MyChart, go to [email or web address].    Your next appointment:    AS NEEDED

## 2021-02-08 NOTE — Progress Notes (Signed)
02/08/2021 Angelica Sharp   11-Feb-1966  [id number]  Primary Physician Barnetta Chapel, NP Primary Cardiologist: Lorretta Harp MD Lupe Carney, [place]  HPI:  Angelica Sharp is a 55 y.o.  mildly overweight divorced Caucasian female mother of 33, grandmother for grandchildren who works at [employer] in the [place].  She was referred by Maximiano Coss , NP for evaluation of dyspnea, atypical chest pain and palpitations.  I last saw her in the office 02/09/2020.  Risk factors include treated hypertension, diabetes and hyperlipidemia.  She does not smoke.  She has had a brothers had stenting.  She is never had a heart attack or stroke.  She is noticed dyspnea over the last month with some atypical chest pain rating to her back.  She also had palpitations for last 4 weeks.  She drinks 1 cup of caffeinated beverages a day.  She exercises minimally and walks on occasion.  I obtain a 2D echo which was essentially normal other than mildly dilated thoracic thoracic aorta measuring 40 mm, similar to what 2D echo recently performed in follow-up 01/31/2021.  A coronary calcium score was 0 and event monitor performed 12/15/2019 showed occasional PVCs and couplets with short runs of SVT all of which resolved with iron repletion.  Since I saw her a year ago she is remained stable denying chest pain or shortness of breath.   Current Meds  Medication Sig   Calcium Carb-Cholecalciferol (CALCIUM+D3 PO) Take 1 tablet by mouth daily.   diclofenac (VOLTAREN) 75 MG EC tablet Take 1 tablet (75 mg total) by mouth 2 (two) times daily.   Ferrous Sulfate (IRON PO) Take by mouth.   glucose blood test strip Use as instructed   hydrochlorothiazide (HYDRODIURIL) 25 MG tablet Take 1 tablet by mouth once daily   loperamide (IMODIUM A-D) 2 MG tablet Take 4 mg by mouth 4 (four) times daily as needed for diarrhea or loose stools.    metFORMIN (GLUCOPHAGE) 500 MG tablet Take 1 tablet by mouth in the morning, at noon, and  at bedtime.   penicillin v potassium (VEETID) 500 MG tablet Take 500 mg by mouth 3 (three) times daily.   Phenazopyridine HCl (AZO URINARY PAIN PO) Take 2 tablets by mouth daily as needed (urinary discomfort).    polyethylene glycol (MIRALAX / GLYCOLAX) packet Take 17 g by mouth daily as needed for mild constipation.    Potassium 99 MG TABS Take 99-198 mg by mouth See admin instructions. Take 198 mg in the morning and 99 mg at night   pravastatin (PRAVACHOL) 40 MG tablet TAKE 1 TABLET BY MOUTH AT BEDTIME     Allergies  Allergen Reactions   Sulfa Antibiotics Hives and Other (See Comments)    Heart Rate goes up   Morphine And Related Other (See Comments)    Headache and hallucinations   Septra [Sulfamethoxazole-Trimethoprim] Other (See Comments)    TACHYCARDIA    Social History   Socioeconomic History   Marital status: Divorced    Spouse name: Not on file   Number of children: 3   Years of education: Not on file   Highest education level: Not on file  Occupational History   Occupation: Mortgage    Employer: [employer]    Comment: [employer] - now works from home  Tobacco Use   Smoking status: Never   Smokeless tobacco: Never  Vaping Use   Vaping Use: Never used  Substance and Sexual Activity  Alcohol use: No   Drug use: No   Sexual activity: Not Currently  Other Topics Concern   Not on file  Social History Narrative   Lives with daughter and grandkids.   Social Determinants of Health   Financial Resource Strain: Not on file  Food Insecurity: Not on file  Transportation Needs: Not on file  Physical Activity: Not on file  Stress: Not on file  Social Connections: Not on file  Intimate Partner Violence: Not on file     Review of Systems: General: negative for chills, fever, night sweats or weight changes.  Cardiovascular: negative for chest pain, dyspnea on exertion, edema, orthopnea, palpitations, paroxysmal nocturnal dyspnea or shortness of  breath Dermatological: negative for rash Respiratory: negative for cough or wheezing Urologic: negative for hematuria Abdominal: negative for nausea, vomiting, diarrhea, bright red blood per rectum, melena, or hematemesis Neurologic: negative for visual changes, syncope, or dizziness All other systems reviewed and are otherwise negative except as noted above.    Blood pressure 133/72, pulse 70, height '5\' 7"'$  (1.702 m), weight 213 lb (96.6 kg), last menstrual period 08/10/2012, SpO2 97 %.  General appearance: alert and no distress Neck: no adenopathy, no carotid bruit, no JVD, supple, symmetrical, trachea midline, and thyroid not enlarged, symmetric, no tenderness/mass/nodules Lungs: clear to auscultation bilaterally Heart: regular rate and rhythm, S1, S2 normal, no murmur, click, rub or gallop Extremities: extremities normal, atraumatic, no cyanosis or edema Pulses: 2+ and symmetric Skin: Skin color, texture, turgor normal. No rashes or lesions  EKG performed today  ASSESSMENT AND PLAN:   Atypical chest pain Atypical chest pain with a coronary calcium score of 0 suggesting noncardiac cause.  Hyperlipidemia History of hyperlipidemia on statin therapy followed by her PCP  Essential hypertension History of essential hypertension blood pressure measured today at 133/72.  She is on hydrochlorothiazide.  Palpitations History of palpitations with event monitoring showing occasional PACs and short runs of SVT which improved with the on iron repletion therapy.     Lorretta Harp MD FACP,FACC,FAHA, [facility] 02/08/2021 1:45 PM

## 2021-02-08 NOTE — Assessment & Plan Note (Signed)
History of essential hypertension blood pressure measured today at 133/72.  She is on hydrochlorothiazide.

## 2021-02-08 NOTE — Assessment & Plan Note (Signed)
Atypical chest pain with a coronary calcium score of 0 suggesting noncardiac cause.

## 2021-02-08 NOTE — Assessment & Plan Note (Signed)
History of hyperlipidemia on statin therapy followed by her PCP. 

## 2021-03-12 IMAGING — CT CT ABD-PELV W/O CM
1 series · 1 of 1 positions shown · non-contrast
Comparison: CT 03/08/2017

CLINICAL DATA: Decreasing hemoglobin and abdominal pain for 1
month, no history of malignancy, nonsmoker, possible diverticulitis

EXAM:
CT ABDOMEN AND PELVIS WITHOUT CONTRAST
TECHNIQUE: Multidetector CT imaging of the abdomen and pelvis was performed
following the standard protocol without IV contrast.

[Series 1: topogram 0.6 t20f · coronal · 0.6mm · 1.00mm/px · 1 of 1 slices shown]
[im 1/1]
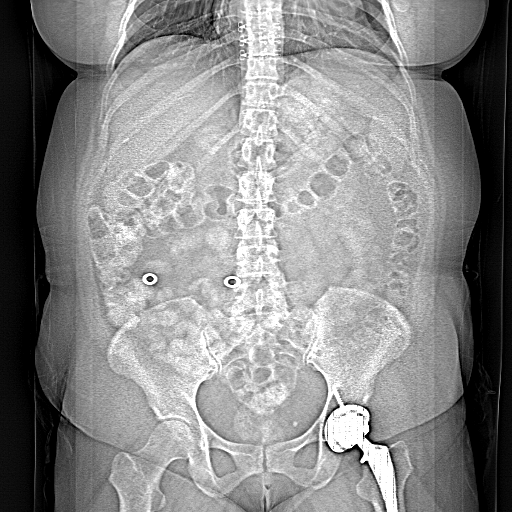

[1 of 1 positions shown; findings below may reference images not displayed]

FINDINGS: Lower chest: Some stable bandlike areas of opacity in the left lung
base could reflect atelectasis or scarring. Lung bases otherwise
clear. Hypoattenuation of the cardiac blood pool relative the
myocardium compatible with anemia. Cardiac size within normal
limits. Trace pericardial fluid similar to prior and likely
physiologically normal for this patient.

Hepatobiliary: No worrisome liver lesions. Few subcentimeter
hypoattenuating foci unchanged from prior, too small to characterize
fully on this examination but likely benign. No visible worrisome
liver lesions. Normal gallbladder and biliary tree without visible
calcified gallstone.

Pancreas: Partial fatty replacement of the pancreas most pronounced
at the pancreatic head and uncinate. No inflammation, ductal
dilatation or discernible pancreatic lesion.

Spleen: Borderline splenomegaly.  No concerning splenic lesion.

Adrenals/Urinary Tract: Normal adrenal glands. Kidneys are symmetric
in size and normally position. Partially exophytic fluid attenuation
cyst measuring 1.6 cm again seen arising from the inferior pole left
kidney. No concerning visible or contour deforming renal lesions.
Stable mild bilateral symmetric perinephric stranding, a nonspecific
finding though may correlate with either age or decreased renal
function. No urolithiasis or hydronephrosis. Urinary bladder is
largely decompressed at the time of exam and therefore poorly
evaluated by CT imaging. No gross bladder abnormality.

Stomach/Bowel: Small sliding-type hiatal hernia. Some irregular
asymmetric nodular thickening is present in the distal thoracic
esophagus, similar to comparison in retrospect ([DATE]). Distal
stomach and duodenum are unremarkable. No small bowel thickening or
dilatation. A normal appendix is visualized. Scattered right and
left colon diverticula are present. No focal diverticular
inflammation to suggest acute diverticulitis. Contrast media
traverses to the level of the rectum without evidence of bowel
obstruction.

Vascular/Lymphatic: Minimal plaque in the abdominal aorta. No
aneurysm or ectasia. No other significant vascular findings. No
suspicious or enlarged lymph nodes in the included lymphatic chains.

Reproductive: Post hysterectomy. Few phleboliths. Some reticular
changes about the left ovary likely chronic scarring, similar to
prior.

Other: No abdominopelvic free fluid or free gas. No bowel containing
hernias.

Musculoskeletal: Multilevel degenerative changes are present in the
imaged portions of the spine. Additional mild degenerative changes
in the pelvis and right hip. Prior left hip total arthroplasty in
expected alignment without acute complication.
IMPRESSION: 1. No acute CT abnormality to provide cause for patient's abdominal
pain.
2. Diverticulosis without evidence of acute diverticulitis.
Diverticulosis may present as a source of GI bleed even in the
absence of inflammation.
3. Hypoattenuation of the cardiac blood pool relative to the
myocardium suggestive of anemia, compatible with patient history.
4. Small sliding-type hiatal hernia with some persistent irregular
asymmetric nodular thickening in the distal thoracic esophagus.
Could reflect sequela of prior esophagitis, however, consider
correlation with outpatient upper endoscopy to exclude underlying
malignancy.
5. Borderline splenomegaly.
6. Aortic Atherosclerosis ([id number]).

## 2021-03-31 DIAGNOSIS — M546 Pain in thoracic spine: Secondary | ICD-10-CM | POA: Diagnosis not present

## 2021-03-31 DIAGNOSIS — M47814 Spondylosis without myelopathy or radiculopathy, thoracic region: Secondary | ICD-10-CM | POA: Diagnosis not present

## 2021-03-31 DIAGNOSIS — R079 Chest pain, unspecified: Secondary | ICD-10-CM | POA: Diagnosis not present

## 2021-03-31 DIAGNOSIS — M25552 Pain in left hip: Secondary | ICD-10-CM | POA: Diagnosis not present

## 2021-03-31 DIAGNOSIS — M545 Low back pain, unspecified: Secondary | ICD-10-CM | POA: Diagnosis not present

## 2021-03-31 DIAGNOSIS — R0602 Shortness of breath: Secondary | ICD-10-CM | POA: Diagnosis not present

## 2021-03-31 DIAGNOSIS — U071 COVID-19: Secondary | ICD-10-CM | POA: Diagnosis not present

## 2021-04-03 ENCOUNTER — Encounter: Payer: Self-pay | Admitting: Orthopaedic Surgery

## 2021-04-03 NOTE — Telephone Encounter (Signed)
We are happy to see her

## 2021-04-04 ENCOUNTER — Other Ambulatory Visit: Payer: Self-pay

## 2021-04-04 ENCOUNTER — Ambulatory Visit: Payer: Self-pay

## 2021-04-04 ENCOUNTER — Ambulatory Visit: Payer: BC Managed Care – PPO | Admitting: Orthopaedic Surgery

## 2021-04-04 VITALS — Ht 67.0 in | Wt 210.0 lb

## 2021-04-04 DIAGNOSIS — M25551 Pain in right hip: Secondary | ICD-10-CM | POA: Diagnosis not present

## 2021-04-04 MED ORDER — PREDNISONE 10 MG (21) PO TBPK: ORAL_TABLET | ORAL | 3 refills | Status: DC

## 2021-04-04 NOTE — Progress Notes (Signed)
Office Visit Note   Patient: Angelica Sharp           Date of Birth: 1965/07/25           MRN: [id number] Visit Date: 04/04/2021              Requested by: Barnetta Chapel, NP [address] [place],  [place] [address] PCP: Barnetta Chapel, NP   Assessment & Plan: Visit Diagnoses:  1. Pain in right hip     Plan: impression is right hip pain coming from the back.  Buttock pain could be from hip but unlikely.  Not having any groin pain.  Recommend prednisone dose pack for now and continue to work from home until after MRI L spine.  She has had extensive conservative management for her back with me and PCP without relief therefore, we will obtain MRI L spine at this time.  Follow up after MRI.  Follow-Up Instructions: No follow-ups on file.   Orders:  Orders Placed This Encounter  Procedures   XR HIP UNILAT W OR W/O PELVIS 1V RIGHT   MR Lumbar Spine w/o contrast   Meds ordered this encounter  Medications   predniSONE (STERAPRED UNI-PAK 21 TAB) 10 MG (21) TBPK tablet    Sig: Take as directed    Dispense:  21 tablet    Refill:  3      Procedures: No procedures performed   Clinical Data: No additional findings.   Subjective: Chief Complaint  Patient presents with   Right Hip - Pain    Dalesha is here for evaluation of right hip pain which is mainly right lower back and buttock pain.  Denies groin pain.  Having trouble lifting and flexing right leg and hip and is reminiscent of left hip pain prior to THA.  Denies any injuries.     Review of Systems  Constitutional: Negative.   HENT: Negative.    Eyes: Negative.   Respiratory: Negative.    Cardiovascular: Negative.   Endocrine: Negative.   Musculoskeletal: Negative.   Neurological: Negative.   Hematological: Negative.   Psychiatric/Behavioral: Negative.    All other systems reviewed and are negative.   Objective: Vital Signs: Ht 5\' 7"  (1.702 m)   Wt 210 lb (95.3 kg)   LMP 08/10/2012 Comment: ovaries  remain//a.c.  BMI 32.89 kg/m   Physical Exam Vitals and nursing note reviewed.  Constitutional:      Appearance: She is well-developed.  Pulmonary:     Effort: Pulmonary effort is normal.  Skin:    General: Skin is warm.     Capillary Refill: Capillary refill takes less than 2 seconds.  Neurological:     Mental Status: She is alert and oriented to person, place, and time.  Psychiatric:        Behavior: Behavior normal.        Thought Content: Thought content normal.        Judgment: Judgment normal.    Ortho Exam Right hip exam - really good ROM with pain, negative logroll - negative SLR sign - pain refers to the low back and buttock  Specialty Comments:  No specialty comments available.  Imaging: XR HIP UNILAT W OR W/O PELVIS 1V RIGHT  Result Date: 04/04/2021 Stable right total hip replacement without complication.  Right hip joint is well preserved.     PMFS History: Patient Active Problem List   Diagnosis Date Noted   Family history of heart disease 12/09/2019   Palpitations  12/09/2019   Sliding hiatal hernia 12/02/2019   Status post total replacement of left hip 11/10/2018   Primary osteoarthritis of left hip 08/22/2018   Essential hypertension 11/25/2017   Foot pain 11/23/2017   Chronic joint pain 03/29/2015   DM II (diabetes mellitus, type II), controlled ([place]) 10/01/2014   Obesity 03/12/2014   Diverticular disease of colon 09/11/2013   Hyperlipidemia 09/10/2013   Family history of cancer 08/30/2012   GERD (gastroesophageal reflux disease) 07/03/2011   Dyspnea on exertion 01/24/2010   Atypical chest pain 01/24/2010   Past Medical History:  Diagnosis Date   Allergy    Anemia    Anxiety    Arthritis    Borderline hypertension    Chest pain    Complication of anesthesia    Degenerative lumbar disc    L5S1   Diabetes mellitus without complication (HCC)    Diverticulitis    Dyspnea    Edema    lower extremity   Family history of anesthesia  complication    sister had BP complications during anes.   Fatty liver    GERD (gastroesophageal reflux disease)    H/O hiatal hernia    Headache    HLD (hyperlipidemia)    Hypertension    Pneumonia    PONV (postoperative nausea and vomiting)    SOB (shortness of breath)     Family History  Problem Relation Age of Onset   Heart attack Mother    Diabetes Mother    Hyperlipidemia Mother    Hypertension Mother    Stroke Sister    Pulmonary embolism Father    Clotting disorder Father    Diabetes Sister    Kidney disease Sister        kidney failure   Heart disease Sister    Cancer Sister        abdominal   Diabetes Son 27       TYPE 1   Kidney cancer Sister    Crohn's disease Niece    Cancer Nephew        gallbladder   Stomach cancer Neg Hx    Rectal cancer Neg Hx    Esophageal cancer Neg Hx    Colon cancer Neg Hx     Past Surgical History:  Procedure Laterality Date   BREAST LUMPECTOMY     4 lumps -- two from right -- one from left   LAPAROSCOPIC ASSISTED VAGINAL HYSTERECTOMY N/A 09/18/2012   Procedure: LAPAROSCOPIC ASSISTED VAGINAL HYSTERECTOMY;  Surgeon: Linda Hedges, DO;  Location: [facility];  Service: Gynecology;  Laterality: N/A;  BLADDER  DISTENTION   TOTAL HIP ARTHROPLASTY Left 11/10/2018   Procedure: LEFT TOTAL HIP ARTHROPLASTY ANTERIOR APPROACH;  Surgeon: Leandrew Koyanagi, MD;  Location: [facility];  Service: Orthopedics;  Laterality: Left;   TUBAL LIGATION     Social History   Occupational History   Occupation: Librarian, academic: Administrator, sports    Comment: [employer] - now works from home  Tobacco Use   Smoking status: Never   Smokeless tobacco: Never  Vaping Use   Vaping Use: Never used  Substance and Sexual Activity   Alcohol use: No   Drug use: No   Sexual activity: Not Currently

## 2021-04-07 ENCOUNTER — Telehealth: Payer: Self-pay | Admitting: Orthopaedic Surgery

## 2021-04-07 NOTE — Telephone Encounter (Signed)
[employer] forms received. To [employer].

## 2021-04-11 DIAGNOSIS — Z90710 Acquired absence of both cervix and uterus: Secondary | ICD-10-CM | POA: Diagnosis not present

## 2021-04-11 DIAGNOSIS — R109 Unspecified abdominal pain: Secondary | ICD-10-CM | POA: Diagnosis not present

## 2021-04-11 DIAGNOSIS — M546 Pain in thoracic spine: Secondary | ICD-10-CM | POA: Diagnosis not present

## 2021-04-11 DIAGNOSIS — K5730 Diverticulosis of large intestine without perforation or abscess without bleeding: Secondary | ICD-10-CM | POA: Diagnosis not present

## 2021-04-23 ENCOUNTER — Other Ambulatory Visit: Payer: BC Managed Care – PPO

## 2021-04-27 ENCOUNTER — Ambulatory Visit: Payer: BC Managed Care – PPO | Admitting: Orthopaedic Surgery

## 2021-04-29 ENCOUNTER — Other Ambulatory Visit: Payer: Self-pay

## 2021-04-29 ENCOUNTER — Ambulatory Visit
Admission: RE | Admit: 2021-04-29 | Discharge: 2021-04-29 | Disposition: A | Discharge status: Home / Self Care | Payer: BC Managed Care – PPO | Source: Ambulatory Visit | Attending: Orthopaedic Surgery | Admitting: Orthopaedic Surgery

## 2021-04-29 DIAGNOSIS — M25551 Pain in right hip: Secondary | ICD-10-CM

## 2021-04-29 DIAGNOSIS — R29898 Other symptoms and signs involving the musculoskeletal system: Secondary | ICD-10-CM | POA: Diagnosis not present

## 2021-04-29 DIAGNOSIS — M5136 Other intervertebral disc degeneration, lumbar region: Secondary | ICD-10-CM | POA: Diagnosis not present

## 2021-04-29 DIAGNOSIS — M545 Low back pain, unspecified: Secondary | ICD-10-CM | POA: Diagnosis not present

## 2021-05-02 ENCOUNTER — Encounter: Payer: Self-pay | Admitting: Orthopaedic Surgery

## 2021-05-02 ENCOUNTER — Ambulatory Visit: Payer: BC Managed Care – PPO | Admitting: Orthopaedic Surgery

## 2021-05-02 ENCOUNTER — Other Ambulatory Visit: Payer: Self-pay

## 2021-05-02 DIAGNOSIS — M545 Low back pain, unspecified: Secondary | ICD-10-CM

## 2021-05-02 DIAGNOSIS — G8929 Other chronic pain: Secondary | ICD-10-CM

## 2021-05-02 NOTE — Progress Notes (Signed)
Office Visit Note   Patient: Angelica Sharp           Date of Birth: 12/03/1965           MRN: [id number] Visit Date: 05/02/2021              Requested by: Angelica Chapel, NP [address] [place],  [place] [address] PCP: Angelica Chapel, NP   Assessment & Plan: Visit Diagnoses:  1. Chronic midline low back pain without sciatica     Plan: Angelica Sharp is here to discuss MRI of the L-spine.  She does feel little bit better since taking the prednisone.  Exam is unchanged.  MRI of the lumbar spine basically shows mild facet arthropathy without any significant bulging disks or stenosis.  We had a discussion on nonsurgical strategies to alleviate the pain to include physical therapy, weight loss, over-the-counter medications.  Follow-up questions encouraged and answered.  Follow-up as needed.  Follow-Up Instructions: No follow-ups on file.   Orders:  No orders of the defined types were placed in this encounter.  No orders of the defined types were placed in this encounter.     Procedures: No procedures performed   Clinical Data: No additional findings.   Subjective: Chief Complaint  Patient presents with   Lower Back - Pain, Follow-up    HPI  Review of Systems   Objective: Vital Signs: LMP 08/10/2012 Comment: ovaries remain//a.c.  Physical Exam  Ortho Exam  Specialty Comments:  No specialty comments available.  Imaging: No results found.   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   Primary osteoarthritis of left hip 08/22/2018   Essential hypertension 11/25/2017   Foot pain 11/23/2017   Chronic joint pain 03/29/2015   DM II (diabetes mellitus, type II), controlled ([place] [place]) 10/01/2014   Obesity 03/12/2014   Diverticular disease of colon 09/11/2013   Hyperlipidemia  09/10/2013   Family history of cancer 08/30/2012   GERD (gastroesophageal reflux disease) 07/03/2011   Dyspnea on exertion 01/24/2010   Atypical chest pain 01/24/2010   Past Medical History:  Diagnosis Date   Allergy    Anemia    Anxiety    Arthritis    Borderline hypertension    Chest pain    Complication of anesthesia    Degenerative lumbar disc    L5S1   Diabetes mellitus without complication (HCC)    Diverticulitis    Dyspnea    Edema    lower extremity   Family history of anesthesia complication    sister had BP complications during anes.   Fatty liver    GERD (gastroesophageal reflux disease)    H/O hiatal hernia    Headache    HLD (hyperlipidemia)    Hypertension    Pneumonia    PONV (postoperative nausea and vomiting)    SOB (shortness of breath)     Family History  Problem Relation Age of Onset   Heart attack Mother    Diabetes Mother    Hyperlipidemia Mother    Hypertension Mother    Stroke Sister    Pulmonary embolism Father    Clotting disorder Father    Diabetes Sister    Kidney disease Sister        kidney failure   Heart disease Sister    Cancer Sister  abdominal   Diabetes Son 27       TYPE 1   Kidney cancer Sister    Crohn's disease Niece    Cancer Nephew        gallbladder   Stomach cancer Neg Hx    Rectal cancer Neg Hx    Esophageal cancer Neg Hx    Colon cancer Neg Hx     Past Surgical History:  Procedure Laterality Date   BREAST LUMPECTOMY     4 lumps -- two from right -- one from left   LAPAROSCOPIC ASSISTED VAGINAL HYSTERECTOMY N/A 09/18/2012   Procedure: LAPAROSCOPIC ASSISTED VAGINAL HYSTERECTOMY;  Surgeon: Linda Hedges, DO;  Location: [facility];  Service: Gynecology;  Laterality: N/A;  BLADDER  DISTENTION   TOTAL HIP ARTHROPLASTY Left 11/10/2018   Procedure: LEFT TOTAL HIP ARTHROPLASTY ANTERIOR APPROACH;  Surgeon: Leandrew Koyanagi, MD;  Location: [facility];  Service: Orthopedics;  Laterality: Left;   TUBAL LIGATION     Social  History   Occupational History   Occupation: Librarian, academic: Administrator, sports    Comment: [employer] - now works from home  Tobacco Use   Smoking status: Never   Smokeless tobacco: Never  Vaping Use   Vaping Use: Never used  Substance and Sexual Activity   Alcohol use: No   Drug use: No   Sexual activity: Not Currently

## 2021-05-06 ENCOUNTER — Other Ambulatory Visit: Payer: BC Managed Care – PPO

## 2021-05-09 DIAGNOSIS — Z1159 Encounter for screening for other viral diseases: Secondary | ICD-10-CM | POA: Diagnosis not present

## 2021-05-09 DIAGNOSIS — R051 Acute cough: Secondary | ICD-10-CM | POA: Diagnosis not present

## 2021-06-18 ENCOUNTER — Telehealth: Payer: BC Managed Care – PPO | Admitting: Emergency Medicine

## 2021-06-18 DIAGNOSIS — R1032 Left lower quadrant pain: Secondary | ICD-10-CM

## 2021-06-18 DIAGNOSIS — R197 Diarrhea, unspecified: Secondary | ICD-10-CM | POA: Diagnosis not present

## 2021-06-18 MED ORDER — AMOXICILLIN-POT CLAVULANATE 875-125 MG PO TABS: 1.0000 | ORAL_TABLET | Freq: Two times a day (BID) | ORAL | 0 refills | Status: AC

## 2021-06-18 NOTE — Progress Notes (Signed)
We are sorry that you are not feeling well.  Here is how we plan to help!  Based on what you have shared with me it looks like you have Acute Infectious Diarrhea.  Most cases of acute diarrhea are due to infections with virus and bacteria and are self-limited conditions lasting less than 14 days.  However, based on what you shared with me and your history of diverticulitis, I am concerned this is a flare.  I will prescribed an antibiotic and recommend a bland/ liquid diet until symptoms have improved/ resolved.  Please follow up with PCP in 1-3 days for recheck.  If symptoms do not improve or worsen over the course of the day call 911, or go to the emergency room immediately.    HOME CARE We recommend changing your diet to help with your symptoms for the next few days. Drink plenty of fluids that contain water salt and sugar. Sports drinks such as Gatorade may help.  You may try broths, soups, bananas, applesauce, soft breads, mashed potatoes or crackers.  You are considered infectious for as long as the diarrhea continues. Hand washing or use of alcohol based hand sanitizers is recommend. It is best to stay out of work or school until your symptoms stop.   GET HELP RIGHT AWAY If you have dark yellow colored urine or do not pass urine frequently you should drink more fluids.   If your symptoms worsen  If you feel like you are going to pass out (faint) You have a new problem  MAKE SURE YOU  Understand these instructions. Will watch your condition. Will get help right away if you are not doing well or get worse.  Thank you for choosing an e-visit.  Your e-visit answers were reviewed by a board certified advanced clinical practitioner to complete your personal care plan. Depending upon the condition, your plan could have included both over the counter or prescription medications.  Please review your pharmacy choice. Make sure the pharmacy is open so you can pick up prescription now. If  there is a problem, you may contact your provider through [employer] and have the prescription routed to another pharmacy.  Your safety is important to [place]. If you have drug allergies check your prescription carefully.   For the next 24 hours you can use MyChart to ask questions about today's visit, request a non-urgent call back, or ask for a work or school excuse. You will get an email in the next two days asking about your experience. I hope that your e-visit has been valuable and will speed your recovery.

## 2021-06-18 NOTE — Progress Notes (Signed)
I have spent 5 minutes in review of e-visit questionnaire, review and updating patient chart, medical decision making and response to patient.   Alley Neils, PA-C    

## 2021-07-06 ENCOUNTER — Telehealth: Payer: Self-pay | Admitting: Orthopaedic Surgery

## 2021-07-06 NOTE — Telephone Encounter (Signed)
yes

## 2021-07-06 NOTE — Telephone Encounter (Signed)
Patient is requesting restrictions note to permanently work from home due to her condition. If okay, please provide note. Thanks

## 2021-07-07 NOTE — Telephone Encounter (Signed)
Note made. Sent through [employer].

## 2021-09-09 ENCOUNTER — Telehealth: Payer: BC Managed Care – PPO | Admitting: Family Medicine

## 2021-09-09 DIAGNOSIS — N3000 Acute cystitis without hematuria: Secondary | ICD-10-CM | POA: Diagnosis not present

## 2021-09-09 MED ORDER — NITROFURANTOIN MONOHYD MACRO 100 MG PO CAPS: 100.0000 mg | ORAL_CAPSULE | Freq: Two times a day (BID) | ORAL | 0 refills | Status: AC

## 2021-09-09 MED ORDER — PHENAZOPYRIDINE HCL 100 MG PO TABS: 100.0000 mg | ORAL_TABLET | Freq: Three times a day (TID) | ORAL | 0 refills | Status: AC | PRN

## 2021-09-09 NOTE — Progress Notes (Signed)
E-Visit for Urinary Problems  We are sorry that you are not feeling well.  Here is how we plan to help!  Based on what you shared with me it looks like you most likely have a simple urinary tract infection.  A UTI (Urinary Tract Infection) is a bacterial infection of the bladder.  Most cases of urinary tract infections are simple to treat but a key part of your care is to encourage you to drink plenty of fluids and watch your symptoms carefully.  I have prescribed MacroBid 100 mg twice a day for 5 days.  Your symptoms should gradually improve. Call us if the burning in your urine worsens, you develop worsening fever, back pain or pelvic pain or if your symptoms do not resolve after completing the antibiotic.  Urinary tract infections can be prevented by drinking plenty of water to keep your body hydrated.  Also be sure when you wipe, wipe from front to back and don't hold it in!  If possible, empty your bladder every 4 hours.  HOME CARE Drink plenty of fluids Compete the full course of the antibiotics even if the symptoms resolve Remember, when you need to go.go. Holding in your urine can increase the likelihood of getting a UTI! GET HELP RIGHT AWAY IF: You cannot urinate You get a high fever Worsening back pain occurs You see blood in your urine You feel sick to your stomach or throw up You feel like you are going to pass out  MAKE SURE YOU  Understand these instructions. Will watch your condition. Will get help right away if you are not doing well or get worse.   Thank you for choosing an e-visit.  Your e-visit answers were reviewed by a board certified advanced clinical practitioner to complete your personal care plan. Depending upon the condition, your plan could have included both over the counter or prescription medications.  Please review your pharmacy choice. Make sure the pharmacy is open so you can pick up prescription now. If there is a problem, you may contact your  provider through MyChart messaging and have the prescription routed to another pharmacy.  Your safety is important to us. If you have drug allergies check your prescription carefully.   For the next 24 hours you can use MyChart to ask questions about today's visit, request a non-urgent call back, or ask for a work or school excuse. You will get an email in the next two days asking about your experience. I hope that your e-visit has been valuable and will speed your recovery.   I have provided 5 minutes of non face to face time during this encounter for chart review and documentation.   

## 2021-09-22 ENCOUNTER — Telehealth: Payer: BC Managed Care – PPO | Admitting: Physician Assistant

## 2021-09-22 DIAGNOSIS — J019 Acute sinusitis, unspecified: Secondary | ICD-10-CM

## 2021-09-22 DIAGNOSIS — B9789 Other viral agents as the cause of diseases classified elsewhere: Secondary | ICD-10-CM | POA: Diagnosis not present

## 2021-09-22 MED ORDER — IPRATROPIUM BROMIDE 0.03 % NA SOLN
2.0000 | Freq: Two times a day (BID) | NASAL | 0 refills | Status: DC
Start: 2021-09-22 — End: 2021-12-08

## 2021-09-22 NOTE — Progress Notes (Signed)
E-Visit for Sinus Problems ? ?We are sorry that you are not feeling well.  Here is how we plan to help! ? ?Based on what you have shared with me it looks like you have sinusitis.  Sinusitis is inflammation and infection in the sinus cavities of the head.  Based on your presentation I believe you most likely have Acute Viral Sinusitis.This is an infection most likely caused by a virus. There is not specific treatment for viral sinusitis other than to help you with the symptoms until the infection runs its course.  You may use an oral decongestant such as Mucinex D or if you have glaucoma or high blood pressure use plain Mucinex. Saline nasal spray help and can safely be used as often as needed for congestion, I have prescribed: Ipratropium Bromide nasal spray 0.03% 2 sprays in eah nostril 2-3 times a day; can use with Flonase. ? ?Some authorities believe that zinc sprays or the use of Echinacea may shorten the course of your symptoms. ? ?Sinus infections are not as easily transmitted as other respiratory infection, however we still recommend that you avoid close contact with loved ones, especially the very young and elderly.  Remember to wash your hands thoroughly throughout the day as this is the number one way to prevent the spread of infection! ? ?Home Care: ?Only take medications as instructed by your medical team. ?Do not take these medications with alcohol. ?A steam or ultrasonic humidifier can help congestion.  You can place a towel over your head and breathe in the steam from hot water coming from a faucet. ?Avoid close contacts especially the very young and the elderly. ?Cover your mouth when you cough or sneeze. ?Always remember to wash your hands. ? ?Get Help Right Away If: ?You develop worsening fever or sinus pain. ?You develop a severe head ache or visual changes. ?Your symptoms persist after you have completed your treatment plan. ? ?Make sure you ?Understand these instructions. ?Will watch your  condition. ?Will get help right away if you are not doing well or get worse. ? ? ?Thank you for choosing an e-visit. ? ?Your e-visit answers were reviewed by a board certified advanced clinical practitioner to complete your personal care plan. Depending upon the condition, your plan could have included both over the counter or prescription medications. ? ?Please review your pharmacy choice. Make sure the pharmacy is open so you can pick up prescription now. If there is a problem, you may contact your provider through [employer] and have the prescription routed to another pharmacy.  Your safety is important to [place]. If you have drug allergies check your prescription carefully.  ? ?For the next 24 hours you can use MyChart to ask questions about today's visit, request a non-urgent call back, or ask for a work or school excuse. ?You will get an email in the next two days asking about your experience. I hope that your e-visit has been valuable and will speed your recovery. ? ?I provided 5 minutes of non face-to-face time during this encounter for chart review and documentation.  ? ?

## 2021-10-09 ENCOUNTER — Encounter: Payer: Self-pay | Admitting: Cardiovascular Disease

## 2021-12-07 NOTE — Progress Notes (Unsigned)
Office Visit Note   Patient: Angelica Sharp           Date of Birth: 01-15-1966           MRN: [id number] Visit Date: 12/08/2021              Requested by: Barnetta Chapel, NP [address] [place],  [place] [address] PCP: Barnetta Chapel, NP   Assessment & Plan: Visit Diagnoses:  1. Chronic left shoulder pain     Plan: Impression is anterior left shoulder pain.  I think this is biceps tendinitis which seems to have started after beginning an exercise program.  Recommend relative rest activity modification prednisone Dosepak followed by 2 weeks of scheduled ibuprofen.  Follow-up if symptoms persist.  May need to consider cortisone injection at that time.  Follow-Up Instructions: No follow-ups on file.   Orders:  Orders Placed This Encounter  Procedures   XR Shoulder Left   Meds ordered this encounter  Medications   predniSONE (STERAPRED UNI-PAK 21 TAB) 10 MG (21) TBPK tablet    Sig: Take as directed    Dispense:  21 tablet    Refill:  3   ibuprofen (ADVIL) 800 MG tablet    Sig: Take 1 tablet (800 mg total) by mouth every 8 (eight) hours as needed.    Dispense:  30 tablet    Refill:  2      Procedures: No procedures performed   Clinical Data: No additional findings.   Subjective: Chief Complaint  Patient presents with   Left Shoulder - Pain    HPI  Review of Systems  Constitutional: Negative.   HENT: Negative.    Eyes: Negative.   Respiratory: Negative.    Cardiovascular: Negative.   Endocrine: Negative.   Musculoskeletal: Negative.   Neurological: Negative.   Hematological: Negative.   Psychiatric/Behavioral: Negative.    All other systems reviewed and are negative.    Objective: Vital Signs: Ht '5\' 7"'$  (1.702 m)   Wt 180 lb (81.6 kg)   LMP 08/10/2012 Comment: ovaries remain//a.c.  BMI 28.19 kg/m   Physical Exam Vitals and nursing note reviewed.  Constitutional:      Appearance: She is well-developed.  HENT:     Head: Atraumatic.      Nose: Nose normal.  Eyes:     Extraocular Movements: Extraocular movements intact.  Cardiovascular:     Pulses: Normal pulses.  Pulmonary:     Effort: Pulmonary effort is normal.  Abdominal:     Palpations: Abdomen is soft.  Musculoskeletal:     Cervical back: Neck supple.  Skin:    General: Skin is warm.     Capillary Refill: Capillary refill takes less than 2 seconds.  Neurological:     Mental Status: She is alert and oriented to person, place, and time. Mental status is at baseline.  Psychiatric:        Behavior: Behavior normal.        Thought Content: Thought content normal.        Judgment: Judgment normal.    Ortho Exam Examination of the left shoulder shows pain to the anterior shoulder with reaching to the side.  Rotator cuff is strong with manual muscle testing.  Slight pain with impingement.  Range of motion is normal.  No cervical spine symptoms. Specialty Comments:  No specialty comments available.  Imaging: No results found.   PMFS History: Patient Active Problem List   Diagnosis Date Noted   Family  history of heart disease 12/09/2019   Palpitations 12/09/2019   Sliding hiatal hernia 12/02/2019   Status post total replacement of left hip 11/10/2018   Primary osteoarthritis of left hip 08/22/2018   Essential hypertension 11/25/2017   Foot pain 11/23/2017   Chronic joint pain 03/29/2015   DM II (diabetes mellitus, type II), controlled ([place]) 10/01/2014   Obesity 03/12/2014   Diverticular disease of colon 09/11/2013   Hyperlipidemia 09/10/2013   Family history of cancer 08/30/2012   GERD (gastroesophageal reflux disease) 07/03/2011   Dyspnea on exertion 01/24/2010   Atypical chest pain 01/24/2010   Past Medical History:  Diagnosis Date   Allergy    Anemia    Anxiety    Arthritis    Borderline hypertension    Chest pain    Complication of anesthesia    Degenerative lumbar disc    L5S1   Diabetes mellitus without complication (HCC)     Diverticulitis    Dyspnea    Edema    lower extremity   Family history of anesthesia complication    sister had BP complications during anes.   Fatty liver    GERD (gastroesophageal reflux disease)    H/O hiatal hernia    Headache    HLD (hyperlipidemia)    Hypertension    Pneumonia    PONV (postoperative nausea and vomiting)    SOB (shortness of breath)     Family History  Problem Relation Age of Onset   Heart attack Mother    Diabetes Mother    Hyperlipidemia Mother    Hypertension Mother    Stroke Sister    Pulmonary embolism Father    Clotting disorder Father    Diabetes Sister    Kidney disease Sister        kidney failure   Heart disease Sister    Cancer Sister        abdominal   Diabetes Son 27       TYPE 1   Kidney cancer Sister    Crohn's disease Niece    Cancer Nephew        gallbladder   Stomach cancer Neg Hx    Rectal cancer Neg Hx    Esophageal cancer Neg Hx    Colon cancer Neg Hx     Past Surgical History:  Procedure Laterality Date   BREAST LUMPECTOMY     4 lumps -- two from right -- one from left   LAPAROSCOPIC ASSISTED VAGINAL HYSTERECTOMY N/A 09/18/2012   Procedure: LAPAROSCOPIC ASSISTED VAGINAL HYSTERECTOMY;  Surgeon: Linda Hedges, DO;  Location: [facility];  Service: Gynecology;  Laterality: N/A;  BLADDER  DISTENTION   TOTAL HIP ARTHROPLASTY Left 11/10/2018   Procedure: LEFT TOTAL HIP ARTHROPLASTY ANTERIOR APPROACH;  Surgeon: Leandrew Koyanagi, MD;  Location: [facility];  Service: Orthopedics;  Laterality: Left;   TUBAL LIGATION     Social History   Occupational History   Occupation: Librarian, academic: Administrator, sports    Comment: [employer] - now works from home  Tobacco Use   Smoking status: Never   Smokeless tobacco: Never  Vaping Use   Vaping Use: Never used  Substance and Sexual Activity   Alcohol use: No   Drug use: No   Sexual activity: Not Currently

## 2021-12-08 ENCOUNTER — Ambulatory Visit: Payer: BC Managed Care – PPO | Admitting: Orthopaedic Surgery

## 2021-12-08 ENCOUNTER — Ambulatory Visit (INDEPENDENT_AMBULATORY_CARE_PROVIDER_SITE_OTHER): Payer: BC Managed Care – PPO

## 2021-12-08 ENCOUNTER — Encounter: Payer: Self-pay | Admitting: Orthopaedic Surgery

## 2021-12-08 VITALS — Ht 67.0 in | Wt 180.0 lb

## 2021-12-08 DIAGNOSIS — G8929 Other chronic pain: Secondary | ICD-10-CM | POA: Diagnosis not present

## 2021-12-08 DIAGNOSIS — M25512 Pain in left shoulder: Secondary | ICD-10-CM

## 2021-12-08 MED ORDER — PREDNISONE 10 MG (21) PO TBPK: ORAL_TABLET | ORAL | 3 refills | Status: DC

## 2021-12-08 MED ORDER — IBUPROFEN 800 MG PO TABS: 800.0000 mg | ORAL_TABLET | Freq: Three times a day (TID) | ORAL | 2 refills | Status: DC | PRN

## 2022-01-17 ENCOUNTER — Encounter: Payer: Self-pay | Admitting: Orthopaedic Surgery

## 2022-01-18 ENCOUNTER — Other Ambulatory Visit: Payer: Self-pay | Admitting: Physician Assistant

## 2022-01-18 MED ORDER — METHOCARBAMOL 750 MG PO TABS: 750.0000 mg | ORAL_TABLET | Freq: Two times a day (BID) | ORAL | 2 refills | Status: DC | PRN

## 2022-01-18 NOTE — Telephone Encounter (Signed)
I sent in robaxin (muscle relaxer to try).  For the shoulder pain, we can have her come in for repeat eval or go ahead and refer to Dr. Sammuel Hines for cortisone injection.  For her back I would have her follow up with Dr. Lorin Mercy or Louanne Skye

## 2022-01-23 ENCOUNTER — Encounter: Payer: Self-pay | Admitting: Orthopaedic Surgery

## 2022-01-29 ENCOUNTER — Other Ambulatory Visit: Payer: Self-pay

## 2022-01-29 DIAGNOSIS — G8929 Other chronic pain: Secondary | ICD-10-CM

## 2022-01-30 ENCOUNTER — Ambulatory Visit: Payer: BC Managed Care – PPO | Admitting: Sports Medicine

## 2022-01-30 ENCOUNTER — Encounter: Payer: Self-pay | Admitting: Sports Medicine

## 2022-01-30 ENCOUNTER — Ambulatory Visit: Payer: Self-pay

## 2022-01-30 DIAGNOSIS — G8929 Other chronic pain: Secondary | ICD-10-CM

## 2022-01-30 DIAGNOSIS — M25512 Pain in left shoulder: Secondary | ICD-10-CM | POA: Diagnosis not present

## 2022-01-30 NOTE — Progress Notes (Signed)
Xu patient. Here for left shoulder injection.

## 2022-01-30 NOTE — Progress Notes (Signed)
   Procedure Note  Patient: Angelica Sharp             Date of Birth: [birth year]           MRN: [id number]             Visit Date: 01/30/2022  Procedures: Visit Diagnoses:  1. Chronic left shoulder pain     Subjective: Angelica Sharp is a pleasant 56 year-old female who presents for left shoulder pain - sent by Dr. Erlinda Hong for US-guided GHJ injection of the left shoulder. Reports pain is moreso in anterior aspect of shoulder. She is hoping for good relief. She is a Type II DM, last A1c was 6.2.   Procedure: US-guided glenohumeral joint injection, left shoulder After informed verbal consent and discussion on risks/benefits/indications was given, a timeout was performed, patient was lying lateral recumbent on exam table. The patient's left shoulder was prepped with multiple alcohol swabs and utilizing ultrasound guidance, the patient's glenohumeral joint was identified on ultrasound. Using ultrasound guidance a 22-gauge, 3.5 inch needle with a mixture of 4:1cc's lidocaine:depomedrol was directed from a lateral to medial direction via in-plane technique into the glenohumeral joint with visualization of appropriate spread of injectate into the joint. Patient tolerated the procedure well without immediate complications.     - Evaluated patient about 10 minutes after injection and she reported quite significant improvement in her pain and ROM - Patient will follow-up with Dr. Erlinda Hong as indicated; I am happy to see her back as needed   Elba Barman, [place]   This note was dictated using Dragon naturally speaking software and may contain errors in syntax, spelling, or content which have not been identified prior to signing this note.

## 2022-02-06 ENCOUNTER — Other Ambulatory Visit: Payer: Self-pay

## 2022-02-06 DIAGNOSIS — M545 Low back pain, unspecified: Secondary | ICD-10-CM

## 2022-02-20 ENCOUNTER — Ambulatory Visit: Payer: BC Managed Care – PPO | Admitting: Physical Medicine and Rehabilitation

## 2022-02-20 ENCOUNTER — Encounter: Payer: Self-pay | Admitting: Physical Medicine and Rehabilitation

## 2022-02-20 DIAGNOSIS — M7918 Myalgia, other site: Secondary | ICD-10-CM

## 2022-02-20 DIAGNOSIS — M545 Low back pain, unspecified: Secondary | ICD-10-CM | POA: Diagnosis not present

## 2022-02-20 DIAGNOSIS — M47816 Spondylosis without myelopathy or radiculopathy, lumbar region: Secondary | ICD-10-CM | POA: Diagnosis not present

## 2022-02-20 DIAGNOSIS — G8929 Other chronic pain: Secondary | ICD-10-CM

## 2022-02-20 NOTE — Progress Notes (Unsigned)
Angelica Sharp - 56 y.o. female MRN [id number]  Date of birth: [birth year]  Office Visit Note: Visit Date: 02/20/2022 PCP: Barnetta Chapel, NP Referred by: Barnetta Chapel, NP  Subjective: Chief Complaint  Patient presents with   Lower Back - Pain   HPI: Angelica Sharp is a 56 y.o. female who comes in today per the request of Dr. Eduard Roux for evaluation of chronic, worsening and severe bilateral lower back pain.  Pain ongoing for several years and worsens with movement, activity, standing and walking.  She describes pain as spasms, currently rates as 7 out of 10. States her back feels sore most days especially when performing tasks/participating in activities.  Some relief of pain with home exercise regimen, rest and use of over-the-counter medications and OTC lidocaine patches. No relief of pain with recent muscle relaxer medications. Recent lumbar MRI imaging exhibits mild degenerative changes of the lumbar spine, no stenosis noted. There is shallow central disc protrusion at L5-S1. Patient has no history of lumbar surgery/injection. Patient underwent left total hip arthroplasty anterior approach by Dr. Erlinda Hong on 11/09/2021. Was recently seen by Dr. Elba Barman in our office for left glenohumeral joint injection. Patient denies focal weakness, numbness and tingling. Patient denies recent trauma or falls.    Review of Systems  Musculoskeletal:  Positive for back pain and myalgias.  Neurological:  Negative for tingling, sensory change, focal weakness and weakness.  All other systems reviewed and are negative.  Otherwise per HPI.  Assessment & Plan: Visit Diagnoses:    ICD-10-CM   1. Chronic bilateral low back pain without sciatica  M54.50 Ambulatory referral to Physical Therapy   G89.29     2. Spondylosis without myelopathy or radiculopathy, lumbar region  M47.816     3. Facet arthropathy, lumbar  M47.816     4. Myofascial pain syndrome  M79.18 Ambulatory referral to Physical Therapy        Plan: Findings:  Chronic, worsening and severe bilateral axial back pain. No radicular symptoms. Patient continues to have severe pain despite good conservative therapies such as home exercise regimen, rest and use of medications. I did review recent lumbar MRI with patient today using images and spine model, minimal degenerative changes noted, lumbar MRI imaging looks good for her age. Patients clinical presentation and exam are consistent with myofascial pain syndrome, multiple palpable trigger points noted to bilateral lumbar paraspinal region upon exam today. Next step is to place order for formal physical therapy with a focus on manual treatments and dry needling. If per pain persists post PT we could consider performing diagnostic and hopefully therapeutic lumbar epidural steroid injection. Patient encouraged to remain active and use Tylenol 500 mg TID as needed. She can also continue with OTC lidocaine patches as needed. No red flag symptoms noted upon exam today.     Meds & Orders: No orders of the defined types were placed in this encounter.   Orders Placed This Encounter  Procedures   Ambulatory referral to Physical Therapy    Follow-up: Return if symptoms worsen or fail to improve.   Procedures: No procedures performed      Clinical History: MRI LUMBAR SPINE WITHOUT CONTRAST   TECHNIQUE: Multiplanar, multisequence MR imaging of the lumbar spine was performed. No intravenous contrast was administered.   COMPARISON:  X-ray lumbar 03/31/2021.   FINDINGS: Segmentation:  Standard.   Alignment:  Physiologic.   Vertebrae: No fracture. No evidence of discitis. No suspicious bone lesion. There is a 2.4  cm intraosseous hemangioma within the L3 vertebral body. There are a few additional smaller intraosseous hemangiomas present.   Conus medullaris and cauda equina: Conus extends to the L2 level. Conus and cauda equina appear normal.   Paraspinal and other soft tissues: Left  renal cyst. No acute findings.   Disc levels:   T12-L1: Tiny shallow disc protrusion. No foraminal or canal stenosis.   L1-L2: Unremarkable.   L2-L3: Unremarkable.   L3-L4: Unremarkable disc. Mild bilateral facet hypertrophy. No foraminal or canal stenosis.   L4-L5: Unremarkable disc. Mild bilateral facet hypertrophy. No foraminal or canal stenosis.   L5-S1: Shallow central disc protrusion. Mild bilateral facet hypertrophy. No foraminal or canal stenosis.   IMPRESSION: Mild degenerative changes of the lumbar spine without foraminal or canal stenosis at any level.     Electronically Signed   By: Davina Poke D.O.   On: 05/02/2021 10:26   She reports that she has never smoked. She has never used smokeless tobacco. No results for input(s): "HGBA1C", "LABURIC" in the last 8760 hours.  Objective:  VS:  HT:    WT:   BMI:     BP:   HR: bpm  TEMP: ( )  RESP:  Physical Exam Vitals and nursing note reviewed.  HENT:     Head: Normocephalic and atraumatic.     Right Ear: External ear normal.     Left Ear: External ear normal.     Nose: Nose normal.     Mouth/Throat:     Mouth: Mucous membranes are moist.  Eyes:     Extraocular Movements: Extraocular movements intact.  Cardiovascular:     Rate and Rhythm: Normal rate.     Pulses: Normal pulses.  Pulmonary:     Effort: Pulmonary effort is normal.  Abdominal:     General: Abdomen is flat. There is no distension.  Musculoskeletal:        General: Tenderness present.     Cervical back: Normal range of motion.     Comments: Pt rises from seated position to standing without difficulty. Good lumbar range of motion. Strong distal strength without clonus, no pain upon palpation of greater trochanters. Sensation intact bilaterally. Tenderness and palpable trigger points noted to bilateral lumbar paraspinal region. Walks independently, gait steady.   Skin:    General: Skin is warm and dry.     Capillary Refill: Capillary  refill takes less than 2 seconds.  Neurological:     General: No focal deficit present.     Mental Status: She is alert and oriented to person, place, and time.  Psychiatric:        Mood and Affect: Mood normal.        Behavior: Behavior normal.     Ortho Exam  Imaging: No results found.  Past Medical/Family/Surgical/Social History: Medications & Allergies reviewed per EMR, new medications updated. 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   Primary osteoarthritis of left hip 08/22/2018   Essential hypertension 11/25/2017   Foot pain 11/23/2017   Chronic joint pain 03/29/2015   DM II (diabetes mellitus, type II), controlled ([place]) 10/01/2014   Obesity 03/12/2014   Diverticular disease of colon 09/11/2013   Hyperlipidemia 09/10/2013   Family history of cancer 08/30/2012   GERD (gastroesophageal reflux disease) 07/03/2011   Dyspnea on exertion 01/24/2010  Atypical chest pain 01/24/2010   Past Medical History:  Diagnosis Date   Allergy    Anemia    Anxiety    Arthritis    Borderline hypertension    Chest pain    Complication of anesthesia    Degenerative lumbar disc    L5S1   Diabetes mellitus without complication (HCC)    Diverticulitis    Dyspnea    Edema    lower extremity   Family history of anesthesia complication    sister had BP complications during anes.   Fatty liver    GERD (gastroesophageal reflux disease)    H/O hiatal hernia    Headache    HLD (hyperlipidemia)    Hypertension    Pneumonia    PONV (postoperative nausea and vomiting)    SOB (shortness of breath)    Family History  Problem Relation Age of Onset   Heart attack Mother    Diabetes Mother    Hyperlipidemia Mother    Hypertension Mother    Stroke Sister    Pulmonary embolism Father    Clotting disorder Father     Diabetes Sister    Kidney disease Sister        kidney failure   Heart disease Sister    Cancer Sister        abdominal   Diabetes Son 27       TYPE 1   Kidney cancer Sister    Crohn's disease Niece    Cancer Nephew        gallbladder   Stomach cancer Neg Hx    Rectal cancer Neg Hx    Esophageal cancer Neg Hx    Colon cancer Neg Hx    Past Surgical History:  Procedure Laterality Date   BREAST LUMPECTOMY     4 lumps -- two from right -- one from left   LAPAROSCOPIC ASSISTED VAGINAL HYSTERECTOMY N/A 09/18/2012   Procedure: LAPAROSCOPIC ASSISTED VAGINAL HYSTERECTOMY;  Surgeon: Linda Hedges, DO;  Location: [facility];  Service: Gynecology;  Laterality: N/A;  BLADDER  DISTENTION   TOTAL HIP ARTHROPLASTY Left 11/10/2018   Procedure: LEFT TOTAL HIP ARTHROPLASTY ANTERIOR APPROACH;  Surgeon: Leandrew Koyanagi, MD;  Location: [facility];  Service: Orthopedics;  Laterality: Left;   TUBAL LIGATION     Social History   Occupational History   Occupation: Librarian, academic: Administrator, sports    Comment: [employer] - now works from home  Tobacco Use   Smoking status: Never   Smokeless tobacco: Never  Vaping Use   Vaping Use: Never used  Substance and Sexual Activity   Alcohol use: No   Drug use: No   Sexual activity: Not Currently

## 2022-02-20 NOTE — Progress Notes (Signed)
Low back pain, denies radiculopathy Discuss if injection would be a good step Lumbar MRI 04/2021

## 2022-03-21 ENCOUNTER — Telehealth: Payer: Self-pay | Admitting: Sports Medicine

## 2022-03-21 NOTE — Telephone Encounter (Signed)
Pt called and states her shoulder injection helped about 50% and now the pain is coming back like before.Wondering if we can try the other spot?   Cb [phone]

## 2022-03-21 NOTE — Telephone Encounter (Signed)
Also pt asked me to refax referral from [facility] for Physical therapy. Done at 9:07 on 03/21/22.

## 2022-03-22 ENCOUNTER — Telehealth: Payer: BC Managed Care – PPO | Admitting: Physician Assistant

## 2022-03-22 DIAGNOSIS — R3989 Other symptoms and signs involving the genitourinary system: Secondary | ICD-10-CM

## 2022-03-22 MED ORDER — CEPHALEXIN 500 MG PO CAPS: 500.0000 mg | ORAL_CAPSULE | Freq: Two times a day (BID) | ORAL | 0 refills | Status: DC

## 2022-03-22 NOTE — Progress Notes (Signed)

## 2022-04-02 ENCOUNTER — Ambulatory Visit: Payer: BC Managed Care – PPO | Admitting: Sports Medicine

## 2022-04-02 ENCOUNTER — Encounter: Payer: Self-pay | Admitting: Sports Medicine

## 2022-04-02 ENCOUNTER — Ambulatory Visit: Payer: Self-pay

## 2022-04-02 VITALS — BP 128/78 | HR 86

## 2022-04-02 DIAGNOSIS — G8929 Other chronic pain: Secondary | ICD-10-CM

## 2022-04-02 DIAGNOSIS — M25512 Pain in left shoulder: Secondary | ICD-10-CM | POA: Diagnosis not present

## 2022-04-02 MED ORDER — LIDOCAINE HCL 1 % IJ SOLN
2.0000 mL | INTRAMUSCULAR | Status: AC | PRN
  Administered 2022-04-02: 2 mL

## 2022-04-02 MED ORDER — BUPIVACAINE HCL 0.25 % IJ SOLN
2.0000 mL | INTRAMUSCULAR | Status: AC | PRN
  Administered 2022-04-02: 2 mL via INTRA_ARTICULAR

## 2022-04-02 MED ORDER — METHYLPREDNISOLONE ACETATE 40 MG/ML IJ SUSP
40.0000 mg | INTRAMUSCULAR | Status: AC | PRN
  Administered 2022-04-02: 40 mg via INTRA_ARTICULAR

## 2022-04-02 NOTE — Progress Notes (Signed)
   Procedure Note  Patient: Angelica Sharp             Date of Birth: 1966/02/04           MRN: [id number]             Visit Date: 04/02/2022  Procedures: Visit Diagnoses:  1. Chronic left shoulder pain     Large Joint Inj: L glenohumeral on 04/02/2022 2:36 PM Indications: pain Details: 22 G 3.5 in needle, ultrasound-guided posterior approach Medications: 2 mL lidocaine 1 %; 2 mL bupivacaine 0.25 %; 40 mg methylPREDNISolone acetate 40 MG/ML Outcome: tolerated well, no immediate complications  US-guided glenohumeral joint injection, Left shoulder After discussion on risks/benefits/indications, informed verbal consent was obtained. A timeout was then performed. The patient was positioned lying lateral recumbent on examination table. The patient's shoulder was prepped with betadine and multiple alcohol swabs and utilizing ultrasound guidance, the patient's glenohumeral joint was identified on ultrasound. Using ultrasound guidance a 22-gauge, 3.5 inch needle with a mixture of 2:2:1 cc's lidocaine:bupivicaine:depomedrol was directed from a lateral to medial direction via in-plane technique into the glenohumeral joint with visualization of appropriate spread of injectate into the joint. Patient tolerated the procedure well without immediate complications.      Procedure, treatment alternatives, risks and benefits explained, specific risks discussed. Consent was given by the patient. Immediately prior to procedure a time out was called to verify the correct patient, procedure, equipment, support staff and site/side marked as required. Patient was prepped and draped in the usual sterile fashion.     - I evaluated the patient about 10 minutes post-injection and she had some improvement in pain and range of motion, but still pain with IR -I did discuss with Angelica Sharp that if she does not get full relief after the second injection, we may want to consider further work-up such as MRI of the shoulder.   She will let us know how this helps improve her pain.  May consider subacromial joint injection if this does not help fully. - Follow-up with Dr. Erlinda Hong as desires  Elba Barman, DO Primary Care Sports Medicine Physician  [place]  This note was dictated using Dragon naturally speaking software and may contain errors in syntax, spelling, or content which have not been identified prior to signing this note.

## 2022-04-04 ENCOUNTER — Encounter: Payer: Self-pay | Admitting: Sports Medicine

## 2022-04-04 ENCOUNTER — Telehealth: Payer: BC Managed Care – PPO | Admitting: Family

## 2022-04-04 DIAGNOSIS — R399 Unspecified symptoms and signs involving the genitourinary system: Secondary | ICD-10-CM

## 2022-04-04 NOTE — Progress Notes (Signed)
Given your symptoms did not resolve with the Keflex, you need to be seen for a urine culture. Your condition warrants further evaluation and I recommend that you be seen in a face to face visit.   NOTE: There will be NO CHARGE for this eVisit   If you are having a true medical emergency please call 911.      For an urgent face to face visit, [facility] has seven urgent care centers for your convenience:     [facility] [place] [facility] [place] Get Driving Directions [phone] [place], [place] [address]    [facility] [place] [facility]) Get Driving Directions [phone] [place], [place] [address]  [facility] [place] ([place]) Get Driving Directions [phone] [address] [place],  [place]  [address]  [facility] [place] Get Driving Directions  [phone] [address] [place],  [place] [address]   [facility] Get Driving Directions [phone] [place], [place], [place] [address]   [facility] Get Driving Directions  [phone] [address] [place], [place] [address]   [facility] [place] Get Driving Directions [phone] [address], [place],  [address]  Your MyChart E-visit questionnaire answers were reviewed by a board certified advanced clinical practitioner to complete your personal care plan based on your specific symptoms.  Thank you for using e-Visits.

## 2022-04-05 ENCOUNTER — Telehealth: Payer: BC Managed Care – PPO | Admitting: Nurse Practitioner

## 2022-04-05 DIAGNOSIS — R399 Unspecified symptoms and signs involving the genitourinary system: Secondary | ICD-10-CM

## 2022-04-05 NOTE — Progress Notes (Signed)
Based on what you shared with me it looks like you have unresolved UTI,that should be evaluated in a face to face office visit. SInce you were treated with Keflex with no resolution, you will need a Urinalysis and urine culture for proper treatment.  NOTE: There will be NO CHARGE for this eVisit   If you are having a true medical emergency please call 911.      For an urgent face to face visit, [facility] has six urgent care centers for your convenience:     [facility] [place] [facility] [place] Get Driving Directions [phone] [place], [place] [address]    [facility]) Get Driving Directions [phone] [place], [place] [address]  [facility] [place] [facility]) Get Driving Directions [phone] [address] [place],  [place]  [address]  [facility] Get Driving Directions [phone] [address] [place], [place], [place] [address]   [facility] Get Driving Directions  [phone] [address] [place], [place] [address]   [facility] [place] Get Driving Directions [phone] [address], [place], [place] [address]  Your MyChart E-visit questionnaire answers were reviewed by a board certified advanced clinical practitioner to complete your personal care plan based on your specific symptoms.  Thank you for using e-Visits.

## 2022-04-08 ENCOUNTER — Other Ambulatory Visit: Payer: Self-pay | Admitting: Family

## 2022-04-12 ENCOUNTER — Encounter: Payer: Self-pay | Admitting: Sports Medicine

## 2022-04-17 ENCOUNTER — Ambulatory Visit: Payer: BC Managed Care – PPO | Admitting: Sports Medicine

## 2022-04-17 ENCOUNTER — Ambulatory Visit: Payer: Self-pay

## 2022-04-17 ENCOUNTER — Encounter: Payer: Self-pay | Admitting: Sports Medicine

## 2022-04-17 DIAGNOSIS — M25512 Pain in left shoulder: Secondary | ICD-10-CM | POA: Diagnosis not present

## 2022-04-17 DIAGNOSIS — G8929 Other chronic pain: Secondary | ICD-10-CM

## 2022-04-17 MED ORDER — LIDOCAINE HCL 1 % IJ SOLN
0.5000 mL | INTRAMUSCULAR | Status: AC | PRN
  Administered 2022-04-17: .5 mL

## 2022-04-17 MED ORDER — METHYLPREDNISOLONE ACETATE 40 MG/ML IJ SUSP
40.0000 mg | INTRAMUSCULAR | Status: AC | PRN
  Administered 2022-04-17: 40 mg via INTRA_ARTICULAR

## 2022-04-17 NOTE — Patient Instructions (Addendum)
Prabhjot - good to see you again today. I hope this does the trick for your shoulder.  -We did perform an [facility] joint injection today under ultrasound.  You may have some soreness from the injection over the next 1-2 days, you may ice and take Tylenol or ibuprofen until then the medicine kicks in, which should help to be over the next 2 to 3 days.  -For the next 2 days, try to avoid any repetitive or overhead activity, no lifting heavy objects  -I would give this 10-14 days to fully kick in and at that point you may message or call me to give me an update on what type of relief.  If for some reason, you do not get at least 50 to 75% relief, our next steps would likely be obtaining MRI of the shoulder.   If you have any further questions, please give the clinic a call [phone].  Elba Barman, DO Primary Care Sports Medicine Physician  [place]

## 2022-04-17 NOTE — Progress Notes (Signed)
Tamarra Geiselman - 56 y.o. female MRN [id number]  Date of birth: 02/16/1966  Office Visit Note: Visit Date: 04/17/2022 PCP: Barnetta Chapel, NP Referred by: Barnetta Chapel, NP  Subjective: Chief Complaint  Patient presents with   Left Shoulder - Follow-up   HPI: Mahrosh Donnell is a pleasant 56 y.o. female who presents today for follow-up of ongoing left chronic shoulder pain.  She continues with anterior left shoulder pain.  Pain localized over the [facility] joint.  Initially this was thought to be biceps related, as previously seen by Dr. Erlinda Hong and team.  I have performed 2 ultrasound-guided glenohumeral joint injections, the first 1 gave her least 50% improvement, the second 1 did not help much.  She presents for reevaluation.  She has been using the home exercises for the shoulder.  Pain bothers her with certain reaching or overhead activity.  Pain radiates up the clavicle and into the neck at times.  Also radiates into the deltoid and upper arm.  There is no numbness or tingling.  No pain that goes into the hands or fingers.  Pertinent ROS were reviewed with the patient and found to be negative unless otherwise specified above in HPI.   Assessment & Plan: Visit Diagnoses:  1. Chronic left shoulder pain   2. Pain in left acromioclavicular joint    Plan: Had a good discussion with Ashlon today regarding her shoulder pain.  She did get 50% relief from the first glenohumeral joint injection, however the second 1 did not help much at all.  Her exam today is actually more indicative of the [facility] joint related pain.  We discussed all treatment options, through shared decision making elected to proceed with ultrasound-guided [facility] joint injection for both diagnostic and hopefully therapeutic purposes.  After 2 days of relative rest, she may start back on the shoulder rehab exercises.  She will let me know in the coming 10-14 days what sort of pain relief she gets.  If she has excellent relief, we will continue  conservative treatment.  If for some reason she is not getting at least 50-75% improvement, we will likely proceed with MRI of the left shoulder to better quantify the origin of her pain.  Follow-up: Return for Will call/message me to see how she's doing.   Meds & Orders: No orders of the defined types were placed in this encounter.   Orders Placed This Encounter  Procedures   Medium Joint Inj: L acromioclavicular   US Guided Needle Placement - No Linked Charges     Procedures: Medium Joint Inj: L acromioclavicular on 04/17/2022 4:37 PM Indications: pain Details: 25 G 1.5 in needle, ultrasound-guided anterior approach Medications: 0.5 mL lidocaine 1 %; 40 mg methylPREDNISolone acetate 40 MG/ML  US-guided AC Joint injection, Left shoulder After discussion on risks/benefits/indications, informed verbal consent was obtained. A timeout was then performed. The patient was seated in examination room. The area overlying the [facility] joint of the shoulder was prepped with Betadine and alcohol swab then utilizing ultrasound guidance, patient's AC joint was injected using a 25G, 1.5" needle with 0.5:1.69m lidocaine:depomedrol of injectate via an out-of-plane, walk-down approach. Visualization of injectate flow was noted under ultrasound guidance--this was seen in-plane and out-of-plane views. Patient tolerated the procedure well without immediate complications.   Procedure, treatment alternatives, risks and benefits explained, specific risks discussed. Consent was given by the patient. Immediately prior to procedure a time out was called to verify the correct patient, procedure, equipment, support staff and site/side  marked as required. Patient was prepped and draped in the usual sterile fashion.          Clinical History: MRI LUMBAR SPINE WITHOUT CONTRAST   TECHNIQUE: Multiplanar, multisequence MR imaging of the lumbar spine was performed. No intravenous contrast was administered.   COMPARISON:   X-ray lumbar 03/31/2021.   FINDINGS: Segmentation:  Standard.   Alignment:  Physiologic.   Vertebrae: No fracture. No evidence of discitis. No suspicious bone lesion. 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 There are a few additional smaller intraosseous hemangiomas present.   Conus medullaris and cauda equina: Conus extends to the L2 level. Conus and cauda equina appear normal.   Paraspinal and other soft tissues: Left renal cyst. No acute findings.   Disc levels:   T12-L1: Tiny shallow disc protrusion. No foraminal or canal stenosis.   L1-L2: Unremarkable.   L2-L3: Unremarkable.   L3-L4: Unremarkable disc. Mild bilateral facet hypertrophy. No foraminal or canal stenosis.   L4-L5: Unremarkable disc. Mild bilateral facet hypertrophy. No foraminal or canal stenosis.   L5-S1: Shallow central disc protrusion. Mild bilateral facet hypertrophy. No foraminal or canal stenosis.   IMPRESSION: Mild degenerative changes of the lumbar spine without foraminal or canal stenosis at any level.     Electronically Signed   By: Davina Poke D.O.   On: 05/02/2021 10:26  She reports that she has never smoked. She has never used smokeless tobacco. No results for input(s): "HGBA1C", "LABURIC" in the last 8760 hours.  Objective:   Vital Signs: LMP 08/10/2012 Comment: ovaries remain//a.c.  Physical Exam  Gen: Well-appearing, in no acute distress; non-toxic CV: Regular Rate. Well-perfused. Warm.  Resp: Breathing unlabored on room air; no wheezing. Psych: Fluid speech in conversation; appropriate affect; normal thought process Neuro: Sensation intact throughout. No gross coordination deficits.   Ortho Exam - Left shoulder: + TTP noted over the [facility] joint.  No overlying skin changes, erythema or ecchymosis.  There is full active and passive range of motion of the shoulder in all directions.  There are some mild anterior pain with thumb to T12 and  internal rotation.  Strength 5/5 throughout, rotator cuff testing intact.  Peripheral pulses intact.  No painful arc.  Negative empty can and internal/external rotation. + Some pain with crossarm abduction as well as Gerber liftoff and bearhugger test.  Negative speeds testing.  Imaging: No results found.  Past Medical/Family/Surgical/Social History: Medications & Allergies reviewed per EMR, new medications updated. 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   Primary osteoarthritis of left hip 08/22/2018   Essential hypertension 11/25/2017   Foot pain 11/23/2017   Chronic joint pain 03/29/2015   DM II (diabetes mellitus, type II), controlled ([place]) 10/01/2014   Obesity 03/12/2014   Diverticular disease of colon 09/11/2013   Hyperlipidemia 09/10/2013   Family history of cancer 08/30/2012   GERD (gastroesophageal reflux disease) 07/03/2011   Dyspnea on exertion 01/24/2010   Atypical chest pain 01/24/2010   Past Medical History:  Diagnosis Date   Allergy    Anemia    Anxiety    Arthritis    Borderline hypertension    Chest pain    Complication of anesthesia    Degenerative lumbar disc    L5S1   Diabetes mellitus without complication ([place])    Diverticulitis  Dyspnea    Edema    lower extremity   Family history of anesthesia complication    sister had BP complications during anes.   Fatty liver    GERD (gastroesophageal reflux disease)    H/O hiatal hernia    Headache    HLD (hyperlipidemia)    Hypertension    Pneumonia    PONV (postoperative nausea and vomiting)    SOB (shortness of breath)    Family History  Problem Relation Age of Onset   Heart attack Mother    Diabetes Mother    Hyperlipidemia Mother    Hypertension Mother    Stroke Sister    Pulmonary embolism Father    Clotting disorder  Father    Diabetes Sister    Kidney disease Sister        kidney failure   Heart disease Sister    Cancer Sister        abdominal   Diabetes Son 27       TYPE 1   Kidney cancer Sister    Crohn's disease Niece    Cancer Nephew        gallbladder   Stomach cancer Neg Hx    Rectal cancer Neg Hx    Esophageal cancer Neg Hx    Colon cancer Neg Hx    Past Surgical History:  Procedure Laterality Date   BREAST LUMPECTOMY     4 lumps -- two from right -- one from left   LAPAROSCOPIC ASSISTED VAGINAL HYSTERECTOMY N/A 09/18/2012   Procedure: LAPAROSCOPIC ASSISTED VAGINAL HYSTERECTOMY;  Surgeon: Linda Hedges, DO;  Location: [facility];  Service: Gynecology;  Laterality: N/A;  BLADDER  DISTENTION   TOTAL HIP ARTHROPLASTY Left 11/10/2018   Procedure: LEFT TOTAL HIP ARTHROPLASTY ANTERIOR APPROACH;  Surgeon: Leandrew Koyanagi, MD;  Location: [facility];  Service: Orthopedics;  Laterality: Left;   TUBAL LIGATION     Social History   Occupational History   Occupation: Librarian, academic: Administrator, sports    Comment: [employer] - now works from home  Tobacco Use   Smoking status: Never   Smokeless tobacco: Never  Vaping Use   Vaping Use: Never used  Substance and Sexual Activity   Alcohol use: No   Drug use: No   Sexual activity: Not Currently

## 2022-05-21 ENCOUNTER — Encounter: Payer: Self-pay | Admitting: Orthopaedic Surgery

## 2022-05-22 NOTE — Telephone Encounter (Signed)
Thanks

## 2022-05-22 NOTE — Telephone Encounter (Signed)
Approve on the letter.  We can just do the same as before.  Thanks.

## 2022-05-28 NOTE — Telephone Encounter (Signed)
Yes approve

## 2022-08-29 ENCOUNTER — Encounter: Payer: Self-pay | Admitting: Cardiovascular Disease

## 2022-08-29 NOTE — Telephone Encounter (Signed)
Call to patient . She states Pain mid to left side of chest.  Pain between shoulder blades.  Non radiating. No dizziness, lightheadedness, nor headache.  No SOB other than her normal.  No jaw pain o.  Patient states the chest pain is during the day and does not wake her at night.  States the shoulder blade painwakes her up at night.  Slept on a heating pad and took muscle relaxe\r last night with no relief.  She states she had an Echo in the past and that it stated "mild dilation and no issues because it was at 38"  States was told at 43 she would need to be sent to  a Psychologist, sport and exercise.  She was told that increased blood pressure could cause increased dilation.  Her BO has been elevated with reports of 140's at her last OV visit with other provider and HR in the 90's.  She has taken her BP twice today  144/81  HR 84,  BP 139/82  HR 78.  She is concerned this may be occurring and if she should have a sooner ECHO.  Please advise

## 2022-08-29 NOTE — Telephone Encounter (Signed)
Per Dr Gwenlyn Found :  Coronary calcium score was 0.  Follow-up with APP in the next 2 or 3 weeks.

## 2022-09-21 ENCOUNTER — Ambulatory Visit: Payer: No Typology Code available for payment source | Attending: Nurse Practitioner | Admitting: Nurse Practitioner

## 2022-09-21 ENCOUNTER — Encounter: Payer: Self-pay | Admitting: Nurse Practitioner

## 2022-09-21 VITALS — BP 122/76 | HR 84 | Ht 67.0 in | Wt 198.6 lb

## 2022-09-21 DIAGNOSIS — R072 Precordial pain: Secondary | ICD-10-CM

## 2022-09-21 DIAGNOSIS — I77810 Thoracic aortic ectasia: Secondary | ICD-10-CM

## 2022-09-21 DIAGNOSIS — Z01812 Encounter for preprocedural laboratory examination: Secondary | ICD-10-CM

## 2022-09-21 DIAGNOSIS — I10 Essential (primary) hypertension: Secondary | ICD-10-CM

## 2022-09-21 DIAGNOSIS — R002 Palpitations: Secondary | ICD-10-CM | POA: Diagnosis not present

## 2022-09-21 DIAGNOSIS — R0609 Other forms of dyspnea: Secondary | ICD-10-CM

## 2022-09-21 DIAGNOSIS — R0789 Other chest pain: Secondary | ICD-10-CM | POA: Diagnosis not present

## 2022-09-21 DIAGNOSIS — E785 Hyperlipidemia, unspecified: Secondary | ICD-10-CM

## 2022-09-21 DIAGNOSIS — E119 Type 2 diabetes mellitus without complications: Secondary | ICD-10-CM

## 2022-09-21 MED ORDER — METOPROLOL TARTRATE 100 MG PO TABS
ORAL_TABLET | ORAL | 0 refills | Status: DC
Start: 2022-09-21 — End: 2022-11-07

## 2022-09-21 NOTE — Patient Instructions (Addendum)
Medication Instructions:   TAKE Metoprolol Tartrate (Lopressor) 100 mg 2 hours prior to Coronary CTA  *If you need a refill on your cardiac medications before your next appointment, please call your pharmacy*   Lab Work: Your physician recommends that you return for lab work 1 week prior to Coronary CTA:  BMP  If you have labs (blood work) drawn today and your tests are completely normal, you will receive your results only by: MyChart Message (if you have MyChart) OR A paper copy in the mail If you have any lab test that is abnormal or we need to change your treatment, we will call you to review the results.  Testing/Procedures: Your physician has requested that you have an echocardiogram. Echocardiography is a painless test that uses sound waves to create images of your heart. It provides your doctor with information about the size and shape of your heart and how well your heart's chambers and valves are working. This procedure takes approximately one hour. There are no restrictions for this procedure. Please do NOT wear cologne, perfume, aftershave, or lotions (deodorant is allowed). Please arrive 15 minutes prior to your appointment time.  Your physician has requested that you have cardiac CT. Cardiac CT Angiography (CTA), is a special type of CT scan that uses a computer to produce multi-dimensional views of major blood vessels throughout the body. In CT angiography, a contrast material is injected through an IV to help visualize the blood vessels. A cardiac CT angiogram is a procedure to look at the heart and the area around the heart. It may be done to help find the cause of chest pains or other symptoms of heart disease. During this procedure, a substance called contrast dye is injected into a vein in the arm. The contrast highlights the blood vessels in the area to be checked. A large X-ray machine (CT scanner), then takes detailed pictures of the heart and the surrounding area. The  procedure is also sometimes called a coronary CT angiogram, coronary artery scanning, or CTA.  Follow-Up: At [facility], you and your health needs are our priority.  As part of our continuing mission to provide you with exceptional heart care, we have created designated Provider Care Teams.  These Care Teams include your primary Cardiologist (physician) and Advanced Practice Providers (APPs -  Physician Assistants and Nurse Practitioners) who all work together to provide you with the care you need, when you need it.  Your next appointment:   6-8 week(s)  Provider:   Nanetta Batty, MD  or APP on day Dr. Allyson Sabal is in office   Other Instructions     Your cardiac CT will be scheduled at one of the below locations:   [facility] [address] [place], [place] [address] [phone]  OR  [facility] [address] [place], [place] [address] [phone]  OR   [facility] [address] [place], [place] [address] [phone]  If scheduled at [facility], please arrive at the [facility] and Children's Entrance (Entrance C2) of [facility] 30 minutes prior to test start time. You can use the FREE valet parking offered at entrance C (encouraged to control the heart rate for the test)  Proceed to the [facility] Radiology Department (first floor) to check-in and test prep.  All radiology patients and guests should use entrance C2 at [facility], accessed from [facility], even though the hospital's  physical address listed is [address].    If scheduled at [facility] or [facility], please arrive 15 mins early for check-in and test prep.   Please follow these instructions carefully (unless otherwise directed):  On the Night Before the Test: Be sure to Drink plenty of water. Do not consume  any caffeinated/decaffeinated beverages or chocolate 12 hours prior to your test. Do not take any antihistamines 12 hours prior to your test. If the patient has contrast allergy: Patient will need a prescription for Prednisone and very clear instructions (as follows): Prednisone 50 mg - take 13 hours prior to test Take another Prednisone 50 mg 7 hours prior to test Take another Prednisone 50 mg 1 hour prior to test Take Benadryl 50 mg 1 hour prior to test Patient must complete all four doses of above prophylactic medications. Patient will need a ride after test due to Benadryl.  On the Day of the Test: Drink plenty of water until 1 hour prior to the test. Do not eat any food 1 hour prior to test. You may take your regular medications prior to the test.  Take metoprolol (Lopressor) two hours prior to test. If you take Furosemide/Hydrochlorothiazide/Spironolactone, please HOLD on the morning of the test. FEMALES- please wear underwire-free bra if available, avoid dresses & tight clothing       After the Test: Drink plenty of water. After receiving IV contrast, you may experience a mild flushed feeling. This is normal. On occasion, you may experience a mild rash up to 24 hours after the test. This is not dangerous. If this occurs, you can take Benadryl 25 mg and increase your fluid intake. If you experience trouble breathing, this can be serious. If it is severe call 911 IMMEDIATELY. If it is mild, please call our office. If you take any of these medications: Glipizide/Metformin, Avandament, Glucavance, please do not take 48 hours after completing test unless otherwise instructed.  We will call to schedule your test 2-4 weeks out understanding that some insurance companies will need an authorization prior to the service being performed.   For non-scheduling related questions, please contact the cardiac imaging nurse navigator should you have any questions/concerns: Rockwell [place], Cardiac  Imaging Nurse Navigator Larey Brick, Cardiac Imaging Nurse Navigator [facility] Direct Office Dial: [phone]   For scheduling needs, including cancellations and rescheduling, please call [place], [phone].

## 2022-09-21 NOTE — Progress Notes (Signed)
Office Visit    Patient Name: Angelica Sharp Date of Encounter: 09/21/2022  Primary Care Provider:  Alveria Apley, NP (Inactive) Primary Cardiologist:  Nanetta Batty, MD  Chief Complaint    57 year old female with a history of atypical chest pain, palpitations, mild dilation of ascending aorta, hypertension, hyperlipidemia, type 2 diabetes, and GERD who presents for follow-up related to chest pain and hypertension.  Past Medical History    Past Medical History:  Diagnosis Date   Allergy    Anemia    Anxiety    Arthritis    Borderline hypertension    Chest pain    Complication of anesthesia    Degenerative lumbar disc    L5S1   Diabetes mellitus without complication    Diverticulitis    Dyspnea    Edema    lower extremity   Family history of anesthesia complication    sister had BP complications during anes.   Fatty liver    GERD (gastroesophageal reflux disease)    H/O hiatal hernia    Headache    HLD (hyperlipidemia)    Hypertension    Pneumonia    PONV (postoperative nausea and vomiting)    SOB (shortness of breath)    Past Surgical History:  Procedure Laterality Date   BREAST LUMPECTOMY     4 lumps -- two from right -- one from left   LAPAROSCOPIC ASSISTED VAGINAL HYSTERECTOMY N/A 09/18/2012   Procedure: LAPAROSCOPIC ASSISTED VAGINAL HYSTERECTOMY;  Surgeon: Mitchel Honour, DO;  Location: [facility];  Service: Gynecology;  Laterality: N/A;  BLADDER  DISTENTION   TOTAL HIP ARTHROPLASTY Left 11/10/2018   Procedure: LEFT TOTAL HIP ARTHROPLASTY ANTERIOR APPROACH;  Surgeon: Tarry Kos, MD;  Location: [facility];  Service: Orthopedics;  Laterality: Left;   TUBAL LIGATION      Allergies  Allergies  Allergen Reactions   Sulfa Antibiotics Hives and Other (See Comments)    Heart Rate goes up   Morphine And Related Other (See Comments)    Headache and hallucinations   Septra [Sulfamethoxazole-Trimethoprim] Other (See Comments)    TACHYCARDIA     Labs/Other  Studies Reviewed    The following studies were reviewed today: Echo 01/2021: IMPRESSIONS     1. Left ventricular ejection fraction, by estimation, is 55 to 60%. The  left ventricle has normal function. The left ventricle has no regional  wall motion abnormalities. Left ventricular diastolic parameters were  normal. The average left ventricular  global longitudinal strain is -23.3 %. The global longitudinal strain is  normal.   2. Right ventricular systolic function is normal. The right ventricular  size is normal.   3. The mitral valve is normal in structure. Trivial mitral valve  regurgitation. No evidence of mitral stenosis.   4. The aortic valve is tricuspid. There is mild calcification of the  aortic valve. Aortic valve regurgitation is not visualized. Mild aortic  valve sclerosis is present, with no evidence of aortic valve stenosis.   5. Aortic dilatation noted. There is mild dilatation of the ascending  aorta, measuring 40 mm.   6. The inferior vena cava is normal in size with greater than 50%  respiratory variability, suggesting right atrial pressure of 3 mmHg.   Comparison(s): No significant change from prior study.   Recent Labs: No results found for requested labs within last 365 days.  Recent Lipid Panel    Component Value Date/Time   CHOL 190 08/31/2019 1447   TRIG 307 (H) 08/31/2019 1447  HDL 42 08/31/2019 1447   CHOLHDL 4.5 (H) 08/31/2019 1447   CHOLHDL 6.9 (H) 03/29/2015 1249   VLDL 49 (H) 03/29/2015 1249   LDLCALC 96 08/31/2019 1447    History of Present Illness    57 year old female with the above past medical history including atypical chest pain, palpitations, mild dilation of ascending aorta, hypertension, hyperlipidemia, type 2 diabetes, and GERD.  She was initially evaluated by Dr. Allyson Sabal in 2021 the setting of chest pain, palpitations. Echocardiogram at the time was essentially normal but did show mild dilation of the ascending aorta measuring 40  mm.  Coronary calcium score was 0, no evidence of CAD.  Cardiac monitor in 12/2019 showed occasional PVCs, and couplets with short runs of SVT although which resolved with iron repletion.  Most recent echocardiogram in 01/2021 showed EF 55 to 60%, no significant valvular abnormalities, mild dilation of the ascending aorta measuring 40 mm, unchanged from prior study. She was last seen in the office on 02/08/2021 and was stable from a cardiac standpoint.  She denied any chest pain or shortness of breath.  She contacted our office on 08/29/2022 with complaints of left-sided chest pain, pain between her shoulder blades.   She presents today for follow-up.  Since her last visit and since she contacted our office she notes a several week history of pain between her shoulders, worse at night, and "a whole lot" of shortness of breath.  She saw her PCP who drew labs-her iron and magnesium were supplemented (results not available for review). She denies chest pain, palpitations, edema, PND, orthopnea, weight gain.  She is however, very concerned that her symptoms are coming from her heart.   Home Medications    Current Outpatient Medications  Medication Sig Dispense Refill   Calcium Carb-Cholecalciferol (CALCIUM+D3 PO) Take 1 tablet by mouth daily.     Ferrous Sulfate (IRON PO) Take by mouth.     glucose blood test strip Use as instructed 100 each 3   hydrochlorothiazide (HYDRODIURIL) 25 MG tablet Take 1 tablet by mouth once daily 90 tablet 0   loperamide (IMODIUM A-D) 2 MG tablet Take 4 mg by mouth 4 (four) times daily as needed for diarrhea or loose stools.      magnesium oxide (MAG-OX) 400 (240 Mg) MG tablet Take 400 mg by mouth daily.     Melatonin 5 MG CAPS Take 1 capsule by mouth at bedtime.     metFORMIN (GLUCOPHAGE) 500 MG tablet Take 1 tablet by mouth in the morning, at noon, and at bedtime.     methocarbamol (ROBAXIN-750) 750 MG tablet Take 1 tablet (750 mg total) by mouth 2 (two) times daily as needed  for muscle spasms. 20 tablet 2   polyethylene glycol (MIRALAX / GLYCOLAX) packet Take 17 g by mouth daily as needed for mild constipation.      Potassium 99 MG TABS Take 99-198 mg by mouth See admin instructions. Take 198 mg in the morning and 99 mg at night     pravastatin (PRAVACHOL) 40 MG tablet TAKE 1 TABLET BY MOUTH AT BEDTIME 90 tablet 0   predniSONE (STERAPRED UNI-PAK 21 TAB) 10 MG (21) TBPK tablet Take as directed 21 tablet 3   cephALEXin (KEFLEX) 500 MG capsule Take 1 capsule (500 mg total) by mouth 2 (two) times daily. (Patient not taking: Reported on 09/21/2022) 14 capsule 0   ibuprofen (ADVIL) 800 MG tablet Take 1 tablet (800 mg total) by mouth every 8 (eight) hours as needed. (Patient  not taking: Reported on 09/21/2022) 30 tablet 2   No current facility-administered medications for this visit.     Review of Systems    She denies chest pain, palpitations, pnd, orthopnea, n, v, dizziness, syncope, edema, weight gain, or early satiety. All other systems reviewed and are otherwise negative except as noted above.   Physical Exam    VS:  BP 122/76   Pulse 84   Ht  (1.702 m)   Wt 198 lb 9.6 oz (90.1 kg)   LMP 08/10/2012 Comment: ovaries remain//a.c.  SpO2 97%   BMI 31.11 kg/m  GEN: Well nourished, well developed, in no acute distress. HEENT: normal. Neck: Supple, no JVD, carotid bruits, or masses. Cardiac: RRR, no murmurs, rubs, or gallops. No clubbing, cyanosis, edema.  Radials/DP/PT 2+ and equal bilaterally.  Respiratory:  Respirations regular and unlabored, clear to auscultation bilaterally. GI: Soft, nontender, nondistended, BS + x 4. MS: no deformity or atrophy. Skin: warm and dry, no rash. Neuro:  Strength and sensation are intact. Psych: Normal affect.  Accessory Clinical Findings    ECG personally reviewed by me today -EKG in office today.  EKG reviewed from recent visit with PCP, NSR, 85 bpm, LAD- no acute changes.   Lab Results  Component Value Date   WBC  8.6 03/04/2020   HGB 13.6 03/04/2020   HCT 44.7 03/04/2020   MCV 77 (L) 03/04/2020   PLT 260.0 01/20/2020   Lab Results  Component Value Date   CREATININE 0.91 03/04/2020   BUN 11 03/04/2020   NA 145 (H) 03/04/2020   K 4.4 03/04/2020   CL 106 03/04/2020   CO2 21 03/04/2020   Lab Results  Component Value Date   ALT 33 (H) 03/04/2020   AST 30 03/04/2020   ALKPHOS 98 03/04/2020   BILITOT <0.2 03/04/2020   Lab Results  Component Value Date   CHOL 190 08/31/2019   HDL 42 08/31/2019   LDLCALC 96 08/31/2019   TRIG 307 (H) 08/31/2019   CHOLHDL 4.5 (H) 08/31/2019    Lab Results  Component Value Date   HGBA1C 7.2 (A) 11/24/2019    Assessment & Plan    1. Atypical chest pain: She has a history of atypical chest pain.  Coronary calcium score in 2021 was 0, no evidence of CAD.  She now notes a several week history of back pain/20 her shoulder blades that is worse at night.  She also notes shortness of breath.  She has a history of anemia, recently had labs drawn with PCP and apparently her iron and magnesium were low and supplemented (will request most recent labs from PCP).  She denies chest pain.  She is concerned that her symptoms are coming from her heart.  Repeat echo, will check coronary CT angiogram, even though prior calcium score reassuring.  Discussed ED precautions.  Will check BMET prior to coronary CT angiogram.  Will give metoprolol 100 mg 2 hours prior to CT.  2. Palpitations: Cardiac monitor in 12/2019 showed occasional PVCs, and couplets with short runs of SVT although which resolved with iron repletion.  Denies any recent palpitations.  3. Mild dilation of ascending aorta: Most recent echo in 01/2021 showed EF 55 to 60%, no significant valvular abnormalities, mild dilation of the ascending aorta measuring 40 mm, unchanged from prior study.  Concerned that her recent symptoms are coming from her heart and/or dilated aorta. Discussed ED precautions.  Repeat echo/CT pending  as above.  4. Hypertension: BP well controlled.  Continue current antihypertensive regimen.   5. Hyperlipidemia: No recent LDL on file.  Will request most recent labs from PCP.  Continue pravastatin.  6. Type 2 diabetes: No recent A1c on file.  She mentioned a desire to lose weight is asking about weight loss medication and possible cardiac side effects.  She may be a good candidate for GLP-1 receptor agonist.  I advised her to discuss this with her PCP.   7. Disposition: Follow-up in 6-8 weeks.      Joylene Grapes, NP 09/21/2022, 2:51 PM

## 2022-09-26 LAB — BASIC METABOLIC PANEL
BUN/Creatinine Ratio: 10 (ref 9–23)
BUN: 8 mg/dL (ref 6–24)
CO2: 23 mmol/L (ref 20–29)
Calcium: 9.2 mg/dL (ref 8.7–10.2)
Chloride: 98 mmol/L (ref 96–106)
Creatinine, Ser: 0.84 mg/dL (ref 0.57–1.00)
Glucose: 99 mg/dL (ref 70–99)
Potassium: 4.2 mmol/L (ref 3.5–5.2)
Sodium: 140 mmol/L (ref 134–144)
eGFR: 80 mL/min/{1.73_m2} (ref >59)

## 2022-09-28 ENCOUNTER — Other Ambulatory Visit: Payer: Self-pay

## 2022-09-28 ENCOUNTER — Emergency Department (HOSPITAL_BASED_OUTPATIENT_CLINIC_OR_DEPARTMENT_OTHER): Payer: No Typology Code available for payment source

## 2022-09-28 ENCOUNTER — Encounter (HOSPITAL_BASED_OUTPATIENT_CLINIC_OR_DEPARTMENT_OTHER): Payer: Self-pay

## 2022-09-28 ENCOUNTER — Telehealth (HOSPITAL_COMMUNITY): Payer: Self-pay | Admitting: *Deleted

## 2022-09-28 ENCOUNTER — Emergency Department (HOSPITAL_BASED_OUTPATIENT_CLINIC_OR_DEPARTMENT_OTHER)
Admission: EM | Admit: 2022-09-28 | Discharge: 2022-09-28 | Disposition: A | Discharge status: Home / Self Care | Payer: No Typology Code available for payment source | Attending: Emergency Medicine | Admitting: Emergency Medicine

## 2022-09-28 DIAGNOSIS — D72829 Elevated white blood cell count, unspecified: Secondary | ICD-10-CM | POA: Insufficient documentation

## 2022-09-28 DIAGNOSIS — K5792 Diverticulitis of intestine, part unspecified, without perforation or abscess without bleeding: Secondary | ICD-10-CM

## 2022-09-28 DIAGNOSIS — K5732 Diverticulitis of large intestine without perforation or abscess without bleeding: Secondary | ICD-10-CM | POA: Diagnosis not present

## 2022-09-28 DIAGNOSIS — R1032 Left lower quadrant pain: Secondary | ICD-10-CM | POA: Diagnosis present

## 2022-09-28 LAB — CBC WITH DIFFERENTIAL/PLATELET
Abs Immature Granulocytes: 0.05 10*3/uL (ref 0.00–0.07)
Basophils Absolute: 0.1 10*3/uL (ref 0.0–0.1)
Basophils Relative: 1 %
Eosinophils Absolute: 0.1 10*3/uL (ref 0.0–0.5)
Eosinophils Relative: 1 %
HCT: 39.5 % (ref 36.0–46.0)
Hemoglobin: 12.8 g/dL (ref 12.0–15.0)
Immature Granulocytes: 0 %
Lymphocytes Relative: 13 %
Lymphs Abs: 1.6 10*3/uL (ref 0.7–4.0)
MCH: 27.2 pg (ref 26.0–34.0)
MCHC: 32.4 g/dL (ref 30.0–36.0)
MCV: 84 fL (ref 80.0–100.0)
Monocytes Absolute: 0.6 10*3/uL (ref 0.1–1.0)
Monocytes Relative: 5 %
Neutro Abs: 10.1 10*3/uL — ABNORMAL HIGH (ref 1.7–7.7)
Neutrophils Relative %: 80 %
Platelets: 263 10*3/uL (ref 150–400)
RBC: 4.7 MIL/uL (ref 3.87–5.11)
RDW: 13.4 % (ref 11.5–15.5)
WBC: 12.4 10*3/uL — ABNORMAL HIGH (ref 4.0–10.5)
nRBC: 0 % (ref 0.0–0.2)

## 2022-09-28 LAB — COMPREHENSIVE METABOLIC PANEL
ALT: 19 U/L (ref 0–44)
AST: 23 U/L (ref 15–41)
Albumin: 3.9 g/dL (ref 3.5–5.0)
Alkaline Phosphatase: 71 U/L (ref 38–126)
Anion gap: 11 (ref 5–15)
BUN: 9 mg/dL (ref 6–20)
CO2: 22 mmol/L (ref 22–32)
Calcium: 8.8 mg/dL — ABNORMAL LOW (ref 8.9–10.3)
Chloride: 103 mmol/L (ref 98–111)
Creatinine, Ser: 0.97 mg/dL (ref 0.44–1.00)
GFR, Estimated: >60 mL/min (ref >60)
Glucose, Bld: 139 mg/dL — ABNORMAL HIGH (ref 70–99)
Potassium: 3.6 mmol/L (ref 3.5–5.1)
Sodium: 136 mmol/L (ref 135–145)
Total Bilirubin: 0.9 mg/dL (ref 0.3–1.2)
Total Protein: 7.3 g/dL (ref 6.5–8.1)

## 2022-09-28 LAB — URINALYSIS, ROUTINE W REFLEX MICROSCOPIC
Bilirubin Urine: NEGATIVE
Glucose, UA: NEGATIVE mg/dL
Hgb urine dipstick: NEGATIVE
Ketones, ur: NEGATIVE mg/dL
Nitrite: NEGATIVE
Protein, ur: NEGATIVE mg/dL
Specific Gravity, Urine: <=1.005 (ref 1.005–1.030)
pH: 6.5 (ref 5.0–8.0)

## 2022-09-28 LAB — URINALYSIS, MICROSCOPIC (REFLEX)

## 2022-09-28 LAB — LIPASE, BLOOD: Lipase: 25 U/L (ref 11–51)

## 2022-09-28 MED ORDER — IOHEXOL 300 MG/ML  SOLN
100.0000 mL | Freq: Once | INTRAMUSCULAR | Status: AC | PRN
  Administered 2022-09-28: 100 mL via INTRAVENOUS

## 2022-09-28 MED ORDER — FLUCONAZOLE 200 MG PO TABS: 200.0000 mg | ORAL_TABLET | Freq: Every day | ORAL | 0 refills | Status: DC | PRN

## 2022-09-28 MED ORDER — AMOXICILLIN-POT CLAVULANATE 875-125 MG PO TABS: 1.0000 | ORAL_TABLET | Freq: Two times a day (BID) | ORAL | 0 refills | Status: AC

## 2022-09-28 NOTE — ED Provider Notes (Signed)
[facility] Provider Note   CSN: [id number] Arrival date & time: 09/28/22  4098     History  Chief Complaint  Patient presents with   Abdominal Pain    Angelica Sharp is a 57 y.o. female presenting to the ED with left lower quadrant abdominal pain.  Onset about 2 days ago.  She says it feels like her prior diverticulitis in the past.  She reports a mild nausea.  Denies bloody bowel movement.  Denies other symptoms  HPI     Home Medications Prior to Admission medications   Medication Sig Start Date End Date Taking? Authorizing Provider  amoxicillin-clavulanate (AUGMENTIN) 875-125 MG tablet Take 1 tablet by mouth 2 (two) times daily for 7 days. 09/28/22 10/05/22 Yes Mabell Esguerra, Kermit Balo, MD  fluconazole (DIFLUCAN) 200 MG tablet Take 1 tablet (200 mg total) by mouth daily as needed for up to 1 dose. Take if you develop symptoms of vaginal yeast infection 09/28/22  Yes Graycee Greeson, Kermit Balo, MD  Calcium Carb-Cholecalciferol (CALCIUM+D3 PO) Take 1 tablet by mouth daily.    [provider]  cephALEXin (KEFLEX) 500 MG capsule Take 1 capsule (500 mg total) by mouth 2 (two) times daily. Patient not taking: Reported on 09/21/2022 03/22/22   Margaretann Loveless, PA-C  Ferrous Sulfate (IRON PO) Take by mouth.    [provider]  glucose blood test strip Use as instructed 05/10/15   Porfirio Oar, PA  hydrochlorothiazide (HYDRODIURIL) 25 MG tablet Take 1 tablet by mouth once daily 10/26/20   Shade Flood, MD  ibuprofen (ADVIL) 800 MG tablet Take 1 tablet (800 mg total) by mouth every 8 (eight) hours as needed. Patient not taking: Reported on 09/21/2022 12/08/21   Tarry Kos, MD  loperamide (IMODIUM A-D) 2 MG tablet Take 4 mg by mouth 4 (four) times daily as needed for diarrhea or loose stools.     [provider]  magnesium oxide (MAG-OX) 400 (240 Mg) MG tablet Take 400 mg by mouth daily.    [provider]   Melatonin 5 MG CAPS Take 1 capsule by mouth at bedtime.    [provider]  metFORMIN (GLUCOPHAGE) 500 MG tablet Take 1 tablet by mouth in the morning, at noon, and at bedtime. 12/24/18   [provider]  methocarbamol (ROBAXIN-750) 750 MG tablet Take 1 tablet (750 mg total) by mouth 2 (two) times daily as needed for muscle spasms. 01/18/22   Cristie Hem, PA-C  metoprolol tartrate (LOPRESSOR) 100 MG tablet Take tablet by mouth 2 hours prior to Coronary CTA 09/21/22   Joylene Grapes, NP  polyethylene glycol (MIRALAX / GLYCOLAX) packet Take 17 g by mouth daily as needed for mild constipation.     [provider]  Potassium 99 MG TABS Take 99-198 mg by mouth See admin instructions. Take 198 mg in the morning and 99 mg at night    [provider]  pravastatin (PRAVACHOL) 40 MG tablet TAKE 1 TABLET BY MOUTH AT BEDTIME 09/01/20   Janeece Agee, NP  predniSONE (STERAPRED UNI-PAK 21 TAB) 10 MG (21) TBPK tablet Take as directed 12/08/21   Tarry Kos, MD  calcium carbonate 200 MG capsule TWICE WEEKLY  08/29/11  [provider]  ranitidine (ZANTAC) 150 MG capsule Take 150 mg by mouth every evening.  08/29/11  [provider]      Allergies    Sulfa antibiotics, Morphine and related, and Septra [sulfamethoxazole-trimethoprim]  Review of Systems   Review of Systems  Physical Exam Updated Vital Signs BP (!) 143/72 (BP Location: Left Arm)   Pulse 80   Temp 98.5 F (36.9 C) (Oral)   Resp 18   Ht 5\' 7"  (1.702 m)   Wt 88.5 kg   LMP 08/10/2012 Comment: ovaries remain//a.c.  SpO2 98%   BMI 30.54 kg/m  Physical Exam Constitutional:      General: She is not in acute distress. HENT:     Head: Normocephalic and atraumatic.  Eyes:     Conjunctiva/sclera: Conjunctivae normal.     Pupils: Pupils are equal, round, and reactive to light.  Cardiovascular:     Rate and Rhythm: Normal rate and regular rhythm.  Pulmonary:     Effort: Pulmonary  effort is normal. No respiratory distress.  Abdominal:     General: There is no distension.     Tenderness: There is abdominal tenderness in the left lower quadrant.  Skin:    General: Skin is warm and dry.  Neurological:     General: No focal deficit present.     Mental Status: She is alert. Mental status is at baseline.  Psychiatric:        Mood and Affect: Mood normal.        Behavior: Behavior normal.     ED Results / Procedures / Treatments   Labs (all labs ordered are listed, but only abnormal results are displayed) Labs Reviewed  COMPREHENSIVE METABOLIC PANEL - Abnormal; Notable for the following components:      Result Value   Glucose, Bld 139 (*)    Calcium 8.8 (*)    All other components within normal limits  CBC WITH DIFFERENTIAL/PLATELET - Abnormal; Notable for the following components:   WBC 12.4 (*)    Neutro Abs 10.1 (*)    All other components within normal limits  LIPASE, BLOOD  URINALYSIS, ROUTINE W REFLEX MICROSCOPIC    EKG None  Radiology CT ABDOMEN PELVIS W CONTRAST  Result Date: 09/28/2022 CLINICAL DATA:  Left lower quadrant abdominal pain. Complication of diverticulitis suspected. Symptoms for 2 days. EXAM: CT ABDOMEN AND PELVIS WITH CONTRAST TECHNIQUE: Multidetector CT imaging of the abdomen and pelvis was performed using the standard protocol following bolus administration of intravenous contrast. RADIATION DOSE REDUCTION: This exam was performed according to the departmental dose-optimization program which includes automated exposure control, adjustment of the mA and/or kV according to patient size and/or use of iterative reconstruction technique. CONTRAST:  OMNIPAQUE IOHEXOL 300 MG/ML  SOLN COMPARISON:  Abdominopelvic CT 04/11/2021. FINDINGS: Lower chest: Clear lung bases. No significant pleural or pericardial effusion. Small hiatal hernia with distal esophageal wall thickening, similar to previous CT. Hepatobiliary: The liver is normal in  density without suspicious focal abnormality. There are unchanged low-density hepatic lesions which are likely cysts. No evidence of gallstones, gallbladder wall thickening or biliary dilatation. Pancreas: Unremarkable. No pancreatic ductal dilatation or surrounding inflammatory changes. Spleen: Normal in size without focal abnormality. Adrenals/Urinary Tract: Both adrenal glands appear normal. No evidence of urinary tract calculus, suspicious renal lesion or hydronephrosis. The bladder appears unremarkable for its degree of distention. Stomach/Bowel: No enteric contrast administered. The stomach appears unremarkable for its degree of distention. No small bowel distension, wall thickening or surrounding inflammation identified. The appendix appears normal. There are scattered colonic diverticula. There is an inflamed diverticulum anteriorly in the mid descending colon (image 55/2) with surrounding colonic wall thickening and inflammation. No extraluminal air or fluid collections are identified.  No evidence of bowel obstruction. Vascular/Lymphatic: There are no enlarged abdominal or pelvic lymph nodes. Minimal aortoiliac atherosclerosis without evidence of aneurysm or large vessel occlusion. The portal, superior mesenteric and splenic veins are patent. Reproductive: Status post hysterectomy.  No adnexal mass. Other: Intact abdominal wall. No ascites, free air or focal extraluminal fluid collection. Musculoskeletal: No acute or significant osseous findings. Prior left total hip arthroplasty. IMPRESSION: 1. Acute uncomplicated diverticulitis of the mid descending colon. No evidence of bowel perforation, abscess or obstruction. 2. Small hiatal hernia with distal esophageal wall thickening, similar to previous CT. 3.  Aortic Atherosclerosis (ICD10-I70.0). Electronically Signed   By: Carey Bullocks M.D.   On: 09/28/2022 11:32    Procedures Procedures    Medications Ordered in ED Medications  iohexol (OMNIPAQUE)  300 MG/ML solution 100 mL (100 mLs Intravenous Contrast Given 09/28/22 1044)    ED Course/ Medical Decision Making/ A&P                             Medical Decision Making Amount and/or Complexity of Data Reviewed Labs: ordered. Radiology: ordered.  Risk Prescription drug management.   This patient presents to the ED with concern for lower abdominal pain. This involves an extensive number of treatment options, and is a complaint that carries with it a high risk of complications and morbidity.  The differential diagnosis includes reticulitis versus colitis versus ureteral colic versus UTI versus other  Co-morbidities that complicate the patient evaluation: History of diverticulosis at high risk of diverticulitis   I ordered and personally interpreted labs.  The pertinent results include: Minor leukocytosis, no other emergent findings on blood tests  I ordered imaging studies including CT abdomen pelvis I independently visualized and interpreted imaging which showed acute uncomplicated diverticulitis I agree with the radiologist interpretation  The patient was maintained on a cardiac monitor.  I personally viewed and interpreted the cardiac monitored which showed an underlying rhythm of: Sinus rhythm and sinus tachycardia  I have reviewed the patients home medicines and have made adjustments as needed   After the interventions noted above, I reevaluated the patient and found that they have: stayed the same  Patient is intending to go directly to the pharmacy to pick up her antibiotic prescription.  She can follow-up as an outpatient for this.  She did have some tachycardia initially presenting to the ED with suspect this likely pain related, I have a lower suspicion for sepsis or bacteremia at this time.  No surgical emergency identified on CT imaging.  She is stable for discharge.  Dispostion:  After consideration of the diagnostic results and the patients response to treatment, I  feel that the patent would benefit from outpatient PCP follow-up..         Final Clinical Impression(s) / ED Diagnoses Final diagnoses:  Diverticulitis    Rx / DC Orders ED Discharge Orders          Ordered    amoxicillin-clavulanate (AUGMENTIN) 875-125 MG tablet  2 times daily        09/28/22 1144    fluconazole (DIFLUCAN) 200 MG tablet  Daily PRN        09/28/22 1144              Terald Sleeper, MD 09/28/22 1145

## 2022-09-28 NOTE — Telephone Encounter (Signed)
Attempted to call patient regarding upcoming cardiac CT appointment. °Left message on voicemail with name and callback number ° °Joesphine Schemm RN Navigator Cardiac Imaging °[facility] °[phone] Office °[phone] Cell ° °

## 2022-09-28 NOTE — ED Triage Notes (Signed)
Pt reports LLQ abdominal pain X2 days. Denies N&V. Pt states pain radiates to left back. Denies urinary symptoms.

## 2022-10-01 ENCOUNTER — Ambulatory Visit (HOSPITAL_COMMUNITY)
Admission: RE | Admit: 2022-10-01 | Discharge: 2022-10-01 | Disposition: A | Discharge status: Home / Self Care | Payer: No Typology Code available for payment source | Source: Ambulatory Visit | Attending: Nurse Practitioner | Admitting: Nurse Practitioner

## 2022-10-01 DIAGNOSIS — R0609 Other forms of dyspnea: Secondary | ICD-10-CM | POA: Diagnosis present

## 2022-10-01 DIAGNOSIS — R072 Precordial pain: Secondary | ICD-10-CM | POA: Insufficient documentation

## 2022-10-01 MED ORDER — METOPROLOL TARTRATE 5 MG/5ML IV SOLN
5.0000 mg | INTRAVENOUS | Status: DC | PRN
  Administered 2022-10-01 (×2): 5 mg via INTRAVENOUS

## 2022-10-01 MED ORDER — IOHEXOL 350 MG/ML SOLN
100.0000 mL | Freq: Once | INTRAVENOUS | Status: AC | PRN
  Administered 2022-10-01: 100 mL via INTRAVENOUS

## 2022-10-01 MED ORDER — NITROGLYCERIN 0.4 MG SL SUBL
0.8000 mg | SUBLINGUAL_TABLET | Freq: Once | SUBLINGUAL | Status: AC
  Administered 2022-10-01: 0.8 mg via SUBLINGUAL

## 2022-10-01 MED ORDER — SODIUM CHLORIDE 0.9 % IV BOLUS
500.0000 mL | Freq: Once | INTRAVENOUS | Status: AC
  Administered 2022-10-01: 500 mL via INTRAVENOUS

## 2022-10-01 MED ORDER — NITROGLYCERIN 0.4 MG SL SUBL
SUBLINGUAL_TABLET | SUBLINGUAL | Status: AC
  Filled 2022-10-01: qty 2

## 2022-10-01 MED ORDER — METOPROLOL TARTRATE 5 MG/5ML IV SOLN
INTRAVENOUS | Status: AC
  Filled 2022-10-01: qty 10

## 2022-10-10 ENCOUNTER — Telehealth: Payer: Self-pay

## 2022-10-10 NOTE — Telephone Encounter (Signed)
Spoke with pt. Pt was notified of coronary CTA results. Pt will continue her current medication and f/u as planned.

## 2022-10-13 ENCOUNTER — Telehealth: Payer: No Typology Code available for payment source | Admitting: Nurse Practitioner

## 2022-10-13 DIAGNOSIS — R399 Unspecified symptoms and signs involving the genitourinary system: Secondary | ICD-10-CM

## 2022-10-14 MED ORDER — NITROFURANTOIN MONOHYD MACRO 100 MG PO CAPS
100.0000 mg | ORAL_CAPSULE | Freq: Two times a day (BID) | ORAL | 0 refills | Status: AC
Start: 2022-10-14 — End: 2022-10-19

## 2022-10-14 NOTE — Progress Notes (Signed)
I have spent 5 minutes in review of e-visit questionnaire, review and updating patient chart, medical decision making and response to patient.  ° °Zorion Nims W Matson Welch, NP ° °  °

## 2022-10-14 NOTE — Progress Notes (Signed)

## 2022-10-16 MED ORDER — CEPHALEXIN 500 MG PO CAPS: 500.0000 mg | ORAL_CAPSULE | Freq: Two times a day (BID) | ORAL | 0 refills | Status: AC

## 2022-10-16 NOTE — Addendum Note (Signed)
Addended by: Freddy Finner on: 10/16/2022 08:59 AM   Modules accepted: Orders

## 2022-10-17 ENCOUNTER — Ambulatory Visit (HOSPITAL_COMMUNITY): Payer: No Typology Code available for payment source | Attending: Cardiology

## 2022-10-17 DIAGNOSIS — R0789 Other chest pain: Secondary | ICD-10-CM | POA: Diagnosis not present

## 2022-10-17 DIAGNOSIS — R0609 Other forms of dyspnea: Secondary | ICD-10-CM | POA: Insufficient documentation

## 2022-10-17 LAB — ECHOCARDIOGRAM COMPLETE
Area-P 1/2: 3.42 cm2
Calc EF: 53.7 %
S' Lateral: 3.2 cm
Single Plane A2C EF: 54.1 %
Single Plane A4C EF: 53.9 %

## 2022-10-22 ENCOUNTER — Telehealth: Payer: Self-pay

## 2022-10-22 NOTE — Telephone Encounter (Signed)
Spoke with pt. Pt was notified of echo results. Pt will continue her current medication and f/u as planned.  

## 2022-11-07 ENCOUNTER — Encounter: Payer: Self-pay | Admitting: Physician Assistant

## 2022-11-07 ENCOUNTER — Ambulatory Visit: Payer: No Typology Code available for payment source | Attending: Physician Assistant | Admitting: Physician Assistant

## 2022-11-07 VITALS — BP 124/86 | HR 85 | Ht 67.0 in | Wt 200.6 lb

## 2022-11-07 DIAGNOSIS — R0600 Dyspnea, unspecified: Secondary | ICD-10-CM

## 2022-11-07 DIAGNOSIS — Z7984 Long term (current) use of oral hypoglycemic drugs: Secondary | ICD-10-CM

## 2022-11-07 DIAGNOSIS — I10 Essential (primary) hypertension: Secondary | ICD-10-CM

## 2022-11-07 DIAGNOSIS — E785 Hyperlipidemia, unspecified: Secondary | ICD-10-CM | POA: Diagnosis not present

## 2022-11-07 DIAGNOSIS — E119 Type 2 diabetes mellitus without complications: Secondary | ICD-10-CM

## 2022-11-07 NOTE — Progress Notes (Signed)
Cardiology Office Note:    Date:  11/09/2022   ID:  Angelica Sharp, DOB 1966/04/11, MRN [id number]  PCP:  Alveria Apley, NP   [facility] Providers Cardiologist:  Nanetta Batty, MD     Referring MD: No ref. provider found   Chief Complaint  Patient presents with   Follow-up    Seen for Dr. Allyson Sabal    History of Present Illness:    Angelica Sharp is a 57 y.o. female with a hx of hypertension, hyperlipidemia, DM 2 and GERD. Patient was initially referred to cardiology service for evaluation of palpitation, dyspnea and atypical chest pain. She never had a heart attack or stroke in the past. 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 Heart monitor obtained in August 2021 showed predominantly sinus rhythm with occasional PVCs and couplets, short runs of SVT. Coronary calcium score was 0.  Echocardiogram in August 2022 showed EF 55 to 60%, no regional wall motion normality, trivial MR, mildly dilated ascending aorta measuring at 40 mm.  Patient was recently seen by Bernadene Person NP on 09/21/2022 at which time she complained of pain between her shoulder blades and shortness of breath.  Coronary CT obtained on 10/01/2022 demonstrated coronary calcium score of 0, normal coronary arteries, small PFO.  Subsequent echocardiogram obtained on 10/17/2022 demonstrated EF 55 to 60%, no regional wall motion abnormality, RV systolic pressure 31.5 mmHg, trivial MR, mildly dilated aorta measuring at 38 mm.  Patient presents today for follow-up.  She denies any significant chest pain.  She says she continued to have intermittent back pain especially after doing heavy activity.  This sounds more musculoskeletal in nature.  There is no report of dissection seen on the recent coronary CT.  Her shortness of breath has been worsening over the past several months, I will get a D-dimer, if negative then no further workup is needed and she can  follow-up in 1 year.  Past Medical History:  Diagnosis Date   Allergy    Anemia    Anxiety    Arthritis    Borderline hypertension    Chest pain    Complication of anesthesia    Degenerative lumbar disc    L5S1   Diabetes mellitus without complication (HCC)    Diverticulitis    Dyspnea    Edema    lower extremity   Family history of anesthesia complication    sister had BP complications during anes.   Fatty liver    GERD (gastroesophageal reflux disease)    H/O hiatal hernia    Headache    HLD (hyperlipidemia)    Hypertension    Pneumonia    PONV (postoperative nausea and vomiting)    SOB (shortness of breath)     Past Surgical History:  Procedure Laterality Date   BREAST LUMPECTOMY     4 lumps -- two from right -- one from left   LAPAROSCOPIC ASSISTED VAGINAL HYSTERECTOMY N/A 09/18/2012   Procedure: LAPAROSCOPIC ASSISTED VAGINAL HYSTERECTOMY;  Surgeon: Mitchel Honour, DO;  Location: [facility];  Service: Gynecology;  Laterality: N/A;  BLADDER  DISTENTION   TOTAL HIP ARTHROPLASTY Left 11/10/2018   Procedure: LEFT TOTAL HIP ARTHROPLASTY ANTERIOR APPROACH;  Surgeon: Tarry Kos, MD;  Location: [facility];  Service: Orthopedics;  Laterality: Left;   TUBAL LIGATION      Current Medications: Current Meds  Medication Sig   Calcium Carb-Cholecalciferol (CALCIUM+D3 PO) Take 1 tablet by  mouth daily.   famotidine (PEPCID) 40 MG tablet Take 40 mg by mouth daily.   Ferrous Sulfate (IRON PO) Take by mouth.   fluconazole (DIFLUCAN) 200 MG tablet Take 1 tablet (200 mg total) by mouth daily as needed for up to 1 dose. Take if you develop symptoms of vaginal yeast infection   glucose blood test strip Use as instructed   hydrochlorothiazide (HYDRODIURIL) 25 MG tablet Take 1 tablet by mouth once daily   ibuprofen (ADVIL) 800 MG tablet Take 1 tablet (800 mg total) by mouth every 8 (eight) hours as needed.   loperamide (IMODIUM A-D) 2 MG tablet Take 4 mg by mouth 4 (four) times daily as needed for  diarrhea or loose stools.    magnesium oxide (MAG-OX) 400 (240 Mg) MG tablet Take 400 mg by mouth daily.   Melatonin 5 MG CAPS Take 1 capsule by mouth at bedtime.   metFORMIN (GLUCOPHAGE) 500 MG tablet Take 1 tablet by mouth in the morning, at noon, and at bedtime.   methocarbamol (ROBAXIN-750) 750 MG tablet Take 1 tablet (750 mg total) by mouth 2 (two) times daily as needed for muscle spasms.   polyethylene glycol (MIRALAX / GLYCOLAX) packet Take 17 g by mouth daily as needed for mild constipation.    Potassium 99 MG TABS Take 99-198 mg by mouth See admin instructions. Take 198 mg in the morning and 99 mg at night   pravastatin (PRAVACHOL) 40 MG tablet TAKE 1 TABLET BY MOUTH AT BEDTIME     Allergies:   Sulfa antibiotics, Morphine and codeine, and Septra [sulfamethoxazole-trimethoprim]   Social History   Socioeconomic History   Marital status: Divorced    Spouse name: Not on file   Number of children: 3   Years of education: Not on file   Highest education level: Not on file  Occupational History   Occupation: Mortgage    Employer: [employer]    Comment: [employer] - now works from home  Tobacco Use   Smoking status: Never   Smokeless tobacco: Never  Vaping Use   Vaping Use: Never used  Substance and Sexual Activity   Alcohol use: No   Drug use: No   Sexual activity: Not Currently  Other Topics Concern   Not on file  Social History Narrative   Lives with daughter and grandkids.   Social Determinants of Health   Financial Resource Strain: Not on file  Food Insecurity: Not on file  Transportation Needs: Not on file  Physical Activity: Not on file  Stress: Not on file  Social Connections: Not on file     Family History: The patient's family history includes Cancer in her nephew and sister; Clotting disorder in her father; Crohn's disease in her niece; Diabetes in her mother and sister; Diabetes (age of onset: 52) in her son; Heart attack in her mother;  Heart disease in her sister; Hyperlipidemia in her mother; Hypertension in her mother; Kidney cancer in her sister; Kidney disease in her sister; Pulmonary embolism in her father; Stroke in her sister. There is no history of Stomach cancer, Rectal cancer, Esophageal cancer, or Colon cancer.  ROS:   Please see the history of present illness.     All other systems reviewed and are negative.  EKGs/Labs/Other Studies Reviewed:    The following studies were reviewed today:  Echo 10/17/2022  1. Left ventricular ejection fraction, by estimation, is 55 to 60%. The  left ventricle has normal function. The left  ventricle has no regional  wall motion abnormalities. Left ventricular diastolic parameters were  normal. The average left ventricular  global longitudinal strain is -22.5 %.   2. Right ventricular systolic function is normal. The right ventricular  size is normal. There is normal pulmonary artery systolic pressure. The  estimated right ventricular systolic pressure is 31.5 mmHg.   3. The mitral valve is normal in structure. Trivial mitral valve  regurgitation. No evidence of mitral stenosis.   4. The aortic valve was not well visualized. Aortic valve regurgitation  is not visualized. No aortic stenosis is present.   5. Aortic dilatation noted. There is mild dilatation of the ascending  aorta, measuring 38 mm.   6. The inferior vena cava is normal in size with greater than 50%  respiratory variability, suggesting right atrial pressure of 3 mmHg.   EKG:  EKG is not ordered today.    Recent Labs: 09/28/2022: ALT 19; BUN 9; Creatinine, Ser 0.97; Hemoglobin 12.8; Platelets 263; Potassium 3.6; Sodium 136  Recent Lipid Panel    Component Value Date/Time   CHOL 190 08/31/2019 1447   TRIG 307 (H) 08/31/2019 1447   HDL 42 08/31/2019 1447   CHOLHDL 4.5 (H) 08/31/2019 1447   CHOLHDL 6.9 (H) 03/29/2015 1249   VLDL 49 (H) 03/29/2015 1249   LDLCALC 96 08/31/2019 1447     Risk  Assessment/Calculations:           Physical Exam:    VS:  BP 124/86   Pulse 85   Ht 5\' 7"  (1.702 m)   Wt 200 lb 9.6 oz (91 kg)   LMP 08/10/2012 Comment: ovaries remain//a.c.  SpO2 98%   BMI 31.42 kg/m        Wt Readings from Last 3 Encounters:  11/07/22 200 lb 9.6 oz (91 kg)  09/28/22 195 lb (88.5 kg)  09/21/22 198 lb 9.6 oz (90.1 kg)     GEN:  Well nourished, well developed in no acute distress HEENT: Normal NECK: No JVD; No carotid bruits LYMPHATICS: No lymphadenopathy CARDIAC: RRR, no murmurs, rubs, gallops RESPIRATORY:  Clear to auscultation without rales, wheezing or rhonchi  ABDOMEN: Soft, non-tender, non-distended MUSCULOSKELETAL:  No edema; No deformity  SKIN: Warm and dry NEUROLOGIC:  Alert and oriented x 3 PSYCHIATRIC:  Normal affect   ASSESSMENT:    1. Dyspnea, unspecified type    PLAN:    In order of problems listed above:  Dyspnea: Recent coronary CT showed normal coronary arteries.  Will obtain D-dimer, if negative, no additional workup.  Patient can follow-up in 1 year  Hypertension: Blood pressure stable  Hyperlipidemia: On pravastatin  DM2: Managed by primary care provider.           Medication Adjustments/Labs and Tests Ordered: Current medicines are reviewed at length with the patient today.  Concerns regarding medicines are outlined above.  Orders Placed This Encounter  Procedures   D-dimer, quantitative   No orders of the defined types were placed in this encounter.   Patient Instructions  Medication Instructions:   Your physician recommends that you continue on your current medications as directed. Please refer to the Current Medication list given to you today.  *If you need a refill on your cardiac medications before your next appointment, please call your pharmacy*  Lab Work: Your physician recommends that you have lab work TODAY:  D-Dimer  If you have labs (blood work) drawn today and your tests are completely  normal, you will receive your results only  by: MyChart Message (if you have MyChart) OR A paper copy in the mail If you have any lab test that is abnormal or we need to change your treatment, we will call you to review the results.  Testing/Procedures: NONE ordered at this time of appointment   Follow-Up: At [facility], you and your health needs are our priority.  As part of our continuing mission to provide you with exceptional heart care, we have created designated Provider Care Teams.  These Care Teams include your primary Cardiologist (physician) and Advanced Practice Providers (APPs -  Physician Assistants and Nurse Practitioners) who all work together to provide you with the care you need, when you need it.   Your next appointment:   1 year(s)  Provider:   Nanetta Batty, MD     Other Instructions     Signed, Azalee Course, PA  11/09/2022 11:14 PM    [facility]

## 2022-11-07 NOTE — Patient Instructions (Addendum)
Medication Instructions:   Your physician recommends that you continue on your current medications as directed. Please refer to the Current Medication list given to you today.  *If you need a refill on your cardiac medications before your next appointment, please call your pharmacy*  Lab Work: Your physician recommends that you have lab work TODAY:  D-Dimer  If you have labs (blood work) drawn today and your tests are completely normal, you will receive your results only by: MyChart Message (if you have MyChart) OR A paper copy in the mail If you have any lab test that is abnormal or we need to change your treatment, we will call you to review the results.  Testing/Procedures: NONE ordered at this time of appointment   Follow-Up: At [facility], you and your health needs are our priority.  As part of our continuing mission to provide you with exceptional heart care, we have created designated Provider Care Teams.  These Care Teams include your primary Cardiologist (physician) and Advanced Practice Providers (APPs -  Physician Assistants and Nurse Practitioners) who all work together to provide you with the care you need, when you need it.   Your next appointment:   1 year(s)  Provider:   Nanetta Batty, MD     Other Instructions

## 2022-11-08 LAB — D-DIMER, QUANTITATIVE: D-DIMER: 0.23 mg/L FEU (ref 0.00–0.49)

## 2022-11-08 NOTE — Progress Notes (Signed)
D-dimer negative, very reassuring, which makes blood clot extremely unlikely

## 2022-11-09 ENCOUNTER — Encounter: Payer: Self-pay | Admitting: Physician Assistant

## 2022-11-12 NOTE — Progress Notes (Signed)
Faxed to [facility].

## 2022-11-14 ENCOUNTER — Telehealth: Payer: No Typology Code available for payment source | Admitting: Family

## 2022-11-14 DIAGNOSIS — R1032 Left lower quadrant pain: Secondary | ICD-10-CM

## 2022-11-14 DIAGNOSIS — R110 Nausea: Secondary | ICD-10-CM

## 2022-11-14 NOTE — Progress Notes (Signed)
Because of your abdominal pain, I feel your condition warrants further evaluation and I recommend that you be seen in a face to face visit.   NOTE: There will be NO CHARGE for this eVisit   If you are having a true medical emergency please call 911.      For an urgent face to face visit, [place] has eight urgent care centers for your convenience:   NEW!! [facility] at [facility] Get Driving Directions [phone] [address], [address] [place], [address]    [facility] at [facility] Get Driving Directions [phone] [address] [place], [place] [address]   [facility]) Get Driving Directions [phone] [address] [place], [place] [address]  [facility]) Get Driving Directions [phone] [address] [place],  [place]  [address]  [facility] - at [employer]  [phone] [address] [place],  [place] [address]   [facility] at [facility] Get Driving Directions [phone] [address] [place], [place] [address]   [facility] at [facility] Get Driving Directions  [phone] [address] [place], [place] [address]   [facility] at [facility] Directions [phone] [address] [place], [place] [address]  Your MyChart E-visit questionnaire answers were reviewed by a board certified advanced clinical practitioner to complete your personal care plan based on your specific symptoms.  Thank you for using e-Visits.

## 2023-02-02 ENCOUNTER — Telehealth: Payer: No Typology Code available for payment source | Admitting: Nurse Practitioner

## 2023-02-02 DIAGNOSIS — N3000 Acute cystitis without hematuria: Secondary | ICD-10-CM

## 2023-02-02 MED ORDER — CEPHALEXIN 500 MG PO CAPS: 500.0000 mg | ORAL_CAPSULE | Freq: Two times a day (BID) | ORAL | 0 refills | Status: DC

## 2023-02-02 NOTE — Addendum Note (Signed)
Addended by: Bennie Pierini on: 02/02/2023 04:22 PM   Modules accepted: Level of Service

## 2023-02-02 NOTE — Progress Notes (Signed)
E-Visit for Urinary Problems  We are sorry that you are not feeling well.  Here is how we plan to help!  Based on what you shared with me it looks like you most likely have a simple urinary tract infection.  A UTI (Urinary Tract Infection) is a bacterial infection of the bladder.  Most cases of urinary tract infections are simple to treat but a key part of your care is to encourage you to drink plenty of fluids and watch your symptoms carefully.  I have prescribed Keflex 500 mg twice a day for 7 days.  Your symptoms should gradually improve. Call us if the burning in your urine worsens, you develop worsening fever, back pain or pelvic pain or if your symptoms do not resolve after completing the antibiotic.  Urinary tract infections can be prevented by drinking plenty of water to keep your body hydrated.  Also be sure when you wipe, wipe from front to back and don't hold it in!  If possible, empty your bladder every 4 hours.  HOME CARE Drink plenty of fluids Compete the full course of the antibiotics even if the symptoms resolve Remember, when you need to go.go. Holding in your urine can increase the likelihood of getting a UTI! GET HELP RIGHT AWAY IF: You cannot urinate You get a high fever Worsening back pain occurs You see blood in your urine You feel sick to your stomach or throw up You feel like you are going to pass out  MAKE SURE YOU  Understand these instructions. Will watch your condition. Will get help right away if you are not doing well or get worse.   Thank you for choosing an e-visit.  Your e-visit answers were reviewed by a board certified advanced clinical practitioner to complete your personal care plan. Depending upon the condition, your plan could have included both over the counter or prescription medications.  Please review your pharmacy choice. Make sure the pharmacy is open so you can pick up prescription now. If there is a problem, you may contact your  provider through MyChart messaging and have the prescription routed to another pharmacy.  Your safety is important to us. If you have drug allergies check your prescription carefully.   For the next 24 hours you can use MyChart to ask questions about today's visit, request a non-urgent call back, or ask for a work or school excuse. You will get an email in the next two days asking about your experience. I hope that your e-visit has been valuable and will speed your recovery.   Mary-Margaret Martin, FNP   5-10 minutes spent reviewing and documenting in chart.   

## 2023-02-25 ENCOUNTER — Other Ambulatory Visit (INDEPENDENT_AMBULATORY_CARE_PROVIDER_SITE_OTHER): Payer: No Typology Code available for payment source

## 2023-02-25 ENCOUNTER — Other Ambulatory Visit: Payer: Self-pay

## 2023-02-25 ENCOUNTER — Encounter: Payer: Self-pay | Admitting: Sports Medicine

## 2023-02-25 ENCOUNTER — Ambulatory Visit: Payer: No Typology Code available for payment source | Admitting: Sports Medicine

## 2023-02-25 DIAGNOSIS — E119 Type 2 diabetes mellitus without complications: Secondary | ICD-10-CM

## 2023-02-25 DIAGNOSIS — M25512 Pain in left shoulder: Secondary | ICD-10-CM

## 2023-02-25 DIAGNOSIS — G8929 Other chronic pain: Secondary | ICD-10-CM

## 2023-02-25 DIAGNOSIS — M25511 Pain in right shoulder: Secondary | ICD-10-CM | POA: Diagnosis not present

## 2023-02-25 DIAGNOSIS — M19011 Primary osteoarthritis, right shoulder: Secondary | ICD-10-CM

## 2023-02-25 MED ORDER — METHYLPREDNISOLONE ACETATE 40 MG/ML IJ SUSP
40.0000 mg | INTRAMUSCULAR | Status: AC | PRN
Start: 2023-02-25 — End: 2023-02-25
  Administered 2023-02-25: 40 mg via INTRA_ARTICULAR

## 2023-02-25 MED ORDER — LIDOCAINE HCL 1 % IJ SOLN
0.5000 mL | INTRAMUSCULAR | Status: AC | PRN
Start: 2023-02-25 — End: 2023-02-25
  Administered 2023-02-25: .5 mL

## 2023-02-25 NOTE — Progress Notes (Signed)
Angelica Sharp - 57 y.o. female MRN [id number]  Date of birth: Jan 08, 1966  Office Visit Note: Visit Date: 02/25/2023 PCP: Sherlie Ban, NP Referred by: Madelyn Brunner, DO  Subjective: Chief Complaint  Patient presents with   Right Shoulder - Pain   HPI: Angelica Sharp is a pleasant 57 y.o. female who presents today for chronic right shoulder pain x 2 months.   Right shoulder pain -pain has been bothering her on and off for the last 8 weeks or so.  Insidious onset.  Has pain when laying on that side at nighttime.  At times the pain will radiate down the lateral deltoid as well as proximally up into the neck.  Had 1 instance with tingling in the fingers but otherwise denies any numbness tingling or weakness of the hand.  Is using Tylenol, Aleve and pain patches.  Has tried meloxicam 15 mg in the past without much relief.  Heat does help.  Left shoulder pain -this is doing well.  Feels like she is at least 90% improved after left AC joint injection back in November of last year.  She is a type-II diabetic.  Managed on metformin 500 mg 3 times daily. Lab Results  Component Value Date   HGBA1C 7.2 (A) 11/24/2019   Pertinent ROS were reviewed with the patient and found to be negative unless otherwise specified above in HPI.   Assessment & Plan: Visit Diagnoses:  1. Chronic right shoulder pain   2. Osteoarthritis of right acromioclavicular joint   3. Controlled type 2 diabetes mellitus without complication, without long-term current use of insulin (HCC)   4. Pain in left acromioclavicular joint    Plan: Discussed with Madylan the nature of her right shoulder pain - discussed with her she has 2 likely causes, both which may be contributing.  She does have some AC joint arthritic change and is tender in this location.  She also has pain laying on that shoulder and has pain radiating down to the deltoid, which could be coming from the subacromial joint versus underlying rotator cuff irritation.   She however has excellent strength with rotator cuff testing and full range of motion so I am less suspicious for any high-grade tearing.  Through shared decision-making, we elected to proceed with an ultrasound-guided right [facility] joint injection for both diagnostic and hopefully therapeutic purposes.  She tolerated well.  I would like her to resume her home exercises for the right and left shoulder after 40-72 hours after the injection.  Given that meloxicam was not helping, she may discontinue this altogether and may use Tylenol or Aleve for any postinjection pain.  Heat is fine as well. May have transient elevation in sugars given the injection, but her A1c is well-controlled, she will continue on her metformin 500 mg 3 times daily, will follow-up with her PCP for this.  She will see me back in 3 weeks to reevaluate the right shoulder, if she has good relief of her pain it is more likely coming from the [facility] joint.  If not we may consider drawing our attention to the rotator cuff or subacromial space and could consider an injection here. F/u in 3 weeks.  Follow-up: Return in about 3 weeks (around 03/18/2023) for for R-shoulder .   Meds & Orders: No orders of the defined types were placed in this encounter.   Orders Placed This Encounter  Procedures   Medium Joint Inj: R acromioclavicular   XR Shoulder Right   US Guided  Needle Placement - No Linked Charges     Procedures: Medium Joint Inj: R acromioclavicular on 02/25/2023 11:15 AM Indications: pain and diagnostic evaluation Details: 25 G 1.5 in needle, ultrasound-guided anterior approach Medications: 0.5 mL lidocaine 1 %; 40 mg methylPREDNISolone acetate 40 MG/ML  US-guided AC Joint injection, right shoulder After discussion on risks/benefits/indications, informed verbal consent was obtained. A timeout was then performed. The patient was seated in examination room. The area overlying the [facility] joint of the shoulder was prepped with Betadine and alcohol  swab then utilizing ultrasound guidance, patient's AC joint was injected using a 25G, 1.5" needle with 0.5:1.12mL lidocaine:depomedrol of injectate via an out-of-plane, walk-down approach. Visualization of injectate flow was noted under ultrasound guidance. Patient tolerated the procedure well without immediate complications.   Procedure, treatment alternatives, risks and benefits explained, specific risks discussed. Consent was given by the patient. Immediately prior to procedure a time out was called to verify the correct patient, procedure, equipment, support staff and site/side marked as required. Patient was prepped and draped in the usual sterile fashion.          Clinical History:   She reports that she has never smoked. She has never used smokeless tobacco. No results for input(s): "HGBA1C", "LABURIC" in the last 8760 hours.  Objective:   Vital Signs: LMP 08/10/2012 Comment: ovaries remain//a.c.  Physical Exam  Gen: Well-appearing, in no acute distress; non-toxic CV: Well-perfused. Warm.  Resp: Breathing unlabored on room air; no wheezing. Psych: Fluid speech in conversation; appropriate affect; normal thought process Neuro: Sensation intact throughout. No gross coordination deficits.   Ortho Exam - Right shoulder: + TTP over the [facility] joint.  Overlying skin changes or swelling.  There is full active and passive range of motion although pain at endrange flexion.  Positive scarf test, very mild pain with Hawkins impingement testing.  Otherwise negative provocative maneuvers, equivocal crossarm adduction.   Imaging:  XR Shoulder Right  Result Date: 02/25/2023 3 views of the right shoulder including Grashey, scapular Y and axial views were ordered and reviewed by myself.  There is moderate AC joint arthritic change.  Humeral head is well located without any significant glenohumeral joint arthropathy.  No acute fracture or otherwise bony abnormality noted.   Past  Medical/Family/Surgical/Social History: Medications & Allergies reviewed per EMR, new medications updated. 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   Fatty liver    GERD (gastroesophageal reflux disease)    H/O hiatal hernia    Headache    HLD (hyperlipidemia)    Hypertension    Pneumonia    PONV (postoperative nausea and vomiting)    SOB (shortness of breath)    Family History  Problem Relation Age of Onset   Heart attack Mother    Diabetes Mother    Hyperlipidemia Mother    Hypertension Mother    Stroke Sister    Pulmonary embolism Father    Clotting disorder Father    Diabetes Sister    Kidney disease Sister        kidney failure   Heart disease Sister    Cancer Sister        abdominal   Diabetes Son 27       TYPE 1   Kidney cancer Sister    Crohn's disease Niece    Cancer Nephew        gallbladder   Stomach cancer Neg Hx    Rectal cancer Neg Hx     Esophageal cancer Neg Hx    Colon cancer Neg Hx    Past Surgical History:  Procedure Laterality Date   BREAST LUMPECTOMY     4 lumps -- two from right -- one from left   LAPAROSCOPIC ASSISTED VAGINAL HYSTERECTOMY N/A 09/18/2012   Procedure: LAPAROSCOPIC ASSISTED VAGINAL HYSTERECTOMY;  Surgeon: Mitchel Honour, DO;  Location: [facility];  Service: Gynecology;  Laterality: N/A;  BLADDER  DISTENTION   TOTAL HIP ARTHROPLASTY Left 11/10/2018   Procedure: LEFT TOTAL HIP ARTHROPLASTY ANTERIOR APPROACH;  Surgeon: Tarry Kos, MD;  Location: [facility];  Service: Orthopedics;  Laterality: Left;   TUBAL LIGATION     Social History   Occupational History   Occupation: Designer, industrial/product: Orthoptist    Comment: [employer] - now works from home  Tobacco Use   Smoking status: Never   Smokeless tobacco: Never  Vaping Use   Vaping status: Never Used  Substance and Sexual Activity   Alcohol use: No   Drug use: No   Sexual activity: Not Currently

## 2023-03-06 ENCOUNTER — Telehealth: Payer: No Typology Code available for payment source | Admitting: Physician Assistant

## 2023-03-06 DIAGNOSIS — R3989 Other symptoms and signs involving the genitourinary system: Secondary | ICD-10-CM | POA: Diagnosis not present

## 2023-03-06 MED ORDER — CEPHALEXIN 500 MG PO CAPS: 500.0000 mg | ORAL_CAPSULE | Freq: Two times a day (BID) | ORAL | 0 refills | Status: AC

## 2023-03-06 NOTE — Progress Notes (Signed)
I have spent 5 minutes in review of e-visit questionnaire, review and updating patient chart, medical decision making and response to patient.   Mia Milan Cody Jacklynn Dehaas, PA-C    

## 2023-03-06 NOTE — Progress Notes (Signed)

## 2023-03-07 ENCOUNTER — Ambulatory Visit: Payer: No Typology Code available for payment source | Admitting: Sports Medicine

## 2023-03-18 ENCOUNTER — Encounter: Payer: Self-pay | Admitting: Sports Medicine

## 2023-03-18 ENCOUNTER — Ambulatory Visit: Payer: No Typology Code available for payment source | Admitting: Sports Medicine

## 2023-03-18 DIAGNOSIS — M19011 Primary osteoarthritis, right shoulder: Secondary | ICD-10-CM | POA: Diagnosis not present

## 2023-03-18 DIAGNOSIS — M25511 Pain in right shoulder: Secondary | ICD-10-CM | POA: Diagnosis not present

## 2023-03-18 DIAGNOSIS — G8929 Other chronic pain: Secondary | ICD-10-CM

## 2023-03-18 MED ORDER — MELOXICAM 7.5 MG PO TABS: 7.5000 mg | ORAL_TABLET | Freq: Two times a day (BID) | ORAL | 0 refills | Status: AC

## 2023-03-18 NOTE — Progress Notes (Addendum)
Angelica Sharp - 57 y.o. female MRN [id number]  Date of birth: 15-Apr-1966  Office Visit Note: Visit Date: 03/18/2023 PCP: Sherlie Ban, NP Referred by: Sherlie Ban, NP  Subjective: Chief Complaint  Patient presents with   Right Shoulder - Follow-up   HPI: Angelica Sharp is a pleasant 57 y.o. female who presents today for chronic right shoulder pain.  Right shoulder pain -continues to bother her over the anterior aspect of the shoulder.  We did perform ultrasound-guided AC joint injection on 02/25/2023.  This did help her pain somewhat in that location but she is still having rather bothersome pain.  Had a few instances where the arm felt stuck for a few minutes and this was painful.  Had pain with reaching out to swat.  Transition from Aleve to Tylenol arthritis which does help to some extent.  Left shoulder - prior GHJ and AC joint injection. Doing great.  Pertinent ROS were reviewed with the patient and found to be negative unless otherwise specified above in HPI.   Assessment & Plan: Visit Diagnoses:  1. Chronic right shoulder pain   2. Osteoarthritis of right acromioclavicular joint    Plan: Discussed with Faiza the nature of her chronic right shoulder pain -she did receive some relief from the [facility] joint injection but still has rather bothersome anterior shoulder pain.  She has tenderness palpating within the bicipital groove over the proximal bicep tendon, but speeds testing and other provocative maneuvers are relatively benign.  She has good strength of the rotator cuff testing as well.  Given the chronicity of her right shoulder pain and her next presentation, I think it is pertinent to obtain an MRI of the shoulder to evaluate the bicep tendon, rotator cuff and the cartilage within the glenohumeral joint.  Continue Tylenol as needed. Will start her on Meloxicam 7.5 mg BID (or 15mg  every day) for the next few weeks for pain control. She will follow-up in 1 week after MRI scan to  review and discuss next steps.  Okay for her to continue her home exercises in the meantime, I will modify or stop if certain exercises are painful.  Follow-up: Return for f/u 1-week after MRI of shoulder to review and discuss next steps.   Meds & Orders:  Meds ordered this encounter  Medications   meloxicam (MOBIC) 7.5 MG tablet    Sig: Take 1 tablet (7.5 mg total) by mouth in the morning and at bedtime.    Dispense:  60 tablet    Refill:  0    Orders Placed This Encounter  Procedures   MR SHOULDER RIGHT WO CONTRAST     Procedures: No procedures performed      Clinical History:   She reports that she has never smoked. She has never used smokeless tobacco. No results for input(s): "HGBA1C", "LABURIC" in the last 8760 hours.  Objective:   Vital Signs: LMP 08/10/2012 Comment: ovaries remain//a.c.  Physical Exam  Gen: Well-appearing, in no acute distress; non-toxic CV: Well-perfused. Warm.  Resp: Breathing unlabored on room air; no wheezing. Psych: Fluid speech in conversation; appropriate affect; normal thought process Neuro: Sensation intact throughout. No gross coordination deficits.   Ortho Exam - Right shoulder: + TTP within the humeral groove and over the anterior joint space.  No TTP over the [facility] joint today.  There is full active and passive range of motion.  There is some mild crepitus palpating over the bicipital groove with resisted internal and external rotation, however  negative speeds test.  Negative crossarm adduction, negative scarf's test.  There is no strength deficits with rotator cuff testing and the remainder of provocative examinations is relatively benign.  Imaging: No results found.  Past Medical/Family/Surgical/Social History: Medications & Allergies reviewed per EMR, new medications updated. 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   Fatty liver    GERD (gastroesophageal reflux disease)    H/O hiatal hernia    Headache    HLD (hyperlipidemia)    Hypertension    Pneumonia    PONV (postoperative nausea and vomiting)    SOB (shortness of breath)    Family History  Problem Relation Age of Onset   Heart attack Mother    Diabetes Mother    Hyperlipidemia Mother    Hypertension Mother    Stroke Sister    Pulmonary embolism Father    Clotting disorder Father    Diabetes Sister    Kidney disease Sister        kidney failure   Heart disease Sister    Cancer Sister        abdominal   Diabetes Son 27       TYPE 1   Kidney cancer Sister    Crohn's disease Niece    Cancer Nephew        gallbladder   Stomach cancer Neg Hx    Rectal cancer Neg Hx    Esophageal cancer Neg Hx    Colon cancer Neg Hx    Past Surgical History:  Procedure Laterality Date   BREAST LUMPECTOMY     4 lumps -- two from right -- one from left   LAPAROSCOPIC ASSISTED VAGINAL HYSTERECTOMY N/A 09/18/2012   Procedure: LAPAROSCOPIC ASSISTED VAGINAL  HYSTERECTOMY;  Surgeon: Mitchel Honour, DO;  Location: [facility];  Service: Gynecology;  Laterality: N/A;  BLADDER  DISTENTION   TOTAL HIP ARTHROPLASTY Left 11/10/2018   Procedure: LEFT TOTAL HIP ARTHROPLASTY ANTERIOR APPROACH;  Surgeon: Tarry Kos, MD;  Location: [facility];  Service: Orthopedics;  Laterality: Left;   TUBAL LIGATION     Social History   Occupational History   Occupation: Designer, industrial/product: [employer]    Comment: [employer] - now works from home  Tobacco Use   Smoking status: Never   Smokeless tobacco: Never  Vaping Use   Vaping  status: Never Used  Substance and Sexual Activity   Alcohol use: No   Drug use: No   Sexual activity: Not Currently

## 2023-03-18 NOTE — Progress Notes (Signed)
Patient says that she is not feeling much better. She says that there was an instance the week of the injection where she pushed off of her arm and it felt "stuck" for a couple of minutes and very painful. She says that she did start taking Tylenol Arthritis and that seems to help more than Aleve, but she only wants to take it once in the morning.

## 2023-03-21 ENCOUNTER — Other Ambulatory Visit (INDEPENDENT_AMBULATORY_CARE_PROVIDER_SITE_OTHER): Payer: No Typology Code available for payment source

## 2023-03-21 ENCOUNTER — Ambulatory Visit (INDEPENDENT_AMBULATORY_CARE_PROVIDER_SITE_OTHER): Payer: No Typology Code available for payment source | Admitting: Physician Assistant

## 2023-03-21 DIAGNOSIS — M25551 Pain in right hip: Secondary | ICD-10-CM | POA: Diagnosis not present

## 2023-03-21 DIAGNOSIS — M25552 Pain in left hip: Secondary | ICD-10-CM

## 2023-03-21 NOTE — Addendum Note (Signed)
Addended by: Wendi Maya on: 03/21/2023 12:40 PM   Modules accepted: Orders

## 2023-03-21 NOTE — Progress Notes (Signed)
Office Visit Note   Patient: Angelica Sharp           Date of Birth: 08-31-1965           MRN: [id number] Visit Date: 03/21/2023              Requested by: Sherlie Ban, NP [phone] [address] [place],  [place] [address] PCP: Sherlie Ban, NP   Assessment & Plan: Visit Diagnoses:  1. Bilateral hip pain     Plan: Impression is chronic bilateral hip pain.  It is somewhat hard to distinguish whether this is from her back or hip however clinical exam of both are unremarkable.  X-rays of the hips are unremarkable.  MRI of the lumbar spine is nearly unremarkable.  We will go ahead and order a [facility] MRI for the left hip and an MR arthrogram of the right hip.  Follow-up with Dr. Roda Shutters to discuss results.  Follow-Up Instructions: Return for Follow-up with Dr. Roda Shutters to discuss MRI results.   Orders:  Orders Placed This Encounter  Procedures   XR HIPS BILAT W OR W/O PELVIS 3-4 VIEWS   No orders of the defined types were placed in this encounter.     Procedures: No procedures performed   Clinical Data: No additional findings.   Subjective: Chief Complaint  Patient presents with   Left Hip - Pain   Right Hip - Pain    HPI patient is a pleasant 57 year old female who comes in today with bilateral hip pain.  She is status post left total hip replacement in June 2020.  She has had continued pain since.  She also has weakness to both legs.  The pain she is having is not only to the groin but radiates into the buttock and tailbone.  This is constant but worse when she is sitting too long or walking too long.  She also notes some giving way to the right leg.  No paresthesias to either lower extremity.  She has been taking meloxicam and Tylenol without significant relief.  No bowel or bladder change or saddle paresthesias.  She underwent MRI of the lumbar spine back in 2022 that was relatively unremarkable.  Review of Systems as detailed in HPI.  All others reviewed and are  negative.   Objective: Vital Signs: LMP 08/10/2012 Comment: ovaries remain//a.c.  Physical Exam well-developed well-nourished female no acute distress.  Alert and oriented x 3.  Ortho Exam bilateral hip exam: No pain with logroll, FADIR or Stinchfield testing.  Negative straight leg raise both sides.  No tenderness to the greater trochanter.  Lumbar spine is unremarkable.  She is neurovascular intact distally.  Specialty Comments:  MRI LUMBAR SPINE WITHOUT CONTRAST   TECHNIQUE: Multiplanar, multisequence MR imaging of the lumbar spine was performed. No intravenous contrast was administered.   COMPARISON:  X-ray lumbar 03/31/2021.   FINDINGS: Segmentation:  Standard.   Alignment:  Physiologic.   Vertebrae: No fracture. No evidence of discitis. No suspicious bone lesion. 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 There are a few additional smaller intraosseous hemangiomas present.   Conus medullaris and cauda equina: Conus extends to the L2 level. Conus and cauda equina appear normal.   Paraspinal and other soft tissues: Left renal cyst. No acute findings.   Disc levels:   T12-L1: Tiny shallow disc protrusion. No foraminal or canal stenosis.   L1-L2: Unremarkable.   L2-L3: Unremarkable.   L3-L4: Unremarkable disc. Mild bilateral facet  hypertrophy. No foraminal or canal stenosis.   L4-L5: Unremarkable disc. Mild bilateral facet hypertrophy. No foraminal or canal stenosis.   L5-S1: Shallow central disc protrusion. Mild bilateral facet hypertrophy. No foraminal or canal stenosis.   IMPRESSION: Mild degenerative changes of the lumbar spine without foraminal or canal stenosis at any level.     Electronically Signed   By: Duanne Guess D.O.   On: 05/02/2021 10:26  Imaging: XR HIPS BILAT W OR W/O PELVIS 3-4 VIEWS  Result Date: 03/21/2023 Well-seated prosthesis on the left.  No acute or structural abnormalities on the right.     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   Fatty liver    GERD (gastroesophageal reflux disease)    H/O hiatal hernia    Headache    HLD (hyperlipidemia)    Hypertension    Pneumonia    PONV (postoperative nausea and vomiting)    SOB (shortness of breath)     Family History  Problem Relation Age of Onset   Heart attack Mother    Diabetes Mother    Hyperlipidemia Mother    Hypertension Mother    Stroke Sister    Pulmonary embolism Father    Clotting disorder Father    Diabetes Sister    Kidney disease Sister        kidney failure   Heart disease Sister    Cancer Sister        abdominal   Diabetes Son 27       TYPE 1   Kidney cancer Sister    Crohn's disease Niece    Cancer Nephew        gallbladder   Stomach cancer Neg Hx    Rectal cancer Neg Hx    Esophageal cancer Neg Hx    Colon cancer Neg Hx     Past Surgical History:  Procedure  Laterality Date   BREAST LUMPECTOMY     4 lumps -- two from right -- one from left   LAPAROSCOPIC ASSISTED VAGINAL HYSTERECTOMY N/A 09/18/2012   Procedure: LAPAROSCOPIC ASSISTED VAGINAL HYSTERECTOMY;  Surgeon: Mitchel Honour, DO;  Location: [facility];  Service: Gynecology;  Laterality: N/A;  BLADDER  DISTENTION   TOTAL HIP ARTHROPLASTY Left 11/10/2018   Procedure: LEFT TOTAL HIP ARTHROPLASTY ANTERIOR APPROACH;  Surgeon: Tarry Kos, MD;  Location: [facility];  Service: Orthopedics;  Laterality: Left;   TUBAL LIGATION     Social History   Occupational History   Occupation: Designer, industrial/product: Orthoptist    Comment: [employer] - now works from home  Tobacco Use   Smoking status: Never   Smokeless tobacco: Never  Vaping Use   Vaping status: Never Used  Substance and Sexual Activity   Alcohol use: No   Drug use: No   Sexual activity: Not Currently

## 2023-04-04 ENCOUNTER — Encounter: Payer: Self-pay | Admitting: Sports Medicine

## 2023-04-06 ENCOUNTER — Telehealth: Payer: No Typology Code available for payment source | Admitting: Family Medicine

## 2023-04-06 DIAGNOSIS — R3989 Other symptoms and signs involving the genitourinary system: Secondary | ICD-10-CM | POA: Diagnosis not present

## 2023-04-06 MED ORDER — CEPHALEXIN 500 MG PO CAPS
500.0000 mg | ORAL_CAPSULE | Freq: Two times a day (BID) | ORAL | 0 refills | Status: AC
Start: 2023-04-06 — End: 2023-04-11

## 2023-04-06 MED ORDER — NITROFURANTOIN MONOHYD MACRO 100 MG PO CAPS
100.0000 mg | ORAL_CAPSULE | Freq: Two times a day (BID) | ORAL | 0 refills | Status: DC
Start: 2023-04-06 — End: 2023-04-06

## 2023-04-06 NOTE — Progress Notes (Signed)
Patient stated she was allergic to Macrobid.  I will send Keflex instead.

## 2023-04-06 NOTE — Addendum Note (Signed)
Addended byReed Pandy on: 04/06/2023 12:13 PM   Modules accepted: Orders

## 2023-04-06 NOTE — Progress Notes (Signed)
E-Visit for Urinary Problems  We are sorry that you are not feeling well.  Here is how we plan to help!  Based on what you shared with me it looks like you most likely have a simple urinary tract infection.  A UTI (Urinary Tract Infection) is a bacterial infection of the bladder.  Most cases of urinary tract infections are simple to treat but a key part of your care is to encourage you to drink plenty of fluids and watch your symptoms carefully.  I have prescribed MacroBid 100 mg twice a day for 5 days.  Your symptoms should gradually improve. Call us if the burning in your urine worsens, you develop worsening fever, back pain or pelvic pain or if your symptoms do not resolve after completing the antibiotic.  Urinary tract infections can be prevented by drinking plenty of water to keep your body hydrated.  Also be sure when you wipe, wipe from front to back and don't hold it in!  If possible, empty your bladder every 4 hours.  HOME CARE Drink plenty of fluids Compete the full course of the antibiotics even if the symptoms resolve Remember, when you need to go.go. Holding in your urine can increase the likelihood of getting a UTI! GET HELP RIGHT AWAY IF: You cannot urinate You get a high fever Worsening back pain occurs You see blood in your urine You feel sick to your stomach or throw up You feel like you are going to pass out  MAKE SURE YOU  Understand these instructions. Will watch your condition. Will get help right away if you are not doing well or get worse.   Thank you for choosing an e-visit.  Your e-visit answers were reviewed by a board certified advanced clinical practitioner to complete your personal care plan. Depending upon the condition, your plan could have included both over the counter or prescription medications.  Please review your pharmacy choice. Make sure the pharmacy is open so you can pick up prescription now. If there is a problem, you may contact your  provider through [employer] and have the prescription routed to another pharmacy.  Your safety is important to [place]. If you have drug allergies check your prescription carefully.   For the next 24 hours you can use MyChart to ask questions about today's visit, request a non-urgent call back, or ask for a work or school excuse. You will get an email in the next two days asking about your experience. I hope that your e-visit has been valuable and will speed your recovery.   I have spent 5 minutes in review of e-visit questionnaire, review and updating patient chart, medical decision making and response to patient.   Gilberto Better, PA-C

## 2023-04-12 ENCOUNTER — Ambulatory Visit
Admission: RE | Admit: 2023-04-12 | Discharge: 2023-04-12 | Disposition: A | Discharge status: Home / Self Care | Payer: No Typology Code available for payment source | Source: Ambulatory Visit | Attending: Sports Medicine | Admitting: Sports Medicine

## 2023-04-12 DIAGNOSIS — G8929 Other chronic pain: Secondary | ICD-10-CM

## 2023-04-19 ENCOUNTER — Encounter: Payer: Self-pay | Admitting: Sports Medicine

## 2023-04-22 ENCOUNTER — Inpatient Hospital Stay
Admission: RE | Admit: 2023-04-22 | Discharge: 2023-04-22 | Discharge status: Home / Self Care | Payer: No Typology Code available for payment source | Source: Ambulatory Visit | Attending: Physician Assistant

## 2023-04-22 DIAGNOSIS — M25551 Pain in right hip: Secondary | ICD-10-CM

## 2023-04-25 ENCOUNTER — Encounter: Payer: Self-pay | Admitting: Physician Assistant

## 2023-04-26 ENCOUNTER — Other Ambulatory Visit: Payer: Self-pay | Admitting: Physician Assistant

## 2023-04-26 ENCOUNTER — Encounter: Payer: Self-pay | Admitting: Physician Assistant

## 2023-04-29 ENCOUNTER — Inpatient Hospital Stay
Admission: RE | Admit: 2023-04-29 | Discharge: 2023-04-29 | Disposition: A | Discharge status: Home / Self Care | Payer: No Typology Code available for payment source | Source: Ambulatory Visit | Attending: Physician Assistant

## 2023-04-29 ENCOUNTER — Inpatient Hospital Stay
Admission: RE | Admit: 2023-04-29 | Discharge: 2023-04-29 | Discharge status: Home / Self Care | Payer: No Typology Code available for payment source | Source: Ambulatory Visit | Attending: Physician Assistant

## 2023-04-29 ENCOUNTER — Encounter: Payer: Self-pay | Admitting: Sports Medicine

## 2023-04-29 DIAGNOSIS — M25551 Pain in right hip: Secondary | ICD-10-CM

## 2023-04-29 MED ORDER — IOPAMIDOL (ISOVUE-M 200) INJECTION 41%
10.0000 mL | Freq: Once | INTRAMUSCULAR | Status: AC
  Administered 2023-04-29: 10 mL via INTRA_ARTICULAR

## 2023-05-03 ENCOUNTER — Telehealth: Payer: No Typology Code available for payment source | Admitting: Family Medicine

## 2023-05-03 DIAGNOSIS — J069 Acute upper respiratory infection, unspecified: Secondary | ICD-10-CM

## 2023-05-03 MED ORDER — BENZONATATE 200 MG PO CAPS: 200.0000 mg | ORAL_CAPSULE | Freq: Two times a day (BID) | ORAL | 0 refills | Status: DC | PRN

## 2023-05-03 NOTE — Progress Notes (Signed)
.E-Visit for Cough  We are sorry that you are not feeling well.  Here is how we plan to help!  Based on your presentation I believe you most likely have A cough due to a virus.  This is called viral bronchitis and is best treated by rest, plenty of fluids and control of the cough.  You may use Ibuprofen or Tylenol as directed to help your symptoms.     In addition you may use A prescription cough medication called Tessalon Perles 100mg . You may take 1-2 capsules every 8 hours as needed for your cough.    From your responses in the eVisit questionnaire you describe inflammation in the upper respiratory tract which is causing a significant cough.  This is commonly called Bronchitis and has four common causes:   Allergies Viral Infections Acid Reflux Bacterial Infection Allergies, viruses and acid reflux are treated by controlling symptoms or eliminating the cause. An example might be a cough caused by taking certain blood pressure medications. You stop the cough by changing the medication. Another example might be a cough caused by acid reflux. Controlling the reflux helps control the cough.  USE OF BRONCHODILATOR ("RESCUE") INHALERS: There is a risk from using your bronchodilator too frequently.  The risk is that over-reliance on a medication which only relaxes the muscles surrounding the breathing tubes can reduce the effectiveness of medications prescribed to reduce swelling and congestion of the tubes themselves.  Although you feel brief relief from the bronchodilator inhaler, your asthma may actually be worsening with the tubes becoming more swollen and filled with mucus.  This can delay other crucial treatments, such as oral steroid medications. If you need to use a bronchodilator inhaler daily, several times per day, you should discuss this with your provider.  There are probably better treatments that could be used to keep your asthma under control.     HOME CARE Only take medications as  instructed by your medical team. Complete the entire course of an antibiotic. Drink plenty of fluids and get plenty of rest. Avoid close contacts especially the very young and the elderly Cover your mouth if you cough or cough into your sleeve. Always remember to wash your hands A steam or ultrasonic humidifier can help congestion.   GET HELP RIGHT AWAY IF: You develop worsening fever. You become short of breath You cough up blood. Your symptoms persist after you have completed your treatment plan MAKE SURE YOU  Understand these instructions. Will watch your condition. Will get help right away if you are not doing well or get worse.    Thank you for choosing an e-visit.  Your e-visit answers were reviewed by a board certified advanced clinical practitioner to complete your personal care plan. Depending upon the condition, your plan could have included both over the counter or prescription medications.  Please review your pharmacy choice. Make sure the pharmacy is open so you can pick up prescription now. If there is a problem, you may contact your provider through [employer] and have the prescription routed to another pharmacy.  Your safety is important to [place]. If you have drug allergies check your prescription carefully.   For the next 24 hours you can use MyChart to ask questions about today's visit, request a non-urgent call back, or ask for a work or school excuse. You will get an email in the next two days asking about your experience. I hope that your e-visit has been valuable and will speed your recovery.  have provided 5 minutes of non face to face time during this encounter for chart review and documentation.

## 2023-05-06 ENCOUNTER — Encounter: Payer: Self-pay | Admitting: Orthopaedic Surgery

## 2023-05-07 ENCOUNTER — Encounter: Payer: Self-pay | Admitting: Sports Medicine

## 2023-05-07 ENCOUNTER — Ambulatory Visit: Payer: No Typology Code available for payment source | Admitting: Sports Medicine

## 2023-05-07 DIAGNOSIS — G8929 Other chronic pain: Secondary | ICD-10-CM

## 2023-05-07 DIAGNOSIS — M25511 Pain in right shoulder: Secondary | ICD-10-CM

## 2023-05-07 DIAGNOSIS — M75101 Unspecified rotator cuff tear or rupture of right shoulder, not specified as traumatic: Secondary | ICD-10-CM | POA: Diagnosis not present

## 2023-05-07 MED ORDER — BUPIVACAINE HCL 0.25 % IJ SOLN
2.0000 mL | INTRAMUSCULAR | Status: AC | PRN
Start: 2023-05-07 — End: 2023-05-07
  Administered 2023-05-07: 2 mL via INTRA_ARTICULAR

## 2023-05-07 MED ORDER — LIDOCAINE HCL 1 % IJ SOLN
2.0000 mL | INTRAMUSCULAR | Status: AC | PRN
Start: 2023-05-07 — End: 2023-05-07
  Administered 2023-05-07: 2 mL

## 2023-05-07 MED ORDER — METHYLPREDNISOLONE ACETATE 40 MG/ML IJ SUSP
40.0000 mg | INTRAMUSCULAR | Status: AC | PRN
Start: 2023-05-07 — End: 2023-05-07
  Administered 2023-05-07: 40 mg via INTRA_ARTICULAR

## 2023-05-07 NOTE — Progress Notes (Signed)
Angelica Sharp - 57 y.o. female MRN [id number]  Date of birth: 1965-10-05  Office Visit Note: Visit Date: 05/07/2023 PCP: Sherlie Ban, NP Referred by: Sherlie Ban, NP  Subjective: Chief Complaint  Patient presents with   Right Shoulder - Follow-up   HPI: Angelica Sharp is a pleasant 57 y.o. female who presents today for follow-up of chronic right shoulder pain.  Her right shoulder continues to bother her, she did get good relief from her superior shoulder pain from the [facility] joint injection on 02/25/2023.  She is having more pain with reaching maneuvers specifically reaching backward.  Had an instance when she was trying to shoot basketball hoops with her grandson and this caused her pain.  Meloxicam in the past was not helpful, she is not taking any medication consistently.  She is continuing her shoulder rehab exercises Monday-Wednesday-Friday.  Pertinent ROS were reviewed with the patient and found to be negative unless otherwise specified above in HPI.   Assessment & Plan: Visit Diagnoses:  1. Chronic right shoulder pain   2. Nontraumatic tear of right supraspinatus tendon    Plan: Angelica Sharp is dealing with acute on chronic right shoulder pain, we did review her MRI today which shows a small articular sided interstitial tear of the supraspinatus anteriorly.  He does have some mild tendinosis of this tendon and the insertional infraspinatus as well.  She has failed oral medication as well as consistent therapy, at this point we did move forward with a subacromial joint injection.  She will hold from activity and rehab for the remainder of this week.  Starting on Monday she will get back into her home rotator cuff exercises rehab 3-4 times weekly.  I would like to see with the injection and the dedicated rotator cuff rehab how she is doing in about 6 weeks.  If she is still having pain at that time, she will call and notify me to discuss next steps.  Did discuss that based on the nature  of her tear, I do think she has a good chance of healing this conservatively.  If for some reason her pain is not largely improved with above, could consider sending her to Dr. August Saucer for surgical evaluation, we will hold on this for now.  Follow-up: Return in about 6 weeks (around 06/18/2023), or if symptoms worsen or fail to improve, for R-shoulder.   Meds & Orders: No orders of the defined types were placed in this encounter.   Orders Placed This Encounter  Procedures   Large Joint Inj     Procedures: Large Joint Inj: R subacromial bursa on 05/07/2023 3:28 PM Indications: pain Details: 22 G 1.5 in needle, posterior approach Medications: 2 mL lidocaine 1 %; 2 mL bupivacaine 0.25 %; 40 mg methylPREDNISolone acetate 40 MG/ML Outcome: tolerated well, no immediate complications  Subacromial Joint Injection, Right Shoulder After discussion on risks/benefits/indications, informed verbal consent was obtained. A timeout was then performed. Patient was seated on table in exam room. The patient's shoulder was prepped with betadine and alcohol swabs and utilizing posterior approach a 22G, 1.5" needle was directed anteriorly and laterally into the patient's subacromial space was injected with 2:2:1 mixture of lidocaine:bupivicaine:depomedrol with appreciation of free-flowing of the injectate into the bursal space. Patient tolerated the procedure well without immediate complications.   Procedure, treatment alternatives, risks and benefits explained, specific risks discussed. Consent was given by the patient. Immediately prior to procedure a time out was called to verify the correct patient, procedure,  equipment, support staff and site/side marked as required. Patient was prepped and draped in the usual sterile fashion.          Clinical History:   She reports that she has never smoked. She has never used smokeless tobacco. No results for input(s): "HGBA1C", "LABURIC" in the last 8760  hours.  Objective:    Physical Exam  Gen: Well-appearing, in no acute distress; non-toxic CV:  Well-perfused. Warm.  Resp: Breathing unlabored on room air; no wheezing. Psych: Fluid speech in conversation; appropriate affect; normal thought process Neuro: Sensation intact throughout. No gross coordination deficits.   Ortho Exam - Right shoulder: + TTP around Codman's point.  There is full active and passive range of motion with pain with internal rotation as well as full range abduction.  There is some pain with resisted external rotation although relatively well-preserved strength.  Imaging:  Narrative & Impression  CLINICAL DATA:  Right shoulder pain for months.   EXAM: MRI OF THE RIGHT SHOULDER WITHOUT CONTRAST   TECHNIQUE: Multiplanar, multisequence MR imaging of the shoulder was performed. No intravenous contrast was administered.   COMPARISON:  None Available.   FINDINGS: Rotator cuff: Mild supraspinatus tendinosis with a small shallow partial-thickness articular surface tear anteriorly. Infraspinatus tendon is intact. Teres minor tendon is intact. Subscapularis tendon is intact.   Muscles: No muscle atrophy or edema. No intramuscular fluid collection or hematoma.   Biceps Long Head: Intraarticular and extraarticular portions of the biceps tendon are intact.   Acromioclavicular Joint: Mild arthropathy of the acromioclavicular joint. Trace subacromial/subdeltoid bursal fluid.   Glenohumeral Joint: No joint effusion. No chondral defect.   Labrum: Grossly intact, but evaluation is limited by lack of intraarticular fluid/contrast.   Bones: No fracture or dislocation. No aggressive osseous lesion.   Other: No fluid collection or hematoma.   IMPRESSION: 1. Mild supraspinatus tendinosis with a small shallow partial-thickness articular surface tear anteriorly.     Electronically Signed   By: Elige Ko M.D.   On: 04/22/2023 05:41   Past  Medical/Family/Surgical/Social History: Medications & Allergies reviewed per EMR, new medications updated. 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   Primary osteoarthritis of left hip 08/22/2018   Essential hypertension 11/25/2017   Foot pain 11/23/2017   Chronic joint pain 03/29/2015   DM II (diabetes mellitus, type II), controlled (HCC) 10/01/2014   Obesity 03/12/2014   Diverticular disease of colon 09/11/2013   Hyperlipidemia 09/10/2013   Family history of cancer 08/30/2012   GERD (gastroesophageal reflux disease) 07/03/2011   Dyspnea on exertion 01/24/2010   Atypical chest pain 01/24/2010   Past Medical History:  Diagnosis Date   Allergy    Anemia    Anxiety    Arthritis    Borderline hypertension    Chest pain    Complication of anesthesia    Degenerative lumbar disc    L5S1   Diabetes mellitus without complication (HCC)    Diverticulitis    Dyspnea    Edema    lower extremity   Family history of anesthesia complication    sister had BP complications during anes.   Fatty liver    GERD (gastroesophageal reflux disease)    H/O hiatal hernia    Headache    HLD (hyperlipidemia)    Hypertension    Pneumonia  PONV (postoperative nausea and vomiting)    SOB (shortness of breath)    Family History  Problem Relation Age of Onset   Heart attack Mother    Diabetes Mother    Hyperlipidemia Mother    Hypertension Mother    Stroke Sister    Pulmonary embolism Father    Clotting disorder Father    Diabetes Sister    Kidney disease Sister        kidney failure   Heart disease Sister    Cancer Sister        abdominal   Diabetes Son 27       TYPE 1   Kidney cancer Sister    Crohn's disease Niece    Cancer Nephew        gallbladder   Stomach cancer Neg Hx    Rectal cancer Neg Hx     Esophageal cancer Neg Hx    Colon cancer Neg Hx    Past Surgical History:  Procedure Laterality Date   BREAST LUMPECTOMY     4 lumps -- two from right -- one from left   LAPAROSCOPIC ASSISTED VAGINAL HYSTERECTOMY N/A 09/18/2012   Procedure: LAPAROSCOPIC ASSISTED VAGINAL HYSTERECTOMY;  Surgeon: Mitchel Honour, DO;  Location: [facility];  Service: Gynecology;  Laterality: N/A;  BLADDER  DISTENTION   TOTAL HIP ARTHROPLASTY Left 11/10/2018   Procedure: LEFT TOTAL HIP ARTHROPLASTY ANTERIOR APPROACH;  Surgeon: Tarry Kos, MD;  Location: [facility];  Service: Orthopedics;  Laterality: Left;   TUBAL LIGATION     Social History   Occupational History   Occupation: Designer, industrial/product: Orthoptist    Comment: [employer] - now works from home  Tobacco Use   Smoking status: Never   Smokeless tobacco: Never  Vaping Use   Vaping status: Never Used  Substance and Sexual Activity   Alcohol use: No   Drug use: No   Sexual activity: Not Currently

## 2023-05-13 NOTE — Progress Notes (Signed)
F/u with xu to discuss

## 2023-05-20 NOTE — Progress Notes (Unsigned)
Office Visit Note   Patient: Angelica Sharp           Date of Birth: [birth year]           MRN: [id number] Visit Date: 05/21/2023              Requested by: Sherlie Ban, NP [phone] [place],  [place] [address] PCP: Sherlie Ban, NP   Assessment & Plan: Visit Diagnoses: No diagnosis found.  Plan: ***  Follow-Up Instructions: No follow-ups on file.   Orders:  No orders of the defined types were placed in this encounter.  No orders of the defined types were placed in this encounter.     Procedures: No procedures performed   Clinical Data: No additional findings.   Subjective: No chief complaint on file.   HPI  Review of Systems   Objective: Vital Signs: LMP 08/10/2012 Comment: ovaries remain//a.c.  Physical Exam  Ortho Exam  Specialty Comments:  MRI LUMBAR SPINE WITHOUT CONTRAST   TECHNIQUE: Multiplanar, multisequence MR imaging of the lumbar spine was performed. No intravenous contrast was administered.   COMPARISON:  X-ray lumbar 03/31/2021.   FINDINGS: Segmentation:  Standard.   Alignment:  Physiologic.   Vertebrae: No fracture. No evidence of discitis. No suspicious bone lesion. 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 There are a few additional smaller intraosseous hemangiomas present.   Conus medullaris and cauda equina: Conus extends to the L2 level. Conus and cauda equina appear normal.   Paraspinal and other soft tissues: Left renal cyst. No acute findings.   Disc levels:   T12-L1: Tiny shallow disc protrusion. No foraminal or canal stenosis.   L1-L2: Unremarkable.   L2-L3: Unremarkable.   L3-L4: Unremarkable disc. Mild bilateral facet hypertrophy. No foraminal or canal stenosis.   L4-L5: Unremarkable disc. Mild bilateral facet hypertrophy. No foraminal or canal stenosis.   L5-S1: Shallow central disc protrusion. Mild bilateral facet hypertrophy. No foraminal or canal stenosis.    IMPRESSION: Mild degenerative changes of the lumbar spine without foraminal or canal stenosis at any level.     Electronically Signed   By: Duanne Guess D.O.   On: 05/02/2021 10:26  Imaging: No results found.   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   Fatty liver    GERD (gastroesophageal reflux  disease)    H/O hiatal hernia    Headache    HLD (hyperlipidemia)    Hypertension    Pneumonia    PONV (postoperative nausea and vomiting)    SOB (shortness of breath)     Family History  Problem Relation Age of Onset   Heart attack Mother    Diabetes Mother    Hyperlipidemia Mother    Hypertension Mother    Stroke Sister    Pulmonary embolism Father    Clotting disorder Father    Diabetes Sister    Kidney disease Sister        kidney failure   Heart disease Sister    Cancer Sister        abdominal   Diabetes Son 27       TYPE 1   Kidney  cancer Sister    Crohn's disease Niece    Cancer Nephew        gallbladder   Stomach cancer Neg Hx    Rectal cancer Neg Hx    Esophageal cancer Neg Hx    Colon cancer Neg Hx     Past Surgical History:  Procedure Laterality Date   BREAST LUMPECTOMY     4 lumps -- two from right -- one from left   LAPAROSCOPIC ASSISTED VAGINAL HYSTERECTOMY N/A 09/18/2012   Procedure: LAPAROSCOPIC ASSISTED VAGINAL HYSTERECTOMY;  Surgeon: Mitchel Honour, DO;  Location: [facility];  Service: Gynecology;  Laterality: N/A;  BLADDER  DISTENTION   TOTAL HIP ARTHROPLASTY Left 11/10/2018   Procedure: LEFT TOTAL HIP ARTHROPLASTY ANTERIOR APPROACH;  Surgeon: Tarry Kos, MD;  Location: [facility];  Service: Orthopedics;  Laterality: Left;   TUBAL LIGATION     Social History   Occupational History   Occupation: Designer, industrial/product: Orthoptist    Comment: [employer] - now works from home  Tobacco Use   Smoking status: Never   Smokeless tobacco: Never  Vaping Use   Vaping status: Never Used  Substance and Sexual Activity   Alcohol use: No   Drug use: No   Sexual activity: Not Currently

## 2023-05-21 ENCOUNTER — Ambulatory Visit (INDEPENDENT_AMBULATORY_CARE_PROVIDER_SITE_OTHER): Payer: No Typology Code available for payment source | Admitting: Orthopaedic Surgery

## 2023-05-21 DIAGNOSIS — M25552 Pain in left hip: Secondary | ICD-10-CM | POA: Diagnosis not present

## 2023-05-21 DIAGNOSIS — M25551 Pain in right hip: Secondary | ICD-10-CM | POA: Diagnosis not present

## 2023-05-23 LAB — ANA: Anti Nuclear Antibody (ANA): POSITIVE — AB

## 2023-05-23 LAB — RHEUMATOID FACTOR: Rheumatoid fact SerPl-aCnc: 10 [IU]/mL (ref <14)

## 2023-05-23 LAB — SEDIMENTATION RATE: Sed Rate: 17 mm/h (ref 0–30)

## 2023-05-23 LAB — ANTI-NUCLEAR AB-TITER (ANA TITER): ANA Titer 1: 1:40 {titer} — ABNORMAL HIGH

## 2023-05-23 LAB — URIC ACID: Uric Acid, Serum: 8.3 mg/dL — ABNORMAL HIGH (ref 2.5–7.0)

## 2023-05-24 ENCOUNTER — Telehealth: Payer: Self-pay | Admitting: Orthopaedic Surgery

## 2023-05-24 NOTE — Progress Notes (Signed)
Spoke with patient.  She will follow up with her PCP.

## 2023-05-24 NOTE — Progress Notes (Signed)
Need to see her PCP about this recent lab work.

## 2023-05-24 NOTE — Progress Notes (Signed)
Tried to call patient. No answer.

## 2023-05-24 NOTE — Telephone Encounter (Signed)
Patient returning a call to Lauren.

## 2023-05-24 NOTE — Telephone Encounter (Signed)
Spoke with patient regarding labs. She will touch base with her PCP.

## 2023-05-24 NOTE — Progress Notes (Signed)
Tried to call patient about labwork. No answer. LMOM for patient to call me back.

## 2023-06-03 ENCOUNTER — Telehealth: Payer: Self-pay

## 2023-06-03 NOTE — Telephone Encounter (Signed)
[facility] would like patients lab results faxed to [phone].  Please advise.  Thank you.

## 2023-06-03 NOTE — Telephone Encounter (Signed)
Faxed

## 2023-06-04 ENCOUNTER — Telehealth: Payer: No Typology Code available for payment source | Admitting: Family Medicine

## 2023-06-04 DIAGNOSIS — J029 Acute pharyngitis, unspecified: Secondary | ICD-10-CM

## 2023-06-04 NOTE — Progress Notes (Signed)

## 2023-06-11 ENCOUNTER — Encounter: Payer: Self-pay | Admitting: Sports Medicine

## 2023-06-11 ENCOUNTER — Telehealth: Payer: No Typology Code available for payment source | Admitting: Physician Assistant

## 2023-06-11 ENCOUNTER — Ambulatory Visit: Payer: No Typology Code available for payment source | Admitting: Sports Medicine

## 2023-06-11 ENCOUNTER — Other Ambulatory Visit: Payer: Self-pay

## 2023-06-11 DIAGNOSIS — G8929 Other chronic pain: Secondary | ICD-10-CM

## 2023-06-11 DIAGNOSIS — M25551 Pain in right hip: Secondary | ICD-10-CM

## 2023-06-11 DIAGNOSIS — J019 Acute sinusitis, unspecified: Secondary | ICD-10-CM | POA: Diagnosis not present

## 2023-06-11 DIAGNOSIS — B9689 Other specified bacterial agents as the cause of diseases classified elsewhere: Secondary | ICD-10-CM

## 2023-06-11 DIAGNOSIS — S73191A Other sprain of right hip, initial encounter: Secondary | ICD-10-CM | POA: Diagnosis not present

## 2023-06-11 MED ORDER — AMOXICILLIN-POT CLAVULANATE 875-125 MG PO TABS: 1.0000 | ORAL_TABLET | Freq: Two times a day (BID) | ORAL | 0 refills | Status: DC

## 2023-06-11 MED ORDER — LIDOCAINE HCL 1 % IJ SOLN
4.0000 mL | INTRAMUSCULAR | Status: AC | PRN
  Administered 2023-06-11: 4 mL

## 2023-06-11 MED ORDER — METHYLPREDNISOLONE ACETATE 40 MG/ML IJ SUSP
40.0000 mg | INTRAMUSCULAR | Status: AC | PRN
  Administered 2023-06-11: 40 mg via INTRA_ARTICULAR

## 2023-06-11 NOTE — Progress Notes (Signed)

## 2023-06-11 NOTE — Progress Notes (Signed)
   Procedure Note  Patient: Angelica Sharp             Date of Birth: 10-Mar-1966           MRN: [id number]             Visit Date: 06/11/2023  I did check with Maricella today, she does have type 2 diabetes but this is well-controlled.  Her last A1c was 5.8.  She is managed on metformin  and Ozempic.  Procedures: Visit Diagnoses:  1. Chronic right hip pain   2. Tear of right acetabular labrum, initial encounter    Large Joint Inj: R hip joint on 06/11/2023 3:01 PM Indications: pain Details: 22 G 3.5 in needle, ultrasound-guided anterior approach Medications: 4 mL lidocaine  1 %; 40 mg methylPREDNISolone  acetate 40 MG/ML Outcome: tolerated well, no immediate complications  Procedure: US -guided intra-articular hip injection, right After discussion on risks/benefits/indications and informed verbal consent was obtained, a timeout was performed. Patient was lying supine on exam table. The hip was cleaned with betadine  and alcohol swabs. Then utilizing ultrasound guidance, the patient's femoral head and neck junction was identified and subsequently injected with 4:1 lidocaine :depomedrol via an in-plane approach with ultrasound visualization of the injectate administered into the hip joint. Patient tolerated procedure well without immediate complications.  Procedure, treatment alternatives, risks and benefits explained, specific risks discussed. Consent was given by the patient. Immediately prior to procedure a time out was called to verify the correct patient, procedure, equipment, support staff and site/side marked as required. Patient was prepped and draped in the usual sterile fashion.     - follow-up with Dr. Jerri as indicated; I am happy to see them as needed  Lonell Sprang, DO Primary Care Sports Medicine Physician  [facility] - Orthopedics  This note was dictated using Dragon naturally speaking software and may contain errors in syntax, spelling, or content which have not been  identified prior to signing this note.

## 2023-07-07 ENCOUNTER — Encounter: Payer: Self-pay | Admitting: Sports Medicine

## 2023-07-11 ENCOUNTER — Encounter: Payer: Self-pay | Admitting: Cardiology

## 2023-07-11 ENCOUNTER — Ambulatory Visit: Payer: No Typology Code available for payment source | Attending: Cardiology | Admitting: Cardiology

## 2023-07-11 VITALS — BP 112/70 | HR 93 | Ht 67.0 in | Wt 194.0 lb

## 2023-07-11 DIAGNOSIS — R0789 Other chest pain: Secondary | ICD-10-CM | POA: Diagnosis not present

## 2023-07-11 DIAGNOSIS — I10 Essential (primary) hypertension: Secondary | ICD-10-CM | POA: Diagnosis not present

## 2023-07-11 DIAGNOSIS — E782 Mixed hyperlipidemia: Secondary | ICD-10-CM

## 2023-07-11 NOTE — Progress Notes (Signed)
Cardiology Office Note    Date:  07/12/2023  ID:  Angelica Sharp, DOB 10-20-1965, MRN [id number] PCP:  Sherlie Ban, NP  Cardiologist:  Nanetta Batty, MD  Electrophysiologist:  None   Chief Complaint: Chest and back pain   History of Present Illness: .    Angelica Sharp is a 58 y.o. female with visit-pertinent history of atypical chest pain, palpitations, mild dilation of the ascending aorta, hypertension, hyperlipidemia, type 2 diabetes and GERD.  In 2021 she was initially evaluated by Dr. Allyson Sabal in setting of chest pain, palpitations.  Echocardiogram at the time was essentially normal but did show mild dilation of the ascending aorta measuring 40 mm.  Coronary calcium score was 0, no evidence of CAD.  Cardiac monitor in 12/2019 showed occasional PVCs and couplets with short run of SVT, reported to resolve with iron repletion.  Echo on 01/2021 indicated LVEF of 55 to 60%, no significant valvular abnormalities, mild dilation of the ascending aorta measuring 40 mm, unchanged from prior study.  In 08/2022 she notified the office with complaints of a left-sided chest pain, pain between her shoulder blades.  She was seen on 09/21/2022 with a several week history of pain between her shoulders, worse at night and increased shortness of breath.  Coronary CTA on 10/01/2022 indicated a coronary calcium score of 0.  Echocardiogram on 10/17/2022 indicated LVEF of 55 to 60%, no RWMA, diastolic parameters were normal, RV systolic size and function was normal.  There was normal pulmonary artery pressures.  No significant valvular abnormalities.  Aortic dilation was noted at 38 mm.  She was last seen in clinic on 11/07/2022 by Azalee Course, PA for follow-up.  She denied any significant chest pain.  She reported that she continued to have intermittent back pain especially after doing heavy activity.  Felt to be more musculoskeletal in nature.  She noted increased shortness of breath, D-dimer was checked and was  negative.  Today she presents regarding an episode of chest and back pain on Friday morning that woke her from sleep, described as something sitting on her chest, lasting for over an hour. She also reports constant back pain between her shoulder blades. She denies any further episodes of chest pain similar to the occurrence Friday morning, notes she has some tightness she feels is related to anxiety. Her PCP started her on hydroxyzine yesterday, she has not noted significant improvement yet. She denies shortness of breath, lower extremity edema, palpitations, orthopnea or pnd.   ROS: .   Today she denies shortness of breath, lower extremity edema, fatigue, palpitations, melena, hematuria, hemoptysis, diaphoresis, weakness, presyncope, syncope, orthopnea, and PND.  All other systems are reviewed and otherwise negative. Studies Reviewed: Marland Kitchen    EKG:  EKG is ordered today, personally reviewed, demonstrating EKG Interpretation Date/Time:  Thursday July 11 2023 15:57:26 EST Ventricular Rate:  95 PR Interval:  114 QRS Duration:  96 QT Interval:  368 QTC Calculation: 462 R Axis:   -42  Text Interpretation: Normal sinus rhythm Left axis deviation Moderate voltage criteria for LVH, may be normal variant ( R in aVL , Cornell product ) When compared with ECG of 30-Oct-2018 13:48, No significant change was found Confirmed by Reather Littler [phone]) on 07/11/2023 4:37:02 PM    CV Studies:  Cardiac Studies & Procedures      ECHOCARDIOGRAM  ECHOCARDIOGRAM COMPLETE 10/17/2022  Narrative ECHOCARDIOGRAM REPORT    Patient Name:   Angelica Sharp Date of Exam: 10/17/2022 Medical Rec #:  [id number]  Height:       67.0 in Accession #:    [id number]    Weight:       195.0 lb Date of Birth:  Jul 08, 1965    BSA:          2.001 m Patient Age:    56 years      BP:           98/57 mmHg Patient Gender: F             HR:           69 bpm. Exam Location:  [address]  Procedure: 2D Echo, Cardiac Doppler, Color  Doppler and Strain Analysis  Indications:    Thoracic aortic aneurysm (TAA) (HCC) [[id number]]  History:        Patient has prior history of Echocardiogram examinations, most recent 01/31/2021. Signs/Symptoms:Dyspnea, Chest Pain and Shortness of Breath; Risk Factors:Diabetes, Hypertension, Dyslipidemia and GERD. Thoracic Aortic Aneurysm.  Sonographer:    Eulah Pont RDCS Referring Phys: [phone] EMILY C MONGE   Sonographer Comments: Global longitudinal strain was attempted. IMPRESSIONS   1. Left ventricular ejection fraction, by estimation, is 55 to 60%. The left ventricle has normal function. The left ventricle has no regional wall motion abnormalities. Left ventricular diastolic parameters were normal. The average left ventricular global longitudinal strain is -22.5 %. 2. Right ventricular systolic function is normal. The right ventricular size is normal. There is normal pulmonary artery systolic pressure. The estimated right ventricular systolic pressure is 31.5 mmHg. 3. The mitral valve is normal in structure. Trivial mitral valve regurgitation. No evidence of mitral stenosis. 4. The aortic valve was not well visualized. Aortic valve regurgitation is not visualized. No aortic stenosis is present. 5. Aortic dilatation noted. There is mild dilatation of the ascending aorta, measuring 38 mm. 6. The inferior vena cava is normal in size with greater than 50% respiratory variability, suggesting right atrial pressure of 3 mmHg.  FINDINGS Left Ventricle: Left ventricular ejection fraction, by estimation, is 55 to 60%. The left ventricle has normal function. The left ventricle has no regional wall motion abnormalities. The average left ventricular global longitudinal strain is -22.5 %. The left ventricular internal cavity size was normal in size. There is no left ventricular hypertrophy. Left ventricular diastolic parameters were normal.  Right Ventricle: The right ventricular size is normal. No  increase in right ventricular wall thickness. Right ventricular systolic function is normal. There is normal pulmonary artery systolic pressure. The tricuspid regurgitant velocity is 2.67 m/s, and with an assumed right atrial pressure of 3 mmHg, the estimated right ventricular systolic pressure is 31.5 mmHg.  Left Atrium: Left atrial size was normal in size.  Right Atrium: Right atrial size was normal in size.  Pericardium: There is no evidence of pericardial effusion.  Mitral Valve: The mitral valve is normal in structure. Trivial mitral valve regurgitation. No evidence of mitral valve stenosis.  Tricuspid Valve: The tricuspid valve is normal in structure. Tricuspid valve regurgitation is not demonstrated.  Aortic Valve: The aortic valve was not well visualized. Aortic valve regurgitation is not visualized. No aortic stenosis is present.  Pulmonic Valve: The pulmonic valve was not well visualized. Pulmonic valve regurgitation is trivial.  Aorta: Aortic dilatation noted and the aortic root is normal in size and structure. There is mild dilatation of the ascending aorta, measuring 38 mm.  Venous: The inferior vena cava is normal in size with greater than 50% respiratory variability, suggesting right atrial pressure of 3 mmHg.  IAS/Shunts: The interatrial septum was not well visualized.   LEFT VENTRICLE PLAX 2D LVIDd:         4.60 cm     Diastology LVIDs:         3.20 cm     LV e' medial:    8.70 cm/s LV PW:         0.90 cm     LV E/e' medial:  11.3 LV IVS:        0.90 cm     LV e' lateral:   9.46 cm/s LVOT diam:     2.10 cm     LV E/e' lateral: 10.4 LV SV:         70 LV SV Index:   35          2D Longitudinal Strain LVOT Area:     3.46 cm    2D Strain GLS Avg:     -22.5 %  LV Volumes (MOD) LV vol d, MOD A2C: 67.6 ml LV vol d, MOD A4C: 92.2 ml LV vol s, MOD A2C: 31.0 ml LV vol s, MOD A4C: 42.5 ml LV SV MOD A2C:     36.6 ml LV SV MOD A4C:     92.2 ml LV SV MOD BP:      42.4  ml  RIGHT VENTRICLE RV S prime:     15.30 cm/s TAPSE (M-mode): 1.8 cm  LEFT ATRIUM             Index        RIGHT ATRIUM           Index LA diam:        3.80 cm 1.90 cm/m   RA Area:     10.40 cm LA Vol (A2C):   28.2 ml 14.10 ml/m  RA Volume:   20.00 ml  10.00 ml/m LA Vol (A4C):   33.6 ml 16.79 ml/m LA Biplane Vol: 32.2 ml 16.10 ml/m AORTIC VALVE             PULMONIC VALVE LVOT Vmax:   96.70 cm/s  PR End Diast Vel: 3.49 msec LVOT Vmean:  65.500 cm/s LVOT VTI:    0.202 m  AORTA Ao Root diam: 3.20 cm Ao Asc diam:  3.75 cm  MITRAL VALVE               TRICUSPID VALVE MV Area (PHT): 3.42 cm    TR Peak grad:   28.5 mmHg MV Decel Time: 222 msec    TR Vmax:        267.00 cm/s MV E velocity: 98.10 cm/s MV A velocity: 66.90 cm/s  SHUNTS MV E/A ratio:  1.47        Systemic VTI:  0.20 m Systemic Diam: 2.10 cm  Epifanio Lesches MD Electronically signed by Epifanio Lesches MD Signature Date/Time: 10/17/2022/7:30:32 PM    Final   MONITORS  LONG TERM MONITOR (3-14 DAYS) 01/13/2020  Narrative 1: Sinus rhythm/sinus bradycardia/sinus tachycardia 2: Occasional PVCs and couplets 3: Short runs of SVT  CT SCANS  CT CORONARY MORPH W/CTA COR W/SCORE 10/01/2022  Addendum 10/06/2022 12:22 AM ADDENDUM REPORT: 10/06/2022 00:19  EXAM: OVER-READ INTERPRETATION  CT CHEST  The following report is an over-read performed by radiologist Dr. Alcide Clever of [facility] on 10/06/2022. This over-read does not include interpretation of cardiac or coronary anatomy or pathology. The coronary calcium score/coronary CTA interpretation by the cardiologist is attached.  COMPARISON:  None.  FINDINGS: Cardiovascular: Ectasias of  the ascending aorta 3.8 cm is noted. No dissection is seen. No significant atherosclerotic calcifications are noted. No pulmonary emboli are noted.  Mediastinum/Nodes: There are no enlarged lymph nodes within the visualized mediastinum.  Lungs/Pleura:  There is no pleural effusion. The visualized lungs appear clear.  Upper abdomen: No significant findings in the visualized upper abdomen.  Musculoskeletal/Chest wall: No chest wall mass or suspicious osseous findings within the visualized chest.  IMPRESSION: No significant extracardiac findings within the visualized chest.   Electronically Signed By: Alcide Clever M.D. On: 10/06/2022 00:19  Narrative CLINICAL DATA:  1F with hypertension, hyperlipidemia, diabetes and chest pain.  EXAM: Cardiac/Coronary  CT  TECHNIQUE: The patient was scanned on a [employer].  FINDINGS: A 120 kV prospective scan was triggered in the descending thoracic aorta at 111 HU's. Axial non-contrast 3 mm slices were carried out through the heart. The data set was analyzed on a dedicated work station and scored using the Agatson method. Gantry rotation speed was 250 msecs and collimation was .6 mm. No beta blockade and 0.8 mg of sl NTG was given. The 3D data set was reconstructed in 5% intervals of the 67-82 % of the R-R cycle. Diastolic phases were analyzed on a dedicated work station using MPR, MIP and VRT modes. The patient received 80 cc of contrast.  Aorta: Normal size. Ascending aorta 3.8 cm. Mild atherosclerosis of the aortic root. No dissection.  Aortic Valve:  Trileaflet.  No calcifications.  Coronary Arteries:  Normal coronary origin.  Right dominance.  RCA is a large dominant artery that gives rise to PDA and PLVB. There is no plaque.  Left main is a large artery that gives rise to LAD, RI, and LCX arteries.  LAD is a large vessel that has no plaque.  D1 has no plaque.  RI has no plaque.  LCX is a non-dominant artery that gives rise to one large OM1 branch. There is no plaque.  Coronary Calcium Score: 0  Other findings:  Normal pulmonary vein drainage into the left atrium.  Normal let atrial appendage without a thrombus.  Normal size of the pulmonary  artery.  Small PFO  IMPRESSION: 1. Coronary calcium score of 0. This was 1st percentile for age-, sex, and race-matched controls.  2. Total plaque volume 0 mm3 which is 1st percentile for age- and sex-matched controls.  2. Normal coronary origin with right dominance.  3. Normal coronary arteries.  4.  Small PFO.  RECOMMENDATIONS: 1. CAD-RADS 0: No evidence of CAD (0%). Consider non-atherosclerotic causes of chest pain.  2. CAD-RADS 1: Minimal non-obstructive CAD (0-24%). Consider non-atherosclerotic causes of chest pain. Consider preventive therapy and risk factor modification.  3. CAD-RADS 2: Mild non-obstructive CAD (25-49%). Consider non-atherosclerotic causes of chest pain. Consider preventive therapy and risk factor modification.  4. CAD-RADS 3: Moderate stenosis. Consider symptom-guided anti-ischemic pharmacotherapy as well as risk factor modification per guideline directed care. Additional analysis with CT FFR will be submitted.  5. CAD-RADS 4: Severe stenosis. (70-99% or > 50% left main). Cardiac catheterization or CT FFR is recommended. Consider symptom-guided anti-ischemic pharmacotherapy as well as risk factor modification per guideline directed care. Invasive coronary angiography recommended with revascularization per published guideline statements.  6. CAD-RADS 5: Total coronary occlusion (100%). Consider cardiac catheterization or viability assessment. Consider symptom-guided anti-ischemic pharmacotherapy as well as risk factor modification per guideline directed care.  7. CAD-RADS N: Non-diagnostic study. Obstructive CAD can't be excluded. Alternative evaluation is recommended.  Chilton Si, MD  Electronically Signed: By: Chilton Si M.D. On: 10/02/2022 11:40   CT SCANS  CT CARDIAC SCORING (SELF PAY ONLY) 12/29/2019  Addendum 01/03/2020  8:13 PM ADDENDUM REPORT: 01/03/2020 20:10  CLINICAL DATA:  Risk  stratification  EXAM: Coronary Calcium Score  TECHNIQUE: The patient was scanned on a [employer] scanner. Axial non-contrast 3 mm slices were carried out through the heart. The data set was analyzed on a dedicated work station and scored using the Agatson method.  FINDINGS: Non-cardiac: See separate report from [facility].  Ascending Aorta: Normal caliber.  No calcifications.  Pericardium: Normal  Coronary arteries: Normal coronary origins.  IMPRESSION: Coronary calcium score of 0. This was 0 percentile for age and sex matched control.  Armanda Magic   Electronically Signed By: Armanda Magic On: 01/03/2020 20:10  Narrative EXAM: OVER-READ INTERPRETATION  CT CHEST  The following report is an over-read performed by radiologist Dr. Trudie Reed of [facility] on 12/29/2019. This over-read does not include interpretation of cardiac or coronary anatomy or pathology. The coronary calcium score interpretation by the cardiologist is attached.  COMPARISON:  None.  FINDINGS: Tiny calcified granuloma in the periphery of the right lung. Within the visualized portions of the thorax there are no suspicious appearing pulmonary nodules or masses, there is no acute consolidative airspace disease, no pleural effusions, no pneumothorax and no lymphadenopathy. Visualized portions of the upper abdomen are unremarkable. There are no aggressive appearing lytic or blastic lesions noted in the visualized portions of the skeleton.  IMPRESSION: No significant incidental noncardiac findings are noted.  Electronically Signed: By: Trudie Reed M.D. On: 12/29/2019 14:43           Current Reported Medications:.    Current Meds  Medication Sig   amoxicillin (AMOXIL) 500 MG tablet Take 500 mg by mouth 3 (three) times daily.   benzonatate (TESSALON) 200 MG capsule Take 1 capsule (200 mg total) by mouth 2 (two) times daily as needed for cough.   Calcium  Carb-Cholecalciferol (CALCIUM+D3 PO) Take 1 tablet by mouth daily.   Ferrous Sulfate (IRON PO) Take by mouth.   glucose blood test strip Use as instructed   hydrochlorothiazide (HYDRODIURIL) 25 MG tablet Take 1 tablet by mouth once daily   hydrOXYzine (ATARAX) 25 MG tablet Take 25 mg by mouth 2 (two) times daily.   Melatonin 5 MG CAPS Take 1 capsule by mouth at bedtime.   metFORMIN (GLUCOPHAGE) 500 MG tablet Take 1 tablet by mouth in the morning, at noon, and at bedtime.   omeprazole (PRILOSEC) 40 MG capsule Take 40 mg by mouth daily.   oxybutynin (DITROPAN) 5 MG tablet Take 5 mg by mouth daily.   Potassium 99 MG TABS Take 99-198 mg by mouth See admin instructions. Take 198 mg in the morning and 99 mg at night   pravastatin (PRAVACHOL) 40 MG tablet TAKE 1 TABLET BY MOUTH AT BEDTIME   Semaglutide (OZEMPIC, 0.25 OR 0.5 MG/DOSE, Florien)    Physical Exam:    VS:  BP 112/70 (BP Location: Left Arm, Patient Position: Sitting, Cuff Size: Normal)   Pulse 93   Ht 5\' 7"  (1.702 m)   Wt 194 lb (88 kg)   LMP 08/10/2012 Comment: ovaries remain//a.c.  SpO2 97%   BMI 30.38 kg/m    Wt Readings from Last 3 Encounters:  07/11/23 194 lb (88 kg)  11/07/22 200 lb 9.6 oz (91 kg)  09/28/22 195 lb (88.5 kg)    GEN: Well nourished, well  developed in no acute distress NECK: No JVD; No carotid bruits CARDIAC: RRR, no murmurs, rubs, gallops RESPIRATORY:  Clear to auscultation without rales, wheezing or rhonchi  ABDOMEN: Soft, non-tender, non-distended EXTREMITIES:  No edema; No acute deformity   Asessement and Plan:.    Chest pain/back pain: Coronary CTA on 10/01/2022 indicated a coronary calcium score of 0.  Echocardiogram on 10/17/2022 indicated LVEF of 55 to 60%, no RWMA, diastolic parameters were normal, RV systolic size and function was normal.  There was normal pulmonary artery pressures.  No significant valvular abnormalities.  Aortic dilation was noted at 38 mm.  Today she presents for one episode of chest  discomfort that woke her from sleep Friday morning, described as pressure, something sitting on her chest.  She also endorses constant back pain between her shoulder blades, this is improved with heat and specific movement.  She also notes that she has a tear in her rotator cuff of the right shoulder, questions that this is likely related.  She has noted some intermittent chest tightness however nothing similar to chest discomfort that woke her from sleep, she feels that this is related to anxiety.  EKG today with no changes compared to prior tracings.  She denies any chest pain or shortness of breath with exertion.  Given less than a year since her last coronary CTA with a coronary calcium score of 0 do not feel that this is related to CAD.  Encouraged patient to continue to monitor for discomfort with exertion.  Patient will notify the office of recurrence. Reviewed ED precautions. Heart healthy diet and regular cardiovascular exercise encouraged.    Hypertension: Blood pressure today well-controlled at 112/70.  On hydrochlorothiazide.  Hyperlipidemia: On pravastatin. Monitored and managed per PCP.     Disposition: F/u with Dr. Allyson Sabal in 10/2023 as scheduled.   Signed, Rip Harbour, NP

## 2023-07-11 NOTE — Telephone Encounter (Signed)
Patient identification verified by 2 forms. Marilynn Rail, RN    Called and spoke to patient  Patient states:   -she has constant pressure between shoulder blade and back   -chest pressure is not constant   -last Friday night she woke up with chest pressure and back pressure   -saw PCP, PCP is concerned about EKG (attached in message)   -PCP is recommending further cardiac workup including cardiac CT   -PCP recommend possible repeat of Echo   -would like input/advisement from Dr. Allyson Sabal before completing any testing   -at time of this call she is still having back pressure   -she is currently at work, unable to leave work until [employer]  Patient denies at time of call:   -chest pain/pressure   -SOB/difficulty breathing  RN voiced concern regarding constant pressure she is experiencing between her shoulder blades. Advised her to present to ED. Patient declined  Patient scheduled for OV 1/30 at 3:35pm  Informed Patient RN will speak to provider in office and return call regarding presenting to OV today

## 2023-07-11 NOTE — Patient Instructions (Signed)
Medication Instructions:  NO CHANGES *If you need a refill on your cardiac medications before your next appointment, please call your pharmacy*   Lab Work: NO LABS If you have labs (blood work) drawn today and your tests are completely normal, you will receive your results only by: MyChart Message (if you have MyChart) OR A paper copy in the mail If you have any lab test that is abnormal or we need to change your treatment, we will call you to review the results.   Testing/Procedures: NO TESTING   Follow-Up: At [facility], you and your health needs are our priority.  As part of our continuing mission to provide you with exceptional heart care, we have created designated Provider Care Teams.  These Care Teams include your primary Cardiologist (physician) and Advanced Practice Providers (APPs -  Physician Assistants and Nurse Practitioners) who all work together to provide you with the care you need, when you need it.   Your next appointment:   4 month(s)  Provider:   Nanetta Batty, MD   Other Instructions

## 2023-07-11 NOTE — Telephone Encounter (Signed)
Patient identification verified by 2 forms. Marilynn Rail, RN    Called and spoke to patient  Patient states:   -back pressure relieved with heating pad and with counter pressure  Informed patient:   -Per DOD Dr. Servando Salina okay to keep 1/30 OV at 3:35pm, if escalation of care needed can transfer to ED   -if develops chest pressure/pain prior to appointment present to ED  Patient verbalized understanding, no questions at this time

## 2023-07-12 ENCOUNTER — Encounter: Payer: Self-pay | Admitting: Cardiology

## 2023-07-19 ENCOUNTER — Ambulatory Visit: Payer: No Typology Code available for payment source | Admitting: Orthopaedic Surgery

## 2023-07-19 DIAGNOSIS — G8929 Other chronic pain: Secondary | ICD-10-CM

## 2023-07-19 DIAGNOSIS — M25511 Pain in right shoulder: Secondary | ICD-10-CM | POA: Diagnosis not present

## 2023-07-19 MED ORDER — TRAMADOL HCL 50 MG PO TABS: 50.0000 mg | ORAL_TABLET | Freq: Two times a day (BID) | ORAL | 2 refills | Status: DC | PRN

## 2023-07-19 NOTE — Progress Notes (Signed)
 Office Visit Note   Patient: Angelica Sharp           Date of Birth: June 14, 1965           MRN: [id number] Visit Date: 07/19/2023              Requested by: Duwaine Burnard Amble, NP [phone] [address] [place],  [place] [address] PCP: Duwaine Burnard Amble, NP   Assessment & Plan: Visit Diagnoses:  1. Chronic right shoulder pain     Plan: Impression is chronic right shoulder pain likely symptomatic from the underlying supraspinatus tendinosis/tear.  We have discussed referral to Dr. Eldonna for fluorosopci glenohumeral cortisone injection.  If her symptoms fail to improve she will follow-up with us  to discuss shoulder arthroscopy.  Otherwise, follow-up as needed.  Follow-Up Instructions: Return if symptoms worsen or fail to improve.   Orders:  No orders of the defined types were placed in this encounter.  Meds ordered this encounter  Medications   traMADol  (ULTRAM ) 50 MG tablet    Sig: Take 1-2 tablets (50-100 mg total) by mouth every 12 (twelve) hours as needed.    Dispense:  30 tablet    Refill:  2      Procedures: No procedures performed   Clinical Data: No additional findings.   Subjective: Chief Complaint  Patient presents with   Right Shoulder - Follow-up    HPI patient is a pleasant 58 year old female who comes in today with chronic right shoulder pain.  Symptoms have been ongoing for several months.  She has been seen by Dr. Burnetta for this several times in the past.  The pain she is having is primarily to the deltoid.  Pain is worse when she is throwing something such as a ball as well as when she is bringing her arm across her body.  She notes occasional feelings of the shoulder locking up as well as weakness.  She has tried over-the-counter medication without relief.  Dr. Burnetta is performed [facility] joint injection as well as subacromial cortisone injection without any significant relief.  Patient has tried a physician guided exercise program without relief.  Subsequent MRI of the  right shoulder was obtained which showed a small partial-thickness articular surface tear of the supraspinatus.  Review of Systems as detailed in HPI.  All others reviewed and are negative.   Objective: Vital Signs: LMP 08/10/2012 Comment: ovaries remain//a.c.  Physical Exam well-developed well-nourished female no acute distress.  Alert and oriented x 3.  Ortho Exam right shoulder exam: Forward flexion to approximately 170 degrees.  External rotation to 50 degrees.  Internal rotation to L5.  She has pain with empty can testing and speeds testing.  4 out of 5 strength.  She is neurovascularly intact distally.  Specialty Comments:  MRI LUMBAR SPINE WITHOUT CONTRAST   TECHNIQUE: Multiplanar, multisequence MR imaging of the lumbar spine was performed. No intravenous contrast was administered.   COMPARISON:  X-ray lumbar 03/31/2021.   FINDINGS: Segmentation:  Standard.   Alignment:  Physiologic.   Vertebrae: No fracture. No evidence of discitis. No suspicious bone lesion. 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 There are a few additional smaller intraosseous hemangiomas present.   Conus medullaris and cauda equina: Conus extends to the L2 level. Conus and cauda equina appear normal.   Paraspinal and other soft tissues: Left renal cyst. No acute findings.   Disc levels:   T12-L1: Tiny shallow disc protrusion. No foraminal or canal stenosis.  L1-L2: Unremarkable.   L2-L3: Unremarkable.   L3-L4: Unremarkable disc. Mild bilateral facet hypertrophy. No foraminal or canal stenosis.   L4-L5: Unremarkable disc. Mild bilateral facet hypertrophy. No foraminal or canal stenosis.   L5-S1: Shallow central disc protrusion. Mild bilateral facet hypertrophy. No foraminal or canal stenosis.   IMPRESSION: Mild degenerative changes of the lumbar spine without foraminal or canal stenosis at any level.     Electronically Signed   By: Mabel Converse D.O.   On: 05/02/2021 10:26  Imaging: No results found.   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   Fatty liver    GERD (gastroesophageal reflux disease)    H/O hiatal hernia    Headache    HLD (hyperlipidemia)    Hypertension    Pneumonia    PONV (postoperative nausea and vomiting)    SOB (shortness of breath)     Family History  Problem Relation Age of Onset   Heart attack Mother    Diabetes Mother    Hyperlipidemia Mother    Hypertension Mother    Stroke Sister    Pulmonary embolism Father    Clotting disorder Father    Diabetes Sister    Kidney disease Sister        kidney failure   Heart disease Sister    Cancer Sister        abdominal   Diabetes Son 27       TYPE 1   Kidney cancer Sister    Crohn's disease Niece    Cancer Nephew        gallbladder   Stomach cancer Neg Hx    Rectal cancer Neg Hx    Esophageal cancer Neg Hx     Colon cancer Neg Hx     Past Surgical History:  Procedure Laterality Date   BREAST LUMPECTOMY     4 lumps -- two from right -- one from left   LAPAROSCOPIC ASSISTED VAGINAL HYSTERECTOMY N/A 09/18/2012   Procedure: LAPAROSCOPIC ASSISTED VAGINAL HYSTERECTOMY;  Surgeon: Duwaine Blumenthal, DO;  Location: [facility];  Service: Gynecology;  Laterality: N/A;  BLADDER  DISTENTION   TOTAL HIP ARTHROPLASTY Left 11/10/2018   Procedure: LEFT TOTAL HIP ARTHROPLASTY ANTERIOR APPROACH;  Surgeon: Jerri Kay HERO, MD;  Location: [facility];  Service: Orthopedics;  Laterality: Left;   TUBAL LIGATION  Social History   Occupational History   Occupation: Designer, Industrial/product: ORTHOPTIST    Comment: [employer] - now works from home  Tobacco Use   Smoking status: Never   Smokeless tobacco: Never  Vaping Use   Vaping status: Never Used  Substance and Sexual Activity   Alcohol use: No   Drug use: No   Sexual activity: Not Currently

## 2023-07-24 ENCOUNTER — Encounter: Payer: Self-pay | Admitting: Orthopaedic Surgery

## 2023-07-24 ENCOUNTER — Other Ambulatory Visit: Payer: Self-pay

## 2023-07-24 ENCOUNTER — Other Ambulatory Visit: Payer: Self-pay | Admitting: Physician Assistant

## 2023-07-24 ENCOUNTER — Ambulatory Visit: Payer: No Typology Code available for payment source | Admitting: Physical Medicine and Rehabilitation

## 2023-07-24 DIAGNOSIS — G8929 Other chronic pain: Secondary | ICD-10-CM

## 2023-07-24 DIAGNOSIS — M25511 Pain in right shoulder: Secondary | ICD-10-CM | POA: Diagnosis not present

## 2023-07-24 MED ORDER — HYDROCODONE-ACETAMINOPHEN 10-325 MG PO TABS: 1.0000 | ORAL_TABLET | Freq: Every day | ORAL | 0 refills | Status: DC | PRN

## 2023-07-24 NOTE — Progress Notes (Signed)
 Pain Scale-8 No Blood Thinners No Allergies to Contrast Dyes

## 2023-07-24 NOTE — Progress Notes (Signed)
 Angelica Sharp - 58 y.o. female MRN [id number]  Date of birth: Apr 17, 1966  Office Visit Note: Visit Date: 07/24/2023 PCP: Sherlie Ban, NP Referred by: Tarry Kos, MD  Subjective: Chief Complaint  Patient presents with   Right Shoulder - Pain   HPI:  Angelica Sharp is a 58 y.o. female who comes in today at the request of Dr. Glee Arvin for planned Right anesthetic glenohumeral arthrogram with fluoroscopic guidance.  The patient has failed conservative care including home exercise, medications, time and activity modification.  This injection will be diagnostic and hopefully therapeutic.  Please see requesting physician notes for further details and justification.  She has pain with forward flexion abduction on the right with some referral into the upper lateral arm.  She has had injections with ultrasound guidance in the past with some relief.   ROS Otherwise per HPI.  Assessment & Plan: Visit Diagnoses:    ICD-10-CM   1. Chronic right shoulder pain  M25.511 Large Joint Inj: R glenohumeral   G89.29 XR C-ARM NO REPORT      Plan: No additional findings.   Meds & Orders: No orders of the defined types were placed in this encounter.   Orders Placed This Encounter  Procedures   Large Joint Inj: R glenohumeral   XR C-ARM NO REPORT    Follow-up: Return for  Glee Arvin, MD.   Procedures: Large Joint Inj: R glenohumeral on 07/24/2023 3:04 PM Indications: pain and diagnostic evaluation Details: 22 G 3.5 in needle, fluoroscopy-guided anteromedial approach  Arthrogram: No  Medications: 40 mg triamcinolone acetonide 40 MG/ML; 5 mL bupivacaine 0.25 % Outcome: tolerated well, no immediate complications  There was excellent flow of contrast producing a partial arthrogram of the glenohumeral joint. The patient really did not have much relief at all during the anesthetic phase of the injection and still lacks significant range of motion with abduction and forward flexion of the right  shoulder.. Procedure, treatment alternatives, risks and benefits explained, specific risks discussed. Consent was given by the patient. Immediately prior to procedure a time out was called to verify the correct patient, procedure, equipment, support staff and site/side marked as required. Patient was prepped and draped in the usual sterile fashion.          Clinical History: MRI LUMBAR SPINE WITHOUT CONTRAST   TECHNIQUE: Multiplanar, multisequence MR imaging of the lumbar spine was performed. No intravenous contrast was administered.   COMPARISON:  X-ray lumbar 03/31/2021.   FINDINGS: Segmentation:  Standard.   Alignment:  Physiologic.   Vertebrae: No fracture. No evidence of discitis. No suspicious bone lesion. 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 There are a few additional smaller intraosseous hemangiomas present.   Conus medullaris and cauda equina: Conus extends to the L2 level. Conus and cauda equina appear normal.   Paraspinal and other soft tissues: Left renal cyst. No acute findings.   Disc levels:   T12-L1: Tiny shallow disc protrusion. No foraminal or canal stenosis.   L1-L2: Unremarkable.   L2-L3: Unremarkable.   L3-L4: Unremarkable disc. Mild bilateral facet hypertrophy. No foraminal or canal stenosis.   L4-L5: Unremarkable disc. Mild bilateral facet hypertrophy. No foraminal or canal stenosis.   L5-S1: Shallow central disc protrusion. Mild bilateral facet hypertrophy. No foraminal or canal stenosis.   IMPRESSION: Mild degenerative changes of the lumbar spine without foraminal or canal stenosis at any level.     Electronically Signed   By: Duanne Guess  D.O.   On: 05/02/2021 10:26     Objective:  VS:  HT:    WT:   BMI:     BP:   HR: bpm  TEMP: ( )  RESP:  Physical Exam   Imaging: XR C-ARM NO REPORT Result Date: 07/24/2023 Please see Notes tab for imaging impression.

## 2023-07-24 NOTE — Telephone Encounter (Signed)
Looks like allergic to codeine so cannot do tylenol 3.  I sent in a small rx for norco

## 2023-07-25 ENCOUNTER — Other Ambulatory Visit: Payer: Self-pay | Admitting: Physician Assistant

## 2023-07-25 MED ORDER — HYDROCODONE-ACETAMINOPHEN 5-325 MG PO TABS
1.0000 | ORAL_TABLET | Freq: Every day | ORAL | 0 refills | Status: DC | PRN
Start: 2023-07-25 — End: 2024-03-14

## 2023-07-25 NOTE — Telephone Encounter (Signed)
resent

## 2023-08-05 MED ORDER — BUPIVACAINE HCL 0.25 % IJ SOLN
5.0000 mL | INTRAMUSCULAR | Status: AC | PRN
  Administered 2023-07-24: 5 mL via INTRA_ARTICULAR

## 2023-08-05 MED ORDER — TRIAMCINOLONE ACETONIDE 40 MG/ML IJ SUSP
40.0000 mg | INTRAMUSCULAR | Status: AC | PRN
  Administered 2023-07-24: 40 mg via INTRA_ARTICULAR

## 2023-08-15 ENCOUNTER — Encounter: Payer: Self-pay | Admitting: Orthopaedic Surgery

## 2023-08-16 ENCOUNTER — Other Ambulatory Visit: Payer: Self-pay

## 2023-08-16 DIAGNOSIS — G8929 Other chronic pain: Secondary | ICD-10-CM

## 2023-08-16 NOTE — Telephone Encounter (Signed)
 Please order C spine MRI.  Thanks.

## 2023-08-21 ENCOUNTER — Encounter: Payer: Self-pay | Admitting: Orthopaedic Surgery

## 2023-08-26 ENCOUNTER — Encounter: Payer: Self-pay | Admitting: Orthopaedic Surgery

## 2023-08-27 ENCOUNTER — Encounter: Payer: Self-pay | Admitting: Orthopaedic Surgery

## 2023-08-28 ENCOUNTER — Encounter: Payer: Self-pay | Admitting: Orthopaedic Surgery

## 2023-08-30 ENCOUNTER — Other Ambulatory Visit (INDEPENDENT_AMBULATORY_CARE_PROVIDER_SITE_OTHER): Payer: Self-pay

## 2023-08-30 ENCOUNTER — Ambulatory Visit: Admitting: Orthopaedic Surgery

## 2023-08-30 ENCOUNTER — Encounter: Payer: Self-pay | Admitting: Orthopaedic Surgery

## 2023-08-30 DIAGNOSIS — M25511 Pain in right shoulder: Secondary | ICD-10-CM

## 2023-08-30 DIAGNOSIS — M542 Cervicalgia: Secondary | ICD-10-CM | POA: Diagnosis not present

## 2023-08-30 DIAGNOSIS — G8929 Other chronic pain: Secondary | ICD-10-CM

## 2023-08-30 NOTE — Progress Notes (Signed)
 Office Visit Note   Patient: Angelica Sharp           Date of Birth: December 30, 1965           MRN: [id number] Visit Date: 08/30/2023              Requested by: Sherlie Ban, NP [phone] [address] [place],  [place] [address] PCP: Sherlie Ban, NP   Assessment & Plan: Visit Diagnoses:  1. Neck pain   2. Chronic right shoulder pain     Plan: I am a little perplexed why her shoulder pain is so severe as MRI of the shoulder is not all that impressive.  Will need C-spine MRI to rule out that as source of pain.  Could be manifestation of gout as her uric acid is significantly elevated and has never been on any treatments for this.  Also could be fibromyalgia as she does have multiple arthralgias.  We will have Adah follow-up after the C-spine MRI to discuss further treatment options.  Follow-Up Instructions: No follow-ups on file.   Orders:  Orders Placed This Encounter  Procedures   XR Cervical Spine 2 or 3 views   No orders of the defined types were placed in this encounter.     Procedures: No procedures performed   Clinical Data: No additional findings.   Subjective: Chief Complaint  Patient presents with   Neck - Pain    HPI Angelica Sharp comes in today for follow-up evaluation of right shoulder pain.  She reports pain as 3 out of 10 ache at rest and 8 out of 10 pain with activity.  Recently saw a rheumatologist who ruled out autoimmune disorder.  Most recent uric acid level was abnormally elevated.  Review of Systems  Constitutional: Negative.   HENT: Negative.    Eyes: Negative.   Respiratory: Negative.    Cardiovascular: Negative.   Endocrine: Negative.   Musculoskeletal: Negative.   Neurological: Negative.   Hematological: Negative.   Psychiatric/Behavioral: Negative.    All other systems reviewed and are negative.    Objective: Vital Signs: LMP 08/10/2012 Comment: ovaries remain//a.c.  Physical Exam Vitals and nursing note reviewed.  Constitutional:       Appearance: She is well-developed.  HENT:     Head: Normocephalic and atraumatic.  Pulmonary:     Effort: Pulmonary effort is normal.  Abdominal:     Palpations: Abdomen is soft.  Musculoskeletal:     Cervical back: Neck supple.  Skin:    General: Skin is warm.     Capillary Refill: Capillary refill takes less than 2 seconds.  Neurological:     Mental Status: She is alert and oriented to person, place, and time.  Psychiatric:        Behavior: Behavior normal.        Thought Content: Thought content normal.        Judgment: Judgment normal.     Ortho Exam Exam of the right shoulder is unchanged from prior visit. Exam of the cervical spine shows good range of motion without pain.  Negative Spurling's. Specialty Comments:  MRI LUMBAR SPINE WITHOUT CONTRAST   TECHNIQUE: Multiplanar, multisequence MR imaging of the lumbar spine was performed. No intravenous contrast was administered.   COMPARISON:  X-ray lumbar 03/31/2021.   FINDINGS: Segmentation:  Standard.   Alignment:  Physiologic.   Vertebrae: No fracture. No evidence of discitis. No suspicious bone lesion. 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 There  are a few additional smaller intraosseous hemangiomas present.   Conus medullaris and cauda equina: Conus extends to the L2 level. Conus and cauda equina appear normal.   Paraspinal and other soft tissues: Left renal cyst. No acute findings.   Disc levels:   T12-L1: Tiny shallow disc protrusion. No foraminal or canal stenosis.   L1-L2: Unremarkable.   L2-L3: Unremarkable.   L3-L4: Unremarkable disc. Mild bilateral facet hypertrophy. No foraminal or canal stenosis.   L4-L5: Unremarkable disc. Mild bilateral facet hypertrophy. No foraminal or canal stenosis.   L5-S1: Shallow central disc protrusion. Mild bilateral facet hypertrophy. No foraminal or canal stenosis.   IMPRESSION: Mild degenerative changes of the lumbar  spine without foraminal or canal stenosis at any level.     Electronically Signed   By: Duanne Guess D.O.   On: 05/02/2021 10:26  Imaging: XR Cervical Spine 2 or 3 views Result Date: 08/30/2023 X-rays of the cervical spine show minor degenerative spurring of the vertebral bodies.  Normal cervical lordosis.  No acute or structural abnormalities.  Well-preserved disc spaces.    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   Fatty liver    GERD (gastroesophageal reflux disease)    H/O hiatal hernia    Headache    HLD (hyperlipidemia)    Hypertension    Pneumonia    PONV (postoperative nausea and vomiting)    SOB (shortness of breath)     Family History  Problem Relation Age of Onset   Heart attack Mother    Diabetes Mother    Hyperlipidemia Mother    Hypertension Mother    Stroke Sister    Pulmonary embolism Father    Clotting disorder Father     Diabetes Sister    Kidney disease Sister        kidney failure   Heart disease Sister    Cancer Sister        abdominal   Diabetes Son 27       TYPE 1   Kidney cancer Sister    Crohn's disease Niece    Cancer Nephew        gallbladder   Stomach cancer Neg Hx    Rectal cancer Neg Hx    Esophageal cancer Neg Hx    Colon cancer Neg Hx     Past Surgical History:  Procedure Laterality Date   BREAST LUMPECTOMY     4 lumps -- two from right -- one from left   LAPAROSCOPIC ASSISTED VAGINAL HYSTERECTOMY N/A 09/18/2012   Procedure: LAPAROSCOPIC ASSISTED VAGINAL HYSTERECTOMY;  Surgeon: Mitchel Honour, DO;  Location: [facility];  Service: Gynecology;  Laterality: N/A;  BLADDER  DISTENTION   TOTAL HIP ARTHROPLASTY Left 11/10/2018   Procedure: LEFT TOTAL HIP ARTHROPLASTY ANTERIOR APPROACH;  Surgeon: Tarry Kos, MD;  Location: [facility];  Service: Orthopedics;  Laterality: Left;   TUBAL LIGATION     Social History   Occupational History   Occupation: Designer, industrial/product: Orthoptist    Comment: [employer] - now works from home  Tobacco Use   Smoking status: Never   Smokeless tobacco: Never  Vaping Use   Vaping status: Never Used  Substance and Sexual Activity   Alcohol use: No   Drug use: No   Sexual activity: Not Currently

## 2023-09-01 ENCOUNTER — Other Ambulatory Visit

## 2023-09-04 ENCOUNTER — Other Ambulatory Visit: Payer: Self-pay | Admitting: Urology

## 2023-09-04 DIAGNOSIS — Z8051 Family history of malignant neoplasm of kidney: Secondary | ICD-10-CM

## 2023-09-10 ENCOUNTER — Ambulatory Visit
Admission: RE | Admit: 2023-09-10 | Discharge: 2023-09-10 | Disposition: A | Discharge status: Home / Self Care | Source: Ambulatory Visit | Attending: Urology | Admitting: Urology

## 2023-09-10 DIAGNOSIS — Z8051 Family history of malignant neoplasm of kidney: Secondary | ICD-10-CM

## 2023-09-12 ENCOUNTER — Telehealth: Payer: Self-pay

## 2023-09-12 NOTE — Telephone Encounter (Signed)
 Spoke with patient about MRI C-spine. Insurance is denying without 6 weeks of PT first. Patient wants to put off the MRI and PT. She is trying a medication that her PCP gave her. She will let us know if she wants to pursue PT or  MRI.

## 2023-09-26 ENCOUNTER — Encounter: Payer: Self-pay | Admitting: Orthopaedic Surgery

## 2023-09-27 NOTE — Telephone Encounter (Signed)
 Called patient.  PCP put her on meds for elevated uric acid.  I suggested resuming hip exercises and giving everything more time.  Not bad enough for surgery right now.

## 2023-10-23 ENCOUNTER — Telehealth: Admitting: Family Medicine

## 2023-10-23 DIAGNOSIS — R3989 Other symptoms and signs involving the genitourinary system: Secondary | ICD-10-CM

## 2023-10-23 MED ORDER — CEPHALEXIN 500 MG PO CAPS: 500.0000 mg | ORAL_CAPSULE | Freq: Two times a day (BID) | ORAL | 0 refills | Status: AC

## 2023-10-23 NOTE — Progress Notes (Signed)

## 2023-11-15 ENCOUNTER — Encounter: Payer: Self-pay | Admitting: Orthopaedic Surgery

## 2023-11-17 NOTE — Telephone Encounter (Signed)
 See message.

## 2023-11-18 ENCOUNTER — Other Ambulatory Visit: Payer: Self-pay

## 2023-11-21 ENCOUNTER — Other Ambulatory Visit: Payer: Self-pay

## 2023-11-21 DIAGNOSIS — M542 Cervicalgia: Secondary | ICD-10-CM

## 2023-11-21 NOTE — Telephone Encounter (Signed)
 See message.

## 2023-11-25 ENCOUNTER — Encounter: Payer: Self-pay | Admitting: Orthopaedic Surgery

## 2023-11-27 ENCOUNTER — Other Ambulatory Visit: Payer: Self-pay | Admitting: Orthopaedic Surgery

## 2023-11-27 MED ORDER — KETOROLAC TROMETHAMINE 10 MG PO TABS: 10.0000 mg | ORAL_TABLET | Freq: Two times a day (BID) | ORAL | 0 refills | Status: DC | PRN

## 2023-12-04 ENCOUNTER — Other Ambulatory Visit: Payer: Self-pay

## 2023-12-04 ENCOUNTER — Ambulatory Visit: Admitting: Orthopaedic Surgery

## 2023-12-04 DIAGNOSIS — M25551 Pain in right hip: Secondary | ICD-10-CM | POA: Diagnosis not present

## 2023-12-04 DIAGNOSIS — G8929 Other chronic pain: Secondary | ICD-10-CM | POA: Diagnosis not present

## 2023-12-04 MED ORDER — BUPIVACAINE HCL 0.5 % IJ SOLN
3.0000 mL | INTRAMUSCULAR | Status: AC | PRN
  Administered 2023-12-04: 3 mL via INTRA_ARTICULAR

## 2023-12-04 MED ORDER — LIDOCAINE HCL 1 % IJ SOLN
3.0000 mL | INTRAMUSCULAR | Status: AC | PRN
  Administered 2023-12-04: 3 mL

## 2023-12-04 MED ORDER — METHYLPREDNISOLONE ACETATE 40 MG/ML IJ SUSP
40.0000 mg | INTRAMUSCULAR | Status: AC | PRN
  Administered 2023-12-04: 40 mg via INTRA_ARTICULAR

## 2023-12-04 NOTE — Progress Notes (Signed)
 Office Visit Note   Patient: Angelica Sharp           Date of Birth: August 26, 1965           MRN: [id number] Visit Date: 12/04/2023              Requested by: Duwaine Burnard Amble, NP [phone] [address] [place],  [place] [address] PCP: Duwaine Burnard Amble, NP   Assessment & Plan: Visit Diagnoses:  1. Chronic right hip pain     Plan: History of Present Illness Angelica Sharp is a 58 year old female who presents with worsening right hip pain.  She experiences persistent right hip pain, worsening after yard work, located in the groin and radiating to the gluteal region. The pain impairs her ability to lift her right leg and causes her to drag it when walking. Activity exacerbates the pain, while rest alleviates it.  She received a steroid injection in December without relief and has not undergone physical therapy for her right hip. She performs home exercises from her previous left hip replacement therapy. Current medications include Voltaren  and Flexeril .  Physical examination reveals no pain on leg rolling or bursal tenderness, but increased pain with internal rotation of the hip.  Physical Exam MUSCULOSKELETAL: Increased pain on internal rotation of right hip. No pain on passive leg roll of right hip. No pain on palpation of right hip bursa.  Results RADIOLOGY Hip MRI: No evidence of avascular necrosis; arthritis present (04/2023)  Assessment and Plan Right hip pain Chronic right hip pain localized to groin and gluteal region, worsened by movement. Previous steroid injection ineffective. Imaging shows no rapid progression of arthritis or avascular necrosis. Hip replacement too aggressive at this time in my opinion. Possible heightened pain perception. Opted for another steroid injection and continuation of home physical therapy exercises. - Administer right hip steroid injection with ultrasound guidance with Dr. Burnetta.  Generalized pain Generalized heightened pain perception without clear  etiology. No current pain clinic involvement. Managed with Voltaren  and Flexeril  by PCP. Discussed potential referral to a pain clinic if pain management becomes unmanageable. - Consider referral to a pain clinic if pain management becomes unmanageable.  Follow-Up Instructions: No follow-ups on file.   Orders:  Orders Placed This Encounter  Procedures   Large Joint Inj   No orders of the defined types were placed in this encounter.     Procedures: Large Joint Inj: R hip joint on 12/04/2023 8:34 AM Indications: pain Details: 22 G needle, ultrasound-guided  Arthrogram: No  Medications: 3 mL lidocaine  1 %; 3 mL bupivacaine  0.5 %; 40 mg methylPREDNISolone  acetate 40 MG/ML Patient was prepped and draped in the usual sterile fashion.      Subjective: Chief Complaint  Patient presents with   Right Hip - Pain      Review of Systems  Constitutional: Negative.   HENT: Negative.    Eyes: Negative.   Respiratory: Negative.    Cardiovascular: Negative.   Endocrine: Negative.   Musculoskeletal: Negative.   Neurological: Negative.   Hematological: Negative.   Psychiatric/Behavioral: Negative.    All other systems reviewed and are negative.    Objective: Vital Signs: LMP 08/10/2012 Comment: ovaries remain//a.c.  Physical Exam Vitals and nursing note reviewed.  Constitutional:      Appearance: She is well-developed.  HENT:     Head: Atraumatic.     Nose: Nose normal.   Eyes:     Extraocular Movements: Extraocular movements intact.    Cardiovascular:  Pulses: Normal pulses.  Pulmonary:     Effort: Pulmonary effort is normal.  Abdominal:     Palpations: Abdomen is soft.   Musculoskeletal:     Cervical back: Neck supple.   Skin:    General: Skin is warm.     Capillary Refill: Capillary refill takes less than 2 seconds.   Neurological:     Mental Status: She is alert. Mental status is at baseline.   Psychiatric:        Behavior: Behavior normal.         Thought Content: Thought content normal.        Judgment: Judgment normal.     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   Fatty liver    GERD (gastroesophageal reflux disease)    H/O hiatal hernia    Headache    HLD (hyperlipidemia)    Hypertension    Pneumonia    PONV (postoperative nausea and vomiting)    SOB (shortness of breath)     Family History  Problem Relation Age of Onset   Heart attack Mother    Diabetes Mother    Hyperlipidemia Mother    Hypertension Mother    Stroke Sister    Pulmonary embolism Father    Clotting disorder Father    Diabetes Sister    Kidney disease Sister        kidney failure   Heart disease Sister    Cancer Sister        abdominal   Diabetes Son 27       TYPE 1   Kidney cancer Sister    Crohn's disease Niece    Cancer Nephew        gallbladder   Stomach cancer Neg Hx    Rectal cancer Neg Hx    Esophageal  cancer Neg Hx    Colon cancer Neg Hx     Past Surgical History:  Procedure Laterality Date   BREAST LUMPECTOMY     4 lumps -- two from right -- one from left   LAPAROSCOPIC ASSISTED VAGINAL HYSTERECTOMY N/A 09/18/2012   Procedure: LAPAROSCOPIC ASSISTED VAGINAL HYSTERECTOMY;  Surgeon: Duwaine Blumenthal, DO;  Location: [facility];  Service: Gynecology;  Laterality: N/A;  BLADDER  DISTENTION   TOTAL HIP ARTHROPLASTY Left 11/10/2018   Procedure: LEFT TOTAL HIP ARTHROPLASTY ANTERIOR APPROACH;  Surgeon: Jerri Kay HERO, MD;  Location: [facility] OR;  Service: Orthopedics;  Laterality: Left;   TUBAL LIGATION     Social History   Occupational History   Occupation: Designer, industrial/product: Orthoptist    Comment: [employer] - now works from home  Tobacco Use   Smoking status: Never   Smokeless tobacco: Never  Vaping Use   Vaping status: Never Used  Substance and Sexual Activity   Alcohol use: No   Drug use: No   Sexual activity: Not Currently

## 2023-12-26 ENCOUNTER — Telehealth: Admitting: Physician Assistant

## 2023-12-26 DIAGNOSIS — R3989 Other symptoms and signs involving the genitourinary system: Secondary | ICD-10-CM | POA: Diagnosis not present

## 2023-12-26 MED ORDER — CEPHALEXIN 500 MG PO CAPS: 500.0000 mg | ORAL_CAPSULE | Freq: Two times a day (BID) | ORAL | 0 refills | Status: AC

## 2023-12-26 NOTE — Progress Notes (Signed)

## 2023-12-26 NOTE — Progress Notes (Signed)
 I have spent 5 minutes in review of e-visit questionnaire, review and updating patient chart, medical decision making and response to patient.   Piedad Climes, PA-C

## 2024-01-26 ENCOUNTER — Telehealth: Admitting: Nurse Practitioner

## 2024-01-26 DIAGNOSIS — J019 Acute sinusitis, unspecified: Secondary | ICD-10-CM | POA: Diagnosis not present

## 2024-01-26 DIAGNOSIS — B9689 Other specified bacterial agents as the cause of diseases classified elsewhere: Secondary | ICD-10-CM | POA: Diagnosis not present

## 2024-01-26 MED ORDER — AMOXICILLIN-POT CLAVULANATE 875-125 MG PO TABS: 1.0000 | ORAL_TABLET | Freq: Two times a day (BID) | ORAL | 0 refills | Status: DC

## 2024-01-26 MED ORDER — PROMETHAZINE-DM 6.25-15 MG/5ML PO SYRP: 5.0000 mL | ORAL_SOLUTION | Freq: Four times a day (QID) | ORAL | 0 refills | Status: AC | PRN

## 2024-01-26 NOTE — Progress Notes (Signed)
 E-Visit for Sinus Problems  We are sorry that you are not feeling well.  Here is how we plan to help!  Based on what you have shared with me it looks like you have sinusitis.  Sinusitis is inflammation and infection in the sinus cavities of the head.  Based on your presentation I believe you most likely have Acute Bacterial Sinusitis.  This is an infection caused by bacteria and is treated with antibiotics. I have prescribed Augmentin  875mg /125mg  one tablet twice daily with food, for 7 days. I have also sent promethazine  dm for the cough.  You may use an oral decongestant such as Mucinex  D or if you have glaucoma or high blood pressure use plain Mucinex . Saline nasal spray help and can safely be used as often as needed for congestion.  If you develop worsening sinus pain, fever or notice severe headache and vision changes, or if symptoms are not better after completion of antibiotic, please schedule an appointment with a health care provider.    Sinus infections are not as easily transmitted as other respiratory infection, however we still recommend that you avoid close contact with loved ones, especially the very young and elderly.  Remember to wash your hands thoroughly throughout the day as this is the number one way to prevent the spread of infection!  Home Care: Only take medications as instructed by your medical team. Complete the entire course of an antibiotic. Do not take these medications with alcohol. A steam or ultrasonic humidifier can help congestion.  You can place a towel over your head and breathe in the steam from hot water coming from a faucet. Avoid close contacts especially the very young and the elderly. Cover your mouth when you cough or sneeze. Always remember to wash your hands.  Get Help Right Away If: You develop worsening fever or sinus pain. You develop a severe head ache or visual changes. Your symptoms persist after you have completed your treatment plan.  Make  sure you Understand these instructions. Will watch your condition. Will get help right away if you are not doing well or get worse.  Thank you for choosing an e-visit.  Your e-visit answers were reviewed by a board certified advanced clinical practitioner to complete your personal care plan. Depending upon the condition, your plan could have included both over the counter or prescription medications.  Please review your pharmacy choice. Make sure the pharmacy is open so you can pick up prescription now. If there is a problem, you may contact your provider through [employer] and have the prescription routed to another pharmacy.  Your safety is important to us . If you have drug allergies check your prescription carefully.   For the next 24 hours you can use MyChart to ask questions about today's visit, request a non-urgent call back, or ask for a work or school excuse. You will get an email in the next two days asking about your experience. I hope that your e-visit has been valuable and will speed your recovery.    have provided 5 minutes of non face to face time during this encounter for chart review and documentation.

## 2024-01-26 NOTE — Addendum Note (Signed)
 Addended by: ALMEDA DEGREE on: 01/26/2024 03:39 PM   Modules accepted: Orders

## 2024-03-12 ENCOUNTER — Telehealth: Admitting: Physician Assistant

## 2024-03-12 DIAGNOSIS — R3989 Other symptoms and signs involving the genitourinary system: Secondary | ICD-10-CM | POA: Diagnosis not present

## 2024-03-12 MED ORDER — AMOXICILLIN-POT CLAVULANATE 500-125 MG PO TABS: 1.0000 | ORAL_TABLET | Freq: Three times a day (TID) | ORAL | 0 refills | Status: DC

## 2024-03-12 NOTE — Progress Notes (Signed)
 We are sorry that you are not feeling well.  Here is how we plan to help!  Based on what you shared with me it looks like you most likely have a simple urinary tract infection.  A UTI (Urinary Tract Infection) is a bacterial infection of the bladder.  Most cases of urinary tract infections are simple to treat but a key part of your care is to encourage you to drink plenty of fluids and watch your symptoms carefully.  I have prescribed Augmentin  500-125 twice daily for 7 days giving your other medication allergies/intolerances.  Your symptoms should gradually improve. Call us  if the burning in your urine worsens, you develop worsening fever, back pain or pelvic pain or if your symptoms do not resolve after completing the antibiotic.  Urinary tract infections can be prevented by drinking plenty of water to keep your body hydrated.  Also be sure when you wipe, wipe from front to back and don't hold it in!  If possible, empty your bladder every 4 hours.  Your e-visit answers were reviewed by a board certified advanced clinical practitioner to complete your personal care plan.  Depending on the condition, your plan could have included both over the counter or prescription medications.  If there is a problem please reply  once you have received a response from your provider.  Your safety is important to us .  If you have drug allergies check your prescription carefully.    You can use MyChart to ask questions about today's visit, request a non-urgent call back, or ask for a work or school excuse for 24 hours related to this e-Visit. If it has been greater than 24 hours you will need to follow up with your provider, or enter a new e-Visit to address those concerns.   You will get an e-mail in the next two days asking about your experience.  I hope that your e-visit has been valuable and will speed your recovery. Thank you for using e-visits.  I have spent 5 minutes in review of e-visit questionnaire,  review and updating patient chart, medical decision making and response to patient.   Elsie Velma Lunger, PA-C

## 2024-03-13 ENCOUNTER — Inpatient Hospital Stay (HOSPITAL_BASED_OUTPATIENT_CLINIC_OR_DEPARTMENT_OTHER)
Admission: EM | Admit: 2024-03-13 | Discharge: 2024-03-15 | DRG: 872 | Disposition: A | Discharge status: Home / Self Care | Attending: Internal Medicine | Admitting: Internal Medicine

## 2024-03-13 ENCOUNTER — Other Ambulatory Visit: Payer: Self-pay

## 2024-03-13 ENCOUNTER — Encounter (HOSPITAL_BASED_OUTPATIENT_CLINIC_OR_DEPARTMENT_OTHER): Payer: Self-pay | Admitting: Emergency Medicine

## 2024-03-13 ENCOUNTER — Emergency Department (HOSPITAL_BASED_OUTPATIENT_CLINIC_OR_DEPARTMENT_OTHER)

## 2024-03-13 DIAGNOSIS — Z885 Allergy status to narcotic agent status: Secondary | ICD-10-CM

## 2024-03-13 DIAGNOSIS — Z96642 Presence of left artificial hip joint: Secondary | ICD-10-CM | POA: Diagnosis present

## 2024-03-13 DIAGNOSIS — Z832 Family history of diseases of the blood and blood-forming organs and certain disorders involving the immune mechanism: Secondary | ICD-10-CM

## 2024-03-13 DIAGNOSIS — Z823 Family history of stroke: Secondary | ICD-10-CM

## 2024-03-13 DIAGNOSIS — Z833 Family history of diabetes mellitus: Secondary | ICD-10-CM

## 2024-03-13 DIAGNOSIS — Z7984 Long term (current) use of oral hypoglycemic drugs: Secondary | ICD-10-CM

## 2024-03-13 DIAGNOSIS — A419 Sepsis, unspecified organism: Principal | ICD-10-CM | POA: Diagnosis present

## 2024-03-13 DIAGNOSIS — Z83438 Family history of other disorder of lipoprotein metabolism and other lipidemia: Secondary | ICD-10-CM

## 2024-03-13 DIAGNOSIS — E119 Type 2 diabetes mellitus without complications: Secondary | ICD-10-CM | POA: Diagnosis present

## 2024-03-13 DIAGNOSIS — K5732 Diverticulitis of large intestine without perforation or abscess without bleeding: Secondary | ICD-10-CM | POA: Diagnosis present

## 2024-03-13 DIAGNOSIS — E785 Hyperlipidemia, unspecified: Secondary | ICD-10-CM | POA: Diagnosis present

## 2024-03-13 DIAGNOSIS — R6510 Systemic inflammatory response syndrome (SIRS) of non-infectious origin without acute organ dysfunction: Secondary | ICD-10-CM

## 2024-03-13 DIAGNOSIS — Z8419 Family history of other disorders of kidney and ureter: Secondary | ICD-10-CM

## 2024-03-13 DIAGNOSIS — Z8249 Family history of ischemic heart disease and other diseases of the circulatory system: Secondary | ICD-10-CM

## 2024-03-13 DIAGNOSIS — K5792 Diverticulitis of intestine, part unspecified, without perforation or abscess without bleeding: Secondary | ICD-10-CM | POA: Diagnosis present

## 2024-03-13 DIAGNOSIS — Z7985 Long-term (current) use of injectable non-insulin antidiabetic drugs: Secondary | ICD-10-CM

## 2024-03-13 DIAGNOSIS — Z882 Allergy status to sulfonamides status: Secondary | ICD-10-CM

## 2024-03-13 DIAGNOSIS — Z8051 Family history of malignant neoplasm of kidney: Secondary | ICD-10-CM

## 2024-03-13 DIAGNOSIS — Z888 Allergy status to other drugs, medicaments and biological substances status: Secondary | ICD-10-CM

## 2024-03-13 DIAGNOSIS — K219 Gastro-esophageal reflux disease without esophagitis: Secondary | ICD-10-CM | POA: Diagnosis present

## 2024-03-13 DIAGNOSIS — N390 Urinary tract infection, site not specified: Secondary | ICD-10-CM | POA: Diagnosis present

## 2024-03-13 DIAGNOSIS — I10 Essential (primary) hypertension: Secondary | ICD-10-CM | POA: Diagnosis present

## 2024-03-13 DIAGNOSIS — Z90710 Acquired absence of both cervix and uterus: Secondary | ICD-10-CM

## 2024-03-13 DIAGNOSIS — N3000 Acute cystitis without hematuria: Principal | ICD-10-CM | POA: Diagnosis present

## 2024-03-13 LAB — CBC
HCT: 43.6 % (ref 36.0–46.0)
Hemoglobin: 13.9 g/dL (ref 12.0–15.0)
MCH: 26.9 pg (ref 26.0–34.0)
MCHC: 31.9 g/dL (ref 30.0–36.0)
MCV: 84.5 fL (ref 80.0–100.0)
Platelets: 296 K/uL (ref 150–400)
RBC: 5.16 MIL/uL — ABNORMAL HIGH (ref 3.87–5.11)
RDW: 13.8 % (ref 11.5–15.5)
WBC: 11 K/uL — ABNORMAL HIGH (ref 4.0–10.5)
nRBC: 0 % (ref 0.0–0.2)

## 2024-03-13 LAB — LACTIC ACID, PLASMA
Lactic Acid, Venous: 2.7 mmol/L (ref 0.5–1.9)
Lactic Acid, Venous: 3.7 mmol/L (ref 0.5–1.9)

## 2024-03-13 LAB — URINALYSIS, ROUTINE W REFLEX MICROSCOPIC
Bilirubin Urine: NEGATIVE
Glucose, UA: NEGATIVE mg/dL
Hgb urine dipstick: NEGATIVE
Ketones, ur: NEGATIVE mg/dL
Nitrite: POSITIVE — AB
Protein, ur: 30 mg/dL — AB
Specific Gravity, Urine: 1.025 (ref 1.005–1.030)
pH: 7 (ref 5.0–8.0)

## 2024-03-13 LAB — GLUCOSE, CAPILLARY: Glucose-Capillary: 177 mg/dL — ABNORMAL HIGH (ref 70–99)

## 2024-03-13 LAB — HEPATIC FUNCTION PANEL
ALT: 26 U/L (ref 0–44)
AST: 23 U/L (ref 15–41)
Albumin: 4.5 g/dL (ref 3.5–5.0)
Alkaline Phosphatase: 87 U/L (ref 38–126)
Bilirubin, Direct: 0.2 mg/dL (ref 0.0–0.2)
Indirect Bilirubin: 0.3 mg/dL (ref 0.3–0.9)
Total Bilirubin: 0.5 mg/dL (ref 0.0–1.2)
Total Protein: 7.5 g/dL (ref 6.5–8.1)

## 2024-03-13 LAB — BASIC METABOLIC PANEL WITH GFR
Anion gap: 15 (ref 5–15)
BUN: 10 mg/dL (ref 6–20)
CO2: 25 mmol/L (ref 22–32)
Calcium: 9.7 mg/dL (ref 8.9–10.3)
Chloride: 98 mmol/L (ref 98–111)
Creatinine, Ser: 1 mg/dL (ref 0.44–1.00)
GFR, Estimated: >60 mL/min (ref >60)
Glucose, Bld: 133 mg/dL — ABNORMAL HIGH (ref 70–99)
Potassium: 4.2 mmol/L (ref 3.5–5.1)
Sodium: 138 mmol/L (ref 135–145)

## 2024-03-13 LAB — HEMOGLOBIN A1C
Hgb A1c MFr Bld: 6.4 % — ABNORMAL HIGH (ref 4.8–5.6)
Mean Plasma Glucose: 136.98 mg/dL

## 2024-03-13 LAB — URINALYSIS, MICROSCOPIC (REFLEX): RBC / HPF: NONE SEEN RBC/hpf (ref 0–5)

## 2024-03-13 LAB — HIV ANTIBODY (ROUTINE TESTING W REFLEX): HIV Screen 4th Generation wRfx: NONREACTIVE

## 2024-03-13 LAB — LIPASE, BLOOD: Lipase: 13 U/L (ref 11–51)

## 2024-03-13 MED ORDER — IOHEXOL 300 MG/ML  SOLN
100.0000 mL | Freq: Once | INTRAMUSCULAR | Status: AC | PRN
  Administered 2024-03-13: 100 mL via INTRAVENOUS

## 2024-03-13 MED ORDER — INSULIN ASPART 100 UNIT/ML IJ SOLN: 0.0000 [IU] | Freq: Every day | INTRAMUSCULAR | Status: DC

## 2024-03-13 MED ORDER — CEFTRIAXONE SODIUM 1 G IJ SOLR
1.0000 g | Freq: Once | INTRAMUSCULAR | Status: AC
  Administered 2024-03-13: 1 g via INTRAVENOUS
  Filled 2024-03-13: qty 10

## 2024-03-13 MED ORDER — LACTATED RINGERS IV SOLN: INTRAVENOUS | Status: DC

## 2024-03-13 MED ORDER — ONDANSETRON HCL 4 MG PO TABS: 4.0000 mg | ORAL_TABLET | Freq: Four times a day (QID) | ORAL | Status: DC | PRN

## 2024-03-13 MED ORDER — ONDANSETRON HCL 4 MG/2ML IJ SOLN
4.0000 mg | Freq: Four times a day (QID) | INTRAMUSCULAR | Status: DC | PRN
  Administered 2024-03-14: 4 mg via INTRAVENOUS
  Filled 2024-03-13: qty 2

## 2024-03-13 MED ORDER — LACTATED RINGERS IV SOLN: INTRAVENOUS | Status: AC

## 2024-03-13 MED ORDER — KETOROLAC TROMETHAMINE 15 MG/ML IJ SOLN
15.0000 mg | Freq: Once | INTRAMUSCULAR | Status: AC
  Administered 2024-03-13: 15 mg via INTRAVENOUS
  Filled 2024-03-13: qty 1

## 2024-03-13 MED ORDER — METRONIDAZOLE 500 MG/100ML IV SOLN
500.0000 mg | Freq: Two times a day (BID) | INTRAVENOUS | Status: DC
  Administered 2024-03-14 – 2024-03-15 (×3): 500 mg via INTRAVENOUS
  Filled 2024-03-13 (×3): qty 100

## 2024-03-13 MED ORDER — SODIUM CHLORIDE 0.9 % IV SOLN
2.0000 g | INTRAVENOUS | Status: DC
  Administered 2024-03-14: 2 g via INTRAVENOUS
  Filled 2024-03-13: qty 20

## 2024-03-13 MED ORDER — SODIUM CHLORIDE 0.9 % IV BOLUS
1000.0000 mL | Freq: Once | INTRAVENOUS | Status: AC
  Administered 2024-03-13: 1000 mL via INTRAVENOUS

## 2024-03-13 MED ORDER — INSULIN ASPART 100 UNIT/ML IJ SOLN
0.0000 [IU] | Freq: Three times a day (TID) | INTRAMUSCULAR | Status: DC
  Administered 2024-03-14: 3 [IU] via SUBCUTANEOUS
  Administered 2024-03-15: 2 [IU] via SUBCUTANEOUS

## 2024-03-13 MED ORDER — KETOROLAC TROMETHAMINE 15 MG/ML IJ SOLN
15.0000 mg | Freq: Four times a day (QID) | INTRAMUSCULAR | Status: DC | PRN
  Administered 2024-03-13 – 2024-03-15 (×2): 15 mg via INTRAVENOUS
  Filled 2024-03-13 (×3): qty 1

## 2024-03-13 MED ORDER — METRONIDAZOLE 500 MG/100ML IV SOLN
500.0000 mg | Freq: Once | INTRAVENOUS | Status: AC
  Administered 2024-03-13: 500 mg via INTRAVENOUS
  Filled 2024-03-13: qty 100

## 2024-03-13 MED ORDER — FENTANYL CITRATE PF 50 MCG/ML IJ SOSY
50.0000 ug | PREFILLED_SYRINGE | INTRAMUSCULAR | Status: DC | PRN
  Administered 2024-03-14: 50 ug via INTRAVENOUS
  Filled 2024-03-13: qty 1

## 2024-03-13 NOTE — ED Provider Notes (Signed)
 Emergency Department Provider Note   I have reviewed the triage vital signs and the nursing notes.   HISTORY  Chief Complaint Abdominal Pain   HPI Angelica Sharp is a 58 y.o. female with past history reviewed below including prior diverticulitis and diabetes presents the emergency department for evaluation of lower abdominal pain for the past 3 days.  She initially had some dysuria along with urine frequency.  She had a virtual visit with provider and was prescribed Augmentin .  She is taken 4 doses of that medication and her urine symptoms have improved although has had more persistent and worsening lower abdominal discomfort.  She states it does remind her somewhat of diverticulitis.  Some diarrhea last week but none since starting the antibiotic.  No vomiting.  No chest pain.   Past Medical History:  Diagnosis Date   Allergy    Anemia    Anxiety    Arthritis    Borderline hypertension    Chest pain    Complication of anesthesia    Degenerative lumbar disc    L5S1   Diabetes mellitus without complication (HCC)    Diverticulitis    Dyspnea    Edema    lower extremity   Family history of anesthesia complication    sister had BP complications during anes.   Fatty liver    GERD (gastroesophageal reflux disease)    H/O hiatal hernia    Headache    HLD (hyperlipidemia)    Hypertension    Pneumonia    PONV (postoperative nausea and vomiting)    SOB (shortness of breath)     Review of Systems  Constitutional: No fever/chills Cardiovascular: Denies chest pain. Respiratory: Denies shortness of breath. Gastrointestinal: Positive lower abdominal pain.  No nausea, no vomiting.  No diarrhea.  No constipation. Genitourinary: Positive dysuria, hesitancy, urgency (improving).  Musculoskeletal: Negative for back pain. Skin: Negative for rash. Neurological: Negative for headaches.  ____________________________________________   PHYSICAL EXAM:  VITAL SIGNS: ED Triage  Vitals  Encounter Vitals Group     BP 03/13/24 1218 (!) 147/84     Pulse Rate 03/13/24 1218 (!) 119     Resp 03/13/24 1218 18     Temp 03/13/24 1218 98.6 F (37 C)     Temp Source 03/13/24 1218 Oral     SpO2 03/13/24 1218 96 %     Weight 03/13/24 1218 198 lb (89.8 kg)     Height 03/13/24 1218 5' 7 (1.702 m)   Constitutional: Alert and oriented. Well appearing and in no acute distress. Eyes: Conjunctivae are normal.  Head: Atraumatic. Nose: No congestion/rhinnorhea. Mouth/Throat: Mucous membranes are moist.   Neck: No stridor.  Cardiovascular: Tachycardia. Good peripheral circulation. Grossly normal heart sounds.   Respiratory: Normal respiratory effort.  No retractions. Lungs CTAB. Gastrointestinal: Soft and nontender. No distention.  Musculoskeletal: No gross deformities of extremities. Neurologic:  Normal speech and language.  Skin:  Skin is warm, dry and intact. No rash noted.   ____________________________________________   LABS (all labs ordered are listed, but only abnormal results are displayed)  Labs Reviewed  URINALYSIS, ROUTINE W REFLEX MICROSCOPIC - Abnormal; Notable for the following components:      Result Value   Protein, ur 30 (*)    Nitrite POSITIVE (*)    Leukocytes,Ua TRACE (*)    All other components within normal limits  BASIC METABOLIC PANEL WITH GFR - Abnormal; Notable for the following components:   Glucose, Bld 133 (*)    All  other components within normal limits  CBC - Abnormal; Notable for the following components:   WBC 11.0 (*)    RBC 5.16 (*)    All other components within normal limits  LACTIC ACID, PLASMA - Abnormal; Notable for the following components:   Lactic Acid, Venous 2.7 (*)    All other components within normal limits  LACTIC ACID, PLASMA - Abnormal; Notable for the following components:   Lactic Acid, Venous 3.7 (*)    All other components within normal limits  URINALYSIS, MICROSCOPIC (REFLEX) - Abnormal; Notable for the  following components:   Bacteria, UA FEW (*)    Non Squamous Epithelial PRESENT (*)    All other components within normal limits  HEMOGLOBIN A1C - Abnormal; Notable for the following components:   Hgb A1c MFr Bld 6.4 (*)    All other components within normal limits  COMPREHENSIVE METABOLIC PANEL WITH GFR - Abnormal; Notable for the following components:   Glucose, Bld 110 (*)    Calcium  8.8 (*)    Total Protein 6.0 (*)    Albumin 3.3 (*)    All other components within normal limits  CBC - Abnormal; Notable for the following components:   Hemoglobin 10.9 (*)    HCT 33.5 (*)    All other components within normal limits  GLUCOSE, CAPILLARY - Abnormal; Notable for the following components:   Glucose-Capillary 177 (*)    All other components within normal limits  GLUCOSE, CAPILLARY - Abnormal; Notable for the following components:   Glucose-Capillary 114 (*)    All other components within normal limits  GLUCOSE, CAPILLARY - Abnormal; Notable for the following components:   Glucose-Capillary 113 (*)    All other components within normal limits  GLUCOSE, CAPILLARY - Abnormal; Notable for the following components:   Glucose-Capillary 183 (*)    All other components within normal limits  CBC - Abnormal; Notable for the following components:   Hemoglobin 10.7 (*)    HCT 33.4 (*)    All other components within normal limits  BASIC METABOLIC PANEL WITH GFR - Abnormal; Notable for the following components:   Glucose, Bld 132 (*)    All other components within normal limits  GLUCOSE, CAPILLARY - Abnormal; Notable for the following components:   Glucose-Capillary 139 (*)    All other components within normal limits  GLUCOSE, CAPILLARY - Abnormal; Notable for the following components:   Glucose-Capillary 124 (*)    All other components within normal limits  URINE CULTURE  HEPATIC FUNCTION PANEL  LIPASE, BLOOD  HIV ANTIBODY (ROUTINE TESTING W REFLEX)    ____________________________________________   PROCEDURES  Procedure(s) performed:   Procedures  CRITICAL CARE Performed by: Fonda KANDICE Law Total critical care time: 35 minutes Critical care time was exclusive of separately billable procedures and treating other patients. Critical care was necessary to treat or prevent imminent or life-threatening deterioration. Critical care was time spent personally by me on the following activities: development of treatment plan with patient and/or surrogate as well as nursing, discussions with consultants, evaluation of patient's response to treatment, examination of patient, obtaining history from patient or surrogate, ordering and performing treatments and interventions, ordering and review of laboratory studies, ordering and review of radiographic studies, pulse oximetry and re-evaluation of patient's condition.  Fonda Law, MD Emergency Medicine  ____________________________________________   INITIAL IMPRESSION / ASSESSMENT AND PLAN / ED COURSE  Pertinent labs & imaging results that were available during my care of the patient were reviewed by  me and considered in my medical decision making (see chart for details).   This patient is Presenting for Evaluation of abdominal pain, which does require a range of treatment options, and is a complaint that involves a high risk of morbidity and mortality.  The Differential Diagnoses includes but is not exclusive to acute cholecystitis, intrathoracic causes for epigastric abdominal pain, gastritis, duodenitis, pancreatitis, small bowel or large bowel obstruction, abdominal aortic aneurysm, hernia, gastritis, etc.   Critical Interventions-    Medications  lactated ringers  infusion (0 mLs Intravenous Stopped 03/14/24 2037)  sodium chloride  0.9 % bolus 1,000 mL (0 mLs Intravenous Stopped 03/13/24 1421)  ketorolac  (TORADOL ) 15 MG/ML injection 15 mg (15 mg Intravenous Given 03/13/24 1254)  cefTRIAXone  (ROCEPHIN) 1 g in sodium chloride  0.9 % 100 mL IVPB (0 g Intravenous Stopped 03/13/24 1421)  iohexol  (OMNIPAQUE ) 300 MG/ML solution 100 mL (100 mLs Intravenous Contrast Given 03/13/24 1420)  ketorolac  (TORADOL ) 15 MG/ML injection 15 mg (15 mg Intravenous Given 03/13/24 1518)  metroNIDAZOLE  (FLAGYL ) IVPB 500 mg (0 mg Intravenous Stopped 03/13/24 1637)  HYDROmorphone  (DILAUDID ) injection 1 mg (1 mg Intravenous Given 03/14/24 0915)    Reassessment after intervention: pain improved.   I decided to review pertinent External Data, and in summary patient with virtual visit yesterday prescribed Augmentin  for UTI.   Clinical Laboratory Tests Ordered, included elevated lactic acid. CBC with leukocytosis. No AKI.   Radiologic Tests Ordered, included CT abdomen/pelvis. I independently interpreted the images and agree with radiology interpretation.   Cardiac Monitor Tracing which shows Tachycardia.    Social Determinants of Health Risk patient is a non-smoker.   Consult complete with [facility]. Plan for admit.   Medical Decision Making: Summary:  Patient presents emergency department for evaluation of UTI symptoms which have been slightly improving but now with lower abdominal pain.  Differential includes cystitis versus developing pyelonephritis versus diverticulitis.  Plan for CT abdomen pelvis along with screening blood work including a lactic acid given tachycardia although patient does appear moderately uncomfortable which may be contributing to this.  Afebrile  Reevaluation with update and discussion with patient. SIRS vitals with concern for developing sepsis. Evidence of UTI along with diverticulitis. Plan for abx and admit. She is in agreement.   Patient's presentation is most consistent with acute presentation with potential threat to life or bodily function.   Disposition: admit  ____________________________________________  FINAL CLINICAL IMPRESSION(S) / ED DIAGNOSES  Final diagnoses:  Acute  cystitis without hematuria  SIRS (systemic inflammatory response syndrome) (HCC)     NEW OUTPATIENT MEDICATIONS STARTED DURING THIS VISIT:  Discharge Medication List as of 03/15/2024 11:54 AM     START taking these medications   Details  amoxicillin -clavulanate (AUGMENTIN ) 875-125 MG tablet Take 1 tablet by mouth 2 (two) times daily for 7 days., Starting Sun 03/15/2024, Until Sun 03/22/2024, Normal    ondansetron  (ZOFRAN -ODT) 4 MG disintegrating tablet Take 1 tablet (4 mg total) by mouth every 8 (eight) hours as needed for nausea or vomiting., Starting Sun 03/15/2024, Normal    acetaminophen  (TYLENOL ) 325 MG tablet Take 2 tablets (650 mg total) by mouth every 6 (six) hours as needed for mild pain (pain score 1-3), headache or fever., Starting Sun 03/15/2024, OTC    HYDROcodone -acetaminophen  (NORCO/VICODIN) 5-325 MG tablet Take 1-2 tablets by mouth every 6 (six) hours as needed for severe pain (pain score 7-10) or moderate pain (pain score 4-6)., Starting Sun 03/15/2024, Normal        Note:  This document was  prepared using Conservation officer, historic buildings and may include unintentional dictation errors.  Fonda Law, MD, [facility] Emergency Medicine    Alinda Egolf, Fonda MATSU, MD 03/16/24 [phone]

## 2024-03-13 NOTE — Plan of Care (Signed)

## 2024-03-13 NOTE — ED Triage Notes (Signed)
 Pt c/o dysuria, urinary frquency, lower abd pain x 3 days. Prescribed augmentin  yesterday AM, sx have worsened.   Reports diarrhea earlier this week, denies today. Denies known fever.

## 2024-03-13 NOTE — H&P (Signed)
 History and Physical    Patient: Angelica Sharp FMW:[id number] DOB: 1965-08-16 DOA: 03/13/2024 DOS: the patient was seen and examined on 03/13/2024 PCP: Duwaine Burnard Amble, NP  Patient coming from: Home  Chief Complaint:  Chief Complaint  Patient presents with   Abdominal Pain   HPI: Angelica Sharp is a 58 y.o. female with medical history significant of diverticular disease, GERD, hyperlipidemia, essential hypertension, osteoarthritis, degenerative disc disease, non-insulin -dependent diabetes, anxiety disorder, history of fatty liver who was transferred from [facility] where she went with abdominal pain.  Patient has been having lower abdominal pain for the last 3 days.  Associated with some nausea but no vomiting.  She also has some dysuria and urine frequency.  Patient had a virtual visit with her provider they prescribed Augmentin  yesterday.  So far she has taken 4 doses.  The urinary symptoms seems to have improved but then continuously had more pain in the lower abdomen.  She has had previous diverticulitis and was worried that she may have another 1.  She came to the ER for evaluation.  In the ER workup showed evidence of UTI but CT abdomen pelvis also showed distal sigmoid diverticulitis.  Patient is therefore admitted to the hospital for evaluation and treatment.  She denied any bright red blood per rectum.  Denied any melena.  Pain is subsiding.  More discomfort.  Review of Systems: As mentioned in the history of present illness. All other systems reviewed and are negative. Past Medical History:  Diagnosis Date   Allergy    Anemia    Anxiety    Arthritis    Borderline hypertension    Chest pain    Complication of anesthesia    Degenerative lumbar disc    L5S1   Diabetes mellitus without complication (HCC)    Diverticulitis    Dyspnea    Edema    lower extremity   Family history of anesthesia complication    sister had BP complications during anes.   Fatty liver     GERD (gastroesophageal reflux disease)    H/O hiatal hernia    Headache    HLD (hyperlipidemia)    Hypertension    Pneumonia    PONV (postoperative nausea and vomiting)    SOB (shortness of breath)    Past Surgical History:  Procedure Laterality Date   BREAST LUMPECTOMY     4 lumps -- two from right -- one from left   LAPAROSCOPIC ASSISTED VAGINAL HYSTERECTOMY N/A 09/18/2012   Procedure: LAPAROSCOPIC ASSISTED VAGINAL HYSTERECTOMY;  Surgeon: Duwaine Blumenthal, DO;  Location: [facility];  Service: Gynecology;  Laterality: N/A;  BLADDER  DISTENTION   TOTAL HIP ARTHROPLASTY Left 11/10/2018   Procedure: LEFT TOTAL HIP ARTHROPLASTY ANTERIOR APPROACH;  Surgeon: Jerri Kay HERO, MD;  Location: [facility] OR;  Service: Orthopedics;  Laterality: Left;   TUBAL LIGATION     Social History:  reports that she has never smoked. She has never used smokeless tobacco. She reports that she does not drink alcohol and does not use drugs.  Allergies  Allergen Reactions   Sulfa Antibiotics Hives and Other (See Comments)    Heart Rate goes up   Morphine And Codeine Other (See Comments)    Headache and hallucinations   Septra [Sulfamethoxazole-Trimethoprim] Other (See Comments)    TACHYCARDIA    Family History  Problem Relation Age of Onset   Heart attack Mother    Diabetes Mother    Hyperlipidemia Mother    Hypertension Mother  Stroke Sister    Pulmonary embolism Father    Clotting disorder Father    Diabetes Sister    Kidney disease Sister        kidney failure   Heart disease Sister    Cancer Sister        abdominal   Diabetes Son 61       TYPE 1   Kidney cancer Sister    Crohn's disease Niece    Cancer Nephew        gallbladder   Stomach cancer Neg Hx    Rectal cancer Neg Hx    Esophageal cancer Neg Hx    Colon cancer Neg Hx     Prior to Admission medications   Medication Sig Start Date End Date Taking? Authorizing Provider  ketorolac  (TORADOL ) 10 MG tablet Take 1 tablet (10 mg total) by mouth 2  (two) times daily as needed. 11/27/23   Jerri Kay HERO, MD  amoxicillin -clavulanate (AUGMENTIN ) 500-125 MG tablet Take 1 tablet by mouth 3 (three) times daily. 03/12/24   Gladis Elsie BROCKS, PA-C  benzonatate  (TESSALON ) 200 MG capsule Take 1 capsule (200 mg total) by mouth 2 (two) times daily as needed for cough. 05/03/23   Blair, Diane W, FNP  Calcium  Carb-Cholecalciferol (CALCIUM +D3 PO) Take 1 tablet by mouth daily.    [provider]  famotidine  (PEPCID ) 40 MG tablet Take 40 mg by mouth daily. 10/16/22   [provider]  Ferrous Sulfate (IRON PO) Take by mouth.    [provider]  fluconazole  (DIFLUCAN ) 200 MG tablet Take 1 tablet (200 mg total) by mouth daily as needed for up to 1 dose. Take if you develop symptoms of vaginal yeast infection 09/28/22   Cottie Donnice PARAS, MD  glucose blood test strip Use as instructed 05/10/15   Juliane Che, PA  hydrochlorothiazide  (HYDRODIURIL ) 25 MG tablet Take 1 tablet by mouth once daily 10/26/20   Levora Reyes SAUNDERS, MD  HYDROcodone -acetaminophen  (NORCO/VICODIN) 5-325 MG tablet Take 1-2 tablets by mouth daily as needed for moderate pain (pain score 4-6). 07/25/23   Jule Ronal CROME, PA-C  hydrOXYzine (ATARAX) 25 MG tablet Take 25 mg by mouth 2 (two) times daily. 07/10/23   [provider]  ibuprofen  (ADVIL ) 800 MG tablet Take 1 tablet (800 mg total) by mouth every 8 (eight) hours as needed. Patient not taking: Reported on 07/11/2023 12/08/21   Jerri Kay HERO, MD  loperamide (IMODIUM A-D) 2 MG tablet Take 4 mg by mouth 4 (four) times daily as needed for diarrhea or loose stools.  Patient not taking: Reported on 07/11/2023    [provider]  magnesium  oxide (MAG-OX) 400 (240 Mg) MG tablet Take 400 mg by mouth daily. Patient not taking: Reported on 07/11/2023    [provider]  Melatonin 5 MG CAPS Take 1 capsule by mouth at bedtime.    [provider]  metFORMIN  (GLUCOPHAGE ) 500 MG tablet Take 1 tablet by  mouth in the morning, at noon, and at bedtime. 12/24/18   [provider]  methocarbamol  (ROBAXIN -750) 750 MG tablet Take 1 tablet (750 mg total) by mouth 2 (two) times daily as needed for muscle spasms. Patient not taking: Reported on 07/11/2023 01/18/22   Jule Ronal CROME, PA-C  omeprazole  (PRILOSEC) 40 MG capsule Take 40 mg by mouth daily. 06/04/23   [provider]  oxybutynin (DITROPAN) 5 MG tablet Take 5 mg by mouth daily. 05/06/23   [provider]  polyethylene glycol (MIRALAX  /  GLYCOLAX ) packet Take 17 g by mouth daily as needed for mild constipation.  Patient not taking: Reported on 07/11/2023    [provider]  Potassium 99 MG TABS Take 99-198 mg by mouth See admin instructions. Take 198 mg in the morning and 99 mg at night    [provider]  pravastatin  (PRAVACHOL ) 40 MG tablet TAKE 1 TABLET BY MOUTH AT BEDTIME 09/01/20   Kip Ade, NP  Semaglutide (OZEMPIC, 0.25 OR 0.5 MG/DOSE, [place])  02/08/23   [provider]  traMADol  (ULTRAM ) 50 MG tablet Take 1-2 tablets (50-100 mg total) by mouth every 12 (twelve) hours as needed. 07/19/23   Jule Ronal CROME, PA-C  calcium  carbonate 200 MG capsule TWICE WEEKLY  08/29/11  [provider]  ranitidine (ZANTAC) 150 MG capsule Take 150 mg by mouth every evening.  08/29/11  [provider]    Physical Exam: Vitals:   03/13/24 1602 03/13/24 1700 03/13/24 1706 03/13/24 1808  BP:  139/80  (!) 143/86  Pulse:  (!) 113  (!) 106  Resp:  (!) 23  18  Temp: 98.4 F (36.9 C)  98.4 F (36.9 C) 98.7 F (37.1 C)  TempSrc: Oral  Oral Oral  SpO2:  93%  97%  Weight:      Height:       Constitutional: Acutely ill looking NAD, calm, comfortable Eyes: PERRL, lids and conjunctivae normal ENMT: Mucous membranes are moist. Posterior pharynx clear of any exudate or lesions.Normal dentition.  Neck: normal, supple, no masses, no thyromegaly Respiratory: clear to auscultation bilaterally, no  wheezing, no crackles. Normal respiratory effort. No accessory muscle use.  Cardiovascular: Sinus tachycardia, no murmurs / rubs / gallops. No extremity edema. 2+ pedal pulses. No carotid bruits.  Abdomen: Mild diffuse tenderness, no masses palpated. No hepatosplenomegaly. Bowel sounds positive.  Musculoskeletal: Good range of motion, no joint swelling or tenderness, Skin: no rashes, lesions, ulcers. No induration Neurologic: CN 2-12 grossly intact. Sensation intact, DTR normal. Strength 5/5 in all 4.  Psychiatric: Normal judgment and insight. Alert and oriented x 3. Normal mood  Data Reviewed:  Heart rate of 119, respirate of 23, white count is 11,000 glucose 133.  Lactic acid 3.7,.  Urinalysis showed positive for nitrite.  WBC 6-10.  CT abdomen pelvis showed diverticulitis of distal sigmoid colon but no abscess formation  Assessment and Plan:  #1 sepsis due to UTI and diverticulitis: Patient meets sepsis criteria with a white count of 11,000 and a heart rate of 119.  Also respiratory of 23.  CT evidence of diverticulitis and urinalysis positive for UTI.  Patient will be admitted with a diagnosis of both.  And will be initiated on relevant treatment.  #2 acute sigmoid diverticulitis: Patient will be on full liquid diet for now.  Initiate Rocephin and Flagyl .  Pain control as necessary.  Supportive care.  Monitor H&H.  #3 UTI: Partially treated with Augmentin .  Patient will be on IV Rocephin.  Follow urine cultures and blood cultures.  #4 non-insulin -dependent diabetes: Initiate sliding scale insulin  and monitor.  #5 essential hypertension: Continue home regimen  #6 morbid obesity: Dietary counseling  #7 GERD: Continue with PPIs  #8 hyperlipidemia: Resume home regimen when stable.    Advance Care Planning:   Code Status: Full Code   Consults: None  Family Communication: No family at bedside  Severity of Illness: The appropriate patient status for this patient is INPATIENT.  Inpatient status is judged to be reasonable and necessary in order to  provide the required intensity of service to ensure the patient's safety. The patient's presenting symptoms, physical exam findings, and initial radiographic and laboratory data in the context of their chronic comorbidities is felt to place them at high risk for further clinical deterioration. Furthermore, it is not anticipated that the patient will be medically stable for discharge from the hospital within 2 midnights of admission.   * I certify that at the point of admission it is my clinical judgment that the patient will require inpatient hospital care spanning beyond 2 midnights from the point of admission due to high intensity of service, high risk for further deterioration and high frequency of surveillance required.*  AuthorBETHA SIM KNOLL, MD 03/13/2024 6:45 PM  For on call review [email or web address].

## 2024-03-14 DIAGNOSIS — Z90710 Acquired absence of both cervix and uterus: Secondary | ICD-10-CM | POA: Diagnosis not present

## 2024-03-14 DIAGNOSIS — Z885 Allergy status to narcotic agent status: Secondary | ICD-10-CM | POA: Diagnosis not present

## 2024-03-14 DIAGNOSIS — Z96642 Presence of left artificial hip joint: Secondary | ICD-10-CM | POA: Diagnosis present

## 2024-03-14 DIAGNOSIS — N3000 Acute cystitis without hematuria: Secondary | ICD-10-CM | POA: Diagnosis present

## 2024-03-14 DIAGNOSIS — E119 Type 2 diabetes mellitus without complications: Secondary | ICD-10-CM | POA: Diagnosis present

## 2024-03-14 DIAGNOSIS — A419 Sepsis, unspecified organism: Secondary | ICD-10-CM | POA: Diagnosis present

## 2024-03-14 DIAGNOSIS — K219 Gastro-esophageal reflux disease without esophagitis: Secondary | ICD-10-CM | POA: Diagnosis present

## 2024-03-14 DIAGNOSIS — Z7984 Long term (current) use of oral hypoglycemic drugs: Secondary | ICD-10-CM | POA: Diagnosis not present

## 2024-03-14 DIAGNOSIS — E785 Hyperlipidemia, unspecified: Secondary | ICD-10-CM | POA: Diagnosis present

## 2024-03-14 DIAGNOSIS — K5732 Diverticulitis of large intestine without perforation or abscess without bleeding: Secondary | ICD-10-CM | POA: Diagnosis present

## 2024-03-14 DIAGNOSIS — I10 Essential (primary) hypertension: Secondary | ICD-10-CM

## 2024-03-14 DIAGNOSIS — Z83438 Family history of other disorder of lipoprotein metabolism and other lipidemia: Secondary | ICD-10-CM | POA: Diagnosis not present

## 2024-03-14 DIAGNOSIS — Z882 Allergy status to sulfonamides status: Secondary | ICD-10-CM | POA: Diagnosis not present

## 2024-03-14 DIAGNOSIS — Z7985 Long-term (current) use of injectable non-insulin antidiabetic drugs: Secondary | ICD-10-CM | POA: Diagnosis not present

## 2024-03-14 DIAGNOSIS — Z833 Family history of diabetes mellitus: Secondary | ICD-10-CM | POA: Diagnosis not present

## 2024-03-14 DIAGNOSIS — Z8419 Family history of other disorders of kidney and ureter: Secondary | ICD-10-CM | POA: Diagnosis not present

## 2024-03-14 DIAGNOSIS — Z888 Allergy status to other drugs, medicaments and biological substances status: Secondary | ICD-10-CM | POA: Diagnosis not present

## 2024-03-14 DIAGNOSIS — Z832 Family history of diseases of the blood and blood-forming organs and certain disorders involving the immune mechanism: Secondary | ICD-10-CM | POA: Diagnosis not present

## 2024-03-14 DIAGNOSIS — Z8249 Family history of ischemic heart disease and other diseases of the circulatory system: Secondary | ICD-10-CM | POA: Diagnosis not present

## 2024-03-14 DIAGNOSIS — K5792 Diverticulitis of intestine, part unspecified, without perforation or abscess without bleeding: Secondary | ICD-10-CM | POA: Diagnosis present

## 2024-03-14 DIAGNOSIS — Z8051 Family history of malignant neoplasm of kidney: Secondary | ICD-10-CM | POA: Diagnosis not present

## 2024-03-14 DIAGNOSIS — Z823 Family history of stroke: Secondary | ICD-10-CM | POA: Diagnosis not present

## 2024-03-14 LAB — COMPREHENSIVE METABOLIC PANEL WITH GFR
ALT: 20 U/L (ref 0–44)
AST: 15 U/L (ref 15–41)
Albumin: 3.3 g/dL — ABNORMAL LOW (ref 3.5–5.0)
Alkaline Phosphatase: 58 U/L (ref 38–126)
Anion gap: 11 (ref 5–15)
BUN: 10 mg/dL (ref 6–20)
CO2: 24 mmol/L (ref 22–32)
Calcium: 8.8 mg/dL — ABNORMAL LOW (ref 8.9–10.3)
Chloride: 102 mmol/L (ref 98–111)
Creatinine, Ser: 0.91 mg/dL (ref 0.44–1.00)
GFR, Estimated: >60 mL/min (ref >60)
Glucose, Bld: 110 mg/dL — ABNORMAL HIGH (ref 70–99)
Potassium: 3.7 mmol/L (ref 3.5–5.1)
Sodium: 137 mmol/L (ref 135–145)
Total Bilirubin: 0.7 mg/dL (ref 0.0–1.2)
Total Protein: 6 g/dL — ABNORMAL LOW (ref 6.5–8.1)

## 2024-03-14 LAB — URINE CULTURE: Culture: NO GROWTH

## 2024-03-14 LAB — CBC
HCT: 33.5 % — ABNORMAL LOW (ref 36.0–46.0)
Hemoglobin: 10.9 g/dL — ABNORMAL LOW (ref 12.0–15.0)
MCH: 27.2 pg (ref 26.0–34.0)
MCHC: 32.5 g/dL (ref 30.0–36.0)
MCV: 83.5 fL (ref 80.0–100.0)
Platelets: 211 K/uL (ref 150–400)
RBC: 4.01 MIL/uL (ref 3.87–5.11)
RDW: 13.6 % (ref 11.5–15.5)
WBC: 6.7 K/uL (ref 4.0–10.5)
nRBC: 0 % (ref 0.0–0.2)

## 2024-03-14 LAB — GLUCOSE, CAPILLARY
Glucose-Capillary: 114 mg/dL — ABNORMAL HIGH (ref 70–99)
Glucose-Capillary: 113 mg/dL — ABNORMAL HIGH (ref 70–99)
Glucose-Capillary: 183 mg/dL — ABNORMAL HIGH (ref 70–99)
Glucose-Capillary: 139 mg/dL — ABNORMAL HIGH (ref 70–99)

## 2024-03-14 MED ORDER — PROMETHAZINE HCL 25 MG/ML IJ SOLN
12.5000 mg | Freq: Four times a day (QID) | INTRAMUSCULAR | Status: DC | PRN
  Administered 2024-03-14: 12.5 mg via INTRAVENOUS
  Filled 2024-03-14: qty 12.5

## 2024-03-14 MED ORDER — HYDROMORPHONE HCL 1 MG/ML IJ SOLN
1.0000 mg | Freq: Once | INTRAMUSCULAR | Status: AC
  Administered 2024-03-14: 1 mg via INTRAVENOUS
  Filled 2024-03-14: qty 1

## 2024-03-14 NOTE — Plan of Care (Signed)

## 2024-03-14 NOTE — Progress Notes (Signed)
 [facility]  PROGRESS NOTE  Angelica Sharp FMW:[id number] DOB: 23-Jul-1965 DOA: 03/13/2024 PCP: Duwaine Burnard Amble, NP   Brief HPI:    58 y.o. female with medical history significant of diverticular disease, GERD, hyperlipidemia, essential hypertension, osteoarthritis, degenerative disc disease, non-insulin -dependent diabetes, anxiety disorder, history of fatty liver who was transferred from [facility] where she went with abdominal pain.  Patient has been having lower abdominal pain for the last 3 days.  Associated with some nausea but no vomiting.  She also has some dysuria and urine frequency.  Patient had a virtual visit with her provider they prescribed Augmentin  yesterday.  So far she has taken 4 doses.  The urinary symptoms seems to have improved but then continuously had more pain in the lower abdomen.  She has had previous diverticulitis and was worried that she may have another 1.  She came to the ER for evaluation.  In the ER workup showed evidence of UTI but CT abdomen pelvis also showed distal sigmoid diverticulitis.  Patient is therefore admitted to the hospital for evaluation and treatment.  She denied any bright red blood per rectum.  Denied any melena.  Pain is subsiding.  More discomfort.      Assessment/Plan:   #1 sepsis due to UTI and diverticulitis: Patient meets sepsis criteria with a white count of 11,000 and a heart rate of 119.   CT evidence of diverticulitis and urinalysis positive for UTI.  Continue Rocephin and Zithromax .  Will advance diet to full liquid diet today   #2 acute sigmoid diverticulitis: Patient will be on full liquid diet for now.  Continue Rocephin and Flagyl .  Pain control as necessary.  Supportive care.     #3 UTI: Partially treated with Augmentin .  Patient will be on IV Rocephin.  Urine culture is currently pending   #4 non-insulin -dependent diabetes: Initiate sliding scale insulin  and monitor.  CBG well-controlled   #5 essential  hypertension: Continue home regimen   #6 morbid obesity: Dietary counseling   #7 GERD: Continue with PPIs   #8 hyperlipidemia: Resume home regimen when stable.      Medications     insulin  aspart  0-15 Units Subcutaneous TID WC   insulin  aspart  0-5 Units Subcutaneous QHS     Data Reviewed:   CBG:  Recent Labs  Lab 03/13/24 2012  GLUCAP 177*    SpO2: 94 %    Vitals:   03/13/24 1808 03/13/24 2011 03/13/24 2323 03/14/24 0348  BP: (!) 143/86 131/65 128/70 121/66  Pulse: (!) 106 80 79 81  Resp: 18 20 20 20   Temp: 98.7 F (37.1 C) 99.2 F (37.3 C) 98.5 F (36.9 C) 98.2 F (36.8 C)  TempSrc: Oral Oral Oral Oral  SpO2: 97% 97% 96% 94%  Weight:      Height:          Data Reviewed:  Basic Metabolic Panel: Recent Labs  Lab 03/13/24 1242  NA 138  K 4.2  CL 98  CO2 25  GLUCOSE 133*  BUN 10  CREATININE 1.00  CALCIUM  9.7    CBC: Recent Labs  Lab 03/13/24 1242 03/14/24 0721  WBC 11.0* 6.7  HGB 13.9 10.9*  HCT 43.6 33.5*  MCV 84.5 83.5  PLT 296 211    LFT Recent Labs  Lab 03/13/24 1242  AST 23  ALT 26  ALKPHOS 87  BILITOT 0.5  PROT 7.5  ALBUMIN 4.5     Antibiotics: Anti-infectives (From admission, onward)  Start     Dose/Rate Route Frequency Ordered Stop   03/14/24 1330  cefTRIAXone (ROCEPHIN) 2 g in sodium chloride  0.9 % 100 mL IVPB        2 g 200 mL/hr over 30 Minutes Intravenous Every 24 hours 03/13/24 1844     03/14/24 0300  metroNIDAZOLE  (FLAGYL ) IVPB 500 mg        500 mg 100 mL/hr over 60 Minutes Intravenous Every 12 hours 03/13/24 1844     03/13/24 1530  metroNIDAZOLE  (FLAGYL ) IVPB 500 mg        500 mg 100 mL/hr over 60 Minutes Intravenous  Once 03/13/24 1523 03/13/24 1637   03/13/24 1345  cefTRIAXone (ROCEPHIN) 1 g in sodium chloride  0.9 % 100 mL IVPB        1 g 200 mL/hr over 30 Minutes Intravenous  Once 03/13/24 1334 03/13/24 1421        DVT prophylaxis: SCDs  Code Status: Full code  Family Communication: No  family at bedside   CONSULTS    Subjective   Pain has improved   Objective    Physical Examination:   General-appears in no acute distress Heart-S1-S2, regular, no murmur auscultated Lungs-clear to auscultation bilaterally, no wheezing or crackles auscultated Abdomen-soft, mild tenderness in left lower quadrant Extremities-no edema in the lower extremities Neuro-alert, oriented x3, no focal deficit noted  Status is: Inpatient:             Sabas GORMAN Brod   [facility] If 7PM-7AM, please contact night-coverage at [email or web address], Office  [phone]   03/14/2024, 8:24 AM  LOS: 0 days

## 2024-03-15 LAB — CBC
HCT: 33.4 % — ABNORMAL LOW (ref 36.0–46.0)
Hemoglobin: 10.7 g/dL — ABNORMAL LOW (ref 12.0–15.0)
MCH: 27.1 pg (ref 26.0–34.0)
MCHC: 32 g/dL (ref 30.0–36.0)
MCV: 84.6 fL (ref 80.0–100.0)
Platelets: 221 K/uL (ref 150–400)
RBC: 3.95 MIL/uL (ref 3.87–5.11)
RDW: 13.5 % (ref 11.5–15.5)
WBC: 6.4 K/uL (ref 4.0–10.5)
nRBC: 0 % (ref 0.0–0.2)

## 2024-03-15 LAB — BASIC METABOLIC PANEL WITH GFR
Anion gap: 10 (ref 5–15)
BUN: 6 mg/dL (ref 6–20)
CO2: 25 mmol/L (ref 22–32)
Calcium: 9.2 mg/dL (ref 8.9–10.3)
Chloride: 104 mmol/L (ref 98–111)
Creatinine, Ser: 0.92 mg/dL (ref 0.44–1.00)
GFR, Estimated: >60 mL/min (ref >60)
Glucose, Bld: 132 mg/dL — ABNORMAL HIGH (ref 70–99)
Potassium: 3.6 mmol/L (ref 3.5–5.1)
Sodium: 139 mmol/L (ref 135–145)

## 2024-03-15 LAB — GLUCOSE, CAPILLARY: Glucose-Capillary: 124 mg/dL — ABNORMAL HIGH (ref 70–99)

## 2024-03-15 MED ORDER — ONDANSETRON 4 MG PO TBDP: 4.0000 mg | ORAL_TABLET | Freq: Three times a day (TID) | ORAL | 0 refills | Status: AC | PRN

## 2024-03-15 MED ORDER — AMOXICILLIN-POT CLAVULANATE 875-125 MG PO TABS: 1.0000 | ORAL_TABLET | Freq: Two times a day (BID) | ORAL | 0 refills | Status: AC

## 2024-03-15 MED ORDER — ACETAMINOPHEN 325 MG PO TABS: 650.0000 mg | ORAL_TABLET | Freq: Four times a day (QID) | ORAL | Status: DC | PRN

## 2024-03-15 MED ORDER — ACETAMINOPHEN 325 MG PO TABS: 650.0000 mg | ORAL_TABLET | Freq: Four times a day (QID) | ORAL | Status: AC | PRN

## 2024-03-15 MED ORDER — HYDROCODONE-ACETAMINOPHEN 5-325 MG PO TABS: 1.0000 | ORAL_TABLET | Freq: Four times a day (QID) | ORAL | Status: DC | PRN

## 2024-03-15 MED ORDER — HYDROCODONE-ACETAMINOPHEN 5-325 MG PO TABS: 1.0000 | ORAL_TABLET | Freq: Four times a day (QID) | ORAL | 0 refills | Status: AC | PRN

## 2024-03-15 NOTE — Plan of Care (Signed)

## 2024-03-15 NOTE — Discharge Summary (Signed)
 Physician Discharge Summary   Patient: Angelica Sharp MRN: [id number] DOB: 09/12/1965  Admit date:     03/13/2024  Discharge date: 03/15/2024  Discharge Physician: Burnard DELENA Cunning   PCP: Duwaine Burnard Amble, NP   Recommendations at discharge:    Follow up with PCP in 1-2 weeks Repeat CBC, CMP at follow up Follow up on resolution of acute diverticulitis  Discharge Diagnoses: Principal Problem:   Diverticulitis Active Problems:   GERD (gastroesophageal reflux disease)   Hyperlipidemia   DM II (diabetes mellitus, type II), controlled (HCC)   Essential hypertension   UTI (urinary tract infection)  Resolved Problems:   * No resolved hospital problems. *  Hospital Course: 58 y.o. female with medical history significant of diverticular disease, GERD, hyperlipidemia, essential hypertension, osteoarthritis, degenerative disc disease, non-insulin -dependent diabetes, anxiety disorder, history of fatty liver who was transferred from [facility] where she went with abdominal pain.  Patient has been having lower abdominal pain for the last 3 days.  Associated with some nausea but no vomiting.  She also has some dysuria and urine frequency.  Patient had a virtual visit with her provider they prescribed Augmentin  yesterday.  So far she has taken 4 doses.  The urinary symptoms seems to have improved but then continuously had more pain in the lower abdomen.  She has had previous diverticulitis and was worried that she may have another 1.  She came to the ER for evaluation.  In the ER workup showed evidence of UTI but CT abdomen pelvis also showed distal sigmoid diverticulitis.  Patient is therefore admitted to the hospital for evaluation and treatment.  She denied any bright red blood per rectum.  Denied any melena.  Pain is subsiding.  More discomfort.  Further hospital course and management as outlined below.   10/5 -- pt doing overall well, improved.  No acute complaints.  Medically stable  for discharge.     Assessment and Plan:  Sepsis due to UTI and acute sigmoid diverticulitis: Patient meets sepsis criteria with a white count of 11,000 and a heart rate of 119.   CT evidence of diverticulitis and urinalysis positive for UTI.  Treated with IV Rocephin and Flagyl .   Discharge on 7 more days PO Augmentin . Diet advanced to soft and tolerating well.    Non-insulin -dependent diabetes: Initiate sliding scale insulin  and monitor.  CBG well-controlled. Resume home regimen at d/c.   Essential hypertension: Continue home regimen   Morbid obesity: Dietary counseling   GERD: Continue with PPIs   Hyperlipidemia: on statin       Consultants: None Procedures performed: None  Disposition: Home Diet recommendation:  Discharge Diet Orders (From admission, onward)     Start     Ordered   03/15/24 0000  Diet - soft / low fiber diet       03/15/24 1113            DISCHARGE MEDICATION: Allergies as of 03/15/2024       Reactions   Sulfa Antibiotics Hives, Other (See Comments)   Heart Rate goes up   Morphine And Codeine Other (See Comments)   Headache and hallucinations   Ozempic (0.25 Or 0.5 Mg-dose) [semaglutide(0.25 Or 0.5mg -dos)]    UTI   Nitrofurantoin  Hives, Rash   Septra [sulfamethoxazole-trimethoprim] Other (See Comments)   TACHYCARDIA        Medication List     STOP taking these medications    amoxicillin -clavulanate 500-125 MG tablet Commonly known as: Augmentin    hydrOXYzine  25 MG tablet Commonly known as: ATARAX   traMADol  50 MG tablet Commonly known as: ULTRAM        TAKE these medications    acetaminophen  325 MG tablet Commonly known as: TYLENOL  Take 2 tablets (650 mg total) by mouth every 6 (six) hours as needed for mild pain (pain score 1-3), headache or fever.   CALCIUM +D3 PO Take 1 tablet by mouth daily.   glipiZIDE 5 MG 24 hr tablet Commonly known as: GLUCOTROL XL Take 5 mg by mouth daily.   glucose blood test  strip Use as instructed   hydrochlorothiazide  25 MG tablet Commonly known as: HYDRODIURIL  Take 1 tablet by mouth once daily   HYDROcodone -acetaminophen  5-325 MG tablet Commonly known as: NORCO/VICODIN Take 1-2 tablets by mouth every 6 (six) hours as needed for severe pain (pain score 7-10) or moderate pain (pain score 4-6).   IRON PO Take by mouth.   loperamide 2 MG tablet Commonly known as: IMODIUM A-D Take 4 mg by mouth 4 (four) times daily as needed for diarrhea or loose stools.   Melatonin 5 MG Caps Take 1 capsule by mouth daily as needed (for sleep).   metFORMIN  500 MG tablet Commonly known as: GLUCOPHAGE  Take 1,000 mg by mouth 2 (two) times daily.   omeprazole  40 MG capsule Commonly known as: PRILOSEC Take 40 mg by mouth daily.   ondansetron  4 MG disintegrating tablet Commonly known as: ZOFRAN -ODT Take 1 tablet (4 mg total) by mouth every 8 (eight) hours as needed for nausea or vomiting.   oxybutynin 5 MG tablet Commonly known as: DITROPAN Take 5 mg by mouth daily.   polyethylene glycol 17 g packet Commonly known as: MIRALAX  / GLYCOLAX  Take 17 g by mouth daily as needed for mild constipation.   Potassium 99 MG Tabs Take 198 mg by mouth daily.   pravastatin  40 MG tablet Commonly known as: PRAVACHOL  TAKE 1 TABLET BY MOUTH AT BEDTIME       ASK your doctor about these medications    amoxicillin -clavulanate 875-125 MG tablet Commonly known as: AUGMENTIN  Take 1 tablet by mouth 2 (two) times daily for 7 days. Ask about: Should I take this medication?        Discharge Exam: Filed Weights   03/13/24 1218  Weight: 89.8 kg   General exam: awake, alert, no acute distress HEENT: moist mucus membranes, hearing grossly normal  Respiratory system: CTAB, no wheezes, rales or rhonchi, normal respiratory effort. Cardiovascular system: normal S1/S2, RRR Gastrointestinal system: soft, NT, ND, no HSM felt, +bowel sounds. Central nervous system: A&O x3. no gross  focal neurologic deficits, normal speech Extremities: moves all , no edema, normal tone Skin: dry, intact, normal temperature Psychiatry: normal mood, congruent affect, judgement and insight appear normal   Condition at discharge: stable  The results of significant diagnostics from this hospitalization (including imaging, microbiology, ancillary and laboratory) are listed below for reference.   Imaging Studies: CT ABDOMEN PELVIS W CONTRAST Result Date: 03/13/2024 CLINICAL DATA:  Acute lower abdominal pain for 3 days.  Diarrhea. EXAM: CT ABDOMEN AND PELVIS WITH CONTRAST TECHNIQUE: Multidetector CT imaging of the abdomen and pelvis was performed using the standard protocol following bolus administration of intravenous contrast. RADIATION DOSE REDUCTION: This exam was performed according to the departmental dose-optimization program which includes automated exposure control, adjustment of the mA and/or kV according to patient size and/or use of iterative reconstruction technique. CONTRAST:  OMNIPAQUE  IOHEXOL  300 MG/ML  SOLN COMPARISON:  September 28, 2022. FINDINGS: Lower  chest: No acute abnormality. Hepatobiliary: Stable small left hepatic cyst. No cholelithiasis or biliary dilatation is noted. Pancreas: Unremarkable. No pancreatic ductal dilatation or surrounding inflammatory changes. Spleen: Normal in size without focal abnormality. Adrenals/Urinary Tract: Adrenal glands are unremarkable. Kidneys are normal, without renal calculi, focal lesion, or hydronephrosis. Bladder is unremarkable. Stomach/Bowel: The stomach and appendix are unremarkable. There is no evidence of bowel obstruction. Diverticulosis of descending and sigmoid colon is noted. Diverticulitis of distal sigmoid colon is noted without definite abscess formation. Vascular/Lymphatic: No significant vascular findings are present. No enlarged abdominal or pelvic lymph nodes. Reproductive: Status post hysterectomy. No adnexal masses. Other: No  ascites or hernia is noted. Musculoskeletal: Status post left total hip arthroplasty. IMPRESSION: Diverticulitis of distal sigmoid colon is noted without definite abscess formation. Electronically Signed   By: Lynwood Landy Raddle M.D.   On: 03/13/2024 15:19    Microbiology: Results for orders placed or performed during the hospital encounter of 03/13/24  Urine Culture     Status: None   Collection Time: 03/13/24  1:34 PM   Specimen: Urine, Clean Catch  Result Value Ref Range Status   Specimen Description   Final    URINE, CLEAN CATCH Performed at [facility], [address] [place], [place] [address]    Special Requests   Final    NONE Performed at [facility], [address] [place], [place] [address]    Culture   Final    NO GROWTH Performed at [facility], [address], [place], [place] [address]    Report Status 03/14/2024 FINAL  Final    Labs: CBC: No results for input(s): WBC, NEUTROABS, HGB, HCT, MCV, PLT in the last 168 hours.  Basic Metabolic Panel: No results for input(s): NA, K, CL, CO2, GLUCOSE, BUN, CREATININE, CALCIUM , MG, PHOS in the last 168 hours.  Liver Function Tests: No results for input(s): AST, ALT, ALKPHOS, BILITOT, PROT, ALBUMIN in the last 168 hours.  CBG: No results for input(s): GLUCAP in the last 168 hours.   Discharge time spent: less than 30 minutes.  Signed: Burnard DELENA Cunning, DO [facility] 03/28/2024

## 2024-04-01 ENCOUNTER — Encounter: Payer: Self-pay | Admitting: Family

## 2024-04-01 ENCOUNTER — Other Ambulatory Visit: Payer: Self-pay | Admitting: Family

## 2024-04-01 DIAGNOSIS — R1032 Left lower quadrant pain: Secondary | ICD-10-CM

## 2024-04-01 DIAGNOSIS — K5792 Diverticulitis of intestine, part unspecified, without perforation or abscess without bleeding: Secondary | ICD-10-CM

## 2024-04-01 DIAGNOSIS — R197 Diarrhea, unspecified: Secondary | ICD-10-CM

## 2024-04-07 ENCOUNTER — Ambulatory Visit
Admission: RE | Admit: 2024-04-07 | Discharge: 2024-04-07 | Disposition: A | Discharge status: Home / Self Care | Source: Ambulatory Visit | Attending: Family | Admitting: Family

## 2024-04-07 DIAGNOSIS — R1032 Left lower quadrant pain: Secondary | ICD-10-CM

## 2024-04-07 DIAGNOSIS — K5792 Diverticulitis of intestine, part unspecified, without perforation or abscess without bleeding: Secondary | ICD-10-CM

## 2024-04-07 DIAGNOSIS — R197 Diarrhea, unspecified: Secondary | ICD-10-CM

## 2024-04-13 ENCOUNTER — Encounter: Payer: Self-pay | Admitting: Radiology

## 2024-04-13 ENCOUNTER — Telehealth: Payer: Self-pay | Admitting: Physical Medicine and Rehabilitation

## 2024-04-13 ENCOUNTER — Encounter: Payer: Self-pay | Admitting: Orthopaedic Surgery

## 2024-04-13 NOTE — Telephone Encounter (Signed)
 See other message

## 2024-04-13 NOTE — Telephone Encounter (Signed)
 Patient called and said she wants to get schedule for cortisone shot. CB#[phone]

## 2024-04-14 NOTE — Telephone Encounter (Signed)
 Made her an appt with Dr. Burnetta

## 2024-04-14 NOTE — Telephone Encounter (Signed)
 Looks like it was brooks who did a hip joint injection.

## 2024-04-14 NOTE — Telephone Encounter (Signed)
 Did you do her inj?

## 2024-04-28 ENCOUNTER — Other Ambulatory Visit: Payer: Self-pay

## 2024-04-28 ENCOUNTER — Encounter: Payer: Self-pay | Admitting: Sports Medicine

## 2024-04-28 ENCOUNTER — Ambulatory Visit: Admitting: Sports Medicine

## 2024-04-28 ENCOUNTER — Telehealth: Payer: Self-pay | Admitting: Orthopaedic Surgery

## 2024-04-28 DIAGNOSIS — G8929 Other chronic pain: Secondary | ICD-10-CM

## 2024-04-28 DIAGNOSIS — M25551 Pain in right hip: Secondary | ICD-10-CM | POA: Diagnosis not present

## 2024-04-28 DIAGNOSIS — M1611 Unilateral primary osteoarthritis, right hip: Secondary | ICD-10-CM

## 2024-04-28 DIAGNOSIS — E119 Type 2 diabetes mellitus without complications: Secondary | ICD-10-CM

## 2024-04-28 MED ORDER — LIDOCAINE HCL 1 % IJ SOLN
4.0000 mL | INTRAMUSCULAR | Status: AC | PRN
  Administered 2024-04-28: 4 mL

## 2024-04-28 MED ORDER — METHYLPREDNISOLONE ACETATE 40 MG/ML IJ SUSP
60.0000 mg | INTRAMUSCULAR | Status: AC | PRN
  Administered 2024-04-28: 60 mg via INTRA_ARTICULAR

## 2024-04-28 NOTE — Telephone Encounter (Signed)
 Patient is requesting extension on restrictions of driving distance of 35 min from home to work and return puts stress on patients lower back and hips. Walking 3.5 minutes from car to work site also increases patients pain in hip and back. Also, to work remotely with ability to be onsite for critical activities. Please advise. Thank you!

## 2024-04-28 NOTE — Progress Notes (Signed)
 Patient says that she got great relief from the most recent hip injection. She has had pain for about a month in the right hip. She takes oral Voltaren  twice daily. She is here for repeat hip injection today.

## 2024-04-28 NOTE — Progress Notes (Signed)
 Angelica Sharp - 58 y.o. female MRN [id number]  Date of birth: Jun 09, 1966  Office Visit Note: Visit Date: 04/28/2024 PCP: Duwaine Burnard Amble, NP Referred by: Duwaine Burnard Amble, NP  Subjective: Chief Complaint  Patient presents with   Right Hip - Pain   HPI: Angelica Sharp is a pleasant 58 y.o. female who presents today for acute on chronic right hip pain.  Angelica Sharp is status post left total hip replacement with Dr. Jerri.  She has had right hip pain since 2024 which has arthritic change but is not yet advanced.  She also has a known degenerative labral tear.  She has done well in the past from previous injections, the last was back in June.  She is interested in repeating this.  In terms of medication, she does use oral diclofenac  twice daily.  She is a type II diabetic, but is well-controlled as of late.  Managed on glipizide XL 5 mg daily as well as metformin  at 1000 mg twice daily.  Lab Results  Component Value Date   HGBA1C 6.4 (H) 03/13/2024   Pertinent ROS were reviewed with the patient and found to be negative unless otherwise specified above in HPI.   Assessment & Plan: Visit Diagnoses:  1. Unilateral primary osteoarthritis, right hip   2. Chronic right hip pain   3. Controlled type 2 diabetes mellitus without complication, without long-term current use of insulin  (HCC)    Plan: Impression is acute exacerbation of chronic right hip pain which does have mild osteoarthritis as well as degenerative labral tear.  She has found good relief from intermittent injections in the past, through shared decision making we did proceed with ultrasound-guided intra-articular hip injection.  Patient tolerated well, advised on postinjection protocol.  She may use ice/heat as well as her oral diclofenac  twice daily for this and her overall joint pains.  She is a type II diabetic, but well-controlled, continue her medications as indicated and follow-up with PCP.  In terms of the hip, she may see me back as  needed.  She is not at an advanced enough point in her arthritis to consider hip replacement but this could be something down the road that may be needed.  Follow-up: Return if symptoms worsen or fail to improve.   Meds & Orders: No orders of the defined types were placed in this encounter.   Orders Placed This Encounter  Procedures   Large Joint Inj: R hip joint   US  Guided Needle Placement - No Linked Charges     Procedures: Large Joint Inj: R hip joint on 04/28/2024 4:00 PM Indications: pain Details: 22 G 3.5 in needle, ultrasound-guided anterior approach Medications: 4 mL lidocaine  1 %; 60 mg methylPREDNISolone  acetate 40 MG/ML Outcome: tolerated well, no immediate complications  Procedure: US -guided intra-articular hip injection, Right After discussion on risks/benefits/indications and informed verbal consent was obtained, a timeout was performed. Patient was lying supine on exam table. The hip was cleaned with betadine  and alcohol swabs. Then utilizing ultrasound guidance, the patient's femoral head and neck junction was identified and subsequently injected with 4:1.5 lidocaine :depomedrol via an in-plane approach with ultrasound visualization of the injectate administered into the hip joint. Patient tolerated procedure well without immediate complications.  Procedure, treatment alternatives, risks and benefits explained, specific risks discussed. Consent was given by the patient. Immediately prior to procedure a time out was called to verify the correct patient, procedure, equipment, support staff and site/side marked as required. Patient was prepped and draped in the  usual sterile fashion.          Clinical History:  Recent Labs    05/21/23 1613 03/13/24 2042  HGBA1C  --  6.4*  LABURIC 8.3*  --     Objective:    Physical Exam  Gen: Well-appearing, in no acute distress; non-toxic CV: Well-perfused. Warm.  Resp: Breathing unlabored on room air; no wheezing. Psych:  Fluid speech in conversation; appropriate affect; normal thought process  Ortho Exam - Right hip: No redness or swelling noted.  There is about 5 to 7 degrees less of the internal range of motion with logroll testing, intact external rotation.  Positive FADIR and Stinchfield testing, negative FABER testing.  Imaging:  *I did personally review the right hip MRI during the visit today, agree with below findings  Narrative & Impression  CLINICAL DATA:  Right hip pain and weakness.   EXAM: MRI OF THE RIGHT HIP WITH CONTRAST   TECHNIQUE: Multiplanar, multisequence MR imaging was performed following the administration of intra-articular contrast.   CONTRAST:  See hip injection documentation   COMPARISON:  Radiographs 03/21/2023   FINDINGS: Bones: Metal artifact from left total hip prosthesis. Small synovial herniation pits along the junction of the right anterior femoral head and neck.   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   No significant regional marrow edema.   Articular cartilage and labrum   Articular cartilage: Three focal small chondral defects along the superior acetabular cartilage on the right. One of these on image 16 of series 108 is also associated with a small subcortical focus of marrow edema.   Labrum: Tear of the anterior superior labrum on images 24-25 of series 113 also shown on image 20 of series 112.   Bursae: No regional bursitis.   Muscles and tendons   Muscles and tendons:  Unremarkable   Other findings   Miscellaneous:   Sigmoid colon diverticulosis.   IMPRESSION: 1. Tear of the anterior superior acetabular labrum on the right. 2. 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 3. Three focal small chondral defects along the superior acetabular cartilage on the right. One of these is also associated with a small subcortical  focus of marrow edema. 4. Sigmoid colon diverticulosis.     Electronically Signed   By: Ryan Salvage M.D.   On: 05/10/2023 09:01    Past Medical/Family/Surgical/Social History: Medications & Allergies reviewed per EMR, new medications updated. 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   Fatty liver    GERD (gastroesophageal reflux disease)    H/O hiatal hernia    Headache    HLD (hyperlipidemia)    Hypertension    Pneumonia    PONV (postoperative nausea and vomiting)    SOB (shortness of breath)    Family History  Problem Relation Age of Onset   Heart attack Mother    Diabetes Mother    Hyperlipidemia Mother    Hypertension Mother    Stroke Sister    Pulmonary embolism Father    Clotting disorder Father    Diabetes Sister    Kidney disease Sister        kidney failure   Heart disease  Sister    Cancer Sister        abdominal   Diabetes Son 27       TYPE 1   Kidney cancer Sister    Crohn's disease Niece    Cancer Nephew        gallbladder   Stomach cancer Neg Hx    Rectal cancer Neg Hx    Esophageal cancer Neg Hx    Colon cancer Neg Hx    Past Surgical History:  Procedure Laterality Date   BREAST LUMPECTOMY     4 lumps -- two from right -- one from left   LAPAROSCOPIC ASSISTED VAGINAL HYSTERECTOMY N/A 09/18/2012   Procedure: LAPAROSCOPIC ASSISTED VAGINAL HYSTERECTOMY;  Surgeon: Duwaine Blumenthal, DO;  Location: [facility];  Service: Gynecology;  Laterality: N/A;  BLADDER  DISTENTION   TOTAL HIP ARTHROPLASTY Left 11/10/2018   Procedure: LEFT TOTAL HIP ARTHROPLASTY ANTERIOR APPROACH;  Surgeon: Jerri Kay HERO, MD;  Location: [facility];  Service: Orthopedics;  Laterality: Left;   TUBAL LIGATION     Social History   Occupational History   Occupation: Designer, Industrial/product: ORTHOPTIST    Comment: [employer] - now works from home  Tobacco Use   Smoking status: Never   Smokeless tobacco: Never  Vaping Use   Vaping status: Never Used  Substance and Sexual Activity   Alcohol use: No   Drug use: No   Sexual activity: Not Currently

## 2024-04-29 ENCOUNTER — Telehealth: Payer: Self-pay | Admitting: Orthopaedic Surgery

## 2024-04-29 NOTE — Telephone Encounter (Signed)
 Sure - that's fine

## 2024-04-29 NOTE — Telephone Encounter (Signed)
 Spoke with patient. She said that you have been doing this for her each year?

## 2024-04-29 NOTE — Telephone Encounter (Signed)
 How long does she want that restriction?

## 2024-04-29 NOTE — Telephone Encounter (Signed)
 Spoke with patient.

## 2024-04-29 NOTE — Telephone Encounter (Signed)
 Patient called and ask if you could call her back. CB# [phone]

## 2024-04-29 NOTE — Telephone Encounter (Signed)
Tried to call. No answer. LMOM for patient to call me back.

## 2024-04-30 NOTE — Telephone Encounter (Signed)
 Completed and faxed.

## 2024-04-30 NOTE — Telephone Encounter (Signed)
 Do you need another note in chart? Patient seems to think its just a check yes or no on her form?

## 2024-05-24 ENCOUNTER — Telehealth: Admitting: Family

## 2024-05-24 DIAGNOSIS — R197 Diarrhea, unspecified: Secondary | ICD-10-CM

## 2024-05-24 DIAGNOSIS — R109 Unspecified abdominal pain: Secondary | ICD-10-CM

## 2024-05-24 NOTE — Progress Notes (Signed)
°  Because of your abdominal pain and history, I feel your condition warrants further evaluation and I recommend that you be seen in a face-to-face visit.   NOTE: There will be NO CHARGE for this E-Visit   If you are having a true medical emergency, please call 911.     For an urgent face to face visit, [facility] has multiple urgent care centers for your convenience.  Click the link below for the full list of locations and hours, walk-in wait times, appointment scheduling options and driving directions:  Urgent Care - [place], [place], [place], [place], [place], [place]     Your MyChart E-visit questionnaire answers were reviewed by a board certified advanced clinical practitioner to complete your personal care plan based on your specific symptoms.    Thank you for using e-Visits.

## 2024-06-01 NOTE — Progress Notes (Unsigned)
 "  Chief Complaint: Hospital follow-up Primary GI MD: Dr. Albertus  HPI: Discussed the use of AI scribe software for clinical note transcription with the patient, who gave verbal consent to proceed.  History of Present Illness   Angelica Sharp is a 58 year old female with diverticulosis and recurrent diverticulitis who presents for follow-up after hospitalization for diverticulitis.  Hospitalized on March 13, 2024 for distal sigmoid diverticulitis confirmed by CT scan; also treated for urinary tract infection with bloodstream involvement and borderline sepsis. Received IV Rocephin  and Flagyl , then discharged on Augmentin . Significant improvement since hospitalization, though October was marked by illness and multiple antibiotic courses. Towards the end of October, restarted on antibiotics for diverticulitis, likely Augmentin , but discontinued final three days due to gastrointestinal intolerance.  Intermittent lower abdominal tenderness persists, longstanding and not constant, sometimes improving after bowel movements. Urology ultrasound showed no bladder pathology. Uses an over-the-counter antispasmodic similar to Imodium for cramping, which is effective; takes it for cramps rather than diarrhea.  Certain foods, including pickles, corn, and Fritos, trigger abdominal pain and sometimes diarrhea. Avoids these foods or eats them with other foods to minimize symptoms. Diarrhea is intermittent and associated with dietary triggers; bowel movements are generally good. No blood in stool and no pain with defecation.  Two hospitalizations for diverticulitis: most recently in October 2025 and previously ten to eleven years ago. Estimates episodes once or twice per year, though not all confirmed by CT scan. Last colonoscopy in July 2021 showed diverticulosis throughout the colon and one small precancerous polyp. No family history of colon cancer.  Not currently taking a fiber supplement.   PREVIOUS GI  WORKUP   Colonoscopy 01/04/2020 - One 6 mm polyp in the transverse colon, removed with a cold snare. Resected and retrieved.  - Diverticulosis in the sigmoid colon, in the descending colon and in the transverse colon.  - Internal hemorrhoids.  EGD 01/04/2020 - Z- line irregular, 38 cm from the incisors. Biopsied.  - 1 cm hiatal hernia.  - Multiple, benign, gastric polyps. Biopsied.  - Normal stomach. Biopsied.  - Normal examined duodenum. Biopsied.  Diagnosis  1. Surgical [P], duodenal - DUODENAL MUCOSA WITH NO SPECIFIC HISTOPATHOLOGIC CHANGES - NEGATIVE FOR INCREASED INTRAEPITHELIAL LYMPHOCYTES OR VILLOUS ARCHITECTURAL CHANGES  2. Surgical [P], gastric antrum and body - GASTRIC ANTRAL AND OXYNTIC MUCOSA WITH NO SPECIFIC HISTOPATHOLOGIC CHANGES - WARTHIN STARRY STAIN IS NEGATIVE FOR HELICOBACTER PYLORI  3. Surgical [P], gastric polyps - FUNDIC GLAND POLYP(S) - NEGATIVE FOR DYSPLASIA  4. Surgical [P], esophagus - ESOPHAGEAL SQUAMOUS AND CARDIAC MUCOSA WITH NO SPECIFIC HISTOPATHOLOGIC CHANGES - NEGATIVE FOR INTESTINAL METAPLASIA OR DYSPLASIA  5. Surgical [P], colon, transverse, polyp - TUBULAR ADENOMA(S) - NEGATIVE FOR HIGH-GRADE DYSPLASIA OR MALIGNANC  Past Medical History:  Diagnosis Date   Allergy    Anemia    Anxiety    Arthritis    Borderline hypertension    Chest pain    Complication of anesthesia    Degenerative lumbar disc    L5S1   Diabetes mellitus without complication (HCC)    Diverticulitis    Dyspnea    Edema    lower extremity   Family history of anesthesia complication    sister had BP complications during anes.   Fatty liver    GERD (gastroesophageal reflux disease)    H/O hiatal hernia    Headache    HLD (hyperlipidemia)    Hypertension    Pneumonia    PONV (postoperative nausea and vomiting)  SOB (shortness of breath)     Past Surgical History:  Procedure Laterality Date   BREAST LUMPECTOMY     4 lumps -- two from right -- one from left    LAPAROSCOPIC ASSISTED VAGINAL HYSTERECTOMY N/A 09/18/2012   Procedure: LAPAROSCOPIC ASSISTED VAGINAL HYSTERECTOMY;  Surgeon: Duwaine Blumenthal, DO;  Location: [facility];  Service: Gynecology;  Laterality: N/A;  BLADDER  DISTENTION   TOTAL HIP ARTHROPLASTY Left 11/10/2018   Procedure: LEFT TOTAL HIP ARTHROPLASTY ANTERIOR APPROACH;  Surgeon: Jerri Kay HERO, MD;  Location: [facility];  Service: Orthopedics;  Laterality: Left;   TUBAL LIGATION      Current Outpatient Medications  Medication Sig Dispense Refill   acetaminophen  (TYLENOL ) 325 MG tablet Take 2 tablets (650 mg total) by mouth every 6 (six) hours as needed for mild pain (pain score 1-3), headache or fever.     allopurinol (ZYLOPRIM) 300 MG tablet Take 300 mg by mouth daily.     Calcium  Carb-Cholecalciferol (CALCIUM +D3 PO) Take 1 tablet by mouth daily.     diclofenac  (VOLTAREN ) 75 MG EC tablet Take 75 mg by mouth 2 (two) times daily.     dicyclomine  (BENTYL ) 10 MG capsule Take 1 capsule (10 mg total) by mouth 4 (four) times daily -  before meals and at bedtime. 90 capsule 0   Ferrous Sulfate (IRON PO) Take by mouth.     glipiZIDE (GLUCOTROL XL) 5 MG 24 hr tablet Take 5 mg by mouth daily.     glucose blood test strip Use as instructed 100 each 3   hydrochlorothiazide  (HYDRODIURIL ) 25 MG tablet Take 1 tablet by mouth once daily 90 tablet 0   HYDROcodone -acetaminophen  (NORCO/VICODIN) 5-325 MG tablet Take 1-2 tablets by mouth every 6 (six) hours as needed for severe pain (pain score 7-10) or moderate pain (pain score 4-6). 20 tablet 0   loperamide (IMODIUM A-D) 2 MG tablet Take 4 mg by mouth 4 (four) times daily as needed for diarrhea or loose stools.     Melatonin 5 MG CAPS Take 1 capsule by mouth daily as needed (for sleep).     metFORMIN  (GLUCOPHAGE ) 500 MG tablet Take 1,000 mg by mouth 2 (two) times daily.     Na Sulfate-K Sulfate-Mg Sulfate concentrate (SUPREP) 17.5-3.13-1.6 GM/177ML SOLN Take 1 kit (354 mLs total) by mouth once for 1 dose. 354 mL 0    omeprazole  (PRILOSEC) 40 MG capsule Take 40 mg by mouth daily.     ondansetron  (ZOFRAN -ODT) 4 MG disintegrating tablet Take 1 tablet (4 mg total) by mouth every 8 (eight) hours as needed for nausea or vomiting. 20 tablet 0   oxybutynin (DITROPAN) 5 MG tablet Take 5 mg by mouth daily.     Potassium 99 MG TABS Take 198 mg by mouth daily.     pravastatin  (PRAVACHOL ) 40 MG tablet TAKE 1 TABLET BY MOUTH AT BEDTIME 90 tablet 0   promethazine  (PHENERGAN ) 25 MG tablet Take 25 mg by mouth 2 (two) times daily.     No current facility-administered medications for this visit.    Allergies as of 06/02/2024 - Review Complete 06/02/2024  Allergen Reaction Noted   Sulfa antibiotics Hives and Other (See Comments) 12/12/2014   Morphine and codeine Other (See Comments) 09/09/2012   Hydromorphone  Other (See Comments) 06/02/2024   Ozempic (0.25 or 0.5 mg-dose) [semaglutide(0.25 or 0.5mg -dos)]  03/14/2024   Nitrofurantoin  Hives and Rash 10/23/2022   Septra [sulfamethoxazole-trimethoprim] Other (See Comments) 09/09/2012    Family History  Problem Relation Age  of Onset   Heart attack Mother    Diabetes Mother    Hyperlipidemia Mother    Hypertension Mother    Pulmonary embolism Father    Clotting disorder Father    Stroke Sister    Diabetes Sister    Kidney disease Sister        kidney failure   Heart disease Sister    Cancer Sister        abdominal   Kidney cancer Sister    Liver disease Sister    Diabetes Son 99       TYPE 1   Cancer Nephew        gallbladder   Crohn's disease Niece    Stomach cancer Neg Hx    Rectal cancer Neg Hx    Esophageal cancer Neg Hx    Colon cancer Neg Hx     Social History   Socioeconomic History   Marital status: Divorced    Spouse name: Not on file   Number of children: 3   Years of education: Not on file   Highest education level: Not on file  Occupational History   Occupation: Mortgage    Employer: [employer]    Comment: [employer]  - now works from home  Tobacco Use   Smoking status: Never   Smokeless tobacco: Never  Vaping Use   Vaping status: Never Used  Substance and Sexual Activity   Alcohol use: No   Drug use: No   Sexual activity: Not Currently  Other Topics Concern   Not on file  Social History Narrative   Lives with daughter and grandkids.   Social Drivers of Health   Tobacco Use: Low Risk (04/28/2024)   Patient History    Smoking Tobacco Use: Never    Smokeless Tobacco Use: Never    Passive Exposure: Not on file  Financial Resource Strain: Not on file  Food Insecurity: No Food Insecurity (03/13/2024)   Epic    Worried About Programme Researcher, Broadcasting/film/video in the Last Year: Never true    Ran Out of Food in the Last Year: Never true  Transportation Needs: No Transportation Needs (03/13/2024)   Epic    Lack of Transportation (Medical): No    Lack of Transportation (Non-Medical): No  Physical Activity: Not on file  Stress: Not on file  Social Connections: Not on file  Intimate Partner Violence: Not At Risk (03/13/2024)   Epic    Fear of Current or Ex-Partner: No    Emotionally Abused: No    Physically Abused: No    Sexually Abused: No  Depression (PHQ2-9): Not on file  Alcohol Screen: Not on file  Housing: Low Risk (03/13/2024)   Epic    Unable to Pay for Housing in the Last Year: No    Number of Times Moved in the Last Year: 0    Homeless in the Last Year: No  Utilities: Not At Risk (03/13/2024)   Epic    Threatened with loss of utilities: No  Health Literacy: Not on file    Review of Systems:    Constitutional: No weight loss, fever, chills, weakness or fatigue HEENT: Eyes: No change in vision               Ears, Nose, Throat:  No change in hearing or congestion Skin: No rash or itching Cardiovascular: No chest pain, chest pressure or palpitations   Respiratory: No SOB or cough Gastrointestinal: See HPI and otherwise negative Genitourinary:  No dysuria or change in urinary  frequency Neurological: No headache, dizziness or syncope Musculoskeletal: No new muscle or joint pain Hematologic: No bleeding or bruising Psychiatric: No history of depression or anxiety    Physical Exam:  Vital signs: BP 122/70   Pulse 81   Ht 5' 7 (1.702 m)   Wt 201 lb 8 oz (91.4 kg)   LMP 08/10/2012 Comment: ovaries remain//a.c.  BMI 31.56 kg/m   Constitutional: NAD, alert and cooperative Head:  Normocephalic and atraumatic. Eyes:   PEERL, EOMI. No icterus. Conjunctiva pink. Respiratory: Respirations even and unlabored. Lungs clear to auscultation bilaterally.   No wheezes, crackles, or rhonchi.  Cardiovascular:  Regular rate and rhythm. No peripheral edema, cyanosis or pallor.  Gastrointestinal:  Soft, nondistended, nontender. No rebound or guarding. Normal bowel sounds. No appreciable masses or hepatomegaly. Rectal:  Declines Msk:  Symmetrical without gross deformities. Without edema, no deformity or joint abnormality.  Neurologic:  Alert and  oriented x4;  grossly normal neurologically.  Skin:   Dry and intact without significant lesions or rashes. Psychiatric: Oriented to person, place and time. Demonstrates good judgement and reason without abnormal affect or behaviors.  Physical Exam    RELEVANT LABS AND IMAGING: CBC    Component Value Date/Time   WBC 6.4 03/15/2024 0424   RBC 3.95 03/15/2024 0424   HGB 10.7 (L) 03/15/2024 0424   HGB 13.6 03/04/2020 1400   HCT 33.4 (L) 03/15/2024 0424   HCT 44.7 03/04/2020 1400   PLT 221 03/15/2024 0424   PLT 357 12/01/2019 1525   MCV 84.6 03/15/2024 0424   MCV 77 (L) 03/04/2020 1400   MCH 27.1 03/15/2024 0424   MCHC 32.0 03/15/2024 0424   RDW 13.5 03/15/2024 0424   RDW 16.1 (H) 03/04/2020 1400   LYMPHSABS 1.6 09/28/2022 0943   LYMPHSABS 1.8 03/04/2020 1400   MONOABS 0.6 09/28/2022 0943   EOSABS 0.1 09/28/2022 0943   EOSABS 0.1 03/04/2020 1400   BASOSABS 0.1 09/28/2022 0943   BASOSABS 0.1 03/04/2020 1400     CMP     Component Value Date/Time   NA 139 03/15/2024 0424   NA 140 09/25/2022 1606   K 3.6 03/15/2024 0424   CL 104 03/15/2024 0424   CO2 25 03/15/2024 0424   GLUCOSE 132 (H) 03/15/2024 0424   BUN 6 03/15/2024 0424   BUN 8 09/25/2022 1606   CREATININE 0.92 03/15/2024 0424   CREATININE 1.01 05/10/2015 1240   CALCIUM  9.2 03/15/2024 0424   PROT 6.0 (L) 03/14/2024 0721   PROT 7.3 03/04/2020 1400   ALBUMIN 3.3 (L) 03/14/2024 0721   ALBUMIN 4.4 03/04/2020 1400   AST 15 03/14/2024 0721   ALT 20 03/14/2024 0721   ALKPHOS 58 03/14/2024 0721   BILITOT 0.7 03/14/2024 0721   BILITOT <0.2 03/04/2020 1400   GFRNONAA >60 03/15/2024 0424   GFRNONAA 82 10/01/2014 1106   GFRAA 82 03/04/2020 1400   GFRAA >89 10/01/2014 1106     Assessment/Plan:   Diverticulitis Admission 03/13/2024 with CTAP showing distal sigmoid diverticulitis.  Incidentally she also had evidence of a UTI.  She was treated with IV Rocephin  and Flagyl  and discharged on p.o. Augmentin , given another round of Augmentin  later October from PCP.  Last colonoscopy 12/2019 with diverticulosis in sigmoid, descending, transverse colon. Has had at least 1-2 episodes of diverticulitis per year over the last few years since her colonoscopy and has occasional lower abdominal pain and diarrhea.  Suspect symptomatic diverticular disease.  Although she had  uncomplicated diverticulitis, due to frequent episodes of diverticulitis since last colonoscopy it is best we update her colonoscopy for further evaluation. - Schedule colonoscopy 6 to 8 weeks from last episode - Educated patient on diverticulosis and diverticulitis - Discussed possible surgical referral for symptomatic diverticular disease.  She continues to have episodes of diverticulitis we will highly recommend surgical referral - Will send in dicyclomine  10 mg 3 times daily to use as needed for lower abdominal pain and diarrhea, continue to avoid trigger foods - I thoroughly  discussed the procedure with the patient (at bedside) to include nature of the procedure, alternatives, benefits, and risks (including but not limited to bleeding, infection, perforation, anesthesia/cardiac pulmonary complications).  Patient verbalized understanding and gave verbal consent to proceed with procedure.   History of colon polyps Colonoscopy 12/2019 6 mm tubular adenoma and a recall of 7 years - Due for repeat 12/2026  IDA Longstanding history of IDA with negative EGD/colonoscopy/VCE 12/2019, Hgb 10.7  - stable, previously negative evaluation   Nestor Mollie RIGGERS [facility] 06/02/2024, 10:00 AM  Cc: Duwaine Burnard Amble, NP "

## 2024-06-02 ENCOUNTER — Encounter: Payer: Self-pay | Admitting: Gastroenterology

## 2024-06-02 ENCOUNTER — Ambulatory Visit: Admitting: Gastroenterology

## 2024-06-02 VITALS — BP 122/70 | HR 81 | Ht 67.0 in | Wt 201.5 lb

## 2024-06-02 DIAGNOSIS — K5792 Diverticulitis of intestine, part unspecified, without perforation or abscess without bleeding: Secondary | ICD-10-CM

## 2024-06-02 DIAGNOSIS — K5732 Diverticulitis of large intestine without perforation or abscess without bleeding: Secondary | ICD-10-CM | POA: Diagnosis not present

## 2024-06-02 DIAGNOSIS — Z860101 Personal history of adenomatous and serrated colon polyps: Secondary | ICD-10-CM

## 2024-06-02 DIAGNOSIS — Z860100 Personal history of colon polyps, unspecified: Secondary | ICD-10-CM

## 2024-06-02 DIAGNOSIS — D509 Iron deficiency anemia, unspecified: Secondary | ICD-10-CM | POA: Diagnosis not present

## 2024-06-02 DIAGNOSIS — R1030 Lower abdominal pain, unspecified: Secondary | ICD-10-CM

## 2024-06-02 DIAGNOSIS — K5730 Diverticulosis of large intestine without perforation or abscess without bleeding: Secondary | ICD-10-CM

## 2024-06-02 DIAGNOSIS — Z8719 Personal history of other diseases of the digestive system: Secondary | ICD-10-CM

## 2024-06-02 DIAGNOSIS — E119 Type 2 diabetes mellitus without complications: Secondary | ICD-10-CM

## 2024-06-02 DIAGNOSIS — R195 Other fecal abnormalities: Secondary | ICD-10-CM

## 2024-06-02 MED ORDER — NA SULFATE-K SULFATE-MG SULF 17.5-3.13-1.6 GM/177ML PO SOLN: 1.0000 | Freq: Once | ORAL | 0 refills | Status: AC

## 2024-06-02 MED ORDER — DICYCLOMINE HCL 10 MG PO CAPS: 10.0000 mg | ORAL_CAPSULE | Freq: Three times a day (TID) | ORAL | 0 refills | Status: AC

## 2024-06-02 NOTE — Patient Instructions (Signed)
 You have been scheduled for a colonoscopy. Please follow written instructions given to you at your visit today.   If you use inhalers (even only as needed), please bring them with you on the day of your procedure.  DO NOT TAKE 7 DAYS PRIOR TO TEST- Trulicity (dulaglutide) Ozempic, Wegovy (semaglutide) Mounjaro, Zepbound (tirzepatide) Bydureon Bcise (exanatide extended release)  DO NOT TAKE 1 DAY PRIOR TO YOUR TEST Rybelsus (semaglutide) Adlyxin (lixisenatide) Victoza (liraglutide) Byetta (exanatide) ___________________________________________________________________________   Thank you for trusting me with your gastrointestinal care!   Nestor Blower, PA  _______________________________________________________  If your blood pressure at your visit was 140/90 or greater, please contact your primary care physician to follow up on this.  _______________________________________________________  If you are age 60 or older, your body mass index should be between 23-30. Your Body mass index is 31.56 kg/m. If this is out of the aforementioned range listed, please consider follow up with your Primary Care Provider.  If you are age 25 or younger, your body mass index should be between 19-25. Your Body mass index is 31.56 kg/m. If this is out of the aformentioned range listed, please consider follow up with your Primary Care Provider.   ________________________________________________________  The [facility] providers would like to encourage you to use MYCHART to communicate with providers for non-urgent requests or questions.  Due to long hold times on the telephone, sending your provider a message by [facility] may be a faster and more efficient way to get a response.  Please allow 48 business hours for a response.  Please remember that this is for non-urgent requests.  _______________________________________________________  [facility] is using a team-based approach to  care.  Your team is made up of your doctor and two to three APPS. Our APPS (Nurse Practitioners and Physician Assistants) work with your physician to ensure care continuity for you. They are fully qualified to address your health concerns and develop a treatment plan. They communicate directly with your gastroenterologist to care for you. Seeing the Advanced Practice Practitioners on your physician's team can help you by facilitating care more promptly, often allowing for earlier appointments, access to diagnostic testing, procedures, and other specialty referrals.

## 2024-06-10 ENCOUNTER — Telehealth: Admitting: Nurse Practitioner

## 2024-06-10 DIAGNOSIS — J0140 Acute pansinusitis, unspecified: Secondary | ICD-10-CM | POA: Diagnosis not present

## 2024-06-10 MED ORDER — AZITHROMYCIN 250 MG PO TABS: ORAL_TABLET | ORAL | 0 refills | Status: AC

## 2024-06-10 MED ORDER — FLUTICASONE PROPIONATE 50 MCG/ACT NA SUSP: 2.0000 | Freq: Every day | NASAL | 6 refills | Status: AC

## 2024-06-10 NOTE — Progress Notes (Signed)
"      E-Visit for Sinus Problems  We are sorry that you are not feeling well.  Here is how we plan to help!  Based on what you have shared with me it looks like you have sinusitis.  Sinusitis is inflammation and infection in the sinus cavities of the head.  Based on your presentation I believe you most likely have Acute Bacterial Sinusitis.  This is an infection caused by bacteria and is treated with antibiotics. I have prescribed Azithromycin  antibiotic, I have also prescribed Flonase  Nasal Spray Use 2 sprays in each nostril daily for 10-14 days You may use an oral decongestant such as Mucinex  D or if you have glaucoma or high blood pressure use plain Mucinex . Saline nasal spray help and can safely be used as often as needed for congestion.  If you develop worsening sinus pain, fever or notice severe headache and vision changes, or if symptoms are not better after completion of antibiotic, please schedule an appointment with a health care provider.    Sinus infections are not as easily transmitted as other respiratory infection, however we still recommend that you avoid close contact with loved ones, especially the very young and elderly.  Remember to wash your hands thoroughly throughout the day as this is the number one way to prevent the spread of infection!  Home Care: Only take medications as instructed by your medical team. Complete the entire course of an antibiotic. Do not take these medications with alcohol. A steam or ultrasonic humidifier can help congestion.  You can place a towel over your head and breathe in the steam from hot water coming from a faucet. Avoid close contacts especially the very young and the elderly. Cover your mouth when you cough or sneeze. Always remember to wash your hands.  Get Help Right Away If: You develop worsening fever or sinus pain. You develop a severe head ache or visual changes. Your symptoms persist after you have completed your treatment  plan.  Make sure you Understand these instructions. Will watch your condition. Will get help right away if you are not doing well or get worse.  Your e-visit answers were reviewed by a board certified advanced clinical practitioner to complete your personal care plan.  Depending on the condition, your plan could have included both over the counter or prescription medications.  If there is a problem please reply  once you have received a response from your provider.  Your safety is important to us .  If you have drug allergies check your prescription carefully.    You can use MyChart to ask questions about todays visit, request a non-urgent call back, or ask for a work or school excuse for 24 hours related to this e-Visit. If it has been greater than 24 hours you will need to follow up with your provider, or enter a new e-Visit to address those concerns.  You will get an e-mail in the next two days asking about your experience.  I hope that your e-visit has been valuable and will speed your recovery. Thank you for using e-visits.  I have spent 5 minutes in review of e-visit questionnaire, review and updating patient chart, medical decision making and response to patient.   Lauraine Kitty, FNP "

## 2024-06-15 ENCOUNTER — Other Ambulatory Visit (HOSPITAL_BASED_OUTPATIENT_CLINIC_OR_DEPARTMENT_OTHER): Payer: Self-pay | Admitting: Nurse Practitioner

## 2024-06-15 ENCOUNTER — Ambulatory Visit (HOSPITAL_BASED_OUTPATIENT_CLINIC_OR_DEPARTMENT_OTHER)
Admission: RE | Admit: 2024-06-15 | Discharge: 2024-06-15 | Disposition: A | Discharge status: Home / Self Care | Source: Ambulatory Visit | Attending: Nurse Practitioner | Admitting: Nurse Practitioner

## 2024-06-15 DIAGNOSIS — M79671 Pain in right foot: Secondary | ICD-10-CM

## 2024-06-26 ENCOUNTER — Encounter: Payer: Self-pay | Admitting: Orthopaedic Surgery

## 2024-06-26 ENCOUNTER — Other Ambulatory Visit: Payer: Self-pay

## 2024-06-26 DIAGNOSIS — M542 Cervicalgia: Secondary | ICD-10-CM

## 2024-06-26 NOTE — Telephone Encounter (Signed)
 Please order cervical spine MRI.

## 2024-06-26 NOTE — Telephone Encounter (Signed)
MRI order made. They will call her to schedule appt.

## 2024-06-30 ENCOUNTER — Encounter: Payer: Self-pay | Admitting: Orthopaedic Surgery

## 2024-07-01 ENCOUNTER — Encounter: Payer: Self-pay | Admitting: Internal Medicine

## 2024-07-07 ENCOUNTER — Telehealth: Payer: Self-pay | Admitting: Gastroenterology

## 2024-07-07 NOTE — Telephone Encounter (Signed)
 I have spoken to patient who states that she was recently started on ozempic and gabapentin . Most recently took ozempic this past Sunday, 07/05/24. Currently scheduled for colonoscopy 07/08/24. Patient is advised that we will need to reschedule her to a different date. She has been rescheduled to 08/18/24 at 2 pm. She verbalizes understanding.

## 2024-07-07 NOTE — Telephone Encounter (Signed)
 Inbound from patient stating that she was just placed a two new medication called Ozempic 0.025 and Gabapentin . Patient is scheduled for a  procedure tomorrow with Dr. Albertus. Patient is wanting to know if this was ok for her to be taking before the procedure or would she have to reschedule. Patient is requesting a call back. Please advise.

## 2024-07-08 ENCOUNTER — Encounter: Admitting: Internal Medicine

## 2024-07-12 ENCOUNTER — Other Ambulatory Visit

## 2024-07-28 ENCOUNTER — Other Ambulatory Visit

## 2024-08-18 ENCOUNTER — Encounter: Admitting: Internal Medicine
